# Patient Record
Sex: Female | Born: 1960
Health system: Southern US, Community
[De-identification: ages and names within clinical notes are randomized; demographics above are authoritative.]

## PROBLEM LIST (undated history)

## (undated) DIAGNOSIS — G9332 Myalgic encephalomyelitis/chronic fatigue syndrome: Secondary | ICD-10-CM

## (undated) DIAGNOSIS — M1990 Unspecified osteoarthritis, unspecified site: Secondary | ICD-10-CM

## (undated) DIAGNOSIS — K644 Residual hemorrhoidal skin tags: Secondary | ICD-10-CM

## (undated) DIAGNOSIS — K648 Other hemorrhoids: Secondary | ICD-10-CM

## (undated) DIAGNOSIS — G629 Polyneuropathy, unspecified: Secondary | ICD-10-CM

## (undated) DIAGNOSIS — M4800 Spinal stenosis, site unspecified: Secondary | ICD-10-CM

## (undated) DIAGNOSIS — K219 Gastro-esophageal reflux disease without esophagitis: Secondary | ICD-10-CM

## (undated) DIAGNOSIS — K5790 Diverticulosis of intestine, part unspecified, without perforation or abscess without bleeding: Secondary | ICD-10-CM

## (undated) DIAGNOSIS — F419 Anxiety disorder, unspecified: Secondary | ICD-10-CM

## (undated) DIAGNOSIS — I10 Essential (primary) hypertension: Secondary | ICD-10-CM

## (undated) DIAGNOSIS — F32A Depression, unspecified: Secondary | ICD-10-CM

## (undated) DIAGNOSIS — T7840XA Allergy, unspecified, initial encounter: Secondary | ICD-10-CM

## (undated) DIAGNOSIS — R5382 Chronic fatigue, unspecified: Secondary | ICD-10-CM

## (undated) HISTORY — PX: APPENDECTOMY: SHX54

## (undated) HISTORY — DX: Unspecified osteoarthritis, unspecified site: M19.90

## (undated) HISTORY — DX: Other hemorrhoids: K64.8

## (undated) HISTORY — DX: Residual hemorrhoidal skin tags: K64.4

## (undated) HISTORY — DX: Diverticulosis of intestine, part unspecified, without perforation or abscess without bleeding: K57.90

## (undated) HISTORY — DX: Anxiety disorder, unspecified: F41.9

## (undated) HISTORY — DX: Essential (primary) hypertension: I10

## (undated) HISTORY — DX: Myalgic encephalomyelitis/chronic fatigue syndrome: G93.32

## (undated) HISTORY — DX: Allergy, unspecified, initial encounter: T78.40XA

## (undated) HISTORY — DX: Spinal stenosis, site unspecified: M48.00

## (undated) HISTORY — DX: Gastro-esophageal reflux disease without esophagitis: K21.9

## (undated) HISTORY — DX: Chronic fatigue, unspecified: R53.82

## (undated) HISTORY — DX: Depression, unspecified: F32.A

---
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
## 1999-01-26 ENCOUNTER — Emergency Department (HOSPITAL_COMMUNITY): Admission: EM | Admit: 1999-01-26 | Discharge: 1999-01-26 | Payer: Self-pay | Admitting: Emergency Medicine

## 1999-10-12 ENCOUNTER — Other Ambulatory Visit: Admission: RE | Admit: 1999-10-12 | Discharge: 1999-10-12 | Payer: Self-pay | Admitting: *Deleted

## 2000-11-22 ENCOUNTER — Other Ambulatory Visit: Admission: RE | Admit: 2000-11-22 | Discharge: 2000-11-22 | Payer: Self-pay | Admitting: Obstetrics and Gynecology

## 2001-05-12 ENCOUNTER — Ambulatory Visit (HOSPITAL_COMMUNITY): Admission: RE | Admit: 2001-05-12 | Discharge: 2001-05-12 | Payer: Self-pay | Admitting: Family Medicine

## 2001-12-14 ENCOUNTER — Other Ambulatory Visit: Admission: RE | Admit: 2001-12-14 | Discharge: 2001-12-14 | Payer: Self-pay | Admitting: *Deleted

## 2003-01-24 ENCOUNTER — Other Ambulatory Visit: Admission: RE | Admit: 2003-01-24 | Discharge: 2003-01-24 | Payer: Self-pay | Admitting: Internal Medicine

## 2003-04-12 ENCOUNTER — Encounter: Payer: Self-pay | Admitting: Radiology

## 2003-04-12 ENCOUNTER — Encounter: Payer: Self-pay | Admitting: Orthopedic Surgery

## 2003-04-12 ENCOUNTER — Encounter: Admission: RE | Admit: 2003-04-12 | Discharge: 2003-04-12 | Payer: Self-pay | Admitting: Orthopedic Surgery

## 2003-04-29 ENCOUNTER — Encounter: Payer: Self-pay | Admitting: Orthopedic Surgery

## 2003-04-29 ENCOUNTER — Encounter: Admission: RE | Admit: 2003-04-29 | Discharge: 2003-04-29 | Payer: Self-pay | Admitting: Orthopedic Surgery

## 2004-01-30 ENCOUNTER — Other Ambulatory Visit: Admission: RE | Admit: 2004-01-30 | Discharge: 2004-01-30 | Payer: Self-pay | Admitting: Internal Medicine

## 2005-01-18 ENCOUNTER — Other Ambulatory Visit: Admission: RE | Admit: 2005-01-18 | Discharge: 2005-01-18 | Payer: Self-pay | Admitting: Internal Medicine

## 2008-10-23 ENCOUNTER — Encounter (INDEPENDENT_AMBULATORY_CARE_PROVIDER_SITE_OTHER): Payer: Self-pay | Admitting: *Deleted

## 2008-10-23 ENCOUNTER — Ambulatory Visit: Payer: Self-pay | Admitting: Internal Medicine

## 2008-10-23 DIAGNOSIS — K5900 Constipation, unspecified: Secondary | ICD-10-CM | POA: Insufficient documentation

## 2008-11-15 ENCOUNTER — Emergency Department (HOSPITAL_COMMUNITY): Admission: EM | Admit: 2008-11-15 | Discharge: 2008-11-15 | Payer: Self-pay | Admitting: Emergency Medicine

## 2008-11-18 ENCOUNTER — Encounter: Admission: RE | Admit: 2008-11-18 | Discharge: 2008-11-18 | Payer: Self-pay | Admitting: Family Medicine

## 2008-11-29 ENCOUNTER — Ambulatory Visit (HOSPITAL_COMMUNITY): Admission: RE | Admit: 2008-11-29 | Discharge: 2008-11-29 | Payer: Self-pay | Admitting: Neurology

## 2009-10-17 ENCOUNTER — Encounter: Admission: RE | Admit: 2009-10-17 | Discharge: 2009-10-17 | Payer: Self-pay | Admitting: Family Medicine

## 2009-11-28 ENCOUNTER — Encounter (INDEPENDENT_AMBULATORY_CARE_PROVIDER_SITE_OTHER): Payer: Self-pay | Admitting: *Deleted

## 2009-12-15 ENCOUNTER — Encounter (INDEPENDENT_AMBULATORY_CARE_PROVIDER_SITE_OTHER): Payer: Self-pay

## 2009-12-16 ENCOUNTER — Ambulatory Visit: Payer: Self-pay | Admitting: Internal Medicine

## 2009-12-24 ENCOUNTER — Ambulatory Visit: Payer: Self-pay | Admitting: Internal Medicine

## 2010-10-20 NOTE — Letter (Signed)
Summary: [facility] Instructions  [facility]  [address] [place], [place] [address]   Phone: [phone]  Fax: [phone]       TIAJUANA [name]    1961/01/29    MRN: [id number]        Procedure Day Dorna Bloom:  [facility]  12/24/09     Arrival Time:  10:30AM     Procedure Time:  11:30AM     Location of Procedure:                    _X _  [facility] (4th Floor)                        PREPARATION FOR COLONOSCOPY WITH MOVIPREP   Starting 5 days prior to your procedure 12/19/09 do not eat nuts, seeds, popcorn, corn, beans, peas,  salads, or any raw vegetables.  Do not take any fiber supplements (e.g. Metamucil, Citrucel, and Benefiber).  THE DAY BEFORE YOUR PROCEDURE         DATE: 12/23/09 DAY: TUESDAY  1.  Drink clear liquids the entire day-NO SOLID FOOD  2.  Do not drink anything colored red or purple.  Avoid juices with pulp.  No orange juice.  3.  Drink at least 64 oz. (8 glasses) of fluid/clear liquids during the day to prevent dehydration and help the prep work efficiently.  CLEAR LIQUIDS INCLUDE: Water Jello Ice Popsicles Tea (sugar ok, no milk/cream) Powdered fruit flavored drinks Coffee (sugar ok, no milk/cream) Gatorade Juice: apple, white grape, white cranberry  Lemonade Clear bullion, consomm, broth Carbonated beverages (any kind) Strained chicken noodle soup Hard Candy                             4.  In the morning, mix first dose of MoviPrep solution:    Empty 1 Pouch A and 1 Pouch B into the disposable container    Add lukewarm drinking water to the top line of the container. Mix to dissolve    Refrigerate (mixed solution should be used within 24 hrs)  5.  Begin drinking the prep at 5:00 p.m. The MoviPrep container is divided by 4 marks.   Every 15 minutes drink the solution down to the next mark (approximately 8 oz) until the full liter is complete.   6.  Follow completed prep with 16 oz of clear liquid of your choice (Nothing  red or purple).  Continue to drink clear liquids until bedtime.  7.  Before going to bed, mix second dose of MoviPrep solution:    Empty 1 Pouch A and 1 Pouch B into the disposable container    Add lukewarm drinking water to the top line of the container. Mix to dissolve    Refrigerate  THE DAY OF YOUR PROCEDURE      DATE: 12/24/09 DAY: WEDNESDAY  Beginning at 6:30AM (5 hours before procedure):         1. Every 15 minutes, drink the solution down to the next mark (approx 8 oz) until the full liter is complete.  2. Follow completed prep with 16 oz. of clear liquid of your choice.    3. You may drink clear liquids until 9:30AM (2 HOURS BEFORE PROCEDURE).   MEDICATION INSTRUCTIONS  Unless otherwise instructed, you should take regular prescription medications with a small sip of water   as early as possible the morning of your  procedure.    Additional medication instructions:Do not take diurectic-  HCTZ am of procedure.         OTHER INSTRUCTIONS  You will need a responsible adult at least 52 years of age to accompany you and drive you home.   This person must remain in the waiting room during your procedure.  Wear loose fitting clothing that is easily removed.  Leave jewelry and other valuables at home.  However, you may wish to bring a book to read or  an iPod/MP3 player to listen to music as you wait for your procedure to start.  Remove all body piercing jewelry and leave at home.  Total time from sign-in until discharge is approximately 2-3 hours.  You should go home directly after your procedure and rest.  You can resume normal activities the  day after your procedure.  The day of your procedure you should not:   Drive   Make legal decisions   Operate machinery   Drink alcohol   Return to work  You will receive specific instructions about eating, activities and medications before you leave.    The above instructions have been reviewed and explained to  me by   Ulis Rias RN  December 16, 2009 9:02 AM     I fully understand and can verbalize these instructions _____________________________ Date _________

## 2010-10-20 NOTE — Letter (Signed)
Summary: OSP Based Product Consent Form  [facility]  [address] [place], [place] [address]   Phone: [phone]  Fax: [phone]      October 23, 2008 MRN: [id number] Tammy [name]  [facility]  OSP BASED PRODUCT CONSENT FORM  I understand that in order to prepare myself for an examination of my large bowel during a colonoscpoy I must undergo a bowel cleansing prior to the procedure. This is to ensure that my bowel is as clean as possible so the physician performing the procedure can thoroughly examine the lining of my colon to detect polyps or other abnormalities.  I have beeen informed that the FDA has become aware of reports of acute phosphate nephropathy, a type of acute kidney injury, associated with the use of oral sodium phosphate based products (OPS) for bowel cleansing prior to colonoscopy and other procedures. These rare but serious events have occurred in some patients wothout identifible factors that would put them at risk for developing acute kidney injury. These products include the prescription products, OsmoPrep and Visicol, as well as previously available over-the-counter laxatives (e.g., Fleet Phospho-soda).  Prior caution should be taken in patients with a history of renal insufficiency, advanced age, or those taking diuretics or certain blood pressure medications. The use of OSP is contraindicated in patients with advanced renal disease or a history of heart failure.  Alternative preparations for bowel cleansing recommended by my physician include: MoviPrep, Golytely, HalfLytely, Miralax, or other PEG (polyethylene glycol) based products which, to date, have not been associated with these types od adverse events.  The above risks and alternatives have been fully expained to me and I am aware that by taking OsmoPrep, or like products, an acute kidney injury may occur without warning. Despite this warning, I have chosen to use a phosphate-based  regime for bowel cleansing prior to my colonoscopy and will assume responsibility for any adverse effect that I may experience from this product. I also understand the importance of vigorous hydration before and immediately following my procedure.     Instructed by: Francee Piccolo, CMA     Signed:________________________________________   Date:_______________   Original 11/21/07  Appended Document: OSP Based Product Consent Form Pt decided not to use OsmoPrep for her colon prep.  Consent not signed and original discarded in shredder.

## 2010-10-20 NOTE — Miscellaneous (Signed)
Summary: Lec previsit  Clinical Lists Changes  Medications: Added new medication of MOVIPREP 100 GM  SOLR (PEG-KCL-NACL-NASULF-NA ASC-C) As per prep instructions. - Signed Rx of MOVIPREP 100 GM  SOLR (PEG-KCL-NACL-NASULF-NA ASC-C) As per prep instructions.;  #1 x 0;  Signed;  Entered by: Ulis Rias RN;  Authorized by: Iva Boop MD, Clementeen Graham;  Method used: Electronically to [facility] [address], [address] Shipping/mailing, [place], [place]  [address], Ph: [phone], Fax: [phone] Observations: Added new observation of NKA: T (12/16/2009 8:49)    Prescriptions: MOVIPREP 100 GM  SOLR (PEG-KCL-NACL-NASULF-NA ASC-C) As per prep instructions.  #1 x 0   Entered by:   Ulis Rias RN   Authorized by:   Iva Boop MD, [facility]   Signed by:   Ulis Rias RN on 12/16/2009   Method used:   Electronically to        [facility]* (retail)       [address]       [address] Shipping/mailing       [place], [place]  [address]       Ph: [phone]       Fax: [phone]   RxID:   [id number]

## 2010-10-20 NOTE — Procedures (Signed)
Summary: Colonoscopy  Patient: [name] Note: All result statuses are Final unless otherwise noted.  Tests: (1) Colonoscopy (COL)   COL Colonoscopy           DONE     [facility]     [address]     [place], [place]  [address]           COLONOSCOPY PROCEDURE REPORT           PATIENT:  Tammy [name], Tammy [name]  MR#:  [id number]     BIRTHDATE:  08-07-1961, 48 yrs. old  GENDER:  female     ENDOSCOPIST:  Iva Boop, MD, [facility]     REF. BY:  Herb Grays, M.D.     PROCEDURE DATE:  12/24/2009     PROCEDURE:  Colonoscopy 06[address]     ASA CLASS:  Class I     INDICATIONS:  Routine Risk Screening She did have a diminutive     rectalpolyp removed in 2006, no records - apparently not     precancerous     MEDICATIONS:   Fentanyl 50 mcg IV, Versed 6 mg IV           DESCRIPTION OF PROCEDURE:   After the risks benefits and     alternatives of the procedure were thoroughly explained, informed     consent was obtained.  Digital rectal exam was performed and     revealed no abnormalities.   The LB CF-[id number] endoscope     was introduced through the anus and advanced to the cecum, which     was identified by both the appendix and ileocecal valve, without     limitations.  The quality of the prep was excellent, using     MoviPrep.  The instrument was then slowly withdrawn as the colon     was fully examined.     Insertion: 5:16 minutes Withdrawal: 7:48 minutes     <<PROCEDUREIMAGES>>           FINDINGS:  A normal appearing cecum, ileocecal valve, and     appendiceal orifice were identified. The ascending, hepatic     flexure, transverse, splenic flexure, descending, sigmoid colon,     and rectum appeared unremarkable.   Retroflexed views in the     rectum revealed no abnormalities.    The scope was then withdrawn     from the patient and the procedure completed.           COMPLICATIONS:  None     ENDOSCOPIC IMPRESSION:     1) Normal colonoscopy - excellent prep           REPEAT  EXAM:  In 10 year(s) for routine screening colonocsopy.           Iva Boop, MD, Clementeen Graham           CC:  Herb Grays, MD     The Patient           n.     Rosalie Doctor:   Iva Boop at 12/24/2009 12:11 PM           [place], Camelia Eng, [id number]  Note: An exclamation mark (!) indicates a result that was not dispersed into the flowsheet. Document Creation Date: 12/24/2009 12:12 PM _______________________________________________________________________  (1) Order result status: Final Collection or observation date-time: 12/24/2009 12:00 Requested date-time:  Receipt date-time:  Reported date-time:  Referring Physician:   Ordering Physician: Stan Head [phone]) Specimen Source:  Source: Kem Parkinson  Filler Order Number: [id number] Lab site:   Appended Document: Colonoscopy    Clinical Lists Changes  Observations: Added new observation of COLONNXTDUE: 12/2019 (12/24/2009 14:50)

## 2010-10-20 NOTE — Letter (Signed)
Summary: Previsit letter  [facility]  [address] [place], [place] [address]   Phone: [phone]  Fax: [phone]       11/28/2009 MRN: [id number]  Tammy [name] [address] [place], [place]  [address]  Dear Tammy [name],  Welcome to the Gastroenterology Division at [facility].    You are scheduled to see a nurse for your pre-procedure visit on 12-16-09 at 8:30a.m. on the 3rd floor at [facility], [address]  We ask that you try to arrive at our office 15 minutes prior to your appointment time to allow for check-in.  Your nurse visit will consist of discussing your medical and surgical history, your immediate family medical history, and your medications.    Please bring a complete list of all your medications or, if you prefer, bring the medication bottles and we will list them.  We will need to be aware of both prescribed and over the counter drugs.  We will need to know exact dosage information as well.  If you are on blood thinners (Coumadin, Plavix, Aggrenox, Ticlid, etc.) please call our office today/prior to your appointment, as we need to consult with your physician about holding your medication.   Please be prepared to read and sign documents such as consent forms, a financial agreement, and acknowledgement forms.  If necessary, and with your consent, a friend or relative is welcome to sit-in on the nurse visit with you.  Please bring your insurance card so that we may make a copy of it.  If your insurance requires a referral to see a specialist, please bring your referral form from your primary care physician.  No co-pay is required for this nurse visit.     If you cannot keep your appointment, please call [phone] to cancel or reschedule prior to your appointment date.  This allows [place] the opportunity to schedule an appointment for another patient in need of care.    Thank you for choosing [facility] for your medical  needs.  We appreciate the opportunity to care for you.  Please visit [place] at our website  to learn more about our practice.                     Sincerely.                                                                                                                   The Gastroenterology Division

## 2010-10-20 NOTE — Assessment & Plan Note (Signed)
Summary: CONSTIPATION/WANTS A COL...AS.   History of Present Illness Visit Type: new patient Primary GI MD: Stan Head MD [facility] Primary Provider: Reed Breech, MD Chief Complaint: colon cancer screening History of Present Illness:   50 yo white woman with chronic constipation that probbably worsened a bit when on Aleve recently (after dental surgery). Uses glycerin suppository every other day with some relief. Not really a new problem. HHad a colonoscopy > 5 yrs ago and polyps removed but she indicates not precancerous. All other GI ROS negative.            Prior Medications Reviewed Using: Patient Recall  Updated Prior Medication List: HYDROCHLOROTHIAZIDE 25 MG TABS (HYDROCHLOROTHIAZIDE) once daily AMBIEN 5 MG TABS (ZOLPIDEM TARTRATE) at bedtime  Current Allergies: No known allergies  Past Medical History:    Depression    Hyperlipidemia    Urinary Tract Infection  Past Surgical History:    Tonsillectomy   Family History:    Family History of Colon Polyps:FAther    Family History of Diabetes: Grandmother  Social History:    Occupation: Child psychotherapist, [employer]    Husband recently unemployed and there are financial stresses    1 son 1 daughter     Patient has never smoked.     Alcohol Use - no    Illicit Drug Use - no    Patient gets regular exercise.  Risk Factors:  Tobacco use:  never Drug use:  no Alcohol use:  no Exercise:  yes  Review of Systems       The patient complains of sleeping problems.    Vital Signs:  Patient Profile:   50 Years Old Female Height:     63 inches Weight:      177.50 pounds                  Physical Exam  General:     Well developed, well nourished, no acute distress. Neck:     Supple; no masses or thyromegaly. Lungs:     Clear throughout to auscultation. Heart:     Regular rate and rhythm; no murmurs, rubs,  or bruits. Abdomen:     Soft, nontender and nondistended. No masses, hepatosplenomegaly or hernias  noted. Normal bowel sounds. Rectal:     deferred until time of colonoscopy.   Neurologic:     Alert and  oriented x4;     Impression & Recommendations:  Problem # 1:  SCREENING COLORECTAL-CANCER (ICD-V76.51) Risks, benefits,and indications of endoscopic procedure(s) were reviewed with the patient and all questions answered.  Orders: Colonoscopy (Colon)   Problem # 2:  CONSTIPATION (ICD-564.00) Assessment: Unchanged Chronic, glycerine suppositories work but to try MiraLax   Patient Instructions: 1)  Try MiraLax daily for chronic constipation. 2)  Mix it well, stirrng x 1 minute. 3)  Do not take HCTZ for 5 days prior to colonoscopy if you use OsmoPrep.  Prescriptions: DULCOLAX 5 MG  TBEC (BISACODYL) Day before procedure take 2 at 3pm and 2 at 8pm.  #4 x 0   Entered by:   Francee Piccolo CMA   Authorized by:   Iva Boop MD   Signed by:   Francee Piccolo CMA on 10/23/2008   Method used:   Electronically to        [facility] (retail)       [address]       [place], [place]  [address]  Ph: [phone]       Fax: [phone]   RxID:   [id number] METOCLOPRAMIDE HCL 10 MG  TABS (METOCLOPRAMIDE HCL) As per prep instructions.  #2 x 0   Entered by:   Francee Piccolo CMA   Authorized by:   Iva Boop MD   Signed by:   Francee Piccolo CMA on 10/23/2008   Method used:   Electronically to        [facility] (retail)       [address]       [place], [place]  [address]       Ph: [phone]       Fax: [phone]   RxID:   [id number] MIRALAX   POWD (POLYETHYLENE GLYCOL 3350) As per prep  instructions.  #255gm x 0   Entered by:   Francee Piccolo CMA   Authorized by:   Iva Boop MD   Signed by:   Francee Piccolo CMA on 10/23/2008   Method used:   Electronically to        [facility] ([facility])       [address]       [place], [place]  [address]       Ph: [phone]       Fax: [phone]   RxID:   [id number]

## 2010-12-08 ENCOUNTER — Other Ambulatory Visit: Payer: Self-pay | Admitting: Obstetrics and Gynecology

## 2010-12-08 DIAGNOSIS — Z1231 Encounter for screening mammogram for malignant neoplasm of breast: Secondary | ICD-10-CM

## 2010-12-09 ENCOUNTER — Other Ambulatory Visit (HOSPITAL_COMMUNITY): Payer: Self-pay | Admitting: Neurological Surgery

## 2010-12-09 DIAGNOSIS — R200 Anesthesia of skin: Secondary | ICD-10-CM

## 2010-12-09 DIAGNOSIS — IMO0002 Reserved for concepts with insufficient information to code with codable children: Secondary | ICD-10-CM

## 2010-12-11 ENCOUNTER — Ambulatory Visit: Payer: Self-pay

## 2010-12-16 ENCOUNTER — Other Ambulatory Visit (HOSPITAL_COMMUNITY): Payer: Self-pay

## 2010-12-17 ENCOUNTER — Ambulatory Visit (HOSPITAL_COMMUNITY)
Admission: RE | Admit: 2010-12-17 | Discharge: 2010-12-17 | Disposition: A | Discharge status: Home / Self Care | Payer: 59 | Source: Ambulatory Visit | Attending: Neurological Surgery | Admitting: Neurological Surgery

## 2010-12-17 DIAGNOSIS — M47814 Spondylosis without myelopathy or radiculopathy, thoracic region: Secondary | ICD-10-CM | POA: Insufficient documentation

## 2010-12-17 DIAGNOSIS — M5030 Other cervical disc degeneration, unspecified cervical region: Secondary | ICD-10-CM | POA: Insufficient documentation

## 2010-12-17 DIAGNOSIS — M47812 Spondylosis without myelopathy or radiculopathy, cervical region: Secondary | ICD-10-CM | POA: Insufficient documentation

## 2010-12-17 DIAGNOSIS — R200 Anesthesia of skin: Secondary | ICD-10-CM

## 2010-12-17 DIAGNOSIS — R51 Headache: Secondary | ICD-10-CM | POA: Insufficient documentation

## 2010-12-17 DIAGNOSIS — IMO0002 Reserved for concepts with insufficient information to code with codable children: Secondary | ICD-10-CM | POA: Insufficient documentation

## 2010-12-17 MED ORDER — GADOBENATE DIMEGLUMINE 529 MG/ML IV SOLN
13.0000 mL | Freq: Once | INTRAVENOUS | Status: AC
  Administered 2010-12-17: 13 mL via INTRAVENOUS

## 2010-12-21 ENCOUNTER — Ambulatory Visit: Payer: Self-pay

## 2011-01-05 LAB — CBC
HCT: 39.9 % (ref 36.0–46.0)
Hemoglobin: 13.6 g/dL (ref 12.0–15.0)
MCHC: 34.2 g/dL (ref 30.0–36.0)
MCV: 83 fL (ref 78.0–100.0)
Platelets: 217 10*3/uL (ref 150–400)
RBC: 4.81 MIL/uL (ref 3.87–5.11)
RDW: 13.1 % (ref 11.5–15.5)
WBC: 5.5 10*3/uL (ref 4.0–10.5)

## 2011-01-05 LAB — DIFFERENTIAL
Basophils Absolute: 0 10*3/uL (ref 0.0–0.1)
Basophils Relative: 0 % (ref 0–1)
Eosinophils Absolute: 0.1 10*3/uL (ref 0.0–0.7)
Eosinophils Relative: 1 % (ref 0–5)
Lymphocytes Relative: 17 % (ref 12–46)
Lymphs Abs: 1 10*3/uL (ref 0.7–4.0)
Monocytes Absolute: 0.4 10*3/uL (ref 0.1–1.0)
Monocytes Relative: 8 % (ref 3–12)
Neutro Abs: 4 10*3/uL (ref 1.7–7.7)
Neutrophils Relative %: 74 % (ref 43–77)

## 2011-01-05 LAB — COMPREHENSIVE METABOLIC PANEL
ALT: 18 U/L (ref 0–35)
AST: 20 U/L (ref 0–37)
Albumin: 4.5 g/dL (ref 3.5–5.2)
Alkaline Phosphatase: 39 U/L (ref 39–117)
BUN: 11 mg/dL (ref 6–23)
CO2: 25 mEq/L (ref 19–32)
Calcium: 9.4 mg/dL (ref 8.4–10.5)
Chloride: 100 mEq/L (ref 96–112)
Creatinine, Ser: 0.72 mg/dL (ref 0.4–1.2)
GFR calc Af Amer: >60 mL/min (ref >60)
GFR calc non Af Amer: >60 mL/min (ref >60)
Glucose, Bld: 92 mg/dL (ref 70–99)
Potassium: 3.2 mEq/L — ABNORMAL LOW (ref 3.5–5.1)
Sodium: 136 mEq/L (ref 135–145)
Total Bilirubin: 0.9 mg/dL (ref 0.3–1.2)
Total Protein: 6.4 g/dL (ref 6.0–8.3)

## 2011-01-21 ENCOUNTER — Ambulatory Visit: Payer: Commercial Managed Care - PPO

## 2011-01-26 ENCOUNTER — Ambulatory Visit
Admission: RE | Admit: 2011-01-26 | Discharge: 2011-01-26 | Disposition: A | Discharge status: Home / Self Care | Payer: 59 | Source: Ambulatory Visit | Attending: Obstetrics and Gynecology | Admitting: Obstetrics and Gynecology

## 2011-01-26 DIAGNOSIS — Z1231 Encounter for screening mammogram for malignant neoplasm of breast: Secondary | ICD-10-CM

## 2011-01-27 ENCOUNTER — Other Ambulatory Visit: Payer: Self-pay | Admitting: Obstetrics and Gynecology

## 2011-01-27 DIAGNOSIS — R928 Other abnormal and inconclusive findings on diagnostic imaging of breast: Secondary | ICD-10-CM

## 2011-01-29 ENCOUNTER — Ambulatory Visit
Admission: RE | Admit: 2011-01-29 | Discharge: 2011-01-29 | Disposition: A | Discharge status: Home / Self Care | Payer: 59 | Source: Ambulatory Visit | Attending: Obstetrics and Gynecology | Admitting: Obstetrics and Gynecology

## 2011-01-29 DIAGNOSIS — R928 Other abnormal and inconclusive findings on diagnostic imaging of breast: Secondary | ICD-10-CM

## 2011-11-02 ENCOUNTER — Other Ambulatory Visit (HOSPITAL_COMMUNITY): Payer: Self-pay | Admitting: Neurological Surgery

## 2011-11-02 DIAGNOSIS — M47816 Spondylosis without myelopathy or radiculopathy, lumbar region: Secondary | ICD-10-CM

## 2011-11-02 DIAGNOSIS — M47814 Spondylosis without myelopathy or radiculopathy, thoracic region: Secondary | ICD-10-CM

## 2011-11-05 ENCOUNTER — Ambulatory Visit (HOSPITAL_COMMUNITY): Admission: RE | Admit: 2011-11-05 | Payer: 59 | Source: Ambulatory Visit

## 2011-11-05 ENCOUNTER — Ambulatory Visit (HOSPITAL_COMMUNITY)
Admission: RE | Admit: 2011-11-05 | Discharge: 2011-11-05 | Disposition: A | Discharge status: Home / Self Care | Payer: 59 | Source: Ambulatory Visit | Attending: Neurological Surgery | Admitting: Neurological Surgery

## 2011-11-05 DIAGNOSIS — M25519 Pain in unspecified shoulder: Secondary | ICD-10-CM | POA: Insufficient documentation

## 2011-11-05 DIAGNOSIS — M79609 Pain in unspecified limb: Secondary | ICD-10-CM | POA: Insufficient documentation

## 2011-11-05 DIAGNOSIS — R209 Unspecified disturbances of skin sensation: Secondary | ICD-10-CM | POA: Insufficient documentation

## 2011-11-05 DIAGNOSIS — M4804 Spinal stenosis, thoracic region: Secondary | ICD-10-CM | POA: Insufficient documentation

## 2011-11-05 DIAGNOSIS — M47816 Spondylosis without myelopathy or radiculopathy, lumbar region: Secondary | ICD-10-CM

## 2011-11-05 DIAGNOSIS — M47817 Spondylosis without myelopathy or radiculopathy, lumbosacral region: Secondary | ICD-10-CM | POA: Insufficient documentation

## 2011-11-05 DIAGNOSIS — M47814 Spondylosis without myelopathy or radiculopathy, thoracic region: Secondary | ICD-10-CM

## 2011-11-05 DIAGNOSIS — M519 Unspecified thoracic, thoracolumbar and lumbosacral intervertebral disc disorder: Secondary | ICD-10-CM | POA: Insufficient documentation

## 2012-03-17 ENCOUNTER — Other Ambulatory Visit (HOSPITAL_COMMUNITY)
Admission: RE | Admit: 2012-03-17 | Discharge: 2012-03-17 | Disposition: A | Discharge status: Home / Self Care | Payer: 59 | Source: Ambulatory Visit | Attending: Internal Medicine | Admitting: Internal Medicine

## 2012-03-17 ENCOUNTER — Other Ambulatory Visit: Payer: Self-pay | Admitting: Internal Medicine

## 2012-03-17 DIAGNOSIS — N6311 Unspecified lump in the right breast, upper outer quadrant: Secondary | ICD-10-CM

## 2012-03-17 DIAGNOSIS — Z01419 Encounter for gynecological examination (general) (routine) without abnormal findings: Secondary | ICD-10-CM | POA: Insufficient documentation

## 2012-03-24 ENCOUNTER — Ambulatory Visit
Admission: RE | Admit: 2012-03-24 | Discharge: 2012-03-24 | Disposition: A | Discharge status: Home / Self Care | Payer: 59 | Source: Ambulatory Visit | Attending: Internal Medicine | Admitting: Internal Medicine

## 2012-03-24 DIAGNOSIS — N6311 Unspecified lump in the right breast, upper outer quadrant: Secondary | ICD-10-CM

## 2012-04-04 ENCOUNTER — Other Ambulatory Visit (HOSPITAL_COMMUNITY): Payer: Self-pay | Admitting: Orthopedic Surgery

## 2012-04-04 DIAGNOSIS — M542 Cervicalgia: Secondary | ICD-10-CM

## 2012-04-06 ENCOUNTER — Ambulatory Visit (HOSPITAL_COMMUNITY)
Admission: RE | Admit: 2012-04-06 | Discharge: 2012-04-06 | Disposition: A | Discharge status: Home / Self Care | Payer: 59 | Source: Ambulatory Visit | Attending: Orthopedic Surgery | Admitting: Orthopedic Surgery

## 2012-04-06 DIAGNOSIS — M542 Cervicalgia: Secondary | ICD-10-CM | POA: Insufficient documentation

## 2012-04-06 DIAGNOSIS — M79609 Pain in unspecified limb: Secondary | ICD-10-CM | POA: Insufficient documentation

## 2012-04-21 ENCOUNTER — Ambulatory Visit: Payer: 59 | Attending: Orthopedic Surgery | Admitting: Physical Therapy

## 2012-04-21 DIAGNOSIS — IMO0001 Reserved for inherently not codable concepts without codable children: Secondary | ICD-10-CM | POA: Insufficient documentation

## 2012-04-21 DIAGNOSIS — M6281 Muscle weakness (generalized): Secondary | ICD-10-CM | POA: Insufficient documentation

## 2012-04-21 DIAGNOSIS — M542 Cervicalgia: Secondary | ICD-10-CM | POA: Insufficient documentation

## 2012-04-21 DIAGNOSIS — M25519 Pain in unspecified shoulder: Secondary | ICD-10-CM | POA: Insufficient documentation

## 2012-04-26 ENCOUNTER — Ambulatory Visit: Payer: 59 | Admitting: Physical Therapy

## 2012-05-03 ENCOUNTER — Ambulatory Visit: Payer: 59 | Admitting: Physical Therapy

## 2012-05-05 ENCOUNTER — Ambulatory Visit: Payer: 59 | Admitting: Physical Therapy

## 2012-05-10 ENCOUNTER — Ambulatory Visit: Payer: 59 | Admitting: Physical Therapy

## 2012-05-11 ENCOUNTER — Ambulatory Visit: Payer: 59

## 2012-05-15 ENCOUNTER — Ambulatory Visit: Payer: 59

## 2012-05-17 ENCOUNTER — Ambulatory Visit: Payer: 59 | Admitting: Physical Therapy

## 2012-05-17 ENCOUNTER — Encounter: Payer: 59 | Admitting: Physical Therapy

## 2012-05-25 ENCOUNTER — Ambulatory Visit: Payer: 59 | Attending: Orthopedic Surgery

## 2012-05-25 DIAGNOSIS — M25519 Pain in unspecified shoulder: Secondary | ICD-10-CM | POA: Insufficient documentation

## 2012-05-25 DIAGNOSIS — IMO0001 Reserved for inherently not codable concepts without codable children: Secondary | ICD-10-CM | POA: Insufficient documentation

## 2012-05-25 DIAGNOSIS — M6281 Muscle weakness (generalized): Secondary | ICD-10-CM | POA: Insufficient documentation

## 2012-05-25 DIAGNOSIS — M542 Cervicalgia: Secondary | ICD-10-CM | POA: Insufficient documentation

## 2012-05-29 ENCOUNTER — Ambulatory Visit: Payer: 59

## 2012-05-31 ENCOUNTER — Ambulatory Visit: Payer: 59 | Admitting: Physical Therapy

## 2012-06-05 ENCOUNTER — Ambulatory Visit: Payer: 59

## 2012-06-07 ENCOUNTER — Ambulatory Visit: Payer: 59

## 2012-06-12 ENCOUNTER — Ambulatory Visit: Payer: 59

## 2012-06-14 ENCOUNTER — Ambulatory Visit: Payer: 59 | Admitting: Physical Therapy

## 2012-06-19 ENCOUNTER — Ambulatory Visit: Payer: 59

## 2012-06-21 ENCOUNTER — Ambulatory Visit: Payer: 59 | Attending: Orthopedic Surgery

## 2012-06-21 DIAGNOSIS — IMO0001 Reserved for inherently not codable concepts without codable children: Secondary | ICD-10-CM | POA: Insufficient documentation

## 2012-06-21 DIAGNOSIS — M25519 Pain in unspecified shoulder: Secondary | ICD-10-CM | POA: Insufficient documentation

## 2012-06-21 DIAGNOSIS — M6281 Muscle weakness (generalized): Secondary | ICD-10-CM | POA: Insufficient documentation

## 2012-06-21 DIAGNOSIS — M542 Cervicalgia: Secondary | ICD-10-CM | POA: Insufficient documentation

## 2012-06-28 ENCOUNTER — Ambulatory Visit: Payer: 59

## 2012-07-05 ENCOUNTER — Ambulatory Visit: Payer: 59 | Admitting: Physical Therapy

## 2012-07-12 ENCOUNTER — Ambulatory Visit: Payer: 59 | Admitting: Physical Therapy

## 2012-07-12 ENCOUNTER — Other Ambulatory Visit: Payer: Self-pay | Admitting: Orthopedic Surgery

## 2012-07-12 ENCOUNTER — Ambulatory Visit: Payer: 59

## 2012-07-12 DIAGNOSIS — R531 Weakness: Secondary | ICD-10-CM

## 2012-07-12 DIAGNOSIS — M4800 Spinal stenosis, site unspecified: Secondary | ICD-10-CM

## 2012-07-19 ENCOUNTER — Ambulatory Visit
Admission: RE | Admit: 2012-07-19 | Discharge: 2012-07-19 | Disposition: A | Discharge status: Home / Self Care | Payer: 59 | Source: Ambulatory Visit | Attending: Orthopedic Surgery | Admitting: Orthopedic Surgery

## 2012-07-19 ENCOUNTER — Ambulatory Visit: Payer: 59 | Admitting: Physical Therapy

## 2012-07-19 DIAGNOSIS — M4800 Spinal stenosis, site unspecified: Secondary | ICD-10-CM

## 2012-07-19 DIAGNOSIS — R531 Weakness: Secondary | ICD-10-CM

## 2012-07-19 MED ORDER — IOHEXOL 300 MG/ML  SOLN
1.0000 mL | Freq: Once | INTRAMUSCULAR | Status: AC | PRN
  Administered 2012-07-19: 1 mL via EPIDURAL

## 2012-07-19 MED ORDER — TRIAMCINOLONE ACETONIDE 40 MG/ML IJ SUSP (RADIOLOGY)
60.0000 mg | Freq: Once | INTRAMUSCULAR | Status: AC
  Administered 2012-07-19: 60 mg via EPIDURAL

## 2012-07-24 ENCOUNTER — Other Ambulatory Visit: Payer: Self-pay | Admitting: Orthopedic Surgery

## 2012-07-24 DIAGNOSIS — M549 Dorsalgia, unspecified: Secondary | ICD-10-CM

## 2012-07-26 ENCOUNTER — Ambulatory Visit: Payer: 59

## 2012-08-09 ENCOUNTER — Ambulatory Visit
Admission: RE | Admit: 2012-08-09 | Discharge: 2012-08-09 | Disposition: A | Discharge status: Home / Self Care | Payer: 59 | Source: Ambulatory Visit | Attending: Orthopedic Surgery | Admitting: Orthopedic Surgery

## 2012-08-09 DIAGNOSIS — M549 Dorsalgia, unspecified: Secondary | ICD-10-CM

## 2012-08-09 MED ORDER — METHYLPREDNISOLONE ACETATE 40 MG/ML INJ SUSP (RADIOLOG
120.0000 mg | Freq: Once | INTRAMUSCULAR | Status: AC
  Administered 2012-08-09: 120 mg via EPIDURAL

## 2012-08-09 MED ORDER — IOHEXOL 180 MG/ML  SOLN
1.0000 mL | Freq: Once | INTRAMUSCULAR | Status: AC | PRN
  Administered 2012-08-09: 1 mL via EPIDURAL

## 2012-08-16 ENCOUNTER — Telehealth: Payer: Self-pay | Admitting: Radiology

## 2012-08-16 NOTE — Telephone Encounter (Signed)
Explained twitching in her legs was not a usual side effect of the steroid injections and could sometimes be associated with imbalances of K+, sodium or even calcium. Pt states she was just put on K+ tablets. I asked her to call the ordering physician about her symptoms.   dd

## 2012-09-17 ENCOUNTER — Emergency Department (HOSPITAL_COMMUNITY): Payer: 59

## 2012-09-17 ENCOUNTER — Encounter (HOSPITAL_COMMUNITY): Payer: Self-pay | Admitting: Family Medicine

## 2012-09-17 ENCOUNTER — Observation Stay (HOSPITAL_COMMUNITY)
Admission: EM | Admit: 2012-09-17 | Discharge: 2012-09-19 | Disposition: A | Discharge status: Home / Self Care | Payer: 59 | Attending: Internal Medicine | Admitting: Internal Medicine

## 2012-09-17 DIAGNOSIS — K5900 Constipation, unspecified: Secondary | ICD-10-CM

## 2012-09-17 DIAGNOSIS — E876 Hypokalemia: Secondary | ICD-10-CM | POA: Insufficient documentation

## 2012-09-17 DIAGNOSIS — R0989 Other specified symptoms and signs involving the circulatory and respiratory systems: Secondary | ICD-10-CM | POA: Insufficient documentation

## 2012-09-17 DIAGNOSIS — E785 Hyperlipidemia, unspecified: Secondary | ICD-10-CM | POA: Insufficient documentation

## 2012-09-17 DIAGNOSIS — G589 Mononeuropathy, unspecified: Secondary | ICD-10-CM | POA: Insufficient documentation

## 2012-09-17 DIAGNOSIS — I10 Essential (primary) hypertension: Secondary | ICD-10-CM | POA: Insufficient documentation

## 2012-09-17 DIAGNOSIS — R0609 Other forms of dyspnea: Secondary | ICD-10-CM | POA: Insufficient documentation

## 2012-09-17 DIAGNOSIS — R079 Chest pain, unspecified: Principal | ICD-10-CM | POA: Diagnosis present

## 2012-09-17 DIAGNOSIS — G629 Polyneuropathy, unspecified: Secondary | ICD-10-CM

## 2012-09-17 HISTORY — DX: Polyneuropathy, unspecified: G62.9

## 2012-09-17 LAB — CBC WITH DIFFERENTIAL/PLATELET
Basophils Absolute: 0 10*3/uL (ref 0.0–0.1)
Basophils Relative: 1 % (ref 0–1)
Eosinophils Absolute: 0.1 10*3/uL (ref 0.0–0.7)
Eosinophils Relative: 2 % (ref 0–5)
HCT: 39 % (ref 36.0–46.0)
Hemoglobin: 12.9 g/dL (ref 12.0–15.0)
Lymphocytes Relative: 18 % (ref 12–46)
Lymphs Abs: 0.7 10*3/uL (ref 0.7–4.0)
MCH: 27.7 pg (ref 26.0–34.0)
MCHC: 33.1 g/dL (ref 30.0–36.0)
MCV: 83.9 fL (ref 78.0–100.0)
Monocytes Absolute: 0.2 10*3/uL (ref 0.1–1.0)
Monocytes Relative: 6 % (ref 3–12)
Neutro Abs: 3 10*3/uL (ref 1.7–7.7)
Neutrophils Relative %: 74 % (ref 43–77)
Platelets: 196 10*3/uL (ref 150–400)
RBC: 4.65 MIL/uL (ref 3.87–5.11)
RDW: 13 % (ref 11.5–15.5)
WBC: 4.1 10*3/uL (ref 4.0–10.5)

## 2012-09-17 LAB — CK: Total CK: 39 U/L (ref 7–177)

## 2012-09-17 LAB — URINALYSIS, ROUTINE W REFLEX MICROSCOPIC
Bilirubin Urine: NEGATIVE
Glucose, UA: NEGATIVE mg/dL
Hgb urine dipstick: NEGATIVE
Ketones, ur: NEGATIVE mg/dL
Nitrite: NEGATIVE
Protein, ur: NEGATIVE mg/dL
Specific Gravity, Urine: 1.006 (ref 1.005–1.030)
Urobilinogen, UA: 0.2 mg/dL (ref 0.0–1.0)
pH: 7.5 (ref 5.0–8.0)

## 2012-09-17 LAB — COMPREHENSIVE METABOLIC PANEL
ALT: 16 U/L (ref 0–35)
AST: 20 U/L (ref 0–37)
Albumin: 4.2 g/dL (ref 3.5–5.2)
Alkaline Phosphatase: 45 U/L (ref 39–117)
BUN: 12 mg/dL (ref 6–23)
CO2: 26 mEq/L (ref 19–32)
Calcium: 10 mg/dL (ref 8.4–10.5)
Chloride: 97 mEq/L (ref 96–112)
Creatinine, Ser: 0.67 mg/dL (ref 0.50–1.10)
GFR calc Af Amer: >90 mL/min (ref >90)
GFR calc non Af Amer: >90 mL/min (ref >90)
Glucose, Bld: 142 mg/dL — ABNORMAL HIGH (ref 70–99)
Potassium: 2.8 mEq/L — ABNORMAL LOW (ref 3.5–5.1)
Sodium: 137 mEq/L (ref 135–145)
Total Bilirubin: 0.3 mg/dL (ref 0.3–1.2)
Total Protein: 7 g/dL (ref 6.0–8.3)

## 2012-09-17 LAB — D-DIMER, QUANTITATIVE: D-Dimer, Quant: 0.27 ug/mL-FEU (ref 0.00–0.48)

## 2012-09-17 LAB — POCT I-STAT TROPONIN I: Troponin i, poc: 0 ng/mL (ref 0.00–0.08)

## 2012-09-17 LAB — URINE MICROSCOPIC-ADD ON

## 2012-09-17 MED ORDER — POTASSIUM CHLORIDE CRYS ER 20 MEQ PO TBCR
40.0000 meq | EXTENDED_RELEASE_TABLET | Freq: Once | ORAL | Status: AC
  Administered 2012-09-17: 40 meq via ORAL
  Filled 2012-09-17: qty 2

## 2012-09-17 MED ORDER — SODIUM CHLORIDE 0.9 % IJ SOLN
3.0000 mL | Freq: Two times a day (BID) | INTRAMUSCULAR | Status: DC
  Administered 2012-09-18 (×2): 3 mL via INTRAVENOUS

## 2012-09-17 MED ORDER — SODIUM CHLORIDE 0.9 % IJ SOLN
3.0000 mL | Freq: Two times a day (BID) | INTRAMUSCULAR | Status: DC
  Administered 2012-09-17 – 2012-09-18 (×2): 3 mL via INTRAVENOUS

## 2012-09-17 MED ORDER — SODIUM CHLORIDE 0.9 % IJ SOLN: 3.0000 mL | INTRAMUSCULAR | Status: DC | PRN

## 2012-09-17 MED ORDER — DULOXETINE HCL 60 MG PO CPEP: 60.0000 mg | ORAL_CAPSULE | Freq: Every day | ORAL | Status: DC

## 2012-09-17 MED ORDER — HEPARIN SODIUM (PORCINE) 5000 UNIT/ML IJ SOLN
5000.0000 [IU] | Freq: Three times a day (TID) | INTRAMUSCULAR | Status: DC
  Administered 2012-09-17 – 2012-09-18 (×2): 5000 [IU] via SUBCUTANEOUS
  Filled 2012-09-17 (×5): qty 1

## 2012-09-17 MED ORDER — HYDROCHLOROTHIAZIDE 25 MG PO TABS
25.0000 mg | ORAL_TABLET | Freq: Every day | ORAL | Status: DC
  Filled 2012-09-17: qty 1

## 2012-09-17 MED ORDER — DULOXETINE HCL 60 MG PO CPEP
60.0000 mg | ORAL_CAPSULE | Freq: Every day | ORAL | Status: DC
  Administered 2012-09-17 – 2012-09-18 (×2): 60 mg via ORAL
  Filled 2012-09-17 (×3): qty 1

## 2012-09-17 MED ORDER — ZOLPIDEM TARTRATE 5 MG PO TABS
5.0000 mg | ORAL_TABLET | Freq: Every evening | ORAL | Status: DC | PRN
  Administered 2012-09-18 (×2): 5 mg via ORAL
  Filled 2012-09-17 (×2): qty 1

## 2012-09-17 MED ORDER — GABAPENTIN 300 MG PO CAPS
300.0000 mg | ORAL_CAPSULE | Freq: Three times a day (TID) | ORAL | Status: DC
  Administered 2012-09-17 – 2012-09-18 (×4): 300 mg via ORAL
  Filled 2012-09-17 (×7): qty 1

## 2012-09-17 MED ORDER — TRAMADOL HCL 50 MG PO TABS: 12.5000 mg | ORAL_TABLET | Freq: Four times a day (QID) | ORAL | Status: DC | PRN

## 2012-09-17 MED ORDER — HYDROCHLOROTHIAZIDE 25 MG PO TABS
25.0000 mg | ORAL_TABLET | Freq: Every day | ORAL | Status: DC
  Administered 2012-09-17: 25 mg via ORAL
  Filled 2012-09-17 (×2): qty 1

## 2012-09-17 MED ORDER — SODIUM CHLORIDE 0.9 % IV SOLN: 250.0000 mL | INTRAVENOUS | Status: DC | PRN

## 2012-09-17 NOTE — ED Notes (Addendum)
Per pt sts crushing chest pain and SOB that has been going on intermittently over the past few weeks. sts SOB when walking down the street. sts having tightness in chest now and SOB. Denies cough, fever. sts she has been having fatigue and trouble opening doors. Her doctor took her out of work x 1 month. She is being worked up for different issues.

## 2012-09-17 NOTE — ED Notes (Signed)
C/o intermittent episodes of exertional CP with SOB x 2 weeks. Episodes last approx 2-5 min at a time. No pedal edema noted. Denies fever, cold, cough.

## 2012-09-17 NOTE — ED Provider Notes (Signed)
History     CSN: [id number]  Arrival date & time 09/17/12  1402   First MD Initiated Contact with Patient 09/17/12 1601      Chief Complaint  Patient presents with  . Chest Pain     HPI  The patient presents with concerns of ongoing chest pain, fatigability and dyspnea.  She notes that prior to proximally 3 weeks ago she had an ongoing illness, largely consisting of persistent sharp diffuse pain.  For that illness she was seeing an orthopedist, as well as her primary care physician. 3 weeks the patient had an acute event: 50 minutes of squeezing anterior chest pain, with dyspnea.  Since that time the patient has noticed worsening dyspnea on exertion, intermittent chest pain, lightheadedness.  She denies true syncope.  On exam the patient has no chest pain.  She does note mild dyspnea at rest, states that this is severe with moderate exertion, with accompanying generalized fatigue. She denies new cough, fever, chills, vomiting, diarrhea, anorexia. She is scheduled to see both a cardiologist and a neurologist within the month.  Past Medical History  Diagnosis Date  . Neuropathy     Past Surgical History  Procedure Date  . Tonsillectomy     History reviewed. No pertinent family history.  History  Substance Use Topics  . Smoking status: Never Smoker   . Smokeless tobacco: Not on file  . Alcohol Use: No    OB History    Grav Para Term Preterm Abortions TAB SAB Ect Mult Living                  Review of Systems  Constitutional:       Per HPI, otherwise negative  HENT:       Per HPI, otherwise negative  Eyes: Negative.   Respiratory:       Per HPI, otherwise negative  Cardiovascular:       Per HPI, otherwise negative  Gastrointestinal: Negative for vomiting.  Genitourinary: Negative.   Musculoskeletal:       Per HPI, otherwise negative  Skin: Negative.   Neurological: Negative for syncope.    Allergies  Review of patient's allergies indicates no known  allergies.  Home Medications   Current Outpatient Rx  Name  Route  Sig  Dispense  Refill  . DULOXETINE HCL 60 MG PO CPEP   Oral   Take 60 mg by mouth daily.         [facility] GABAPENTIN 300 MG PO CAPS   Oral   Take 300 mg by mouth 3 (three) times daily.         [facility] HYDROCHLOROTHIAZIDE 25 MG PO TABS   Oral   Take 25 mg by mouth daily.         . TRAMADOL HCL 50 MG PO TABS   Oral   Take 12.5-50 mg by mouth every 6 (six) hours as needed. For pain         . ZOLPIDEM TARTRATE 10 MG PO TABS   Oral   Take 10 mg by mouth at bedtime as needed. For sleep           BP 123/81  Pulse 81  Temp 97.6 F (36.4 C) (Oral)  Resp 22  SpO2 100%  LMP 11/05/2007  Physical Exam  Nursing note and vitals reviewed. Constitutional: She is oriented to person, place, and time. She appears well-developed and well-nourished. No distress.  HENT:  Head: Normocephalic and atraumatic.  Eyes: Conjunctivae normal and  EOM are normal.  Cardiovascular: Normal rate and regular rhythm.   Pulmonary/Chest: Effort normal and breath sounds normal. No stridor. No respiratory distress.  Abdominal: She exhibits no distension.  Musculoskeletal: She exhibits no edema.  Neurological: She is alert and oriented to person, place, and time. No cranial nerve deficit.  Skin: Skin is warm and dry.  Psychiatric: She has a normal mood and affect.    ED Course  Procedures (including critical care time)  Labs Reviewed  COMPREHENSIVE METABOLIC PANEL - Abnormal; Notable for the following:    Potassium 2.8 (*)     Glucose, Bld 142 (*)     All other components within normal limits  CBC WITH DIFFERENTIAL  POCT I-STAT TROPONIN I  CK  URINALYSIS, ROUTINE W REFLEX MICROSCOPIC   Dg Chest 2 View  09/17/2012  *RADIOLOGY REPORT*  Clinical Data: Chest pain with shortness of breath.  CHEST - 2 VIEW  Comparison: 09/08/2012  Findings: The lungs are clear without focal infiltrate, edema, pneumothorax or pleural effusion. The  cardiopericardial silhouette is within normal limits for size.  Imaged bony structures of the thorax are intact.  IMPRESSION: Stable.  No acute findings.   Original Report Authenticated By: Kennith Center, M.D.      No diagnosis found.   Date: 09/17/2012  Rate: 85  Rhythm: normal sinus rhythm  QRS Axis: normal  Intervals: normal  ST/T Wave abnormalities: nonspecific T wave changes  Conduction Disutrbances:none  Narrative Interpretation:   Old EKG Reviewed: none available BORDERLINE  Cardiac: 75sr, normal  O2- 99%ra, normal   With consideration of the patient's dyspnea on exertion prior chest pain, such as pain, d-dimer was performed.  This was negative.  MDM  This patient now presents with concerns of ongoing intermittent chest pain, dyspnea, dyspnea on exertion.  On exam the patient is hemodynamically stable, with unremarkable vital signs.  Given her description of a prior severe pain episode, there was some suspicion of prior MI with subsequent cardiomyopathy versus infectious versus pulmonary etiologies.  There is no gross evidence of acute heart failure.  The patient's initial labs were largely reassuring, but given her symptoms, she was admitted for further evaluation and management.  Gerhard Munch, MD 09/17/12 2031

## 2012-09-17 NOTE — H&P (Signed)
[facility] History and Physical  Tammy [name] ZOX:[id number] DOB: 1961/01/31 DOA: 09/17/2012  Referring physician: ED PCP: Lunette Stands, MD  Specialists: None  Chief Complaint: Chest pain  HPI: Tammy [name] is a 51 y.o. female who presents with c/o chest pain, dyspnea, and fatigability.  These onset approximately 3 weeks ago, with an acute event including 15 mins of crushing L sided chest pain and shortness of breath that resolved after she went to lay down.  Since then she has noticed worsening dyspnea on exertion, even walking a short distance causes dyspnea, intermittent chest pain.  She was scheduled to see both a cardiologist and a neurologist within the month but decided to come in due to worsening symptoms.  In the ED troponins are negative times one, rest of the workup unremarkable, hospitalist has been asked to admit for r/o cardiac.  Review of Systems: Patient is not having chest pain at this time, no recent fever, chills, cough, abd pain, nausea, vomiting, 12 systems reviewed and otherwise negative.  Past Medical History  Diagnosis Date  . Neuropathy    Past Surgical History  Procedure Date  . Tonsillectomy    Social History:  reports that she has never smoked. She does not have any smokeless tobacco history on file. She reports that she does not drink alcohol. Her drug history not on file.   No Known Allergies  Family History  Problem Relation Age of Onset  . Heart attack Maternal Grandmother 50  No early onset CAD in a first degree relative however.  Prior to Admission medications   Medication Sig Start Date End Date Taking? Authorizing Provider  DULoxetine (CYMBALTA) 60 MG capsule Take 60 mg by mouth daily.   Yes Historical Provider, MD  gabapentin (NEURONTIN) 300 MG capsule Take 300 mg by mouth 3 (three) times daily.   Yes Historical Provider, MD  hydrochlorothiazide (HYDRODIURIL) 25 MG tablet Take 25 mg by mouth daily.   Yes Historical Provider, MD    traMADol (ULTRAM) 50 MG tablet Take 12.5-50 mg by mouth every 6 (six) hours as needed. For pain   Yes Historical Provider, MD  zolpidem (AMBIEN) 10 MG tablet Take 10 mg by mouth at bedtime as needed. For sleep   Yes Historical Provider, MD   Physical Exam: Filed Vitals:   09/17/12 1930 09/17/12 2000 09/17/12 2030 09/17/12 2133  BP: 114/81 120/95 107/75 109/72  Pulse: 76 81 76 76  Temp:    99 F (37.2 C)  TempSrc:    Oral  Resp: 19 19 20 18   SpO2: 100% 100% 100% 99%    General:  NAD, resting comfortably in bed Eyes: PEERLA EOMI ENT: mucous membranes moist Neck: supple w/o JVD Cardiovascular: RRR w/o MRG Respiratory: CTA B Abdomen: soft, nt, nd, bs+ Skin: no rash nor lesion Musculoskeletal: MAE, full ROM all 4 extremities Psychiatric: normal tone and affect Neurologic: AAOx3, grossly non-focal  Labs on Admission:  Basic Metabolic Panel:  Lab 09/17/12 8119  NA 137  K 2.8*  CL 97  CO2 26  GLUCOSE 142*  BUN 12  CREATININE 0.67  CALCIUM 10.0  MG --  PHOS --   Liver Function Tests:  Lab 09/17/12 1418  AST 20  ALT 16  ALKPHOS 45  BILITOT 0.3  PROT 7.0  ALBUMIN 4.2   No results found for this basename: LIPASE:5,AMYLASE:5 in the last 168 hours No results found for this basename: AMMONIA:5 in the last 168 hours CBC:  Lab 09/17/12 1418  WBC  4.1  NEUTROABS 3.0  HGB 12.9  HCT 39.0  MCV 83.9  PLT 196   Cardiac Enzymes:  Lab 09/17/12 1650  CKTOTAL 39  CKMB --  CKMBINDEX --  TROPONINI --    BNP (last 3 results) No results found for this basename: PROBNP:3 in the last 8760 hours CBG: No results found for this basename: GLUCAP:5 in the last 168 hours  Radiological Exams on Admission: Dg Chest 2 View  09/17/2012  *RADIOLOGY REPORT*  Clinical Data: Chest pain with shortness of breath.  CHEST - 2 VIEW  Comparison: 09/08/2012  Findings: The lungs are clear without focal infiltrate, edema, pneumothorax or pleural effusion. The cardiopericardial silhouette is  within normal limits for size.  Imaged bony structures of the thorax are intact.  IMPRESSION: Stable.  No acute findings.   Original Report Authenticated By: Kennith Center, M.D.     EKG: Independently reviewed. No evidence for STEMI.  Assessment/Plan Principal Problem:  *Chest pain   1. Chest pain - admitting patient for cardiac rule out, needs cards eval, probably needs stress test vs cath.  2d echo ordered, repeat EKG ordered, trops pending, no chest pain at this time. 2. High Cholesterol - recheck fasting lipids in AM, probably needs to have this treated. 3. ? HTN - continue home meds, on HCTZ at home.  No consults obtained.  Code Status: full code (must indicate code status--if unknown or must be presumed, indicate so) Family Communication: spoke with family member in room, husband is away at this time (indicate person spoken with, if applicable, with phone number if by telephone) Disposition Plan: Admit to obs (indicate anticipated LOS)  Time spent: 70 min  GARDNER, JARED M. [facility] Pager [phone]  If 7PM-7AM, please contact night-coverage [email or web address] Password [facility] 09/17/2012, 10:27 PM

## 2012-09-17 NOTE — ED Notes (Signed)
Admitting MD at bedside.

## 2012-09-18 ENCOUNTER — Encounter (HOSPITAL_COMMUNITY): Payer: Self-pay | Admitting: *Deleted

## 2012-09-18 ENCOUNTER — Encounter (HOSPITAL_COMMUNITY)
Admission: EM | Disposition: A | Discharge status: Home / Self Care | Payer: Self-pay | Source: Home / Self Care | Attending: Emergency Medicine

## 2012-09-18 DIAGNOSIS — E876 Hypokalemia: Secondary | ICD-10-CM

## 2012-09-18 DIAGNOSIS — G629 Polyneuropathy, unspecified: Secondary | ICD-10-CM

## 2012-09-18 DIAGNOSIS — G589 Mononeuropathy, unspecified: Secondary | ICD-10-CM

## 2012-09-18 DIAGNOSIS — R079 Chest pain, unspecified: Secondary | ICD-10-CM

## 2012-09-18 DIAGNOSIS — E785 Hyperlipidemia, unspecified: Secondary | ICD-10-CM

## 2012-09-18 DIAGNOSIS — I200 Unstable angina: Secondary | ICD-10-CM

## 2012-09-18 HISTORY — PX: CARDIAC CATHETERIZATION: SHX172

## 2012-09-18 HISTORY — PX: LEFT HEART CATHETERIZATION WITH CORONARY ANGIOGRAM: SHX5451

## 2012-09-18 LAB — BASIC METABOLIC PANEL
BUN: 10 mg/dL (ref 6–23)
CO2: 27 mEq/L (ref 19–32)
Calcium: 9.9 mg/dL (ref 8.4–10.5)
Chloride: 102 mEq/L (ref 96–112)
Creatinine, Ser: 0.67 mg/dL (ref 0.50–1.10)
GFR calc Af Amer: >90 mL/min (ref >90)
GFR calc non Af Amer: >90 mL/min (ref >90)
Glucose, Bld: 94 mg/dL (ref 70–99)
Potassium: 3.1 mEq/L — ABNORMAL LOW (ref 3.5–5.1)
Sodium: 141 mEq/L (ref 135–145)

## 2012-09-18 LAB — CBC
HCT: 38.4 % (ref 36.0–46.0)
Hemoglobin: 12.6 g/dL (ref 12.0–15.0)
MCH: 27.6 pg (ref 26.0–34.0)
MCHC: 32.8 g/dL (ref 30.0–36.0)
MCV: 84 fL (ref 78.0–100.0)
Platelets: 193 10*3/uL (ref 150–400)
RBC: 4.57 MIL/uL (ref 3.87–5.11)
RDW: 13.1 % (ref 11.5–15.5)
WBC: 3.1 10*3/uL — ABNORMAL LOW (ref 4.0–10.5)

## 2012-09-18 LAB — LIPID PANEL
Cholesterol: 287 mg/dL — ABNORMAL HIGH (ref 0–200)
HDL: 93 mg/dL (ref >39)
LDL Cholesterol: 175 mg/dL — ABNORMAL HIGH (ref 0–99)
Total CHOL/HDL Ratio: 3.1 RATIO
Triglycerides: 95 mg/dL (ref <150)
VLDL: 19 mg/dL (ref 0–40)

## 2012-09-18 LAB — TROPONIN I
Troponin I: <0.3 ng/mL (ref <0.30)
Troponin I: <0.3 ng/mL (ref <0.30)

## 2012-09-18 LAB — MAGNESIUM: Magnesium: 2.1 mg/dL (ref 1.5–2.5)

## 2012-09-18 SURGERY — LEFT HEART CATHETERIZATION WITH CORONARY ANGIOGRAM
Anesthesia: LOCAL

## 2012-09-18 MED ORDER — SODIUM CHLORIDE 0.9 % IV SOLN: 250.0000 mL | INTRAVENOUS | Status: DC | PRN

## 2012-09-18 MED ORDER — DIAZEPAM 5 MG PO TABS
5.0000 mg | ORAL_TABLET | ORAL | Status: AC
  Administered 2012-09-18: 5 mg via ORAL
  Filled 2012-09-18: qty 1

## 2012-09-18 MED ORDER — FENTANYL CITRATE 0.05 MG/ML IJ SOLN
INTRAMUSCULAR | Status: AC
  Filled 2012-09-18: qty 2

## 2012-09-18 MED ORDER — ASPIRIN EC 81 MG PO TBEC
81.0000 mg | DELAYED_RELEASE_TABLET | Freq: Every day | ORAL | Status: DC
  Administered 2012-09-18: 81 mg via ORAL
  Filled 2012-09-18 (×2): qty 1

## 2012-09-18 MED ORDER — SODIUM CHLORIDE 0.9 % IJ SOLN: 3.0000 mL | Freq: Two times a day (BID) | INTRAMUSCULAR | Status: DC

## 2012-09-18 MED ORDER — METOPROLOL TARTRATE 25 MG PO TABS
25.0000 mg | ORAL_TABLET | Freq: Two times a day (BID) | ORAL | Status: DC
  Administered 2012-09-18 (×2): 25 mg via ORAL
  Filled 2012-09-18 (×4): qty 1

## 2012-09-18 MED ORDER — SODIUM CHLORIDE 0.9 % IJ SOLN: 3.0000 mL | INTRAMUSCULAR | Status: DC | PRN

## 2012-09-18 MED ORDER — NITROGLYCERIN 0.2 MG/ML ON CALL CATH LAB
INTRAVENOUS | Status: AC
  Filled 2012-09-18: qty 1

## 2012-09-18 MED ORDER — MIDAZOLAM HCL 2 MG/2ML IJ SOLN
INTRAMUSCULAR | Status: AC
  Filled 2012-09-18: qty 2

## 2012-09-18 MED ORDER — ACETAMINOPHEN 325 MG PO TABS
650.0000 mg | ORAL_TABLET | ORAL | Status: DC | PRN
  Filled 2012-09-18: qty 2

## 2012-09-18 MED ORDER — SODIUM CHLORIDE 0.9 % IV SOLN: 1.0000 mL/kg/h | INTRAVENOUS | Status: DC

## 2012-09-18 MED ORDER — VERAPAMIL HCL 2.5 MG/ML IV SOLN
INTRAVENOUS | Status: AC
  Filled 2012-09-18: qty 2

## 2012-09-18 MED ORDER — POTASSIUM CHLORIDE CRYS ER 20 MEQ PO TBCR
40.0000 meq | EXTENDED_RELEASE_TABLET | Freq: Once | ORAL | Status: AC
  Administered 2012-09-18: 40 meq via ORAL
  Filled 2012-09-18: qty 2

## 2012-09-18 MED ORDER — ATORVASTATIN CALCIUM 40 MG PO TABS
40.0000 mg | ORAL_TABLET | Freq: Every day | ORAL | Status: DC
  Administered 2012-09-18: 40 mg via ORAL
  Filled 2012-09-18 (×2): qty 1

## 2012-09-18 MED ORDER — SODIUM CHLORIDE 0.9 % IV SOLN: INTRAVENOUS | Status: AC

## 2012-09-18 MED ORDER — HEPARIN (PORCINE) IN NACL 2-0.9 UNIT/ML-% IJ SOLN
INTRAMUSCULAR | Status: AC
  Filled 2012-09-18: qty 1000

## 2012-09-18 MED ORDER — ONDANSETRON HCL 4 MG/2ML IJ SOLN: 4.0000 mg | Freq: Four times a day (QID) | INTRAMUSCULAR | Status: DC | PRN

## 2012-09-18 MED ORDER — ATORVASTATIN CALCIUM 20 MG PO TABS
20.0000 mg | ORAL_TABLET | Freq: Every day | ORAL | Status: DC
  Filled 2012-09-18: qty 1

## 2012-09-18 MED ORDER — LIDOCAINE HCL (PF) 1 % IJ SOLN
INTRAMUSCULAR | Status: AC
  Filled 2012-09-18: qty 30

## 2012-09-18 NOTE — Progress Notes (Signed)
Pt's nurse was completing visit when I arrived. Pt was preparing for cath lab. During my visit other staff members from ped's unit arrived. Pt asked for prayer and we all joined in a circle w/pt and prayed for her. Pt was very appreciative of visit and prayer.  Marjory Lies Chaplain

## 2012-09-18 NOTE — Progress Notes (Signed)
[facility]             Progress Note   Subjective: Continues to have some mild CP.  Objective: Vital signs in last 24 hours: Temp:  [97.6 F (36.4 C)-99 F (37.2 C)] 98.7 F (37.1 C) (12/30 0434) Pulse Rate:  [71-84] 84  (12/30 0434) Resp:  [15-25] 19  (12/30 0434) BP: (105-128)/(67-95) 109/67 mmHg (12/30 0434) SpO2:  [99 %-100 %] 99 % (12/30 0434) Weight:  [62.6 kg (138 lb 0.1 oz)] 62.6 kg (138 lb 0.1 oz) (12/30 0434) Weight change:  Last BM Date: 09/17/12  Intake/Output from previous day:       Physical Exam: General: Alert, awake, oriented x3. HEENT: No bruits, no goiter. Heart: Regular rate and rhythm, without murmurs, rubs, gallops. Lungs: Clear to auscultation bilaterally. Abdomen: Soft, nontender, nondistended, positive bowel sounds. Extremities: No clubbing cyanosis or edema with positive pedal pulses. Neuro: Grossly intact, nonfocal.    Lab Results: Basic Metabolic Panel:  Basename 09/18/12 0525 09/17/12 1418  NA 141 137  K 3.1* 2.8*  CL 102 97  CO2 27 26  GLUCOSE 94 142*  BUN 10 12  CREATININE 0.67 0.67  CALCIUM 9.9 10.0  MG -- --  PHOS -- --   Liver Function Tests:  Basename 09/17/12 1418  AST 20  ALT 16  ALKPHOS 45  BILITOT 0.3  PROT 7.0  ALBUMIN 4.2   CBC:  Basename 09/18/12 0525 09/17/12 1418  WBC 3.1* 4.1  NEUTROABS -- 3.0  HGB 12.6 12.9  HCT 38.4 39.0  MCV 84.0 83.9  PLT 193 196   Cardiac Enzymes:  Basename 09/18/12 0525 09/17/12 1650  CKTOTAL -- 39  CKMB -- --  CKMBINDEX -- --  TROPONINI <0.30 --   D-Dimer:  Basename 09/17/12 1740  DDIMER 0.27   Fasting Lipid Panel:  Basename 09/18/12 0525  CHOL 287*  HDL 93  LDLCALC 175*  TRIG 95  CHOLHDL 3.1  LDLDIRECT --   Urinalysis:  Basename 09/17/12 1755  COLORURINE YELLOW  LABSPEC 1.006  PHURINE 7.5  GLUCOSEU NEGATIVE  HGBUR NEGATIVE  BILIRUBINUR NEGATIVE  KETONESUR NEGATIVE  PROTEINUR NEGATIVE  UROBILINOGEN 0.2  NITRITE NEGATIVE    LEUKOCYTESUR SMALL*    Studies/Results: Dg Chest 2 View  09/17/2012  *RADIOLOGY REPORT*  Clinical Data: Chest pain with shortness of breath.  CHEST - 2 VIEW  Comparison: 09/08/2012  Findings: The lungs are clear without focal infiltrate, edema, pneumothorax or pleural effusion. The cardiopericardial silhouette is within normal limits for size.  Imaged bony structures of the thorax are intact.  IMPRESSION: Stable.  No acute findings.   Original Report Authenticated By: Kennith Center, M.D.     Medications: Scheduled Meds:   . DULoxetine  60 mg Oral Daily  . gabapentin  300 mg Oral TID  . heparin  5,000 Units Subcutaneous Q8H  . hydrochlorothiazide  25 mg Oral Daily  . sodium chloride  3 mL Intravenous Q12H  . sodium chloride  3 mL Intravenous Q12H   Continuous Infusions:  PRN Meds:.sodium chloride, sodium chloride, traMADol, zolpidem  Assessment/Plan:  Principal Problem:  *Chest pain Active Problems:  Hyperlipidemia  Hypokalemia    Chest Pain -Sounds typical by history. -EKG is non-acute and so far 2 troponins are negative. -CAD risk factors include family history (half brother with MI on his 30s), HLD and possibly HTN. -I think she will require further cardiac workup (stress vs cath). -I have asked [facility] cards to see her in consultation. -2D ECHO pending.  Hypokalemia -Continue to replete PO. -Check Mg level.  Hyperlipidemia -LDL 175 today. -Will re challenge with  statin (had been stopped due to LE pain that has since been attributed to neuropathy).   Time spent coordinating care: 35 minutes.   LOS: 1 day   [facility] Pager: [phone] 09/18/2012, 8:31 AM

## 2012-09-18 NOTE — Consult Note (Signed)
Cardiology Consult Note   Patient ID: Tammy [name] MRN: [id number], DOB/AGE: 1961/01/24   Admit date: 09/17/2012 Date of Consult: 09/18/2012  Primary Physician: Lunette Stands, MD Primary Cardiologist: new to cardiology   Reason for consult: evaluation/management of chest pain  HPI: Tammy [name] is a 50yo female with no prior cardiac history, PMHx s/f hyperlipidemia and cervical & lumbar neuropathy who was hospitalized yesterday at [facility] for chest pain.   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 She reports intermittent substernal chest tightness radiating to her left neck/face lasting 15-20 minutes at a time, aggravated by exertion, alleviated with rest with associated shortness of breath and fatigue. She is typically quite active- able to power walk for 1 hour. During this time, she has noted increased DOE and fatigue (unable to hang Christmas ornaments or cross a room before becoming short of breath). No aggravation laying flat, position changes, deep inspiration or after meals. No n/v/d, new cough, myalgias or sick contacts. Does note increased stress around the holidays. No EtOH, tobacco or elicit drug use. Maternal grandmother, paternal grandfather with MI, heart disease. Half-brother with significant MI in 30s. Of note, she has been been on a statin in the past, however this was held d/t bilateral LE pain. This has not improved since, was present before statin initiation and attributed to neuropathy. She was scheduled to see a neurologist and cardiologist within the month, however her symptoms progressed. She was singing at church yesterday, became short of breath and she presented to the ED.   There, EKG revealed no ischemic changes. TnI WNL. CXR w/o acute findings. BMET indicated hypokalemia. CBC WNL. D-dimer WNL. VSS. The patient was observed overnight by the medicine service. A subsequent TnI returned WNL. Hypokalemia evident this AM.   Problem List: Past Medical  History  Diagnosis Date  . Neuropathy   . High cholesterol     Past Surgical History  Procedure Date  . Tonsillectomy      Allergies: No Known Allergies  Home Medications: Prior to Admission medications   Medication Sig Start Date End Date Taking? Authorizing Provider  DULoxetine (CYMBALTA) 60 MG capsule Take 60 mg by mouth daily.   Yes Historical Provider, MD  gabapentin (NEURONTIN) 300 MG capsule Take 300 mg by mouth 3 (three) times daily.   Yes Historical Provider, MD  hydrochlorothiazide (HYDRODIURIL) 25 MG tablet Take 25 mg by mouth daily.   Yes Historical Provider, MD  traMADol (ULTRAM) 50 MG tablet Take 12.5-50 mg by mouth every 6 (six) hours as needed. For pain   Yes Historical Provider, MD  zolpidem (AMBIEN) 10 MG tablet Take 10 mg by mouth at bedtime as needed. For sleep   Yes Historical Provider, MD    Inpatient Medications:     . atorvastatin  20 mg Oral q1800  . DULoxetine  60 mg Oral Daily  . gabapentin  300 mg Oral TID  . heparin  5,000 Units Subcutaneous Q8H  . hydrochlorothiazide  25 mg Oral Daily  . potassium chloride  40 mEq Oral Once  . sodium chloride  3 mL Intravenous Q12H  . sodium chloride  3 mL Intravenous Q12H   Prescriptions prior to admission  Medication Sig Dispense Refill  . DULoxetine (CYMBALTA) 60 MG capsule Take 60 mg by mouth daily.      Marland Kitchen gabapentin (NEURONTIN) 300 MG capsule Take 300 mg by mouth 3 (three) times daily.      . hydrochlorothiazide (HYDRODIURIL) 25 MG tablet  Take 25 mg by mouth daily.      . traMADol (ULTRAM) 50 MG tablet Take 12.5-50 mg by mouth every 6 (six) hours as needed. For pain      . zolpidem (AMBIEN) 10 MG tablet Take 10 mg by mouth at bedtime as needed. For sleep        Family History  Problem Relation Age of Onset  . Heart attack Maternal Grandmother 50     History   Social History  . Marital Status: Married    Spouse Name: N/A    Number of Children: N/A  . Years of Education: N/A   Occupational  History  . Not on file.   Social History Main Topics  . Smoking status: Never Smoker   . Smokeless tobacco: Not on file  . Alcohol Use: No  . Drug Use: No  . Sexually Active: Not on file   Other Topics Concern  . Not on file   Social History Narrative  . No narrative on file     Review of Systems: General: positive for fatigue, negative for chills, fever, night sweats or weight changes.  Cardiovascular: positive for chest pain, dyspnea on exertion, SOB, negative edema, orthopnea, palpitations, paroxysmal nocturnal dyspnea Dermatological: negative for rash Respiratory:  negative for cough or wheezing Urologic: negative for hematuria Abdominal: negative for nausea, vomiting, diarrhea, bright red blood per rectum, melena, or hematemesis Neurologic: negative for visual changes, syncope, or dizziness All other systems reviewed and are otherwise negative except as noted above.  Physical Exam: Blood pressure 109/67, pulse 84, temperature 98.7 F (37.1 C), temperature source Oral, resp. rate 19, height 5\' 3"  (1.6 m), weight 62.6 kg (138 lb 0.1 oz), last menstrual period 11/05/2007, SpO2 99.00%.   General: Well developed, well nourished, in no acute distress. Head: Normocephalic, atraumatic, sclera non-icteric, no xanthomas, nares are without discharge.  Neck: Negative for carotid bruits. JVD not elevated. Lungs: Clear bilaterally to auscultation without wheezes, rales, or rhonchi. Breathing is unlabored. Heart: RRR with S1 S2. No murmurs, rubs, or gallops appreciated. Abdomen: Soft, non-tender, non-distended with normoactive bowel sounds. No hepatomegaly. No rebound/guarding. No obvious abdominal masses. Msk:  Strength and tone appears normal for age. Extremities: No clubbing, cyanosis or edema.  Distal pedal pulses are 2+ and equal bilaterally. Neuro: Alert and oriented X 3. Moves all extremities spontaneously. Psych:  Responds to questions appropriately with a normal  affect.  Labs: Recent Labs  Basename 09/18/12 0525 09/17/12 1418   WBC 3.1* 4.1   HGB 12.6 12.9   HCT 38.4 39.0   MCV 84.0 83.9   PLT 193 196    Recent Labs  Basename 09/17/12 1740   DDIMER 0.27    Lab 09/18/12 0525 09/17/12 1418  NA 141 137  K 3.1* 2.8*  CL 102 97  CO2 27 26  BUN 10 12  CREATININE 0.67 0.67  CALCIUM 9.9 10.0  PROT -- 7.0  BILITOT -- 0.3  ALKPHOS -- 45  ALT -- 16  AST -- 20  AMYLASE -- --  LIPASE -- --  GLUCOSE 94 142*   Recent Labs  Basename 09/18/12 0525 09/17/12 1650   CKTOTAL -- 39   CKMB -- --   CKMBINDEX -- --   TROPONINI <0.30 --   Recent Labs  Basename 09/18/12 0525   CHOL 287*   HDL 93   LDLCALC 175*   TRIG 95   CHOLHDL 3.1   LDLDIRECT --   Radiology/Studies: Dg Chest 2 View  09/17/2012  *RADIOLOGY REPORT*  Clinical Data: Chest pain with shortness of breath.  CHEST - 2 VIEW  Comparison: 09/08/2012  Findings: The lungs are clear without focal infiltrate, edema, pneumothorax or pleural effusion. The cardiopericardial silhouette is within normal limits for size.  Imaged bony structures of the thorax are intact.  IMPRESSION: Stable.  No acute findings.   Original Report Authenticated By: Kennith Center, M.D.     EKG: NSR, 85 bpm, no ST/T changes  ASSESSMENT AND PLAN:   1. Unstable angina- patient's HPI is concerning for unstable angina.  She has had a half-brother with a significant MI in his 30s, and two grandparents with CAD. Troponin since admission has been WNL. No ST changes on EKG. Unfortunately, she has eaten breakfast this AM precluding Myoview testing. Will plan for cath this afternoon. NPO starting now. Start low-dose ASA, BB, increase atrovastatin to 40mg  daily.   2. Hyperlipidemia- LDL 175 this admission. Question of statin intolerance in the past due to lower extremity pain, however this has been attributed to neuropathy and has persisted since statin discontinuation. Increase atorvastatin to 40mg  daily.  3. Hypokalemia-  replete.   4. Neuropathy- cervical & lumbar. Currently stable. Has an appointment to follow-up with a [facility] neurologist.     Signed, R. Hurman Horn, PA-C 09/18/2012, 8:41 AM  Attending Note:   The patient was seen and examined.  Agree with assessment and plan as noted above.  The above note was edited as needed.  She had symptoms stating about 3 weeks ago.  These started with an intense episode of severe chest tightness and dyspnea.  Now she has significant dyspnea and weakness with any exertion.  This inability to do any activity started abruptly - not a gradual onset.  Exam is unremarkable. Lungs are clear.  This is a "+ stress test equivilant" and I think she needs a cath.    Will schedule her for a cath this afternoon ( she has eaten breakfast this am).  Change her HCTZ to metoprolol.  Add atorvastatin 40 qd.  Start asa.   Discussed risks, benefits, and options.  She understands and agrees to proceed.    Vesta Mixer, Montez Hageman., MD, [facility] 09/18/2012, 8:57 AM

## 2012-09-18 NOTE — CV Procedure (Signed)
   Cardiac Catheterization Operative Report  Tammy [name] [id number] 12/30/20132:13 PM Lunette Stands, MD  Procedure Performed:  1. Left Heart Catheterization 2. Selective Coronary Angiography 3. Left ventricular angiogram 4. Mynx femoral artery closure device  Operator: Verne Carrow, MD  Indication: 51 yo WF with recent history of exertional chest pressure and SOB. She was seen this am by our consult team and her symptoms were felt to represent unstable angina. Cardiac cath to exclude obstructive CAD.                                     Procedure Details: The risks, benefits, complications, treatment options, and expected outcomes were discussed with the patient. The patient and/or family concurred with the proposed plan, giving informed consent. The patient was brought to the cath lab after IV hydration was begun and oral premedication was given. The patient was further sedated with Versed and Fentanyl. Allens test on left wrist was positive. The left wrist was prepped and draped. 1% lidocaine was used for local anesthesia. I was unable to cannulate the left radial artery. The right groin was prepped and draped in the usual manner. 1% lidocaine was used for local anesthesia. Using the modified Seldinger access technique, a 5 French sheath was placed in the right femoral artery. Standard diagnostic catheters were used to perform selective coronary angiography. A pigtail catheter was used to perform a left ventricular angiogram. A Mynx closure device was deployed in the right femoral artery.   There were no immediate complications. The patient was taken to the recovery area in stable condition.   Hemodynamic Findings: Central aortic pressure: 96/58 Left ventricular pressure: 94/7/11  Angiographic Findings:  Left main: No evidence of disease.   Left Anterior Descending Artery: Large caliber vessel that courses to the apex. There is a moderate caliber diagonal branch. No evidence  of disease.  Intermediate Artery: Small to moderate caliber vessel with no disease noted.   Circumflex Artery: Moderate caliber vessel with termination into a moderate sized obtuse marginal branch. No obstructive disease noted.   Right Coronary Artery: Large, dominant artery with no disease noted.   Left Ventricular Angiogram: LVEF=50-55%.   Impression: 1. No angiographic evidence of CAD 2. Preserved LV systolic function 3. Normal filling pressures.   Recommendations: Continue with plans for echocardiogram today. ? Etiology of her dyspnea and chest pain. This could be related to coronary vasospasm. D-dimer was within normal limits yesterday so probability of pulmonary embolism is very low. Consider trial of Calcium channel blockade or long acting nitroglycerin.        Complications:  None. The patient tolerated the procedure well.

## 2012-09-19 LAB — GLUCOSE, CAPILLARY: Glucose-Capillary: 94 mg/dL (ref 70–99)

## 2012-09-19 MED ORDER — ASPIRIN 81 MG PO TBEC: 81.0000 mg | DELAYED_RELEASE_TABLET | Freq: Every day | ORAL | Status: DC

## 2012-09-19 MED ORDER — NITROGLYCERIN 0.4 MG SL SUBL: 0.4000 mg | SUBLINGUAL_TABLET | SUBLINGUAL | Status: DC | PRN

## 2012-09-19 MED ORDER — ATORVASTATIN CALCIUM 40 MG PO TABS: 40.0000 mg | ORAL_TABLET | Freq: Every day | ORAL | Status: DC

## 2012-09-19 NOTE — Progress Notes (Signed)
Discharged to home with family office visits in place teaching done  

## 2012-09-19 NOTE — Progress Notes (Signed)
  Echocardiogram 2D Echocardiogram has been performed.  [place]ian Co 09/19/2012, 8:36 AM

## 2012-09-19 NOTE — Discharge Summary (Signed)
Physician Discharge Summary  Patient ID: Tammy [name] MRN: [id number] DOB/AGE: 02/08/1961 51 y.o.  Admit date: 09/17/2012 Discharge date: 09/19/2012  Primary Care Physician:  Lunette Stands, MD   Discharge Diagnoses:    Principal Problem:  *Chest pain Active Problems:  Hyperlipidemia  Hypokalemia  Neuropathy      Medication List     As of 09/19/2012 10:05 AM    TAKE these medications         aspirin 81 MG EC tablet   Take 1 tablet (81 mg total) by mouth daily.      atorvastatin 40 MG tablet   Commonly known as: LIPITOR   Take 1 tablet (40 mg total) by mouth daily at 6 PM.      DULoxetine 60 MG capsule   Commonly known as: CYMBALTA   Take 60 mg by mouth daily.      gabapentin 300 MG capsule   Commonly known as: NEURONTIN   Take 300 mg by mouth 3 (three) times daily.      hydrochlorothiazide 25 MG tablet   Commonly known as: HYDRODIURIL   Take 25 mg by mouth daily.      nitroGLYCERIN 0.4 MG SL tablet   Commonly known as: NITROSTAT   Place 1 tablet (0.4 mg total) under the tongue every 5 (five) minutes as needed for chest pain.      traMADol 50 MG tablet   Commonly known as: ULTRAM   Take 12.5-50 mg by mouth every 6 (six) hours as needed. For pain      zolpidem 10 MG tablet   Commonly known as: AMBIEN   Take 10 mg by mouth at bedtime as needed. For sleep          Disposition and Follow-up:  Will be discharged home today in stable and improved condition. Will need to follow up with Dr. Elease Hashimoto in about 2 weeks if symptoms continue.  Consults:  Dr. Elease Hashimoto, cardiology.   Significant Diagnostic Studies:  Dg Chest 2 View  09/17/2012  *RADIOLOGY REPORT*  Clinical Data: Chest pain with shortness of breath.  CHEST - 2 VIEW  Comparison: 09/08/2012  Findings: The lungs are clear without focal infiltrate, edema, pneumothorax or pleural effusion. The cardiopericardial silhouette is within normal limits for size.  Imaged bony structures of the thorax are  intact.  IMPRESSION: Stable.  No acute findings.   Original Report Authenticated By: Kennith Center, M.D.     Brief H and P: For complete details please refer to admission H and P, but in brief patient is a 51 y.o. female who presents with c/o chest pain, dyspnea, and fatigability. These onset approximately 3 weeks ago, with an acute event including 15 mins of crushing L sided chest pain and shortness of breath that resolved after she went to lay down. Since then she has noticed worsening dyspnea on exertion, even walking a short distance causes dyspnea, intermittent chest pain. She was scheduled to see both a cardiologist and a neurologist within the month but decided to come in due to worsening symptoms. In the ED troponins are negative times one, rest of the workup unremarkable. We were asked to admit her for further evaluation and management.     Hospital Course:  Principal Problem:  *Chest pain Active Problems:  Hyperlipidemia  Hypokalemia  Neuropathy   Chest Pain -Initial history was concerning for unstable angina. -Cards was consulted and she was taken for a cardiac cath that showed clean coronaries. -Discussed with Dr. Elease Hashimoto: it  is possible she may have had some coronary vasospasm; however because of her borderline low BPs we have elected not to start her on a CCB and instead will provide with PRN nitroglycerin. If she requires this frequently, then we may need to consider the CCB. -D-Dimer was low, effectively ruling out DVT in this patient with low pre-test probability for PE. CT Angio was not done.  Hyperlipidemia -Has been started on lipitor. -Will need a repeat lipid profile and CMET in 8 weeks to follow LFTs.  Neuropathy -Continue neurontin.   Time spent on Discharge: Greater than 30 minutes.  SignedChaya Jan [facility] Pager: [phone] 09/19/2012, 10:05 AM

## 2012-09-21 ENCOUNTER — Ambulatory Visit (INDEPENDENT_AMBULATORY_CARE_PROVIDER_SITE_OTHER): Payer: 59 | Admitting: *Deleted

## 2012-09-21 ENCOUNTER — Telehealth: Payer: Self-pay | Admitting: Cardiovascular Disease

## 2012-09-21 DIAGNOSIS — R1031 Right lower quadrant pain: Secondary | ICD-10-CM

## 2012-09-21 NOTE — Progress Notes (Signed)
Pt had LHC 09/18/12, see telephone note, right groin was assessed, slight sign of bruising with yellowish skin tone. Site palpated, lump along the right groin inguinal ligament/ L 1cm x w 2 cm x d 0.5 cm estimated. No redness no oozing no warmth. Dr Clifton James reviewed, no further action required at this time and was told to call with any changes, questions or concerns. Pt was told to take motrin for discomfort and to take it easy for a few days more but could drive. Pt taken to scheduling for f/u app with Dr Elease Hashimoto. Pt agreed to plan.

## 2012-09-21 NOTE — Telephone Encounter (Signed)
New problem:   S/p procedure by Dr. Clifton James.  C/O Increase swelling, sore. Lump has form.

## 2012-09-21 NOTE — Telephone Encounter (Signed)
Pt states she had a LHC 09/18/12, she just today noticed a lump in the groin over the access site, she thinks it is 2 cm round, its bigger today, seems to be more sore. Denies sudden onset pain/ or soreness at site. Pt was told to lay flat and feel, she thinks it is larger, told pt to come to office now and not to drive, she agreed to plan. Dr Clifton James updated.

## 2012-09-21 NOTE — Progress Notes (Signed)
The groin sounds stable

## 2012-09-21 NOTE — Telephone Encounter (Signed)
No answer/ msg left i will try to call her back in a few minutes.

## 2012-09-22 ENCOUNTER — Telehealth: Payer: Self-pay | Admitting: Cardiology

## 2012-09-22 NOTE — Telephone Encounter (Signed)
Returned call to Ms. Teehan. She reports have a cardiac cath on Monday through her groin. She noticed increased swelling in her groin site yesterday for which she was evaluated by Dr. Clifton James in the office. She was told that it looked stable and did not require any intervention, but to call if she noticed any redness. Today she thinks it is a little more swollen and red. She has some soreness, but no worsening pain. Denies bleeding, drainage, fever, or chills. I informed her that it would need further evaluation, but that she could wait until tomorrow morning and go to an urgent care facility as long as the redness did not worsen or she didn't experience any of the symptoms above. She stated understanding.  [place], PA-C 09/22/2012 10:15 PM

## 2012-09-25 NOTE — Telephone Encounter (Signed)
Reviewed. cdm 

## 2012-10-04 ENCOUNTER — Ambulatory Visit: Payer: 59 | Attending: Orthopedic Surgery

## 2012-10-04 DIAGNOSIS — M545 Low back pain, unspecified: Secondary | ICD-10-CM | POA: Insufficient documentation

## 2012-10-04 DIAGNOSIS — IMO0001 Reserved for inherently not codable concepts without codable children: Secondary | ICD-10-CM | POA: Insufficient documentation

## 2012-10-04 DIAGNOSIS — M2569 Stiffness of other specified joint, not elsewhere classified: Secondary | ICD-10-CM | POA: Insufficient documentation

## 2012-10-10 ENCOUNTER — Ambulatory Visit: Payer: 59

## 2012-10-11 ENCOUNTER — Ambulatory Visit: Payer: 59 | Admitting: Cardiovascular Disease

## 2012-10-18 DIAGNOSIS — M62838 Other muscle spasm: Secondary | ICD-10-CM | POA: Insufficient documentation

## 2012-10-18 DIAGNOSIS — R209 Unspecified disturbances of skin sensation: Secondary | ICD-10-CM | POA: Insufficient documentation

## 2012-10-18 DIAGNOSIS — R5381 Other malaise: Secondary | ICD-10-CM | POA: Insufficient documentation

## 2012-10-25 ENCOUNTER — Ambulatory Visit (INDEPENDENT_AMBULATORY_CARE_PROVIDER_SITE_OTHER): Payer: 59 | Admitting: Internal Medicine

## 2012-10-25 ENCOUNTER — Encounter: Payer: Self-pay | Admitting: Internal Medicine

## 2012-10-25 VITALS — BP 114/82 | HR 76 | Temp 98.3°F | Ht 64.0 in | Wt 143.0 lb

## 2012-10-25 DIAGNOSIS — M6281 Muscle weakness (generalized): Secondary | ICD-10-CM | POA: Insufficient documentation

## 2012-10-25 DIAGNOSIS — R079 Chest pain, unspecified: Secondary | ICD-10-CM

## 2012-10-25 DIAGNOSIS — R202 Paresthesia of skin: Secondary | ICD-10-CM

## 2012-10-25 DIAGNOSIS — R209 Unspecified disturbances of skin sensation: Secondary | ICD-10-CM

## 2012-10-25 NOTE — Assessment & Plan Note (Signed)
Proceed with NCV/EMG.  I doubt ALS.  If neurologic work up negative, consider viral myopathy.

## 2012-10-25 NOTE — Assessment & Plan Note (Signed)
Non cardiac chest pain.  Cardiac cath was normal 09/18/2012 - Dr. Clifton James.

## 2012-10-25 NOTE — Assessment & Plan Note (Signed)
Patient has diffuse paresthesias in face, neck and extremities.  No evidence of myelopathy on MRI of cervical, thoracic and lumbar spine.  Patient recently seen by neurologist as Tammy [name].  I agree with EMG and serologic work up.  Patient would like to have NCV/EMG performed locally (in network).  Refer to Dr. Joycelyn Schmid, MD for NCV/EMG.

## 2012-10-25 NOTE — Progress Notes (Signed)
Subjective:    Patient ID: Tammy [name], female    DOB: 05/16/1961, 52 y.o.   MRN: [id number]  HPI  52 year old female to establish.  She was previously followed by Dr. Clent [name].  Since 10/2008, patient has experienced numerous unexplained "shock like sensation".  Her symptoms started after dental procedure.  It is unclear whether hyperextension for her neck caused spinal cord injury.  She describes sensation that is similar to electrocution in her face, neck and extremities. She has been followed by Dr. Charlett [name] - orthopedic specialist.  She was also evaluated by neurosurgeon - Dr. Danielle [name] who thought she may have suffered contusion of cervical spine.   Patient had MRI of c spine and MRI of Brain.    MRI of C spine 12/17/2010 showed - multilevel disc degeneration and spondylosis.  Patient noted to have moderate spinal stenosis at C4-5 and C5-6.  MRI of brain was unremarkable.    MRI of thoracic and L spine was completed 11/05/2011 -  It showed mild thoraic spondylosis with multilevel disc bulging.  L spine - L4-5 predominant lumbar spondylosis. Moderate central stenosis and bilateral lateral recess stenosis.  Central annular tear and broad based disc protrusion.    Patient started on gabapentin.   It helps but does not completely alleviate her pain.  Over next 2 years she also developed weakness in her legs and developed difficulty walking for more than 20 minutes.  Her lack of stamina severely limited her work and home activities.  In 2013, she developed further weakness in arms and legs.  She has difficulty opening doors in the hospital.  She tried physical therapy with no improvement.     She received ESI in lumbar and cervical spine.  It also did not improve her symptoms.  In November 2013, she started to develop random fasciculations in her muscles.  Muscle twitching can affect any muscle group and is worsened by exertion.  Her symptoms only apparent when she is at rest.    In December, she  developed significant unexplained chest pain and shortness of breath.  She underwent cardiac cath.  It was unremarkable.  She has normal EF.    She has been deteriorating functionally.  She has difficulty making it through work day.  She is working 2 days per week.  She works as Tax inspector.  She reports severe fatigue and poor stamina.  She was recently evaluated by Neurologist at [facility].  (Dr. Orie [name]).  EMG is planned.  We are awaiting results of B12 level, TSH, RPR, sed rate, copper level, ceruloplasmin - SO, ANA screen, CPK, and lyme disease Abs panel.    Review of Systems  Constitutional: Negative for activity change, appetite change and unexpected weight change.  Eyes: Negative for visual disturbance.  Respiratory: Negative for cough, intermittent chest tightness and shortness of breath.   Cardiovascular: History of chest pain.  Genitourinary: Negative for difficulty urinating.  Neurological: Negative for headaches.  Gastrointestinal: Negative for abdominal pain, abdominal bloating, diarrhea.  She has hx of chronic mild constipation. Negative for nausea or vomiting. 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 Neuropathy   . High cholesterol     History   Social History  . Marital Status: Married    Spouse Name: N/A    Number of Children: N/A  . Years of Education: N/A   Occupational History  .  Not on file.   Social History Main Topics  . Smoking status: Never Smoker   . Smokeless tobacco: Not on file  . Alcohol Use: No  . Drug Use: No  . Sexually Active: Not on file   Other Topics Concern  . Not on file   Social History Narrative  . No narrative on file    Past Surgical History  Procedure Date  . Tonsillectomy 1965  . Cardiac catheterization 09/18/2012    Family History  Problem Relation Age of Onset  . Heart attack Maternal Grandmother 50  . Arthritis  Maternal Grandmother   . Hyperlipidemia Maternal Grandmother   . Heart disease Maternal Grandmother   . Diabetes Maternal Grandmother   . Heart attack Brother     No Known Allergies  Current Outpatient Prescriptions on File Prior to Visit  Medication Sig Dispense Refill  . aspirin EC 81 MG EC tablet Take 1 tablet (81 mg total) by mouth daily.      Marland Kitchen atorvastatin (LIPITOR) 40 MG tablet Take 1 tablet (40 mg total) by mouth daily at 6 PM.  30 tablet  1  . DULoxetine (CYMBALTA) 60 MG capsule Take 60 mg by mouth daily.      Marland Kitchen gabapentin (NEURONTIN) 300 MG capsule Take 300 mg by mouth 3 (three) times daily.      . hydrochlorothiazide (HYDRODIURIL) 25 MG tablet Take 25 mg by mouth daily.      . nitroGLYCERIN (NITROSTAT) 0.4 MG SL tablet Place 1 tablet (0.4 mg total) under the tongue every 5 (five) minutes as needed for chest pain.  45 tablet  0  . traMADol (ULTRAM) 50 MG tablet Take 12.5-50 mg by mouth every 6 (six) hours as needed. For pain      . zolpidem (AMBIEN) 10 MG tablet Take 10 mg by mouth at bedtime as needed. For sleep        BP 114/82  Pulse 76  Temp 98.3 F (36.8 C) (Oral)  Ht 5\' 4"  (1.626 m)  Wt 143 lb (64.864 kg)  BMI 24.55 kg/m2  LMP 11/05/2007     Objective:   Physical Exam  Constitutional: She is oriented to person, place, and time. She appears well-developed and well-nourished.  HENT:  Head: Normocephalic and atraumatic.  Right Ear: External ear normal.  Left Ear: External ear normal.  Mouth/Throat: Oropharynx is clear and moist.  Eyes: EOM are normal. Pupils are equal, round, and reactive to light.  Neck: Normal range of motion. Neck supple.       No carotid bruit  Cardiovascular: Normal rate, regular rhythm, normal heart sounds and intact distal pulses.   No murmur heard. Pulmonary/Chest: Effort normal and breath sounds normal. She has no wheezes.  Abdominal: Soft. Bowel sounds are normal. There is no tenderness.  Musculoskeletal: Normal range of motion.  She exhibits no edema.  Neurological: She is alert and oriented to person, place, and time. No cranial nerve deficit. She exhibits normal muscle tone.       difficulty with performing squats Pattellar and achilles reflex +3 bilaterally Upper ext reflexes +2 bilaterally   Skin: Skin is warm and dry.  Psychiatric: She has a normal mood and affect. Her behavior is normal.          Assessment & Plan:

## 2012-11-04 ENCOUNTER — Other Ambulatory Visit: Payer: Self-pay

## 2012-11-08 ENCOUNTER — Ambulatory Visit: Payer: 59 | Admitting: Cardiovascular Disease

## 2012-11-22 ENCOUNTER — Ambulatory Visit: Payer: 59 | Admitting: Internal Medicine

## 2012-11-22 ENCOUNTER — Other Ambulatory Visit (INDEPENDENT_AMBULATORY_CARE_PROVIDER_SITE_OTHER): Payer: 59

## 2012-11-22 DIAGNOSIS — Z209 Contact with and (suspected) exposure to unspecified communicable disease: Secondary | ICD-10-CM

## 2012-11-29 ENCOUNTER — Ambulatory Visit (INDEPENDENT_AMBULATORY_CARE_PROVIDER_SITE_OTHER): Payer: 59 | Admitting: Internal Medicine

## 2012-11-29 ENCOUNTER — Encounter: Payer: Self-pay | Admitting: Internal Medicine

## 2012-11-29 VITALS — BP 108/80 | HR 82 | Temp 97.9°F | Wt 146.0 lb

## 2012-11-29 DIAGNOSIS — E876 Hypokalemia: Secondary | ICD-10-CM

## 2012-11-29 DIAGNOSIS — R0602 Shortness of breath: Secondary | ICD-10-CM | POA: Insufficient documentation

## 2012-11-29 DIAGNOSIS — M6281 Muscle weakness (generalized): Secondary | ICD-10-CM

## 2012-11-29 MED ORDER — PANTOPRAZOLE SODIUM 40 MG PO TBEC: 40.0000 mg | DELAYED_RELEASE_TABLET | Freq: Every day | ORAL | Status: DC

## 2012-11-29 MED ORDER — ZOLPIDEM TARTRATE 10 MG PO TABS: 10.0000 mg | ORAL_TABLET | Freq: Every evening | ORAL | Status: DC | PRN

## 2012-11-29 MED ORDER — ALBUTEROL SULFATE HFA 108 (90 BASE) MCG/ACT IN AERS: 2.0000 | INHALATION_SPRAY | Freq: Four times a day (QID) | RESPIRATORY_TRACT | Status: DC | PRN

## 2012-11-29 NOTE — Assessment & Plan Note (Signed)
She has not complete EMG testing due to financial reasons.  Awaiting enterovirus and HHV 6 titers.

## 2012-11-29 NOTE — Assessment & Plan Note (Signed)
52 year old has chronic unexplained shortness of breath. Spirometry shows normal lung capacity.  However, she has decreased FEF 25-75% which may suggest small airway disease. Trial of Ventolin inhaler 2 puffs every 6 hours as needed. She also has atypical chest pain. Her previous cardiac workup was negative. We discussed possibility of gastrointestinal etiology. Trial of Protonix 40 mg once daily. Reassess in one month.

## 2012-11-29 NOTE — Progress Notes (Signed)
Subjective:    Patient ID: Tammy [name], female    DOB: 13-Oct-1960, 52 y.o.   MRN: [id number]  HPI  52 year old white female with history of unexplained chest pain and shortness of breath for followup. Since previous visit, patient has had deterioration of her symptoms. She has noticed increased fatigue and shortness of breath with strenuous activity. She currently works as a Tax inspector. She was unable to work Friday due to severe fatigue. Any kind of physical activity seems to make her symptoms worse.  Her current symptoms are different than chest pain she experienced in the past before her cardiac cath. She describes a generalized chest tightness. She feels like she can't take in a deep breath. She denies any wheezing or cough.  Review of Systems Negative for reflux symptoms.  Negative for palpitations.  She has chronic twitching of random muscles.    Past Medical History  Diagnosis Date  . Neuropathy   . High cholesterol     History   Social History  . Marital Status: Married    Spouse Name: N/A    Number of Children: N/A  . Years of Education: N/A   Occupational History  . Not on file.   Social History Main Topics  . Smoking status: Never Smoker   . Smokeless tobacco: Not on file  . Alcohol Use: No  . Drug Use: No  . Sexually Active: Not on file   Other Topics Concern  . Not on file   Social History Narrative  . No narrative on file    Past Surgical History  Procedure Laterality Date  . Tonsillectomy  1965  . Cardiac catheterization  09/18/2012    Family History  Problem Relation Age of Onset  . Heart attack Maternal Grandmother 50  . Arthritis Maternal Grandmother   . Hyperlipidemia Maternal Grandmother   . Heart disease Maternal Grandmother   . Diabetes Maternal Grandmother   . Heart attack Brother     No Known Allergies  Current Outpatient Prescriptions on File Prior to Visit  Medication Sig Dispense Refill  . aspirin EC 81 MG EC  tablet Take 1 tablet (81 mg total) by mouth daily.      . DULoxetine (CYMBALTA) 60 MG capsule Take 60 mg by mouth daily.      Marland Kitchen gabapentin (NEURONTIN) 300 MG capsule Take 300 mg by mouth 3 (three) times daily.      . hydrochlorothiazide (HYDRODIURIL) 25 MG tablet Take 25 mg by mouth daily.      . nitroGLYCERIN (NITROSTAT) 0.4 MG SL tablet Place 1 tablet (0.4 mg total) under the tongue every 5 (five) minutes as needed for chest pain.  45 tablet  0  . traMADol (ULTRAM) 50 MG tablet Take 12.5-50 mg by mouth every 6 (six) hours as needed. For pain       No current facility-administered medications on file prior to visit.    BP 108/80  Pulse 82  Temp(Src) 97.9 F (36.6 C) (Oral)  Wt 146 lb (66.225 kg)  BMI 25.05 kg/m2  SpO2 97%  LMP 11/05/2007    Objective:   Physical Exam  Constitutional: She is oriented to person, place, and time. She appears well-developed and well-nourished.  HENT:  Head: Normocephalic and atraumatic.  Cardiovascular: Normal rate, regular rhythm and normal heart sounds.   Pulmonary/Chest: Effort normal and breath sounds normal. She has no wheezes. She exhibits no tenderness.  Chest expands normally  Abdominal: Soft. Bowel sounds are normal. There is  no tenderness.  Neurological: She is alert and oriented to person, place, and time. No cranial nerve deficit.  Psychiatric: She has a normal mood and affect. Her behavior is normal.          Assessment & Plan:

## 2012-12-02 LAB — HERPES VIRUS 6 AB, IGG: Herpesvirus-6 IgG Ab: 1:320 {titer} — AB

## 2012-12-02 LAB — HERPESVIRUS 6 HUMAN IGM: Herpesvirus-6, Human IgM Antibodies: <1:40 {titer}

## 2012-12-19 ENCOUNTER — Ambulatory Visit: Payer: 59 | Admitting: Cardiovascular Disease

## 2013-01-08 ENCOUNTER — Encounter: Payer: Self-pay | Admitting: Internal Medicine

## 2013-01-16 ENCOUNTER — Encounter: Payer: Self-pay | Admitting: Internal Medicine

## 2013-01-17 ENCOUNTER — Telehealth: Payer: Self-pay | Admitting: Internal Medicine

## 2013-01-17 NOTE — Telephone Encounter (Signed)
Pt returning your call and would like for you to call her back this afternoon.

## 2013-02-22 ENCOUNTER — Encounter: Payer: Self-pay | Admitting: Internal Medicine

## 2013-02-22 ENCOUNTER — Ambulatory Visit (INDEPENDENT_AMBULATORY_CARE_PROVIDER_SITE_OTHER): Payer: 59 | Admitting: Internal Medicine

## 2013-02-22 VITALS — BP 98/74 | HR 64 | Temp 98.1°F | Wt 145.0 lb

## 2013-02-22 DIAGNOSIS — G933 Postviral fatigue syndrome: Secondary | ICD-10-CM

## 2013-02-22 DIAGNOSIS — G894 Chronic pain syndrome: Secondary | ICD-10-CM

## 2013-02-22 DIAGNOSIS — G4700 Insomnia, unspecified: Secondary | ICD-10-CM

## 2013-02-22 DIAGNOSIS — G0490 Encephalitis and encephalomyelitis, unspecified: Secondary | ICD-10-CM

## 2013-02-22 DIAGNOSIS — F5104 Psychophysiologic insomnia: Secondary | ICD-10-CM

## 2013-02-22 DIAGNOSIS — R5382 Chronic fatigue, unspecified: Secondary | ICD-10-CM | POA: Insufficient documentation

## 2013-02-22 MED ORDER — PREGABALIN 100 MG PO CAPS: 100.0000 mg | ORAL_CAPSULE | Freq: Two times a day (BID) | ORAL | Status: DC

## 2013-02-22 MED ORDER — CLONAZEPAM 1 MG PO TABS: 1.0000 mg | ORAL_TABLET | Freq: Every evening | ORAL | Status: DC | PRN

## 2013-02-22 MED ORDER — PREGABALIN 75 MG PO CAPS: 75.0000 mg | ORAL_CAPSULE | Freq: Two times a day (BID) | ORAL | Status: DC

## 2013-02-22 NOTE — Assessment & Plan Note (Addendum)
Patient has chronic musculoskeletal pain.  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 Continue same dose of Cymbalta 60 mg once daily. Discontinue gabapentin. Switche to Lyrica 75 mg twice daily. Continue tramadol for now. Patient to try adding tylenol 325 mg to either one half tab a full tablet tramadol. Reassess in one month. If no improvement in pain symptoms, we discussed trying Nucynta.  Patient concerned that mycotic type pain medications may affect her mental abilities for work.

## 2013-02-22 NOTE — Assessment & Plan Note (Signed)
Patient still experiences severe post exertional fatigue. She also has chronic sleep issues and pain syndrome.  We discussed various theories about etiology of chronic fatigue syndrome / ME.  Dr. Braulio Bosch at [facility] has had some success with Valcyte.  We discussed pros and cons.  Patient is trying chinese herbal product.  I suggest we monitor her CBCD, BMET, and LFTs.

## 2013-02-22 NOTE — Assessment & Plan Note (Signed)
She suffers from chronic insomnia. She has developed tolerance to Ambien and also suspects it may be interfering with short term memory. Switch to clonazepam 1 mg. She will try taking one half tablet at bedtime along with melatonin 1.5 mg over-the-counter. Goal is 6 to 7 hours of quality sleep. Patient understands to titrate to full 1 mg of clonazepam if persistent sleep issues.

## 2013-02-22 NOTE — Progress Notes (Signed)
Subjective:    Patient ID: Tammy [name], female    DOB: 08/15/1961, 52 y.o.   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  Since previous visit patient completed lab testing. 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 Patient's HHV 6 antibodies also elevated.  Patient has been experiencing ongoing musculoskeletal pains. She has history of electric-like sensations that started 4 years ago in her upper thoracic, neck, bilateral shoulder and bilateral lower extremities. She has been working with integrated therapies to avoid using narcotic pain medications. She use tramadol 50 mg 1 to 2 tabs per day.  Patient reports her overall pain has gotten  worse. She also is taking Cymbalta 60 mg once daily and gabapentin 300 mg 2-3 times per day. She has tried to taper gabapentin dose in the past without success.  She also complains of ongoing sleep issues. This is despite taking Ambien 10 mg at bedtime. Her sleep is often disturbed by chronic pain.  Current dose of gabapentin causes mental dullness.  She has seen rheumotologist in the past. Her inflammatory markers have always been completely normal.   Review of Systems Negative for fluid retention. Occasional anxiety symptoms.  Her work schedule with be changing in Aug.  She accepted new position.    Past Medical History  Diagnosis Date  . Neuropathy   . High cholesterol     History   Social History  . Marital Status: Married    Spouse Name: N/A    Number of Children: N/A  . Years of Education: N/A   Occupational History  . Not on file.   Social History Main Topics  . Smoking status: Never Smoker   . Smokeless tobacco: Not on file  . Alcohol Use: No  . Drug Use: No  . Sexually Active: Not on file   Other Topics Concern  . Not on file   Social History Narrative  . No narrative on file     Past Surgical History  Procedure Laterality Date  . Tonsillectomy  1965  . Cardiac catheterization  09/18/2012    Family History  Problem Relation Age of Onset  . Heart attack Maternal Grandmother 50  . Arthritis Maternal Grandmother   . Hyperlipidemia Maternal Grandmother   . Heart disease Maternal Grandmother   . Diabetes Maternal Grandmother   . Heart attack Brother     No Known Allergies  Current Outpatient Prescriptions on File Prior to Visit  Medication Sig Dispense Refill  . albuterol (PROVENTIL HFA;VENTOLIN HFA) 108 (90 BASE) MCG/ACT inhaler Inhale 2 puffs into the lungs every 6 (six) hours as needed for shortness of breath.  1 Inhaler  2  . aspirin EC 81 MG EC tablet Take 1 tablet (81 mg total) by mouth daily.      . DULoxetine (CYMBALTA) 60 MG capsule Take 60 mg by mouth daily.      . hydrochlorothiazide (HYDRODIURIL) 25 MG tablet Take 25 mg by mouth daily.      . nitroGLYCERIN (NITROSTAT) 0.4 MG SL tablet Place 1 tablet (0.4 mg total) under the tongue every 5 (five) minutes as needed for chest pain.  45 tablet  0  . traMADol (ULTRAM) 50 MG tablet Take 12.5-50 mg by mouth every 6 (six) hours as needed. For pain       No current facility-administered medications on file prior to visit.  BP 98/74  Pulse 64  Temp(Src) 98.1 F (36.7 C) (Oral)  Wt 145 lb (65.772 kg)  BMI 24.88 kg/m2  LMP 11/05/2007    Objective:   Physical Exam  Constitutional: She is oriented to person, place, and time. She appears well-developed and well-nourished.  HENT:  Head: Normocephalic and atraumatic.  Eyes: EOM are normal.  Cardiovascular: Normal rate and normal heart sounds.   Pulmonary/Chest: Effort normal and breath sounds normal. She has no wheezes.  Musculoskeletal: She exhibits no edema.  Neurological: She is alert and oriented to person, place, and time. No cranial nerve deficit.  Skin: Skin is warm and dry.  Psychiatric: She has a normal mood and affect. Her behavior  is normal.          Assessment & Plan:

## 2013-02-26 ENCOUNTER — Encounter: Payer: Self-pay | Admitting: *Deleted

## 2013-02-26 ENCOUNTER — Ambulatory Visit (INDEPENDENT_AMBULATORY_CARE_PROVIDER_SITE_OTHER): Payer: 59 | Admitting: Internal Medicine

## 2013-02-26 ENCOUNTER — Encounter: Payer: Self-pay | Admitting: Internal Medicine

## 2013-02-26 VITALS — BP 106/82 | Temp 97.5°F | Wt 145.0 lb

## 2013-02-26 DIAGNOSIS — G4700 Insomnia, unspecified: Secondary | ICD-10-CM

## 2013-02-26 DIAGNOSIS — F5104 Psychophysiologic insomnia: Secondary | ICD-10-CM

## 2013-02-26 DIAGNOSIS — R5382 Chronic fatigue, unspecified: Secondary | ICD-10-CM

## 2013-02-26 MED ORDER — LUBIPROSTONE 8 MCG PO CAPS: 8.0000 ug | ORAL_CAPSULE | Freq: Two times a day (BID) | ORAL | Status: DC

## 2013-02-26 MED ORDER — ONDANSETRON HCL 4 MG PO TABS: 4.0000 mg | ORAL_TABLET | Freq: Two times a day (BID) | ORAL | Status: DC | PRN

## 2013-02-26 NOTE — Assessment & Plan Note (Signed)
Patient was experiencing short-term memory issues which may be attributable to long-term use of Ambien. Patient having some withdrawal symptoms. Taper off Ambien slowly over next 2-4 weeks. Use half Ambien and half clonazepam as directed.

## 2013-02-26 NOTE — Assessment & Plan Note (Signed)
Patient has not noticed any worsening muscle symptoms since switching from gabapentin to Lyrica. Monitor for now. Consider titrating to 100 mg twice daily.

## 2013-02-26 NOTE — Progress Notes (Signed)
  Subjective:    Patient ID: Tammy [name], female    DOB: 01/20/1961, 52 y.o.   MRN: [id number]  HPI  52 year old white female with history of chronic severe fatigue for followup. At previous visit, patient's Ambien was discontinued and she was switched to clonazepam. Patient's tried to taper off Ambien in the past. She experienced withdrawal symptoms including flushing sensation in her chest.  She switched from gabapentin to Lyrica with good response.  Review of Systems Sleep issues causing fatigue and weakness relapse  Past Medical History  Diagnosis Date  . Neuropathy   . High cholesterol     History   Social History  . Marital Status: Married    Spouse Name: N/A    Number of Children: N/A  . Years of Education: N/A   Occupational History  . Not on file.   Social History Main Topics  . Smoking status: Never Smoker   . Smokeless tobacco: Not on file  . Alcohol Use: No  . Drug Use: No  . Sexually Active: Not on file   Other Topics Concern  . Not on file   Social History Narrative  . No narrative on file    Past Surgical History  Procedure Laterality Date  . Tonsillectomy  1965  . Cardiac catheterization  09/18/2012    Family History  Problem Relation Age of Onset  . Heart attack Maternal Grandmother 50  . Arthritis Maternal Grandmother   . Hyperlipidemia Maternal Grandmother   . Heart disease Maternal Grandmother   . Diabetes Maternal Grandmother   . Heart attack Brother     No Known Allergies  Current Outpatient Prescriptions on File Prior to Visit  Medication Sig Dispense Refill  . albuterol (PROVENTIL HFA;VENTOLIN HFA) 108 (90 BASE) MCG/ACT inhaler Inhale 2 puffs into the lungs every 6 (six) hours as needed for shortness of breath.  1 Inhaler  2  . aspirin EC 81 MG EC tablet Take 1 tablet (81 mg total) by mouth daily.      . clonazePAM (KLONOPIN) 1 MG tablet Take 1 tablet (1 mg total) by mouth at bedtime as needed.  90 tablet  1  . DULoxetine  (CYMBALTA) 60 MG capsule Take 60 mg by mouth daily.      . hydrochlorothiazide (HYDRODIURIL) 25 MG tablet Take 25 mg by mouth daily.      . nitroGLYCERIN (NITROSTAT) 0.4 MG SL tablet Place 1 tablet (0.4 mg total) under the tongue every 5 (five) minutes as needed for chest pain.  45 tablet  0  . pregabalin (LYRICA) 75 MG capsule Take 1 capsule (75 mg total) by mouth 2 (two) times daily.  180 capsule  1  . traMADol (ULTRAM) 50 MG tablet Take 12.5-50 mg by mouth every 6 (six) hours as needed. For pain       No current facility-administered medications on file prior to visit.    BP 106/82  Temp(Src) 97.5 F (36.4 C) (Oral)  Wt 145 lb (65.772 kg)  BMI 24.88 kg/m2  LMP 11/05/2007       Objective:   Physical Exam  Constitutional: She is oriented to person, place, and time. She appears well-developed and well-nourished.  Cardiovascular: Normal rate, regular rhythm and normal heart sounds.   Pulmonary/Chest: Effort normal and breath sounds normal. She has no wheezes.  Neurological: She is alert and oriented to person, place, and time. No cranial nerve deficit.          Assessment & Plan:

## 2013-02-26 NOTE — Patient Instructions (Addendum)
Take 1/2 of zolpidem 10 mg and .5 mg of clonazem at bedtime as need for sleep.   Taper off zolpidem over 2-4 weeks.   Please contact our office if your symptoms do not improve or gets worse.

## 2013-03-22 ENCOUNTER — Ambulatory Visit: Payer: 59 | Admitting: Internal Medicine

## 2013-03-26 ENCOUNTER — Encounter: Payer: Self-pay | Admitting: Internal Medicine

## 2013-03-30 ENCOUNTER — Ambulatory Visit: Payer: 59 | Admitting: Internal Medicine

## 2013-04-02 ENCOUNTER — Ambulatory Visit: Payer: 59 | Admitting: Internal Medicine

## 2013-04-05 ENCOUNTER — Ambulatory Visit: Payer: 59 | Admitting: Internal Medicine

## 2013-04-16 ENCOUNTER — Ambulatory Visit (INDEPENDENT_AMBULATORY_CARE_PROVIDER_SITE_OTHER): Payer: 59 | Admitting: Internal Medicine

## 2013-04-16 ENCOUNTER — Other Ambulatory Visit: Payer: Self-pay | Admitting: *Deleted

## 2013-04-16 ENCOUNTER — Encounter: Payer: Self-pay | Admitting: Internal Medicine

## 2013-04-16 VITALS — BP 120/82 | HR 83 | Temp 98.7°F | Resp 20 | Wt 157.0 lb

## 2013-04-16 DIAGNOSIS — R5382 Chronic fatigue, unspecified: Secondary | ICD-10-CM

## 2013-04-16 DIAGNOSIS — F5104 Psychophysiologic insomnia: Secondary | ICD-10-CM

## 2013-04-16 DIAGNOSIS — G4700 Insomnia, unspecified: Secondary | ICD-10-CM

## 2013-04-16 DIAGNOSIS — G894 Chronic pain syndrome: Secondary | ICD-10-CM

## 2013-04-16 MED ORDER — HYDROCHLOROTHIAZIDE 25 MG PO TABS: 25.0000 mg | ORAL_TABLET | Freq: Every day | ORAL | Status: DC

## 2013-04-16 MED ORDER — ZOLPIDEM TARTRATE 10 MG PO TABS: 10.0000 mg | ORAL_TABLET | Freq: Every evening | ORAL | Status: DC | PRN

## 2013-04-16 MED ORDER — GABAPENTIN 100 MG PO CAPS: ORAL_CAPSULE | ORAL | Status: DC

## 2013-04-16 NOTE — Patient Instructions (Addendum)
Return in 2 months for follow-up

## 2013-04-16 NOTE — Progress Notes (Signed)
  Subjective:    Patient ID: Tammy [name], female    DOB: [birth year], 52 y.o.   MRN: [id number]  HPI  52 year old patient who is seen in followup. She has a history chronic pain and chronic fatigue. Insomnia in part related to her pain has also been an issue. She has made attempts at tapering and discontinuation of Ambien without success. At the present time she is on a regimen of Lyrica at bedtime and Neurontin in the morning. She feels this does  The  better job of controlling pain and minimizing side effects.      Review of Systems  Musculoskeletal: Positive for myalgias, back pain and arthralgias.  Psychiatric/Behavioral: Positive for sleep disturbance.       Objective:   Physical Exam  Constitutional: She appears well-developed and well-nourished. No distress.  Psychiatric: She has a normal mood and affect. Her behavior is normal.  Bright affect          Assessment & Plan:   Insomnia Chronic fatigue syndrome Chronic pain syndrome  No change in her regimen. The patient was reminded that there will be variability in her symptoms with her illnesses and to attempt to minimize changes in treatment regimens based on short-term symptoms  Recheck with PCP in 2 months or as needed

## 2013-04-26 ENCOUNTER — Encounter: Payer: Self-pay | Admitting: Internal Medicine

## 2013-04-26 ENCOUNTER — Telehealth: Payer: Self-pay | Admitting: Internal Medicine

## 2013-04-26 ENCOUNTER — Other Ambulatory Visit: Payer: Self-pay | Admitting: Internal Medicine

## 2013-04-26 DIAGNOSIS — Z5181 Encounter for therapeutic drug level monitoring: Secondary | ICD-10-CM

## 2013-04-26 DIAGNOSIS — G894 Chronic pain syndrome: Secondary | ICD-10-CM

## 2013-04-26 DIAGNOSIS — R5382 Chronic fatigue, unspecified: Secondary | ICD-10-CM

## 2013-04-26 NOTE — Telephone Encounter (Signed)
Pt asked if Dr Artist Pais would specifically give pt a call when he gets a chance.

## 2013-04-30 ENCOUNTER — Other Ambulatory Visit: Payer: 59

## 2013-05-01 NOTE — Telephone Encounter (Signed)
Left message for pt to call back  °

## 2013-05-01 NOTE — Telephone Encounter (Signed)
Dr Artist Pais has already talked to pt last week

## 2013-05-07 ENCOUNTER — Telehealth: Payer: Self-pay | Admitting: Internal Medicine

## 2013-05-07 ENCOUNTER — Other Ambulatory Visit: Payer: 59

## 2013-05-07 NOTE — Telephone Encounter (Signed)
BMET will include potassium level

## 2013-05-07 NOTE — Telephone Encounter (Signed)
Orders placed.

## 2013-05-07 NOTE — Telephone Encounter (Signed)
PT states that she is going to [facility] this afternoon for her labs, and she would like an order placed to have her potassium checked. Please assist.

## 2013-05-08 ENCOUNTER — Other Ambulatory Visit (INDEPENDENT_AMBULATORY_CARE_PROVIDER_SITE_OTHER): Payer: 59

## 2013-05-08 DIAGNOSIS — R5382 Chronic fatigue, unspecified: Secondary | ICD-10-CM

## 2013-05-08 DIAGNOSIS — Z5181 Encounter for therapeutic drug level monitoring: Secondary | ICD-10-CM

## 2013-05-08 DIAGNOSIS — G894 Chronic pain syndrome: Secondary | ICD-10-CM

## 2013-05-08 LAB — CBC WITH DIFFERENTIAL/PLATELET
Basophils Absolute: 0 10*3/uL (ref 0.0–0.1)
Basophils Relative: 0.4 % (ref 0.0–3.0)
Eosinophils Absolute: 0.1 10*3/uL (ref 0.0–0.7)
Eosinophils Relative: 3.3 % (ref 0.0–5.0)
HCT: 38.6 % (ref 36.0–46.0)
Hemoglobin: 13.3 g/dL (ref 12.0–15.0)
Lymphocytes Relative: 30.4 % (ref 12.0–46.0)
Lymphs Abs: 1.1 10*3/uL (ref 0.7–4.0)
MCHC: 34.5 g/dL (ref 30.0–36.0)
MCV: 82.7 fl (ref 78.0–100.0)
Monocytes Absolute: 0.3 10*3/uL (ref 0.1–1.0)
Monocytes Relative: 9.4 % (ref 3.0–12.0)
Neutro Abs: 2.1 10*3/uL (ref 1.4–7.7)
Neutrophils Relative %: 56.5 % (ref 43.0–77.0)
Platelets: 181 10*3/uL (ref 150.0–400.0)
RBC: 4.67 Mil/uL (ref 3.87–5.11)
RDW: 12.8 % (ref 11.5–14.6)
WBC: 3.6 10*3/uL — ABNORMAL LOW (ref 4.5–10.5)

## 2013-05-09 LAB — BASIC METABOLIC PANEL
BUN: 11 mg/dL (ref 6–23)
CO2: 31 mEq/L (ref 19–32)
Calcium: 9.4 mg/dL (ref 8.4–10.5)
Chloride: 98 mEq/L (ref 96–112)
Creatinine, Ser: 0.7 mg/dL (ref 0.4–1.2)
GFR: 89.68 mL/min (ref >60.00)
Glucose, Bld: 102 mg/dL — ABNORMAL HIGH (ref 70–99)
Potassium: 3.5 mEq/L (ref 3.5–5.1)
Sodium: 137 mEq/L (ref 135–145)

## 2013-05-09 LAB — HEPATIC FUNCTION PANEL
ALT: 17 U/L (ref 0–35)
AST: 24 U/L (ref 0–37)
Albumin: 4.5 g/dL (ref 3.5–5.2)
Alkaline Phosphatase: 46 U/L (ref 39–117)
Bilirubin, Direct: 0 mg/dL (ref 0.0–0.3)
Total Bilirubin: 0.5 mg/dL (ref 0.3–1.2)
Total Protein: 7.1 g/dL (ref 6.0–8.3)

## 2013-05-11 ENCOUNTER — Ambulatory Visit (INDEPENDENT_AMBULATORY_CARE_PROVIDER_SITE_OTHER): Payer: 59 | Admitting: Internal Medicine

## 2013-05-11 ENCOUNTER — Encounter: Payer: Self-pay | Admitting: Internal Medicine

## 2013-05-11 VITALS — BP 114/78 | HR 80 | Temp 98.2°F | Wt 149.0 lb

## 2013-05-11 DIAGNOSIS — R0600 Dyspnea, unspecified: Secondary | ICD-10-CM

## 2013-05-11 DIAGNOSIS — R0602 Shortness of breath: Secondary | ICD-10-CM

## 2013-05-11 DIAGNOSIS — G9332 Myalgic encephalomyelitis/chronic fatigue syndrome: Secondary | ICD-10-CM

## 2013-05-11 DIAGNOSIS — R0609 Other forms of dyspnea: Secondary | ICD-10-CM

## 2013-05-11 DIAGNOSIS — R5382 Chronic fatigue, unspecified: Secondary | ICD-10-CM

## 2013-05-11 NOTE — Assessment & Plan Note (Signed)
Continue pacing.  No changes in CBCD, LFTs, and BMET while taking chinese herbs.  Continue to monitor labs.

## 2013-05-11 NOTE — Progress Notes (Signed)
Subjective:    Patient ID: Tammy [name], female    DOB: [birth year], 52 y.o.   MRN: [id number]  HPI  52 year old white female with history of possible chronic fatigue syndrome and unexplained neuropathy for followup. Since previous visit patient has had a relapse. It occurred end of July after she overexerted herself performing water aerobics. Patient reports symptoms of shortness of breath, chest heaviness and difficulty taking a deep breath. Her symptoms have gradually improved over last several weeks. Patient was able to walk 2 blocks today without much difficulty.  Her relapse includes generalized fatigue. She has chronic "electrical current sensation" in upper and lower extremities. She's had previous MRI of brain, C-spine, thoracic spine and lumbar spine. Lumbar spine MRI showed spinal stenosis but does not explain her upper extremity and chest symptoms.  She completed cardiac cath in the past which was negative for coronary disease. Office spirometry was negative for obstructive disease.   She is currently taking "Equilibrant" twice daily ( chinese herb ).  No side effects noted.  Her blood work is normal with exception of pre existing mild neutropenia.  Review of Systems Negative for lid lag or ptosis.  She would like to taper neurontin dose.  "electrical current sensation" less severe  Past Medical History  Diagnosis Date  . Neuropathy   . High cholesterol     History   Social History  . Marital Status: Married    Spouse Name: N/A    Number of Children: N/A  . Years of Education: N/A   Occupational History  . Not on file.   Social History Main Topics  . Smoking status: Never Smoker   . Smokeless tobacco: Not on file  . Alcohol Use: No  . Drug Use: No  . Sexual Activity: Not on file   Other Topics Concern  . Not on file   Social History Narrative  . No narrative on file    Past Surgical History  Procedure Laterality Date  . Tonsillectomy  1965  . Cardiac  catheterization  09/18/2012    Family History  Problem Relation Age of Onset  . Heart attack Maternal Grandmother 50  . Arthritis Maternal Grandmother   . Hyperlipidemia Maternal Grandmother   . Heart disease Maternal Grandmother   . Diabetes Maternal Grandmother   . Heart attack Brother     No Known Allergies  Current Outpatient Prescriptions on File Prior to Visit  Medication Sig Dispense Refill  . albuterol (PROVENTIL HFA;VENTOLIN HFA) 108 (90 BASE) MCG/ACT inhaler Inhale 2 puffs into the lungs every 6 (six) hours as needed for shortness of breath.  1 Inhaler  2  . aspirin EC 81 MG EC tablet Take 1 tablet (81 mg total) by mouth daily.      . DULoxetine (CYMBALTA) 60 MG capsule Take 60 mg by mouth daily.      Marland Kitchen gabapentin (NEURONTIN) 100 MG capsule 1 tablet daily in the morning  90 capsule  2  . gabapentin (NEURONTIN) 300 MG capsule Take 300 mg by mouth 3 (three) times daily.      . hydrochlorothiazide (HYDRODIURIL) 25 MG tablet Take 1 tablet (25 mg total) by mouth daily.  90 tablet  1  . nitroGLYCERIN (NITROSTAT) 0.4 MG SL tablet Place 1 tablet (0.4 mg total) under the tongue every 5 (five) minutes as needed for chest pain.  45 tablet  0  . traMADol (ULTRAM) 50 MG tablet Take 12.5-50 mg by mouth every 6 (six) hours as needed. For  pain      . zolpidem (AMBIEN) 10 MG tablet Take 1 tablet (10 mg total) by mouth at bedtime as needed for sleep.  90 tablet  1   No current facility-administered medications on file prior to visit.    BP 114/78  Pulse 80  Temp(Src) 98.2 F (36.8 C) (Oral)  Wt 149 lb (67.586 kg)  BMI 25.56 kg/m2  LMP 11/05/2007       Objective:   Physical Exam  Constitutional: She is oriented to person, place, and time. She appears well-developed and well-nourished.  Cardiovascular: Normal rate, regular rhythm and normal heart sounds.   No murmur heard. Pulmonary/Chest: Effort normal and breath sounds normal. She has no wheezes. She has no rales.  Normal chest  excursion  Neurological: She is alert and oriented to person, place, and time. No cranial nerve deficit.  Psychiatric: She has a normal mood and affect. Her behavior is normal.          Assessment & Plan:

## 2013-05-11 NOTE — Assessment & Plan Note (Signed)
Patient has persistent exertional chest tightness. She describes difficulty taking a deep breath. Obtain full pulmonary function tests including MIP.  Previous chest x-ray December of 2013 was unremarkable. Her previous cardiac testing including cardiac cath was normal.

## 2013-06-06 ENCOUNTER — Encounter (INDEPENDENT_AMBULATORY_CARE_PROVIDER_SITE_OTHER): Payer: 59

## 2013-06-06 DIAGNOSIS — R0609 Other forms of dyspnea: Secondary | ICD-10-CM

## 2013-06-06 DIAGNOSIS — R0600 Dyspnea, unspecified: Secondary | ICD-10-CM

## 2013-06-06 LAB — PULMONARY FUNCTION TEST

## 2013-06-11 ENCOUNTER — Encounter: Payer: Self-pay | Admitting: Internal Medicine

## 2013-06-12 MED ORDER — GABAPENTIN 100 MG PO CAPS: 200.0000 mg | ORAL_CAPSULE | Freq: Three times a day (TID) | ORAL | Status: DC

## 2013-06-18 ENCOUNTER — Ambulatory Visit: Payer: 59 | Admitting: Internal Medicine

## 2013-07-09 ENCOUNTER — Encounter: Payer: Self-pay | Admitting: Internal Medicine

## 2013-07-09 DIAGNOSIS — M25511 Pain in right shoulder: Secondary | ICD-10-CM

## 2013-07-09 DIAGNOSIS — M549 Dorsalgia, unspecified: Secondary | ICD-10-CM

## 2013-07-09 DIAGNOSIS — M542 Cervicalgia: Secondary | ICD-10-CM

## 2013-07-09 NOTE — Telephone Encounter (Signed)
Referral order placed.

## 2013-07-10 ENCOUNTER — Ambulatory Visit: Payer: 59 | Attending: Internal Medicine | Admitting: Physical Therapy

## 2013-07-10 DIAGNOSIS — M25519 Pain in unspecified shoulder: Secondary | ICD-10-CM | POA: Insufficient documentation

## 2013-07-10 DIAGNOSIS — IMO0001 Reserved for inherently not codable concepts without codable children: Secondary | ICD-10-CM | POA: Insufficient documentation

## 2013-07-10 DIAGNOSIS — M545 Low back pain, unspecified: Secondary | ICD-10-CM | POA: Insufficient documentation

## 2013-07-10 DIAGNOSIS — M542 Cervicalgia: Secondary | ICD-10-CM | POA: Insufficient documentation

## 2013-07-10 DIAGNOSIS — M6281 Muscle weakness (generalized): Secondary | ICD-10-CM | POA: Insufficient documentation

## 2013-07-17 ENCOUNTER — Ambulatory Visit: Payer: 59 | Admitting: Physical Therapy

## 2013-07-20 ENCOUNTER — Ambulatory Visit: Payer: 59 | Admitting: Physical Therapy

## 2013-07-23 ENCOUNTER — Encounter: Payer: 59 | Admitting: Physical Therapy

## 2013-07-24 ENCOUNTER — Ambulatory Visit: Payer: 59 | Attending: Internal Medicine | Admitting: Physical Therapy

## 2013-07-24 DIAGNOSIS — M545 Low back pain, unspecified: Secondary | ICD-10-CM | POA: Insufficient documentation

## 2013-07-24 DIAGNOSIS — M6281 Muscle weakness (generalized): Secondary | ICD-10-CM | POA: Insufficient documentation

## 2013-07-24 DIAGNOSIS — M25519 Pain in unspecified shoulder: Secondary | ICD-10-CM | POA: Insufficient documentation

## 2013-07-24 DIAGNOSIS — IMO0001 Reserved for inherently not codable concepts without codable children: Secondary | ICD-10-CM | POA: Insufficient documentation

## 2013-07-24 DIAGNOSIS — M542 Cervicalgia: Secondary | ICD-10-CM | POA: Insufficient documentation

## 2013-07-25 ENCOUNTER — Encounter: Payer: 59 | Admitting: Physical Therapy

## 2013-07-25 ENCOUNTER — Ambulatory Visit (INDEPENDENT_AMBULATORY_CARE_PROVIDER_SITE_OTHER): Payer: 59 | Admitting: Internal Medicine

## 2013-07-25 ENCOUNTER — Encounter: Payer: Self-pay | Admitting: Internal Medicine

## 2013-07-25 VITALS — BP 110/80 | HR 72 | Temp 98.2°F | Resp 16 | Ht 64.0 in | Wt 142.0 lb

## 2013-07-25 DIAGNOSIS — G894 Chronic pain syndrome: Secondary | ICD-10-CM

## 2013-07-25 DIAGNOSIS — R0602 Shortness of breath: Secondary | ICD-10-CM

## 2013-07-25 MED ORDER — TAPENTADOL HCL ER 50 MG PO TB12: 50.0000 mg | ORAL_TABLET | Freq: Two times a day (BID) | ORAL | Status: DC

## 2013-07-25 MED ORDER — TRAMADOL HCL 50 MG PO TABS: 25.0000 mg | ORAL_TABLET | Freq: Two times a day (BID) | ORAL | Status: DC | PRN

## 2013-07-25 NOTE — Assessment & Plan Note (Addendum)
Lyrica did not result in any significant improvement.  Continue same dose of Cymbalta 60 mg.  We discussed possibility of serotonin syndrome with concominant use of Cymbalta and tramadol.  Trial of Nucynta ER 50 mg bid.  Use tramadol 50 mg once daily as needed.  Patient advised to try to use dictation software vs typing her office notes.

## 2013-07-25 NOTE — Assessment & Plan Note (Signed)
PFTs normal.  Shortness of breath improved with changing job function.  She is taking short walks daily w/o difficulty. Continue albuterol as needed.

## 2013-07-25 NOTE — Progress Notes (Signed)
Subjective:    Patient ID: Tammy [name], female    DOB: [birth year], 52 y.o.   MRN: [id number]  Hyperlipidemia    52 y/o white female with hx of CFS, chronic pain syndrome and shortness of breath for follow up.  Patient completed PFTs.  It showed normal lung volumes, normal diffusion and mild airway obstruction.  Patient reports SOB improved since changing jobs.  She is now working as Environmental health practitioner.   It involves less walking but more sitting.  She has experienced exacerbation in her back pain.  She is working with PT / integrative medicine.    She has been using Tramadol 50 mg bid to tid for last 1-2 yrs.  She increased to tid because of increased back pain from prolonged sitting.  She has made work environment adjustments.  Review of Systems No fever or chills, fatigue is stable  Past Medical History  Diagnosis Date  . Neuropathy   . High cholesterol     History   Social History  . Marital Status: Married    Spouse Name: N/A    Number of Children: N/A  . Years of Education: N/A   Occupational History  . Not on file.   Social History Main Topics  . Smoking status: Never Smoker   . Smokeless tobacco: Not on file  . Alcohol Use: No  . Drug Use: No  . Sexual Activity: Not on file   Other Topics Concern  . Not on file   Social History Narrative  . No narrative on file    Past Surgical History  Procedure Laterality Date  . Tonsillectomy  1965  . Cardiac catheterization  09/18/2012    Family History  Problem Relation Age of Onset  . Heart attack Maternal Grandmother 50  . Arthritis Maternal Grandmother   . Hyperlipidemia Maternal Grandmother   . Heart disease Maternal Grandmother   . Diabetes Maternal Grandmother   . Heart attack Brother     No Known Allergies  Current Outpatient Prescriptions on File Prior to Visit  Medication Sig Dispense Refill  . albuterol (PROVENTIL HFA;VENTOLIN HFA) 108 (90 BASE) MCG/ACT inhaler Inhale 2 puffs into the  lungs every 6 (six) hours as needed for shortness of breath.  1 Inhaler  2  . aspirin EC 81 MG EC tablet Take 1 tablet (81 mg total) by mouth daily.      . DULoxetine (CYMBALTA) 60 MG capsule Take 60 mg by mouth daily.      Marland Kitchen gabapentin (NEURONTIN) 100 MG capsule Take 2 capsules (200 mg total) by mouth 3 (three) times daily.  540 capsule  2  . hydrochlorothiazide (HYDRODIURIL) 25 MG tablet Take 1 tablet (25 mg total) by mouth daily.  90 tablet  1  . nitroGLYCERIN (NITROSTAT) 0.4 MG SL tablet Place 1 tablet (0.4 mg total) under the tongue every 5 (five) minutes as needed for chest pain.  45 tablet  0  . zolpidem (AMBIEN) 10 MG tablet Take 1 tablet (10 mg total) by mouth at bedtime as needed for sleep.  90 tablet  1   No current facility-administered medications on file prior to visit.    BP 110/80  Pulse 72  Temp(Src) 98.2 F (36.8 C)  Resp 16  Ht 5\' 4"  (1.626 m)  Wt 142 lb (64.411 kg)  BMI 24.36 kg/m2  LMP 11/05/2007       Objective:   Physical Exam  Constitutional: She is oriented to person, place, and time. She appears well-developed  and well-nourished. No distress.  Cardiovascular: Normal rate and regular rhythm.   No murmur heard. Pulmonary/Chest: Effort normal and breath sounds normal. She has no wheezes.  Musculoskeletal: She exhibits no edema.  Neurological: She is alert and oriented to person, place, and time. No cranial nerve deficit.  Psychiatric: She has a normal mood and affect. Her behavior is normal.          Assessment & Plan:

## 2013-07-26 ENCOUNTER — Encounter: Payer: Self-pay | Admitting: Internal Medicine

## 2013-07-30 ENCOUNTER — Encounter: Payer: Self-pay | Admitting: Internal Medicine

## 2013-07-31 ENCOUNTER — Ambulatory Visit: Payer: 59 | Admitting: Physical Therapy

## 2013-07-31 MED ORDER — CLOBETASOL PROPIONATE 0.05 % EX CREA: 1.0000 "application " | TOPICAL_CREAM | Freq: Two times a day (BID) | CUTANEOUS | Status: AC

## 2013-08-03 ENCOUNTER — Ambulatory Visit: Payer: 59 | Admitting: Physical Therapy

## 2013-08-07 ENCOUNTER — Ambulatory Visit: Payer: 59 | Admitting: Physical Therapy

## 2013-08-07 ENCOUNTER — Telehealth: Payer: Self-pay | Admitting: Internal Medicine

## 2013-08-07 NOTE — Telephone Encounter (Signed)
Pt would not elaborate. Pt is requesting MD to return her call tomorrow

## 2013-08-08 NOTE — Telephone Encounter (Signed)
Called pt - discussed use of nucynta ER.  Addressed her concerns.

## 2013-08-10 ENCOUNTER — Ambulatory Visit: Payer: 59 | Admitting: Physical Therapy

## 2013-08-14 ENCOUNTER — Ambulatory Visit: Payer: 59 | Admitting: Physical Therapy

## 2013-08-21 ENCOUNTER — Ambulatory Visit: Payer: 59 | Attending: Internal Medicine | Admitting: Physical Therapy

## 2013-08-21 DIAGNOSIS — IMO0001 Reserved for inherently not codable concepts without codable children: Secondary | ICD-10-CM | POA: Insufficient documentation

## 2013-08-21 DIAGNOSIS — M542 Cervicalgia: Secondary | ICD-10-CM | POA: Insufficient documentation

## 2013-08-21 DIAGNOSIS — M25519 Pain in unspecified shoulder: Secondary | ICD-10-CM | POA: Insufficient documentation

## 2013-08-21 DIAGNOSIS — M6281 Muscle weakness (generalized): Secondary | ICD-10-CM | POA: Insufficient documentation

## 2013-08-21 DIAGNOSIS — M545 Low back pain, unspecified: Secondary | ICD-10-CM | POA: Insufficient documentation

## 2013-08-24 ENCOUNTER — Encounter: Payer: Self-pay | Admitting: *Deleted

## 2013-08-27 ENCOUNTER — Encounter: Payer: Self-pay | Admitting: Internal Medicine

## 2013-08-27 ENCOUNTER — Ambulatory Visit (INDEPENDENT_AMBULATORY_CARE_PROVIDER_SITE_OTHER): Payer: 59 | Admitting: Internal Medicine

## 2013-08-27 VITALS — BP 112/74 | HR 64 | Temp 97.4°F | Ht 64.0 in | Wt 141.0 lb

## 2013-08-27 DIAGNOSIS — G894 Chronic pain syndrome: Secondary | ICD-10-CM

## 2013-08-27 MED ORDER — TRAMADOL HCL 50 MG PO TABS: 25.0000 mg | ORAL_TABLET | Freq: Two times a day (BID) | ORAL | Status: DC | PRN

## 2013-08-27 MED ORDER — TAPENTADOL HCL ER 50 MG PO TB12: 50.0000 mg | ORAL_TABLET | Freq: Two times a day (BID) | ORAL | Status: DC

## 2013-08-27 MED ORDER — DULOXETINE HCL 60 MG PO CPEP: 60.0000 mg | ORAL_CAPSULE | Freq: Every day | ORAL | Status: DC

## 2013-08-27 NOTE — Progress Notes (Signed)
Subjective:    Patient ID: Tammy [name], female    DOB: 1961/05/20, 52 y.o.   MRN: [id number]  HPI  52 year old white female with history of chronic fatigue syndrome and fibromyalgia for follow up.  Patient never started the Nucynta ER due to concerns of side effects. She is under the impression that Nucynta ER is stronger narcotic compared to tramadol. She's been taking tramadol as needed for the last 2-3 years.   Review of Systems No depressive symptoms    Past Medical History  Diagnosis Date  . Neuropathy   . High cholesterol     History   Social History  . Marital Status: Married    Spouse Name: N/A    Number of Children: N/A  . Years of Education: N/A   Occupational History  . Not on file.   Social History Main Topics  . Smoking status: Never Smoker   . Smokeless tobacco: Not on file  . Alcohol Use: No  . Drug Use: No  . Sexual Activity: Not on file   Other Topics Concern  . Not on file   Social History Narrative  . No narrative on file    Past Surgical History  Procedure Laterality Date  . Tonsillectomy  1965  . Cardiac catheterization  09/18/2012    Family History  Problem Relation Age of Onset  . Heart attack Maternal Grandmother 50  . Arthritis Maternal Grandmother   . Hyperlipidemia Maternal Grandmother   . Heart disease Maternal Grandmother   . Diabetes Maternal Grandmother   . Heart attack Brother     No Known Allergies  Current Outpatient Prescriptions on File Prior to Visit  Medication Sig Dispense Refill  . albuterol (PROVENTIL HFA;VENTOLIN HFA) 108 (90 BASE) MCG/ACT inhaler Inhale 2 puffs into the lungs every 6 (six) hours as needed for shortness of breath.  1 Inhaler  2  . aspirin EC 81 MG EC tablet Take 1 tablet (81 mg total) by mouth daily.      . DULoxetine (CYMBALTA) 60 MG capsule Take 60 mg by mouth daily.      Marland Kitchen gabapentin (NEURONTIN) 100 MG capsule Take 2 capsules (200 mg total) by mouth 3 (three) times daily.  540 capsule   2  . hydrochlorothiazide (HYDRODIURIL) 25 MG tablet Take 1 tablet (25 mg total) by mouth daily.  90 tablet  1  . nitroGLYCERIN (NITROSTAT) 0.4 MG SL tablet Place 1 tablet (0.4 mg total) under the tongue every 5 (five) minutes as needed for chest pain.  45 tablet  0  . Tapentadol HCl 50 MG TB12 Take 50 mg by mouth 2 (two) times daily.  60 tablet  0  . traMADol (ULTRAM) 50 MG tablet Take 0.5-1 tablets (25-50 mg total) by mouth every 12 (twelve) hours as needed. For pain  60 tablet  0  . zolpidem (AMBIEN) 10 MG tablet Take 1 tablet (10 mg total) by mouth at bedtime as needed for sleep.  90 tablet  1   No current facility-administered medications on file prior to visit.    BP 112/74  Pulse 64  Temp(Src) 97.4 F (36.3 C) (Oral)  Ht 5\' 4"  (1.626 m)  Wt 141 lb (63.957 kg)  BMI 24.19 kg/m2  LMP 11/05/2007    Objective:   Physical Exam  Constitutional: She is oriented to person, place, and time. She appears well-developed and well-nourished.  Neurological: She is alert and oriented to person, place, and time. No cranial nerve deficit.  Psychiatric:  She has a normal mood and affect. Her behavior is normal.          Assessment & Plan:

## 2013-08-27 NOTE — Assessment & Plan Note (Addendum)
Patient concerned Nucynta ER is stronger narcotic versus tramadol. We discussed mechanism of action is very similar to tramadol. I would like to avoid chronic short-term use of tramadol.  Use low dose tramadol for breakthrough pain.  Maintain same dose of cymbalta.

## 2013-08-27 NOTE — Progress Notes (Signed)
Pre visit review using our clinic review tool, if applicable. No additional management support is needed unless otherwise documented below in the visit note. 

## 2013-08-28 ENCOUNTER — Ambulatory Visit: Payer: 59 | Admitting: Physical Therapy

## 2013-09-04 ENCOUNTER — Ambulatory Visit: Payer: 59 | Admitting: Physical Therapy

## 2013-09-18 ENCOUNTER — Ambulatory Visit: Payer: 59 | Admitting: Physical Therapy

## 2013-09-25 ENCOUNTER — Encounter: Payer: 59 | Admitting: Physical Therapy

## 2013-10-22 ENCOUNTER — Encounter: Payer: Self-pay | Admitting: Internal Medicine

## 2013-10-22 ENCOUNTER — Other Ambulatory Visit: Payer: Self-pay | Admitting: Internal Medicine

## 2013-10-23 MED ORDER — TRAMADOL HCL 50 MG PO TABS: 25.0000 mg | ORAL_TABLET | Freq: Two times a day (BID) | ORAL | Status: DC | PRN

## 2013-10-23 NOTE — Telephone Encounter (Signed)
Ok per Dr Shawna Orleans for a 6 month supply, rx called in to pharmacy

## 2013-11-02 ENCOUNTER — Ambulatory Visit: Payer: 59 | Admitting: Internal Medicine

## 2013-11-24 ENCOUNTER — Encounter: Payer: Self-pay | Admitting: *Deleted

## 2014-01-29 ENCOUNTER — Encounter: Payer: Self-pay | Admitting: Internal Medicine

## 2014-01-30 MED ORDER — DULOXETINE HCL 30 MG PO CPEP
30.0000 mg | ORAL_CAPSULE | Freq: Every day | ORAL | Status: DC
Start: 2014-01-30 — End: 2014-08-22

## 2014-02-08 ENCOUNTER — Ambulatory Visit: Payer: 59 | Admitting: Internal Medicine

## 2014-02-18 ENCOUNTER — Ambulatory Visit (INDEPENDENT_AMBULATORY_CARE_PROVIDER_SITE_OTHER): Payer: 59 | Admitting: Internal Medicine

## 2014-02-18 ENCOUNTER — Encounter: Payer: Self-pay | Admitting: Internal Medicine

## 2014-02-18 VITALS — BP 120/70 | HR 88 | Temp 98.7°F | Ht 64.0 in | Wt 147.0 lb

## 2014-02-18 DIAGNOSIS — R5382 Chronic fatigue, unspecified: Secondary | ICD-10-CM

## 2014-02-18 DIAGNOSIS — G9332 Myalgic encephalomyelitis/chronic fatigue syndrome: Secondary | ICD-10-CM

## 2014-02-18 DIAGNOSIS — F5104 Psychophysiologic insomnia: Secondary | ICD-10-CM

## 2014-02-18 DIAGNOSIS — G894 Chronic pain syndrome: Secondary | ICD-10-CM

## 2014-02-18 DIAGNOSIS — I10 Essential (primary) hypertension: Secondary | ICD-10-CM

## 2014-02-18 DIAGNOSIS — G4700 Insomnia, unspecified: Secondary | ICD-10-CM

## 2014-02-18 MED ORDER — ZOLPIDEM TARTRATE 10 MG PO TABS
10.0000 mg | ORAL_TABLET | Freq: Every evening | ORAL | Status: DC | PRN
Start: 2014-02-18 — End: 2014-08-22

## 2014-02-18 MED ORDER — DULOXETINE HCL 60 MG PO CPEP: 60.0000 mg | ORAL_CAPSULE | Freq: Every day | ORAL | Status: DC

## 2014-02-18 MED ORDER — GABAPENTIN 100 MG PO CAPS: 200.0000 mg | ORAL_CAPSULE | Freq: Three times a day (TID) | ORAL | Status: DC

## 2014-02-18 MED ORDER — HYDROCHLOROTHIAZIDE 25 MG PO TABS: 25.0000 mg | ORAL_TABLET | Freq: Every day | ORAL | Status: DC

## 2014-02-18 MED ORDER — TRAMADOL HCL 50 MG PO TABS: 25.0000 mg | ORAL_TABLET | Freq: Two times a day (BID) | ORAL | Status: DC | PRN

## 2014-02-18 NOTE — Assessment & Plan Note (Signed)
Stable. Continue same dose of Hydrochlorothiazide. Monitor electrolytes and kidney function. BP: 120/70 mmHg

## 2014-02-18 NOTE — Assessment & Plan Note (Signed)
Clonazepam was too sedation.  Resume zolpidem 10 mg as needed.

## 2014-02-18 NOTE — Progress Notes (Signed)
Subjective:    Patient ID: Tammy [name], female    DOB: 07/30/1961, 53 y.o.   MRN: [id number]  HPI  53 year old white female with history of chronic fatigue syndrome and fibromyalgia for followup. Overall patient has been doing better with her fatigue. This is mainly attributable to changing jobs that involves more sitting and less walking.  She does have issues with chronic back pain and neck pain. Her symptoms worsened by prolonged sitting.  She still gets intermittent shock like sensations in her back.  She has been using tramadol 50 mg twice a day as needed as well as over-the-counter analgesics. She has been using intermittent ibuprofen. Ibuprofen causing GI upset.  She walks several times per week.  She is able to walk up to 30 minutes at a time.  She complains of intermittent right knee pain and right foot discomfort.  No redness or swelling.  Review of Systems No change in sleep pattern, no significant weight change    Past Medical History  Diagnosis Date  . Neuropathy   . High cholesterol     History   Social History  . Marital Status: Married    Spouse Name: N/A    Number of Children: N/A  . Years of Education: N/A   Occupational History  . Not on file.   Social History Main Topics  . Smoking status: Never Smoker   . Smokeless tobacco: Not on file  . Alcohol Use: No  . Drug Use: No  . Sexual Activity: Not on file   Other Topics Concern  . Not on file   Social History Narrative  . No narrative on file    Past Surgical History  Procedure Laterality Date  . Tonsillectomy  1965  . Cardiac catheterization  09/18/2012    Family History  Problem Relation Age of Onset  . Heart attack Maternal Grandmother 50  . Arthritis Maternal Grandmother   . Hyperlipidemia Maternal Grandmother   . Heart disease Maternal Grandmother   . Diabetes Maternal Grandmother   . Heart attack Brother     No Known Allergies  Current Outpatient Prescriptions on File Prior  to Visit  Medication Sig Dispense Refill  . aspirin EC 81 MG EC tablet Take 1 tablet (81 mg total) by mouth daily.      . DULoxetine (CYMBALTA) 30 MG capsule Take 1 capsule (30 mg total) by mouth daily.  90 capsule  1  . albuterol (PROVENTIL HFA;VENTOLIN HFA) 108 (90 BASE) MCG/ACT inhaler Inhale 2 puffs into the lungs every 6 (six) hours as needed for shortness of breath.  1 Inhaler  2  . nitroGLYCERIN (NITROSTAT) 0.4 MG SL tablet Place 1 tablet (0.4 mg total) under the tongue every 5 (five) minutes as needed for chest pain.  45 tablet  0   No current facility-administered medications on file prior to visit.    BP 120/70  Pulse 88  Temp(Src) 98.7 F (37.1 C) (Oral)  Ht 5\' 4"  (1.626 m)  Wt 147 lb (66.679 kg)  BMI 25.22 kg/m2  LMP 11/05/2007      Objective:   Physical Exam  Constitutional: She appears well-developed and well-nourished.  Cardiovascular: Normal rate, regular rhythm and normal heart sounds.   Pulmonary/Chest: Effort normal and breath sounds normal. She has no wheezes.  Musculoskeletal:  Right knee is stable.  No joint effusion or redness.  Negative Apley's compression test  Left on left sacral torsion, L5 rotated right Somatic dysfunction of thoracic spine between T7-T9  rotated left  Skin: Skin is warm and dry.  Psychiatric: She has a normal mood and affect. Her behavior is normal.          Assessment & Plan:

## 2014-02-18 NOTE — Assessment & Plan Note (Addendum)
She predominantly has back pain including her cervical spine.  Utilized myofascial release techniques to lumbar,thoracic, and cervical spine.  Muscle energy technique for lumbosacral dysfunction.    Patient advised to discontinue over-the-counter NSAIDs as it is likely causing NSAID gastritis. Patient to try using over-the-counter omeprazole 20 mg once a day for 4 weeks then transition to ranitidine 150 mg once to twice a day as needed.  Continue higher dose of cymbalta. Use tramadol as needed.

## 2014-02-18 NOTE — Assessment & Plan Note (Signed)
Slightly improved with changing jobs.  Patient understands to avoid over exertion.

## 2014-02-18 NOTE — Progress Notes (Signed)
Pre visit review using our clinic review tool, if applicable. No additional management support is needed unless otherwise documented below in the visit note. 

## 2014-03-25 ENCOUNTER — Telehealth: Payer: Self-pay | Admitting: Internal Medicine

## 2014-03-25 ENCOUNTER — Encounter: Payer: Self-pay | Admitting: Internal Medicine

## 2014-03-25 ENCOUNTER — Ambulatory Visit (INDEPENDENT_AMBULATORY_CARE_PROVIDER_SITE_OTHER): Payer: 59 | Admitting: Internal Medicine

## 2014-03-25 VITALS — BP 122/72 | Temp 97.9°F | Wt 147.0 lb

## 2014-03-25 DIAGNOSIS — R519 Headache, unspecified: Secondary | ICD-10-CM | POA: Insufficient documentation

## 2014-03-25 DIAGNOSIS — R141 Gas pain: Secondary | ICD-10-CM

## 2014-03-25 DIAGNOSIS — R112 Nausea with vomiting, unspecified: Secondary | ICD-10-CM

## 2014-03-25 DIAGNOSIS — R143 Flatulence: Secondary | ICD-10-CM

## 2014-03-25 DIAGNOSIS — R140 Abdominal distension (gaseous): Secondary | ICD-10-CM

## 2014-03-25 DIAGNOSIS — R51 Headache: Secondary | ICD-10-CM

## 2014-03-25 DIAGNOSIS — R142 Eructation: Secondary | ICD-10-CM

## 2014-03-25 MED ORDER — PROMETHAZINE HCL 12.5 MG RE SUPP: 12.5000 mg | Freq: Four times a day (QID) | RECTAL | Status: DC | PRN

## 2014-03-25 NOTE — Progress Notes (Signed)
Pre visit review using our clinic review tool, if applicable. No additional management support is needed unless otherwise documented below in the visit note. 

## 2014-03-25 NOTE — Assessment & Plan Note (Addendum)
Patient woke up with severe headache and nausea.  She has history of migraines.  Her headache has improved.  Her neurologic exam is non focal.  Use Phenergen supp 12.5 mg q 6 hrs as needed. Patient advised to call office if symptoms persist or worsen.

## 2014-03-25 NOTE — Telephone Encounter (Signed)
appt scheduled at 4:15 today

## 2014-03-25 NOTE — Assessment & Plan Note (Signed)
Nausea and vomiting of unclear etiology.  She describes abdominal bloating.  Her symptoms worse fatty foods.  Evaluate for gallbladder disease.  Check LFTs, CBCD, BMET and serum lipase.  She describes abnormal BM.  ??passing tapeworm.  Refer to GI for further evaluation.

## 2014-03-25 NOTE — Telephone Encounter (Signed)
Pt decline to see another provider. Pt is vomiting and having abd pain for a few weeks. Please advise. Pt decline to talk CAN. Pt would like to be work in today

## 2014-03-25 NOTE — Progress Notes (Signed)
Subjective:    Patient ID: Tammy [name], female    DOB: 1961/09/13, 53 y.o.   MRN: [id number]  HPI  53 year old white female with history of chronic pain syndrome, hypertension and possible chronic fatigue syndrome complains of nausea and vomiting. Patient previously seen for possible NSAID-induced gastritis. She has been using omeprazole 20 mg once daily. She reports her stomach is feeling better overall but she has unexplained abdominal bloating and intermittent nausea.  Her symptoms worse with intake of fatty foods.  She woke up this morning with acute symptoms and vomited twice. Patient also complains of severe headache. She has previous history of migraines 5 or 6 years ago where she experienced frequent migraines associated with nausea and vomiting. She reports her headache is improved but she has not been able to eat very much all day. Patient able to keep liquids down.  Headache is bilateral. Her symptoms worse with laying down. She denies any vertigo. She denies any visual changes.   Review of Systems Negative for fever chills, negative for preceding illness.  She reports issues with constipation. She took over-the-counter Dulcolax and then experience loose stools. Patient worried that she may have passe tapeworm.    Past Medical History  Diagnosis Date  . Neuropathy   . Hypertension   . Chronic fatigue syndrome     History   Social History  . Marital Status: Married    Spouse Name: N/A    Number of Children: N/A  . Years of Education: N/A   Occupational History  . Not on file.   Social History Main Topics  . Smoking status: Never Smoker   . Smokeless tobacco: Not on file  . Alcohol Use: No  . Drug Use: No  . Sexual Activity: Not on file   Other Topics Concern  . Not on file   Social History Narrative  . No narrative on file    Past Surgical History  Procedure Laterality Date  . Tonsillectomy  1965  . Cardiac catheterization  09/18/2012    Family  History  Problem Relation Age of Onset  . Heart attack Maternal Grandmother 50  . Arthritis Maternal Grandmother   . Hyperlipidemia Maternal Grandmother   . Heart disease Maternal Grandmother   . Diabetes Maternal Grandmother   . Heart attack Brother     No Known Allergies  Current Outpatient Prescriptions on File Prior to Visit  Medication Sig Dispense Refill  . albuterol (PROVENTIL HFA;VENTOLIN HFA) 108 (90 BASE) MCG/ACT inhaler Inhale 2 puffs into the lungs every 6 (six) hours as needed for shortness of breath.  1 Inhaler  2  . aspirin EC 81 MG EC tablet Take 1 tablet (81 mg total) by mouth daily.      . DULoxetine (CYMBALTA) 30 MG capsule Take 1 capsule (30 mg total) by mouth daily.  90 capsule  1  . DULoxetine (CYMBALTA) 60 MG capsule Take 1 capsule (60 mg total) by mouth daily.  90 capsule  1  . gabapentin (NEURONTIN) 100 MG capsule Take 2 capsules (200 mg total) by mouth 3 (three) times daily.  540 capsule  2  . hydrochlorothiazide (HYDRODIURIL) 25 MG tablet Take 1 tablet (25 mg total) by mouth daily.  90 tablet  1  . nitroGLYCERIN (NITROSTAT) 0.4 MG SL tablet Place 1 tablet (0.4 mg total) under the tongue every 5 (five) minutes as needed for chest pain.  45 tablet  0  . traMADol (ULTRAM) 50 MG tablet Take 0.5-1 tablets (25-50  mg total) by mouth every 12 (twelve) hours as needed (take for break through pain). For pain  60 tablet  5  . zolpidem (AMBIEN) 10 MG tablet Take 1 tablet (10 mg total) by mouth at bedtime as needed for sleep.  90 tablet  1   No current facility-administered medications on file prior to visit.    BP 122/72  Temp(Src) 97.9 F (36.6 C) (Oral)  Wt 147 lb (66.679 kg)  LMP 11/05/2007    Objective:   Physical Exam  Constitutional: She is oriented to person, place, and time. She appears well-developed and well-nourished.  Slightly ill appearing  HENT:  Head: Normocephalic and atraumatic.  Mouth/Throat: Oropharynx is clear and moist.  No nystagmus    Eyes: Conjunctivae and EOM are normal. Pupils are equal, round, and reactive to light.  Neck: Neck supple.  No nuchal rigidity  Cardiovascular: Normal rate, regular rhythm and normal heart sounds.   Pulmonary/Chest: Effort normal and breath sounds normal. She has no wheezes.  Abdominal: Soft. She exhibits no mass. There is no rebound and no guarding.  Mild epigastric and lower abdominal tenderness No right upper quadrant tenderness  Musculoskeletal: She exhibits no edema.  Lymphadenopathy:    She has no cervical adenopathy.  Neurological: She is alert and oriented to person, place, and time. No cranial nerve deficit.  Skin: Skin is warm and dry.       Assessment & Plan:

## 2014-03-26 LAB — CBC WITH DIFFERENTIAL/PLATELET
Basophils Absolute: 0 10*3/uL (ref 0.0–0.1)
Basophils Relative: 0.4 % (ref 0.0–3.0)
Eosinophils Absolute: 0 10*3/uL (ref 0.0–0.7)
Eosinophils Relative: 1.1 % (ref 0.0–5.0)
HCT: 38.9 % (ref 36.0–46.0)
Hemoglobin: 13.3 g/dL (ref 12.0–15.0)
Lymphocytes Relative: 22.8 % (ref 12.0–46.0)
Lymphs Abs: 0.9 10*3/uL (ref 0.7–4.0)
MCHC: 34.1 g/dL (ref 30.0–36.0)
MCV: 84 fl (ref 78.0–100.0)
Monocytes Absolute: 0.2 10*3/uL (ref 0.1–1.0)
Monocytes Relative: 6.3 % (ref 3.0–12.0)
Neutro Abs: 2.7 10*3/uL (ref 1.4–7.7)
Neutrophils Relative %: 69.4 % (ref 43.0–77.0)
Platelets: 208 10*3/uL (ref 150.0–400.0)
RBC: 4.63 Mil/uL (ref 3.87–5.11)
RDW: 12.9 % (ref 11.5–15.5)
WBC: 3.9 10*3/uL — ABNORMAL LOW (ref 4.0–10.5)

## 2014-03-26 LAB — BASIC METABOLIC PANEL
BUN: 10 mg/dL (ref 6–23)
CO2: 33 mEq/L — ABNORMAL HIGH (ref 19–32)
Calcium: 9.9 mg/dL (ref 8.4–10.5)
Chloride: 103 mEq/L (ref 96–112)
Creatinine, Ser: 0.7 mg/dL (ref 0.4–1.2)
GFR: 92.35 mL/min (ref >60.00)
Glucose, Bld: 96 mg/dL (ref 70–99)
Potassium: 4.1 mEq/L (ref 3.5–5.1)
Sodium: 141 mEq/L (ref 135–145)

## 2014-03-26 LAB — HEPATIC FUNCTION PANEL
ALT: 25 U/L (ref 0–35)
AST: 27 U/L (ref 0–37)
Albumin: 4.7 g/dL (ref 3.5–5.2)
Alkaline Phosphatase: 47 U/L (ref 39–117)
Bilirubin, Direct: 0 mg/dL (ref 0.0–0.3)
Total Bilirubin: 0.4 mg/dL (ref 0.2–1.2)
Total Protein: 7.2 g/dL (ref 6.0–8.3)

## 2014-03-26 LAB — LIPASE: Lipase: 25 U/L (ref 11.0–59.0)

## 2014-03-28 ENCOUNTER — Encounter: Payer: Self-pay | Admitting: Internal Medicine

## 2014-04-10 ENCOUNTER — Ambulatory Visit
Admission: RE | Admit: 2014-04-10 | Discharge: 2014-04-10 | Disposition: A | Discharge status: Home / Self Care | Payer: 59 | Source: Ambulatory Visit | Attending: Internal Medicine | Admitting: Internal Medicine

## 2014-04-10 ENCOUNTER — Telehealth: Payer: Self-pay | Admitting: Internal Medicine

## 2014-04-10 DIAGNOSIS — R112 Nausea with vomiting, unspecified: Secondary | ICD-10-CM

## 2014-04-10 DIAGNOSIS — R140 Abdominal distension (gaseous): Secondary | ICD-10-CM

## 2014-04-10 NOTE — Telephone Encounter (Signed)
Dr. Pyrtle will you accept 

## 2014-04-10 NOTE — Telephone Encounter (Signed)
Dr. Carlean Purl do you approve of change to Dr. Hilarie Fredrickson?

## 2014-04-10 NOTE — Telephone Encounter (Signed)
OK 

## 2014-04-10 NOTE — Telephone Encounter (Signed)
Okay with me 

## 2014-04-10 NOTE — Telephone Encounter (Signed)
Patient is requesting to switch care from Dr. Carlean Purl to Dr. Hilarie Fredrickson. She states its nothing against Dr. Carlean Purl she just hasn't seen him in a while, and Dr. Hilarie Fredrickson was recommended to her. Patient needs to be seen for abdominal pain. Had an US done today.

## 2014-04-12 NOTE — Telephone Encounter (Signed)
L/m for the patient informing her of the change. Also let her know I would call back once the October schedule comes up.

## 2014-04-18 ENCOUNTER — Encounter: Payer: Self-pay | Admitting: Internal Medicine

## 2014-06-13 ENCOUNTER — Other Ambulatory Visit: Payer: Self-pay

## 2014-06-13 ENCOUNTER — Telehealth: Payer: Self-pay | Admitting: Internal Medicine

## 2014-06-13 DIAGNOSIS — Z1231 Encounter for screening mammogram for malignant neoplasm of breast: Secondary | ICD-10-CM

## 2014-06-13 NOTE — Telephone Encounter (Signed)
Please advise 

## 2014-06-13 NOTE — Telephone Encounter (Signed)
Pt would like to have dr Maudie Mercury do her cpx due to dr Shawna Orleans availability. Pt does not want to switch PCP. Can I sch?

## 2014-06-13 NOTE — Telephone Encounter (Signed)
Sure. Thanks 

## 2014-06-19 ENCOUNTER — Ambulatory Visit: Payer: 59

## 2014-06-19 ENCOUNTER — Ambulatory Visit: Payer: 59 | Admitting: Gastroenterology

## 2014-06-20 NOTE — Telephone Encounter (Signed)
lmom for pt to cb

## 2014-06-21 ENCOUNTER — Ambulatory Visit: Payer: 59 | Admitting: Internal Medicine

## 2014-06-24 NOTE — Telephone Encounter (Signed)
lmom for pt to cb

## 2014-06-25 ENCOUNTER — Ambulatory Visit
Admission: RE | Admit: 2014-06-25 | Discharge: 2014-06-25 | Disposition: A | Discharge status: Home / Self Care | Payer: 59 | Source: Ambulatory Visit

## 2014-06-25 DIAGNOSIS — Z1231 Encounter for screening mammogram for malignant neoplasm of breast: Secondary | ICD-10-CM

## 2014-06-25 NOTE — Telephone Encounter (Signed)
Pt has been sch

## 2014-07-02 ENCOUNTER — Ambulatory Visit (INDEPENDENT_AMBULATORY_CARE_PROVIDER_SITE_OTHER): Payer: 59 | Admitting: Internal Medicine

## 2014-07-02 ENCOUNTER — Encounter: Payer: Self-pay | Admitting: Internal Medicine

## 2014-07-02 ENCOUNTER — Other Ambulatory Visit (INDEPENDENT_AMBULATORY_CARE_PROVIDER_SITE_OTHER): Payer: 59

## 2014-07-02 VITALS — BP 122/82 | HR 88 | Ht 62.25 in | Wt 150.0 lb

## 2014-07-02 DIAGNOSIS — K5900 Constipation, unspecified: Secondary | ICD-10-CM

## 2014-07-02 DIAGNOSIS — R198 Other specified symptoms and signs involving the digestive system and abdomen: Secondary | ICD-10-CM

## 2014-07-02 DIAGNOSIS — R1912 Hyperactive bowel sounds: Secondary | ICD-10-CM

## 2014-07-02 DIAGNOSIS — R140 Abdominal distension (gaseous): Secondary | ICD-10-CM

## 2014-07-02 DIAGNOSIS — K589 Irritable bowel syndrome without diarrhea: Secondary | ICD-10-CM

## 2014-07-02 DIAGNOSIS — R110 Nausea: Secondary | ICD-10-CM

## 2014-07-02 LAB — COMPREHENSIVE METABOLIC PANEL
ALT: 25 U/L (ref 0–35)
AST: 28 U/L (ref 0–37)
Albumin: 4.3 g/dL (ref 3.5–5.2)
Alkaline Phosphatase: 59 U/L (ref 39–117)
BUN: 12 mg/dL (ref 6–23)
CO2: 32 mEq/L (ref 19–32)
Calcium: 9.9 mg/dL (ref 8.4–10.5)
Chloride: 98 mEq/L (ref 96–112)
Creatinine, Ser: 0.7 mg/dL (ref 0.4–1.2)
GFR: 95.39 mL/min (ref >60.00)
Glucose, Bld: 97 mg/dL (ref 70–99)
Potassium: 3.3 mEq/L — ABNORMAL LOW (ref 3.5–5.1)
Sodium: 138 mEq/L (ref 135–145)
Total Bilirubin: 0.5 mg/dL (ref 0.2–1.2)
Total Protein: 8 g/dL (ref 6.0–8.3)

## 2014-07-02 LAB — TSH: TSH: 1.52 u[IU]/mL (ref 0.35–4.50)

## 2014-07-02 LAB — IGA: IgA: 100 mg/dL (ref 68–378)

## 2014-07-02 MED ORDER — LINACLOTIDE 145 MCG PO CAPS: 145.0000 ug | ORAL_CAPSULE | Freq: Every day | ORAL | Status: DC
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
## 2014-07-02 NOTE — Patient Instructions (Addendum)
Go to the basement for labs today Stop using OTC laxitives  Stop Magnesium  Follow up in 8 weeks  ( 09/03/2014 at 2:45pm)

## 2014-07-04 LAB — TISSUE TRANSGLUTAMINASE, IGA: Tissue Transglutaminase Ab, IgA: 5 U/mL (ref <20)

## 2014-07-05 ENCOUNTER — Telehealth: Payer: Self-pay | Admitting: Internal Medicine

## 2014-07-05 NOTE — Telephone Encounter (Signed)
Patient notified of results.

## 2014-08-21 ENCOUNTER — Encounter: Payer: Self-pay | Admitting: Internal Medicine

## 2014-08-21 ENCOUNTER — Other Ambulatory Visit: Payer: Self-pay | Admitting: Internal Medicine

## 2014-08-22 ENCOUNTER — Encounter: Payer: Self-pay | Admitting: Family Medicine

## 2014-08-22 ENCOUNTER — Ambulatory Visit (INDEPENDENT_AMBULATORY_CARE_PROVIDER_SITE_OTHER): Payer: 59 | Admitting: Family Medicine

## 2014-08-22 VITALS — BP 100/64 | HR 75 | Temp 97.4°F | Ht 62.0 in | Wt 156.1 lb

## 2014-08-22 DIAGNOSIS — E785 Hyperlipidemia, unspecified: Secondary | ICD-10-CM

## 2014-08-22 DIAGNOSIS — I10 Essential (primary) hypertension: Secondary | ICD-10-CM

## 2014-08-22 DIAGNOSIS — G8929 Other chronic pain: Secondary | ICD-10-CM

## 2014-08-22 DIAGNOSIS — Z23 Encounter for immunization: Secondary | ICD-10-CM

## 2014-08-22 DIAGNOSIS — R140 Abdominal distension (gaseous): Secondary | ICD-10-CM

## 2014-08-22 DIAGNOSIS — G4700 Insomnia, unspecified: Secondary | ICD-10-CM

## 2014-08-22 DIAGNOSIS — K5900 Constipation, unspecified: Secondary | ICD-10-CM

## 2014-08-22 DIAGNOSIS — Z0000 Encounter for general adult medical examination without abnormal findings: Secondary | ICD-10-CM

## 2014-08-22 LAB — LIPID PANEL
Cholesterol: 232 mg/dL — ABNORMAL HIGH (ref 0–200)
HDL: 62.1 mg/dL (ref >39.00)
LDL Cholesterol: 146 mg/dL — ABNORMAL HIGH (ref 0–99)
NonHDL: 169.9
Total CHOL/HDL Ratio: 4
Triglycerides: 118 mg/dL (ref 0.0–149.0)
VLDL: 23.6 mg/dL (ref 0.0–40.0)

## 2014-08-22 LAB — HEMOGLOBIN A1C: Hgb A1c MFr Bld: 5.8 % (ref 4.6–6.5)

## 2014-08-22 MED ORDER — ZOLPIDEM TARTRATE 10 MG PO TABS: 10.0000 mg | ORAL_TABLET | Freq: Every evening | ORAL | Status: DC | PRN

## 2014-08-22 MED ORDER — NITROGLYCERIN 0.4 MG SL SUBL: 0.4000 mg | SUBLINGUAL_TABLET | SUBLINGUAL | Status: DC | PRN

## 2014-08-22 MED ORDER — HYDROCHLOROTHIAZIDE 25 MG PO TABS: 25.0000 mg | ORAL_TABLET | Freq: Every day | ORAL | Status: DC

## 2014-08-22 MED ORDER — DULOXETINE HCL 30 MG PO CPEP: 30.0000 mg | ORAL_CAPSULE | Freq: Every day | ORAL | Status: DC

## 2014-08-22 MED ORDER — DULOXETINE HCL 60 MG PO CPEP: 60.0000 mg | ORAL_CAPSULE | Freq: Every day | ORAL | Status: DC

## 2014-08-22 MED ORDER — ALBUTEROL SULFATE HFA 108 (90 BASE) MCG/ACT IN AERS: 2.0000 | INHALATION_SPRAY | Freq: Four times a day (QID) | RESPIRATORY_TRACT | Status: DC | PRN

## 2014-08-22 MED ORDER — GABAPENTIN 100 MG PO CAPS: 200.0000 mg | ORAL_CAPSULE | Freq: Three times a day (TID) | ORAL | Status: DC

## 2014-08-22 MED ORDER — TRAMADOL HCL 50 MG PO TABS: 25.0000 mg | ORAL_TABLET | Freq: Two times a day (BID) | ORAL | Status: DC | PRN

## 2014-08-22 MED ORDER — LINACLOTIDE 145 MCG PO CAPS: 145.0000 ug | ORAL_CAPSULE | Freq: Every day | ORAL | Status: DC

## 2014-08-22 NOTE — Progress Notes (Signed)
HPI:  Here for CPE:  Tammy [name] is a pt of Dr. Shawna Orleans, whom I have never seen before. She wanted to do just her physical with me due to availability, but wants to continue her care for everything else with Dr. Shawna Orleans. She wants refills on all of her medications for 3 months and will follow up with her doctor for further refills.  -Concerns and/or follow up today: none  -Diet: variety of foods, balance and well rounded  -Exercise: no regular exercise   -Taking folic acid, vitamin D or calcium: no  -Diabetes and Dyslipidemia Screening: lipid panel and hemoglobinA1c - FASTING today  -Hx of HTN: no  -Vaccines: UTD  -pap history: 02/2012 reports was normal  -FDLMP: N/A  -sexual activity: yes, female partner, no new partners  -wants STI testing: no  -FH breast, colon or ovarian ca: see FH Last mammogram: 06/2014 Last colon cancer screening: 2011 and normal  Breast Ca Risk Assessment: -no personal or breast or ovarian ca in first degree relative  -Alcohol, Tobacco, drug use: see social history  Review of Systems - no fevers, unintentional weight loss, vision loss, hearing loss, chest pain, sob, hemoptysis, melena, hematochezia, hematuria, genital discharge, changing or concerning skin lesions, bleeding, bruising, loc, thoughts of self harm or SI  Past Medical History  Diagnosis Date  . Neuropathy   . Hypertension   . Chronic fatigue syndrome   . Arthritis     Past Surgical History  Procedure Laterality Date  . Tonsillectomy  1965  . Cardiac catheterization  09/18/2012    Family History  Problem Relation Age of Onset  . Heart attack Maternal Grandmother 50  . Arthritis Maternal Grandmother   . Hyperlipidemia Maternal Grandmother   . Heart disease Maternal Grandmother   . Diabetes Maternal Grandmother   . Heart attack Brother   . Colon cancer Neg Hx   . Colon polyps Father   . Esophageal cancer Neg Hx   . Kidney disease Neg Hx     History   Social History  .  Marital Status: Married    Spouse Name: N/A    Number of Children: 2  . 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 Smoking status: Never Smoker   . Smokeless tobacco: None  . Alcohol Use: No  . Drug Use: No  . Sexual Activity: None   Other Topics Concern  . None   Social History Narrative    Current outpatient prescriptions: albuterol (PROVENTIL HFA;VENTOLIN HFA) 108 (90 BASE) MCG/ACT inhaler, Inhale 2 puffs into the lungs every 6 (six) hours as needed for shortness of breath., Disp: 1 Inhaler, Rfl: 2;  DULoxetine (CYMBALTA) 30 MG capsule, Take 1 capsule (30 mg total) by mouth daily., Disp: 90 capsule, Rfl: 0;  DULoxetine (CYMBALTA) 60 MG capsule, Take 1 capsule (60 mg total) by mouth daily., Disp: 90 capsule, Rfl: 0 gabapentin (NEURONTIN) 100 MG capsule, Take 2 capsules (200 mg total) by mouth 3 (three) times daily., Disp: 180 capsule, Rfl: 0;  hydrochlorothiazide (HYDRODIURIL) 25 MG tablet, Take 1 tablet (25 mg total) by mouth daily., Disp: 90 tablet, Rfl: 0;  Linaclotide (LINZESS) 145 MCG CAPS capsule, Take 1 capsule (145 mcg total) by mouth daily., Disp: 90 capsule, Rfl: 0 nitroGLYCERIN (NITROSTAT) 0.4 MG SL tablet, Place 1 tablet (0.4 mg total) under the tongue every 5 (five) minutes as needed for chest pain., Disp: 45 tablet, Rfl: 0;  traMADol (ULTRAM) 50 MG tablet, Take 0.5-1 tablets (25-50 mg total) by mouth every 12 (twelve) hours as needed (take for break through pain). For pain, Disp: 180 tablet, Rfl: 0 zolpidem (AMBIEN) 10 MG tablet, Take 1 tablet (10 mg total) by mouth at bedtime as needed for sleep., Disp: 90 tablet, Rfl: 0  EXAM:  Filed Vitals:   08/22/14 0935  BP: 100/64  Pulse: 75  Temp: 97.4 F (36.3 C)    GENERAL: vitals reviewed and listed below, alert, oriented, appears well hydrated and in no acute distress  HEENT: head atraumatic, PERRLA, normal appearance of eyes, ears, nose and mouth. moist  mucus membranes.  NECK: supple, no masses or lymphadenopathy  LUNGS: clear to auscultation bilaterally, no rales, rhonchi or wheeze  CV: HRRR, no peripheral edema or cyanosis, normal pedal pulses  BREAST: normal appearance - no lesions or discharge, on palpation normal breast tissue without any suspicious masses  ABDOMEN: bowel sounds normal, soft, non tender to palpation, no masses, no rebound or guarding  GU: declined  SKIN: no rash or abnormal lesions  MS: normal gait, moves all extremities normally  NEURO: CN II-XII grossly intact, normal muscle strength and sensation to light touch on extremities  PSYCH: normal affect, pleasant and cooperative  ASSESSMENT AND PLAN:  Discussed the following assessment and plan:  There are no diagnoses linked to this encounter.  -Discussed and advised all [place] preventive services health task force level A and B recommendations for age, sex and risks.  -Advised at least 150 minutes of exercise per week and a healthy diet low in saturated fats and sweets and consisting of fresh fruits and vegetables, lean meats such as fish and white chicken and whole grains.  -FASTING labs, studies and vaccines per orders this encounter  Orders Placed This Encounter  Procedures  . Tdap vaccine greater than or equal to 7yo IM  . Lipid Panel  . Hemoglobin A1c    Patient advised to return to clinic immediately if symptoms worsen or persist or new concerns.  Patient Instructions  BEFORE YOU LEAVE: -labs -Tdap  -Vit D3 800-1000IU daily and 1200mg  calcium daily from diet or supplement if inadequate amount in diet  -pap is due next year  -We have ordered labs or studies at this visit. It can take up to 1-2 weeks for results and processing. We will contact you with instructions IF your results are abnormal. Normal results will be released to your [facility]. If you have not heard from [place] or can not find your results in [facility] in 2 weeks please contact our  office.  -PLEASE SIGN UP FOR MYCHART TODAY   We recommend the following healthy lifestyle measures: - eat a healthy diet consisting of lots of vegetables, fruits, beans, nuts, seeds, healthy meats such as white chicken and fish and whole grains.  - avoid fried foods, fast food, processed foods, sodas, red meet and other fattening foods.  - get a least 150 minutes of aerobic exercise per week.       Return in about 3 months (around 11/21/2014) for follow up with Dr. Augustina Mood, [place]

## 2014-08-22 NOTE — Progress Notes (Signed)
Pre visit review using our clinic review tool, if applicable. No additional management support is needed unless otherwise documented below in the visit note. 

## 2014-08-22 NOTE — Patient Instructions (Signed)
BEFORE YOU LEAVE: -labs -Tdap  -Vit D3 800-1000IU daily and 1200mg  calcium daily from diet or supplement if inadequate amount in diet  -pap is due next year  -We have ordered labs or studies at this visit. It can take up to 1-2 weeks for results and processing. We will contact you with instructions IF your results are abnormal. Normal results will be released to your [facility]. If you have not heard from [place] or can not find your results in [facility] in 2 weeks please contact our office.  -PLEASE SIGN UP FOR MYCHART TODAY   We recommend the following healthy lifestyle measures: - eat a healthy diet consisting of lots of vegetables, fruits, beans, nuts, seeds, healthy meats such as white chicken and fish and whole grains.  - avoid fried foods, fast food, processed foods, sodas, red meet and other fattening foods.  - get a least 150 minutes of aerobic exercise per week.

## 2014-08-23 ENCOUNTER — Telehealth: Payer: Self-pay | Admitting: Internal Medicine

## 2014-08-23 ENCOUNTER — Other Ambulatory Visit: Payer: Self-pay | Admitting: *Deleted

## 2014-08-23 MED ORDER — ZOLPIDEM TARTRATE 10 MG PO TABS: 10.0000 mg | ORAL_TABLET | Freq: Every evening | ORAL | Status: DC | PRN

## 2014-08-23 MED ORDER — TRAMADOL HCL 50 MG PO TABS: 25.0000 mg | ORAL_TABLET | Freq: Two times a day (BID) | ORAL | Status: DC | PRN

## 2014-08-23 NOTE — Telephone Encounter (Signed)
emmi emailed °

## 2014-08-29 ENCOUNTER — Encounter (HOSPITAL_COMMUNITY): Payer: Self-pay | Admitting: Cardiovascular Disease

## 2014-09-03 ENCOUNTER — Telehealth: Payer: Self-pay | Admitting: Internal Medicine

## 2014-09-03 ENCOUNTER — Ambulatory Visit: Payer: 59 | Admitting: Internal Medicine

## 2014-09-03 NOTE — Telephone Encounter (Signed)
Do not charge  

## 2014-10-28 ENCOUNTER — Encounter: Payer: Self-pay | Admitting: Internal Medicine

## 2014-10-28 ENCOUNTER — Ambulatory Visit (INDEPENDENT_AMBULATORY_CARE_PROVIDER_SITE_OTHER): Payer: 59 | Admitting: Internal Medicine

## 2014-10-28 VITALS — BP 132/70 | HR 101 | Ht 63.0 in | Wt 156.0 lb

## 2014-10-28 DIAGNOSIS — K5900 Constipation, unspecified: Secondary | ICD-10-CM

## 2014-10-28 DIAGNOSIS — R140 Abdominal distension (gaseous): Secondary | ICD-10-CM

## 2014-10-28 DIAGNOSIS — R110 Nausea: Secondary | ICD-10-CM

## 2014-10-28 DIAGNOSIS — K589 Irritable bowel syndrome without diarrhea: Secondary | ICD-10-CM

## 2014-10-28 MED ORDER — LINACLOTIDE 145 MCG PO CAPS: 145.0000 ug | ORAL_CAPSULE | Freq: Every day | ORAL | Status: DC

## 2014-10-28 NOTE — Progress Notes (Signed)
Subjective:    Patient ID: Tammy [name], female    DOB: 25-May-1961, 54 y.o.   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 She was initially seen in October 2015 at which time she was started on Linzess 145 g daily. She reports this significant early helped her chronic constipation along with abdominal bloating. She has continued 145 g but is using this in the evening. When taking it in the morning she noted increased borborygmi which was bothersome to her. On occasion she feels like the medication can lose efficacy and therefore she occasionally skips a day. She tried taking 2 tablets one day and had diarrhea and abdominal discomfort. Otherwise she is very pleased with the result of this medication. No blood in her stool or melena. She occasionally notes "unsettled stomach" along with nausea. She attributes this to running out of probiotic. She is using gabapentin in the evening, Cymbalta once daily, tramadol in the evening for joint pains and Ambien for sleep. She would like to try to decrease her overall medications if possible  Review of Systems  As per history of present illness, otherwise negative  Current Medications, Allergies, Past Medical History, Past Surgical History, Family History and Social History were reviewed in [facility] record.     Objective:   Physical Exam BP 132/70 mmHg  Pulse 101  Ht 5\' 3"  (1.6 m)  Wt 156 lb (70.761 kg)  BMI 27.64 kg/m2  SpO2 98%  LMP 11/05/2007 Constitutional: Well-developed and well-nourished. No distress. HEENT: Normocephalic and atraumatic.Marland Kitchen  No scleral icterus. Neck: Neck supple. Trachea midline. Neurological: Alert and oriented to person place and time. Psychiatric: Normal mood and affect. Behavior is normal.  CBC    Component Value Date/Time   WBC 3.9* 03/25/2014 1725   RBC 4.63 03/25/2014 1725   HGB 13.3 03/25/2014 1725   HCT 38.9 03/25/2014 1725   PLT 208.0 03/25/2014 1725   MCV 84.0 03/25/2014 1725   MCH 27.6 09/18/2012 0525   MCHC 34.1 03/25/2014 1725   RDW 12.9 03/25/2014 1725   LYMPHSABS 0.9 03/25/2014 1725   MONOABS 0.2 03/25/2014 1725   EOSABS 0.0 03/25/2014 1725   BASOSABS 0.0 03/25/2014 1725    Lab Results  Component Value Date   TSH 1.52 07/02/2014   Celiac panel negative  CMP     Component Value Date/Time   NA 138 07/02/2014 1646   K 3.3* 07/02/2014 1646   CL 98 07/02/2014 1646   CO2 32 07/02/2014 1646   GLUCOSE 97 07/02/2014 1646   BUN 12 07/02/2014 1646   CREATININE 0.7 07/02/2014 1646   CALCIUM 9.9 07/02/2014 1646   PROT 8.0 07/02/2014 1646   ALBUMIN 4.3 07/02/2014 1646   AST 28 07/02/2014 1646   ALT 25 07/02/2014 1646   ALKPHOS 59 07/02/2014 1646   BILITOT 0.5 07/02/2014 1646   GFRNONAA >90 09/18/2012 0525   GFRAA >90 09/18/2012 0525       Assessment & Plan:  54 yo female with PMH of chronic constipation, hypertension, 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  1. Chronic constipation/IBS -- improvement with Linzess 145 g daily. We'll continue this medication chronically. I have told her should the drug lose efficacy we could consider changing to another medication such as lubiprostone. She is asked to let us know. Some of her symptoms such as nausea and borborygmi may  or may not relate to IBS. I encouraged her to resume probiotic and she will try align once daily. If nausea becomes more of a daily issue I asked that she let me know. If this is the case would consider upper endoscopy. She is happy with this plan  2. CRC screening -- normal colon in 2011, repeat recommended 2021  9 month followup, sooner PRN

## 2014-10-28 NOTE — Patient Instructions (Addendum)
We have sent the following medications to your pharmacy for you to pick up at your convenience: Linzess 145 mcg daily  Please purchase the following medications over the counter and take as directed: Align once daily  Please follow up with Dr Hilarie Fredrickson in 9 months.  If your nausea, bloating and IBS symptoms do not improve, please contact us at [phone].  CC:Dr Shawna Orleans

## 2014-11-13 ENCOUNTER — Encounter: Payer: Self-pay | Admitting: Internal Medicine

## 2014-11-13 MED ORDER — ZOLPIDEM TARTRATE 10 MG PO TABS: 10.0000 mg | ORAL_TABLET | Freq: Every evening | ORAL | Status: DC | PRN

## 2014-11-13 MED ORDER — DULOXETINE HCL 30 MG PO CPEP: 30.0000 mg | ORAL_CAPSULE | Freq: Every day | ORAL | Status: DC

## 2014-11-13 MED ORDER — HYDROCHLOROTHIAZIDE 25 MG PO TABS: 25.0000 mg | ORAL_TABLET | Freq: Every day | ORAL | Status: DC

## 2014-11-13 MED ORDER — TRAMADOL HCL 50 MG PO TABS: 25.0000 mg | ORAL_TABLET | Freq: Two times a day (BID) | ORAL | Status: DC | PRN

## 2014-11-13 MED ORDER — DULOXETINE HCL 60 MG PO CPEP: 60.0000 mg | ORAL_CAPSULE | Freq: Every day | ORAL | Status: DC

## 2014-11-13 MED ORDER — GABAPENTIN 100 MG PO CAPS: 200.0000 mg | ORAL_CAPSULE | Freq: Three times a day (TID) | ORAL | Status: DC

## 2014-11-25 ENCOUNTER — Ambulatory Visit: Payer: 59 | Admitting: Internal Medicine

## 2014-12-10 ENCOUNTER — Other Ambulatory Visit: Payer: Self-pay | Admitting: Obstetrics and Gynecology

## 2014-12-10 LAB — HM DEXA SCAN

## 2014-12-12 LAB — CYTOLOGY - PAP

## 2015-03-31 ENCOUNTER — Encounter: Payer: Self-pay | Admitting: Internal Medicine

## 2015-04-08 ENCOUNTER — Telehealth: Payer: Self-pay | Admitting: Internal Medicine

## 2015-04-08 MED ORDER — ZOLPIDEM TARTRATE 10 MG PO TABS: 10.0000 mg | ORAL_TABLET | Freq: Every evening | ORAL | Status: DC | PRN

## 2015-04-08 NOTE — Telephone Encounter (Signed)
Okay per Dr Shawna Orleans and Rx called in.

## 2015-04-08 NOTE — Telephone Encounter (Signed)
Patient states she is leaving town and would like to have her zolpidem (AMBIEN) 10 MG tablet refilled 5 days early.  Can you please call Tammy [name] with Dr. Lora Havens response?

## 2015-04-08 NOTE — Telephone Encounter (Signed)
Waiting on Dr Lora Havens response.

## 2015-05-16 ENCOUNTER — Telehealth: Payer: Self-pay | Admitting: Internal Medicine

## 2015-05-16 NOTE — Telephone Encounter (Signed)
Error/njr °

## 2015-06-17 ENCOUNTER — Other Ambulatory Visit: Payer: Self-pay | Admitting: Internal Medicine

## 2015-06-23 ENCOUNTER — Encounter: Payer: Self-pay | Admitting: Internal Medicine

## 2015-06-23 ENCOUNTER — Other Ambulatory Visit: Payer: Self-pay | Admitting: Internal Medicine

## 2015-06-23 DIAGNOSIS — K5900 Constipation, unspecified: Secondary | ICD-10-CM

## 2015-06-23 DIAGNOSIS — R140 Abdominal distension (gaseous): Secondary | ICD-10-CM

## 2015-06-24 MED ORDER — ZOLPIDEM TARTRATE 10 MG PO TABS: 10.0000 mg | ORAL_TABLET | Freq: Every evening | ORAL | Status: DC | PRN

## 2015-06-24 MED ORDER — HYDROCHLOROTHIAZIDE 25 MG PO TABS: 25.0000 mg | ORAL_TABLET | Freq: Every day | ORAL | Status: DC

## 2015-06-24 MED ORDER — TRAMADOL HCL 50 MG PO TABS: 25.0000 mg | ORAL_TABLET | Freq: Two times a day (BID) | ORAL | Status: DC | PRN

## 2015-07-29 ENCOUNTER — Telehealth: Payer: Self-pay | Admitting: Internal Medicine

## 2015-07-29 NOTE — Telephone Encounter (Signed)
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 Pt would like to know if you could work her in on a tues, nov 29 or Dec 5? Pt  Is off on tuesdays.  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

## 2015-07-29 NOTE — Telephone Encounter (Signed)
That is fine 

## 2015-07-30 NOTE — Telephone Encounter (Signed)
Left message to call back and schedule appt

## 2015-07-31 NOTE — Telephone Encounter (Signed)
Pt scheduled  

## 2015-08-10 ENCOUNTER — Encounter: Payer: Self-pay | Admitting: Adult Health

## 2015-08-11 ENCOUNTER — Other Ambulatory Visit: Payer: Self-pay | Admitting: Adult Health

## 2015-08-11 DIAGNOSIS — E559 Vitamin D deficiency, unspecified: Secondary | ICD-10-CM

## 2015-08-12 ENCOUNTER — Other Ambulatory Visit (INDEPENDENT_AMBULATORY_CARE_PROVIDER_SITE_OTHER): Payer: 59

## 2015-08-12 DIAGNOSIS — E559 Vitamin D deficiency, unspecified: Secondary | ICD-10-CM

## 2015-08-12 DIAGNOSIS — Z0000 Encounter for general adult medical examination without abnormal findings: Secondary | ICD-10-CM | POA: Diagnosis not present

## 2015-08-12 LAB — CBC WITH DIFFERENTIAL/PLATELET
Basophils Absolute: 0 10*3/uL (ref 0.0–0.1)
Basophils Relative: 0.5 % (ref 0.0–3.0)
Eosinophils Absolute: 0.1 10*3/uL (ref 0.0–0.7)
Eosinophils Relative: 3.8 % (ref 0.0–5.0)
HCT: 40.7 % (ref 36.0–46.0)
Hemoglobin: 13.4 g/dL (ref 12.0–15.0)
Lymphocytes Relative: 30.6 % (ref 12.0–46.0)
Lymphs Abs: 1 10*3/uL (ref 0.7–4.0)
MCHC: 32.9 g/dL (ref 30.0–36.0)
MCV: 85.2 fl (ref 78.0–100.0)
Monocytes Absolute: 0.3 10*3/uL (ref 0.1–1.0)
Monocytes Relative: 9 % (ref 3.0–12.0)
Neutro Abs: 1.8 10*3/uL (ref 1.4–7.7)
Neutrophils Relative %: 56.1 % (ref 43.0–77.0)
Platelets: 181 10*3/uL (ref 150.0–400.0)
RBC: 4.78 Mil/uL (ref 3.87–5.11)
RDW: 13.4 % (ref 11.5–15.5)
WBC: 3.1 10*3/uL — ABNORMAL LOW (ref 4.0–10.5)

## 2015-08-12 LAB — BASIC METABOLIC PANEL
BUN: 17 mg/dL (ref 6–23)
CO2: 31 mEq/L (ref 19–32)
Calcium: 9.6 mg/dL (ref 8.4–10.5)
Chloride: 101 mEq/L (ref 96–112)
Creatinine, Ser: 0.74 mg/dL (ref 0.40–1.20)
GFR: 86.18 mL/min (ref >60.00)
Glucose, Bld: 80 mg/dL (ref 70–99)
Potassium: 3.9 mEq/L (ref 3.5–5.1)
Sodium: 140 mEq/L (ref 135–145)

## 2015-08-12 LAB — POCT URINALYSIS DIPSTICK
Bilirubin, UA: NEGATIVE
Blood, UA: NEGATIVE
Glucose, UA: NEGATIVE
Ketones, UA: NEGATIVE
Leukocytes, UA: NEGATIVE
Nitrite, UA: NEGATIVE
Spec Grav, UA: 1.015
Urobilinogen, UA: 0.2
pH, UA: 7.5

## 2015-08-12 LAB — HEPATIC FUNCTION PANEL
ALT: 35 U/L (ref 0–35)
AST: 26 U/L (ref 0–37)
Albumin: 4.4 g/dL (ref 3.5–5.2)
Alkaline Phosphatase: 42 U/L (ref 39–117)
Bilirubin, Direct: 0.1 mg/dL (ref 0.0–0.3)
Total Bilirubin: 0.5 mg/dL (ref 0.2–1.2)
Total Protein: 7 g/dL (ref 6.0–8.3)

## 2015-08-12 LAB — LIPID PANEL
Cholesterol: 213 mg/dL — ABNORMAL HIGH (ref 0–200)
HDL: 74.1 mg/dL (ref >39.00)
LDL Cholesterol: 124 mg/dL — ABNORMAL HIGH (ref 0–99)
NonHDL: 138.88
Total CHOL/HDL Ratio: 3
Triglycerides: 76 mg/dL (ref 0.0–149.0)
VLDL: 15.2 mg/dL (ref 0.0–40.0)

## 2015-08-12 LAB — VITAMIN D 25 HYDROXY (VIT D DEFICIENCY, FRACTURES): VITD: 34.03 ng/mL (ref 30.00–100.00)

## 2015-08-12 LAB — TSH: TSH: 1.74 u[IU]/mL (ref 0.35–4.50)

## 2015-08-19 ENCOUNTER — Ambulatory Visit (INDEPENDENT_AMBULATORY_CARE_PROVIDER_SITE_OTHER): Payer: 59 | Admitting: Adult Health

## 2015-08-19 ENCOUNTER — Encounter: Payer: Self-pay | Admitting: Adult Health

## 2015-08-19 VITALS — BP 100/72 | Temp 98.3°F | Ht 62.5 in | Wt 136.7 lb

## 2015-08-19 DIAGNOSIS — R809 Proteinuria, unspecified: Secondary | ICD-10-CM | POA: Diagnosis not present

## 2015-08-19 DIAGNOSIS — Z760 Encounter for issue of repeat prescription: Secondary | ICD-10-CM | POA: Diagnosis not present

## 2015-08-19 DIAGNOSIS — Z0000 Encounter for general adult medical examination without abnormal findings: Secondary | ICD-10-CM | POA: Diagnosis not present

## 2015-08-19 DIAGNOSIS — Z1239 Encounter for other screening for malignant neoplasm of breast: Secondary | ICD-10-CM | POA: Diagnosis not present

## 2015-08-19 LAB — POCT URINALYSIS DIPSTICK
Bilirubin, UA: NEGATIVE
Blood, UA: NEGATIVE
Glucose, UA: NEGATIVE
Ketones, UA: NEGATIVE
Leukocytes, UA: NEGATIVE
Nitrite, UA: NEGATIVE
Protein, UA: NEGATIVE
Spec Grav, UA: 1.015
Urobilinogen, UA: 0.2
pH, UA: 8

## 2015-08-19 MED ORDER — GABAPENTIN 100 MG PO CAPS: 200.0000 mg | ORAL_CAPSULE | Freq: Three times a day (TID) | ORAL | Status: DC

## 2015-08-19 MED ORDER — DULOXETINE HCL 30 MG PO CPEP: 30.0000 mg | ORAL_CAPSULE | Freq: Two times a day (BID) | ORAL | Status: DC

## 2015-08-19 MED ORDER — ALBUTEROL SULFATE HFA 108 (90 BASE) MCG/ACT IN AERS: 2.0000 | INHALATION_SPRAY | Freq: Four times a day (QID) | RESPIRATORY_TRACT | Status: DC | PRN

## 2015-08-19 MED ORDER — LINACLOTIDE 145 MCG PO CAPS: 145.0000 ug | ORAL_CAPSULE | Freq: Every day | ORAL | Status: DC

## 2015-08-19 MED ORDER — HYDROCHLOROTHIAZIDE 25 MG PO TABS: 25.0000 mg | ORAL_TABLET | Freq: Every day | ORAL | Status: DC

## 2015-08-19 NOTE — Patient Instructions (Addendum)
It was great meeting you today!  Continue to do what you are doing!   Diet and exercise to lower the cholesterol.   Follow up in one year or sooner if needed.    Health Maintenance, Female Adopting a healthy lifestyle and getting preventive care can go a long way to promote health and wellness. Talk with your health care provider about what schedule of regular examinations is right for you. This is a good chance for you to check in with your provider about disease prevention and staying healthy. In between checkups, there are plenty of things you can do on your own. Experts have done a lot of research about which lifestyle changes and preventive measures are most likely to keep you healthy. Ask your health care provider for more information. WEIGHT AND DIET  Eat a healthy diet  Be sure to include plenty of vegetables, fruits, low-fat dairy products, and lean protein.  Do not eat a lot of foods high in solid fats, added sugars, or salt.  Get regular exercise. This is one of the most important things you can do for your health.  Most adults should exercise for at least 150 minutes each week. The exercise should increase your heart rate and make you sweat (moderate-intensity exercise).  Most adults should also do strengthening exercises at least twice a week. This is in addition to the moderate-intensity exercise.  Maintain a healthy weight  Body mass index (BMI) is a measurement that can be used to identify possible weight problems. It estimates body fat based on height and weight. Your health care provider can help determine your BMI and help you achieve or maintain a healthy weight.  For females 73 years of age and older:   A BMI below 18.5 is considered underweight.  A BMI of 18.5 to 24.9 is normal.  A BMI of 25 to 29.9 is considered overweight.  A BMI of 30 and above is considered obese.  Watch levels of cholesterol and blood lipids  You should start having your blood  tested for lipids and cholesterol at 56 years of age, then have this test every 5 years.  You may need to have your cholesterol levels checked more often if:  Your lipid or cholesterol levels are high.  You are older than 56 years of age.  You are at high risk for heart disease.  CANCER SCREENING   Lung Cancer  Lung cancer screening is recommended for adults 66-33 years old who are at high risk for lung cancer because of a history of smoking.  A yearly low-dose CT scan of the lungs is recommended for people who:  Currently smoke.  Have quit within the past 15 years.  Have at least a 30-pack-year history of smoking. A pack year is smoking an average of one pack of cigarettes a day for 1 year.  Yearly screening should continue until it has been 15 years since you quit.  Yearly screening should stop if you develop a health problem that would prevent you from having lung cancer treatment.  Breast Cancer  Practice breast self-awareness. This means understanding how your breasts normally appear and feel.  It also means doing regular breast self-exams. Let your health care provider know about any changes, no matter how small.  If you are in your 20s or 30s, you should have a clinical breast exam (CBE) by a health care provider every 1-3 years as part of a regular health exam.  If you are 40 or  older, have a CBE every year. Also consider having a breast X-ray (mammogram) every year.  If you have a family history of breast cancer, talk to your health care provider about genetic screening.  If you are at high risk for breast cancer, talk to your health care provider about having an MRI and a mammogram every year.  Breast cancer gene (BRCA) assessment is recommended for women who have family members with BRCA-related cancers. BRCA-related cancers include:  Breast.  Ovarian.  Tubal.  Peritoneal cancers.  Results of the assessment will determine the need for genetic counseling  and BRCA1 and BRCA2 testing. Cervical Cancer Your health care provider may recommend that you be screened regularly for cancer of the pelvic organs (ovaries, uterus, and vagina). This screening involves a pelvic examination, including checking for microscopic changes to the surface of your cervix (Pap test). You may be encouraged to have this screening done every 3 years, beginning at age 8.  For women ages 38-65, health care providers may recommend pelvic exams and Pap testing every 3 years, or they may recommend the Pap and pelvic exam, combined with testing for human papilloma virus (HPV), every 5 years. Some types of HPV increase your risk of cervical cancer. Testing for HPV may also be done on women of any age with unclear Pap test results.  Other health care providers may not recommend any screening for nonpregnant women who are considered low risk for pelvic cancer and who do not have symptoms. Ask your health care provider if a screening pelvic exam is right for you.  If you have had past treatment for cervical cancer or a condition that could lead to cancer, you need Pap tests and screening for cancer for at least 20 years after your treatment. If Pap tests have been discontinued, your risk factors (such as having a new sexual partner) need to be reassessed to determine if screening should resume. Some women have medical problems that increase the chance of getting cervical cancer. In these cases, your health care provider may recommend more frequent screening and Pap tests. Colorectal Cancer  This type of cancer can be detected and often prevented.  Routine colorectal cancer screening usually begins at 56 years of age and continues through 55 years of age.  Your health care provider may recommend screening at an earlier age if you have risk factors for colon cancer.  Your health care provider may also recommend using home test kits to check for hidden blood in the stool.  A small camera  at the end of a tube can be used to examine your colon directly (sigmoidoscopy or colonoscopy). This is done to check for the earliest forms of colorectal cancer.  Routine screening usually begins at age 24.  Direct examination of the colon should be repeated every 5-10 years through 56 years of age. However, you may need to be screened more often if early forms of precancerous polyps or small growths are found. Skin Cancer  Check your skin from head to toe regularly.  Tell your health care provider about any new moles or changes in moles, especially if there is a change in a mole's shape or color.  Also tell your health care provider if you have a mole that is larger than the size of a pencil eraser.  Always use sunscreen. Apply sunscreen liberally and repeatedly throughout the day.  Protect yourself by wearing long sleeves, pants, a wide-brimmed hat, and sunglasses whenever you are outside. HEART DISEASE, DIABETES,  AND HIGH BLOOD PRESSURE   High blood pressure causes heart disease and increases the risk of stroke. High blood pressure is more likely to develop in:  People who have blood pressure in the high end of the normal range (130-139/85-89 mm Hg).  People who are overweight or obese.  People who are African American.  If you are 13-46 years of age, have your blood pressure checked every 3-5 years. If you are 61 years of age or older, have your blood pressure checked every year. You should have your blood pressure measured twice--once when you are at a hospital or clinic, and once when you are not at a hospital or clinic. Record the average of the two measurements. To check your blood pressure when you are not at a hospital or clinic, you can use:  An automated blood pressure machine at a pharmacy.  A home blood pressure monitor.  If you are between 79 years and 66 years old, ask your health care provider if you should take aspirin to prevent strokes.  Have regular diabetes  screenings. This involves taking a blood sample to check your fasting blood sugar level.  If you are at a normal weight and have a low risk for diabetes, have this test once every three years after 56 years of age.  If you are overweight and have a high risk for diabetes, consider being tested at a younger age or more often. PREVENTING INFECTION  Hepatitis B  If you have a higher risk for hepatitis B, you should be screened for this virus. You are considered at high risk for hepatitis B if:  You were born in a country where hepatitis B is common. Ask your health care provider which countries are considered high risk.  Your parents were born in a high-risk country, and you have not been immunized against hepatitis B (hepatitis B vaccine).  You have HIV or AIDS.  You use needles to inject street drugs.  You live with someone who has hepatitis B.  You have had sex with someone who has hepatitis B.  You get hemodialysis treatment.  You take certain medicines for conditions, including cancer, organ transplantation, and autoimmune conditions. Hepatitis C  Blood testing is recommended for:  Everyone born from [birth year] through 1967.  Anyone with known risk factors for hepatitis C. Sexually transmitted infections (STIs)  You should be screened for sexually transmitted infections (STIs) including gonorrhea and chlamydia if:  You are sexually active and are younger than 56 years of age.  You are older than 56 years of age and your health care provider tells you that you are at risk for this type of infection.  Your sexual activity has changed since you were last screened and you are at an increased risk for chlamydia or gonorrhea. Ask your health care provider if you are at risk.  If you do not have HIV, but are at risk, it may be recommended that you take a prescription medicine daily to prevent HIV infection. This is called pre-exposure prophylaxis (PrEP). You are considered at risk  if:  You are sexually active and do not regularly use condoms or know the HIV status of your partner(s).  You take drugs by injection.  You are sexually active with a partner who has HIV. Talk with your health care provider about whether you are at high risk of being infected with HIV. If you choose to begin PrEP, you should first be tested for HIV. You should then be  tested every 3 months for as long as you are taking PrEP.  PREGNANCY   If you are premenopausal and you may become pregnant, ask your health care provider about preconception counseling.  If you may become pregnant, take 400 to 800 micrograms (mcg) of folic acid every day.  If you want to prevent pregnancy, talk to your health care provider about birth control (contraception). OSTEOPOROSIS AND MENOPAUSE   Osteoporosis is a disease in which the bones lose minerals and strength with aging. This can result in serious bone fractures. Your risk for osteoporosis can be identified using a bone density scan.  If you are 48 years of age or older, or if you are at risk for osteoporosis and fractures, ask your health care provider if you should be screened.  Ask your health care provider whether you should take a calcium or vitamin D supplement to lower your risk for osteoporosis.  Menopause may have certain physical symptoms and risks.  Hormone replacement therapy may reduce some of these symptoms and risks. Talk to your health care provider about whether hormone replacement therapy is right for you.  HOME CARE INSTRUCTIONS   Schedule regular health, dental, and eye exams.  Stay current with your immunizations.   Do not use any tobacco products including cigarettes, chewing tobacco, or electronic cigarettes.  If you are pregnant, do not drink alcohol.  If you are breastfeeding, limit how much and how often you drink alcohol.  Limit alcohol intake to no more than 1 drink per day for nonpregnant women. One drink equals 12  ounces of beer, 5 ounces of wine, or 1 ounces of hard liquor.  Do not use street drugs.  Do not share needles.  Ask your health care provider for help if you need support or information about quitting drugs.  Tell your health care provider if you often feel depressed.  Tell your health care provider if you have ever been abused or do not feel safe at home.   This information is not intended to replace advice given to you by your health care provider. Make sure you discuss any questions you have with your health care provider.   Document Released: 03/22/2011 Document Revised: 09/27/2014 Document Reviewed: 08/08/2013 Elsevier Interactive Patient Education [employer].

## 2015-08-19 NOTE — Progress Notes (Signed)
Subjective:    Patient ID: Tammy [name], female    DOB: 12/19/1960, 54 y.o.   MRN: [id number]  HPI  Patient presents for yearly preventative medicine examination. She is a pleasant caucasian female who  has a past medical history of Neuropathy ([place]); Hypertension; Chronic fatigue syndrome; and Arthritis.  All immunizations and health maintenance protocols were reviewed with the patient and needed orders were placed.  Appropriate screening laboratory values were reviewed with patient including screening of hyperlipidemia, renal function and hepatic function.  Medication reconciliation,  past medical history, social history, problem list and allergies were reviewed in detail with the patient  Goals were established with regard to weight loss, exercise, and  diet in compliance with medications  She has recently had her GYN exam done, she does monthly breast exams and has yearly mammograms.   She has no acute issues she would like discussed at this time.   Review of Systems  Constitutional: Negative.   HENT: Negative.   Eyes: Negative.   Respiratory: Negative.   Cardiovascular: Negative.   Gastrointestinal: Negative.   Endocrine: Negative.   Genitourinary: Negative.   Musculoskeletal: Negative.   Skin: Negative.   Allergic/Immunologic: Negative.   Neurological: Negative.   Hematological: Negative.   Psychiatric/Behavioral: Negative.   All other systems reviewed and are negative.      Objective:   Physical Exam  Constitutional: She is oriented to person, place, and time. She appears well-developed and well-nourished. No distress.  HENT:  Head: Normocephalic and atraumatic.  Right Ear: External ear normal.  Left Ear: External ear normal.  Nose: Nose normal.  Mouth/Throat: Oropharynx is clear and moist. No oropharyngeal exudate.  Eyes: Conjunctivae and EOM are normal. Pupils are equal, round, and reactive to light. Right eye exhibits no discharge. Left eye exhibits no  discharge.  Neck: Normal range of motion. Neck supple. No tracheal deviation present. No thyromegaly present.  Cardiovascular: Normal rate, regular rhythm, normal heart sounds and intact distal pulses.  Exam reveals no gallop and no friction rub.   No murmur heard. Pulmonary/Chest: Effort normal and breath sounds normal. No respiratory distress. She has no wheezes. She has no rales. She exhibits no tenderness.  Abdominal: Soft. Bowel sounds are normal. She exhibits no distension and no mass. There is no tenderness. There is no rebound and no guarding.  Musculoskeletal: Normal range of motion.  Lymphadenopathy:    She has no cervical adenopathy.  Neurological: She is alert and oriented to person, place, and time. No cranial nerve deficit. Coordination normal.  Skin: Skin is warm and dry. No rash noted. No erythema. No pallor.  Psychiatric: She has a normal mood and affect. Her behavior is normal. Judgment and thought content normal.  Nursing note and vitals reviewed.     Assessment & Plan:  1. Routine general medical examination at a health care facility - Reviewed labs with patient.  - She has an understanding that she needs to work on diet and exercise due to her slightly elevated cholesterol.  - POC Urinalysis Dipstick - Follow up in one year for CPE - Follow up sooner if needed  2. Breast cancer screening  - MM SCREENING BREAST TOMO BILATERAL; Future  3. Medication refill - gabapentin (NEURONTIN) 100 MG capsule; Take 2 capsules (200 mg total) by mouth 3 (three) times daily.  Dispense: 540 capsule; Refill: 1 - Linaclotide (LINZESS) 145 MCG CAPS capsule; Take 1 capsule (145 mcg total) by mouth daily.  Dispense: 90 capsule; Refill: 1 -  DULoxetine (CYMBALTA) 30 MG capsule; Take 1 capsule (30 mg total) by mouth 2 (two) times daily.  Dispense: 180 capsule; Refill: 1 - albuterol (PROVENTIL HFA;VENTOLIN HFA) 108 (90 BASE) MCG/ACT inhaler; Inhale 2 puffs into the lungs every 6 (six) hours as  needed for shortness of breath.  Dispense: 18 g; Refill: 3 - hydrochlorothiazide (HYDRODIURIL) 25 MG tablet; Take 1 tablet (25 mg total) by mouth daily.  Dispense: 90 tablet; Refill: 1  4. Proteinuria - POC Urinalysis Dipstick - resolved

## 2015-08-19 NOTE — Progress Notes (Signed)
Pre visit review using our clinic review tool, if applicable. No additional management support is needed unless otherwise documented below in the visit note. 

## 2015-08-21 ENCOUNTER — Telehealth: Payer: Self-pay | Admitting: *Deleted

## 2015-08-21 NOTE — Telephone Encounter (Signed)
Ok to RF x 2 

## 2015-08-21 NOTE — Telephone Encounter (Signed)
Patient is requesting a refill of tramadol and Ambien. Okay to fill?

## 2015-08-22 ENCOUNTER — Other Ambulatory Visit: Payer: 59

## 2015-08-22 MED ORDER — TRAMADOL HCL 50 MG PO TABS: 25.0000 mg | ORAL_TABLET | Freq: Two times a day (BID) | ORAL | Status: DC | PRN

## 2015-08-22 MED ORDER — ZOLPIDEM TARTRATE 10 MG PO TABS: 10.0000 mg | ORAL_TABLET | Freq: Every evening | ORAL | Status: DC | PRN

## 2015-08-25 ENCOUNTER — Ambulatory Visit (HOSPITAL_BASED_OUTPATIENT_CLINIC_OR_DEPARTMENT_OTHER)
Admission: RE | Admit: 2015-08-25 | Discharge: 2015-08-25 | Disposition: A | Discharge status: Home / Self Care | Payer: 59 | Source: Ambulatory Visit | Attending: Adult Health | Admitting: Adult Health

## 2015-08-25 DIAGNOSIS — Z1239 Encounter for other screening for malignant neoplasm of breast: Secondary | ICD-10-CM | POA: Diagnosis not present

## 2015-08-25 DIAGNOSIS — Z1231 Encounter for screening mammogram for malignant neoplasm of breast: Secondary | ICD-10-CM | POA: Diagnosis present

## 2015-08-28 ENCOUNTER — Encounter: Payer: 59 | Admitting: Family Medicine

## 2015-08-29 ENCOUNTER — Encounter: Payer: 59 | Admitting: Internal Medicine

## 2015-10-01 DIAGNOSIS — M47812 Spondylosis without myelopathy or radiculopathy, cervical region: Secondary | ICD-10-CM | POA: Diagnosis not present

## 2015-10-01 DIAGNOSIS — M47816 Spondylosis without myelopathy or radiculopathy, lumbar region: Secondary | ICD-10-CM | POA: Diagnosis not present

## 2015-10-01 MED FILL — traMADol HCL 50 MG TABS: 50 | 90 days supply | Qty: 180 | Fill #0

## 2015-10-01 MED FILL — GABAPENTIN 100 MG CAPSULE: 100 | 90 days supply | Qty: 540 | Fill #0

## 2015-10-01 MED FILL — HYDROCHLOROTHIAZIDE 25 MG T: 25 | 90 days supply | Qty: 90 | Fill #0

## 2015-10-01 MED FILL — DULoxetine HCL 30 MG CPEP: 30 | 90 days supply | Qty: 180 | Fill #0

## 2015-10-03 MED FILL — ZOLPIDEM TARTRATE 10 MG TAB: 10 | 90 days supply | Qty: 90 | Fill #0

## 2015-10-13 MED FILL — VYVANSE 20 MG CAPSULE: 20 | 30 days supply | Qty: 30 | Fill #0

## 2015-11-06 ENCOUNTER — Encounter: Payer: Self-pay | Admitting: Internal Medicine

## 2015-11-25 DIAGNOSIS — M4806 Spinal stenosis, lumbar region: Secondary | ICD-10-CM | POA: Diagnosis not present

## 2015-11-25 DIAGNOSIS — G894 Chronic pain syndrome: Secondary | ICD-10-CM | POA: Diagnosis not present

## 2015-11-25 DIAGNOSIS — H524 Presbyopia: Secondary | ICD-10-CM | POA: Diagnosis not present

## 2015-11-25 DIAGNOSIS — I10 Essential (primary) hypertension: Secondary | ICD-10-CM | POA: Diagnosis not present

## 2015-11-25 DIAGNOSIS — M797 Fibromyalgia: Secondary | ICD-10-CM | POA: Diagnosis not present

## 2015-11-25 DIAGNOSIS — E7800 Pure hypercholesterolemia, unspecified: Secondary | ICD-10-CM | POA: Diagnosis not present

## 2015-11-25 DIAGNOSIS — M4802 Spinal stenosis, cervical region: Secondary | ICD-10-CM | POA: Diagnosis not present

## 2015-12-09 MED FILL — VYVANSE 20 MG CAPSULE: 20 | 30 days supply | Qty: 30 | Fill #0

## 2015-12-16 DIAGNOSIS — R41840 Attention and concentration deficit: Secondary | ICD-10-CM | POA: Diagnosis not present

## 2015-12-16 DIAGNOSIS — F5104 Psychophysiologic insomnia: Secondary | ICD-10-CM | POA: Diagnosis not present

## 2015-12-16 DIAGNOSIS — G894 Chronic pain syndrome: Secondary | ICD-10-CM | POA: Diagnosis not present

## 2015-12-16 DIAGNOSIS — D72819 Decreased white blood cell count, unspecified: Secondary | ICD-10-CM | POA: Diagnosis not present

## 2015-12-29 MED FILL — HYDROCHLOROTHIAZIDE 25 MG T: 25 | 90 days supply | Qty: 90 | Fill #1

## 2015-12-29 MED FILL — DULoxetine HCL 30 MG CPEP: 30 | 90 days supply | Qty: 180 | Fill #1

## 2015-12-30 MED FILL — ZOLPIDEM TARTRATE 10 MG TAB: 10 | 90 days supply | Qty: 90 | Fill #1

## 2016-01-05 MED FILL — traMADol HCL 50 MG TABS: 50 | 90 days supply | Qty: 180 | Fill #1

## 2016-01-15 MED FILL — VYVANSE 20 MG CAPSULE: 20 | 30 days supply | Qty: 30 | Fill #0

## 2016-02-23 MED FILL — VYVANSE 20 MG CAPSULE: 20 | 30 days supply | Qty: 30 | Fill #0

## 2016-02-24 DIAGNOSIS — H10013 Acute follicular conjunctivitis, bilateral: Secondary | ICD-10-CM | POA: Diagnosis not present

## 2016-02-24 MED FILL — LOTEMAX 0.5% GEL: 0.5 | 25 days supply | Qty: 5 | Fill #0

## 2016-03-09 ENCOUNTER — Ambulatory Visit (INDEPENDENT_AMBULATORY_CARE_PROVIDER_SITE_OTHER): Payer: 59 | Admitting: Family Medicine

## 2016-03-09 ENCOUNTER — Encounter: Payer: Self-pay | Admitting: Family Medicine

## 2016-03-09 VITALS — BP 106/60 | HR 95 | Temp 98.4°F | Resp 12 | Ht 62.5 in | Wt 140.0 lb

## 2016-03-09 DIAGNOSIS — I10 Essential (primary) hypertension: Secondary | ICD-10-CM | POA: Diagnosis not present

## 2016-03-09 DIAGNOSIS — Z760 Encounter for issue of repeat prescription: Secondary | ICD-10-CM

## 2016-03-09 DIAGNOSIS — G4700 Insomnia, unspecified: Secondary | ICD-10-CM | POA: Diagnosis not present

## 2016-03-09 DIAGNOSIS — F5104 Psychophysiologic insomnia: Secondary | ICD-10-CM

## 2016-03-09 DIAGNOSIS — F909 Attention-deficit hyperactivity disorder, unspecified type: Secondary | ICD-10-CM

## 2016-03-09 DIAGNOSIS — E785 Hyperlipidemia, unspecified: Secondary | ICD-10-CM | POA: Diagnosis not present

## 2016-03-09 DIAGNOSIS — J309 Allergic rhinitis, unspecified: Secondary | ICD-10-CM | POA: Insufficient documentation

## 2016-03-09 DIAGNOSIS — G894 Chronic pain syndrome: Secondary | ICD-10-CM | POA: Diagnosis not present

## 2016-03-09 LAB — BASIC METABOLIC PANEL
BUN: 16 mg/dL (ref 6–23)
CO2: 33 mEq/L — ABNORMAL HIGH (ref 19–32)
Calcium: 9.7 mg/dL (ref 8.4–10.5)
Chloride: 99 mEq/L (ref 96–112)
Creatinine, Ser: 0.69 mg/dL (ref 0.40–1.20)
GFR: 93.23 mL/min (ref >60.00)
Glucose, Bld: 85 mg/dL (ref 70–99)
Potassium: 3.4 mEq/L — ABNORMAL LOW (ref 3.5–5.1)
Sodium: 138 mEq/L (ref 135–145)

## 2016-03-09 MED ORDER — LISDEXAMFETAMINE DIMESYLATE 20 MG PO CAPS: 20.0000 mg | ORAL_CAPSULE | Freq: Every day | ORAL | Status: DC

## 2016-03-09 MED ORDER — FLUTICASONE PROPIONATE 50 MCG/ACT NA SUSP: 1.0000 | Freq: Two times a day (BID) | NASAL | Status: DC

## 2016-03-09 MED ORDER — DULOXETINE HCL 30 MG PO CPEP: 30.0000 mg | ORAL_CAPSULE | Freq: Two times a day (BID) | ORAL | Status: DC

## 2016-03-09 MED ORDER — HYDROCHLOROTHIAZIDE 25 MG PO TABS: 12.5000 mg | ORAL_TABLET | Freq: Every day | ORAL | Status: DC

## 2016-03-09 NOTE — Progress Notes (Signed)
HPI:   [name] is a 55 y.o. female, who is here today to establish care with me.  Former PCP: Dr Tammy [name]. Last preventive routine visit: 07/2015. She follows annually with gyn periodically.   Concerns today: medications refills.  Hx of depression and "nerve pain", insomnia, HLD,and HTN among some.  Insomnia:  Since 2010 she has been on Ambien 10 mg nightly, she sleeps about 6 hours, she still wakes up a few times , attributed to pain. She states that she cannot sleep unless she takes Am,bien. She tried Trazodone in the past and next day she felt "foggy". Amitriptyline was not well tolerated.  ADD:  According to pt, it was Dx by her gynecologists. She has been on Vyvanse  20 mg, which helps with concentration and focusing. She takes medication daily.  She follows once per year with her gyn.  She does not feel like Vyvanse aggravates depression , denies any side effect and she even feels like it even helps with managing her pain. She would like for me to continue managing this problem.   Chronic pain: Hx of cervical, thoracic, and lumbar pain+fibromyalgia.She takes OTC Tylenol and does stretching exercises, also takes Cymbalta 30 mg bid.  She has had epidural cervical injection. Pain feels like electricity and can be on cervical and upper thoracic, occipital and occasionally mandibular, bilateral. Intermittent and transient numbness sensation on different area of her back and LE. Exacerbated by prolonged rest in same position and relived with movement. No focal weakness. Pain is constant, bearable most of the time. No limitation on her daily activities.  11/2010 cervical [id number] disc degeneration and spondylosis as above.  This causes mild spinal stenosis at C3-4 and mild to moderate spinal stenosis at C4-5 and C5-6.  There is mild spinal stenosis at C6-7.  10/2011: thoracic and lumbar MRI: Mild thoracic spondylosis with multilevel disc bulging.  C6-C7  spondylosis with mild central stenosis and bilateral foraminal stenosis. L4-L5 predominant lumbar spondylosis. Moderate central stenosis with bilateral lateral recess stenosis.  The central annular tear and broad-based disc protrusion.  Facet hypertrophy and ligamentum flavum redundancy with right facet arthritis and small facet Effusion. L3-L4 and L2-L3 disc desiccation with shallow disc bulging. L5-S1 mild to moderate bilateral facet arthrosis.  No stenosis.  Hypertension: Currently she is on HCTZ 25 mg daily. She is taking medications as instructed, no side effects reported.  She has not noted unusual headache, visual changes, exertional chest pain, dyspnea,  focal weakness, or edema.  Lab Results  Component Value Date   CREATININE 0.74 08/12/2015   BUN 17 08/12/2015   NA 140 08/12/2015   K 3.9 08/12/2015   CL 101 08/12/2015   CO2 31 08/12/2015    Allergies: Loratadine 10 mg daily. Still having itchy eyes. + Nasal congestion and rhinorrhea. No fever, chills, or sick contact.   Hyperlipidemia:  She is on non pharmacologic treatment. Following a low fat diet.    Lab Results  Component Value Date   CHOL 213* 08/12/2015   HDL 74.10 08/12/2015   LDLCALC 124* 08/12/2015   TRIG 76.0 08/12/2015   CHOLHDL 3 08/12/2015     Review of Systems  Constitutional: Negative for activity change, appetite change, fatigue and unexpected weight change.  HENT: Positive for congestion, rhinorrhea and sneezing. Negative for facial swelling, mouth sores, sore throat, trouble swallowing and voice change.   Eyes: Positive for itching. Negative for photophobia, discharge, redness and visual disturbance.  Respiratory: Negative for  cough, shortness of breath and wheezing.   Cardiovascular: Negative for chest pain, palpitations and leg swelling.  Gastrointestinal: Negative for nausea, vomiting and abdominal pain.  Genitourinary: Negative for dysuria, hematuria and decreased urine volume.    Musculoskeletal: Positive for myalgias, back pain, neck pain and neck stiffness. Negative for gait problem.  Skin: Negative for color change and rash.  Neurological: Positive for numbness. Negative for dizziness, tremors, syncope, weakness and headaches.  Hematological: Negative for adenopathy.  Psychiatric/Behavioral: Positive for sleep disturbance and decreased concentration (stable with medication). Negative for suicidal ideas, hallucinations, behavioral problems and confusion. The patient is nervous/anxious.       Current Outpatient Prescriptions on File Prior to Visit  Medication Sig Dispense Refill  . zolpidem (AMBIEN) 10 MG tablet Take 1 tablet (10 mg total) by mouth at bedtime as needed for sleep. 90 tablet 1   No current facility-administered medications on file prior to visit.     Past Medical History  Diagnosis Date  . Neuropathy ([place])   . Hypertension   . Chronic fatigue syndrome   . Arthritis    Allergies  Allergen Reactions  . Eggs Or Egg-Derived Products Other (See Comments)    Makes pt feel like burping up rotten eggs    Family History  Problem Relation Age of Onset  . Heart attack Maternal Grandmother 50  . Arthritis Maternal Grandmother   . Hyperlipidemia Maternal Grandmother   . Heart disease Maternal Grandmother   . Diabetes Maternal Grandmother   . Heart attack Brother   . Colon cancer Neg Hx   . Colon polyps Father   . Esophageal cancer Neg Hx   . Kidney disease Neg Hx     Social History   Social History  . Marital Status: Married    Spouse Name: N/A  . Number of Children: 2  . 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 Smoking status: Never Smoker   . Smokeless tobacco: None  . Alcohol Use: No  . Drug Use: No  . Sexual Activity: Not Asked   Other Topics Concern  . None   Social History Narrative    Filed Vitals:   03/09/16 1254  BP: 106/60  Pulse: 95  Temp:  98.4 F (36.9 C)  Resp: 12    Body mass index is 25.18 kg/(m^2).  SpO2 Readings from Last 3 Encounters:  03/09/16 98%  10/28/14 98%  04/16/13 99%     Physical Exam  Constitutional: She is oriented to person, place, and time. She appears well-developed and well-nourished. No distress.  HENT:  Head: Atraumatic.  Nose: No sinus tenderness. Right sinus exhibits no maxillary sinus tenderness and no frontal sinus tenderness. Left sinus exhibits no maxillary sinus tenderness and no frontal sinus tenderness.  Mouth/Throat: Uvula is midline, oropharynx is clear and moist and mucous membranes are normal.  Eyes: Conjunctivae and EOM are normal. Pupils are equal, round, and reactive to light.  Cardiovascular: Normal rate and regular rhythm.   No murmur heard. Pulses:      Dorsalis pedis pulses are 2+ on the right side, and 2+ on the left side.  Respiratory: Effort normal and breath sounds normal. No respiratory distress.  GI: Soft. She exhibits no mass. There is no tenderness.  Musculoskeletal: She exhibits no edema.  No significant deformity appreciated. No tenderness upon palpation of paraspinal muscles. Pain is not elicited with movement on exam table during examination. No  trigger points.     Lymphadenopathy:    She has no cervical adenopathy.  Neurological: She is alert and oriented to person, place, and time. She has normal strength. Coordination and gait normal.  Reflex Scores:      Patellar reflexes are 2+ on the right side and 2+ on the left side. SLR negative bilateral.  Skin: Skin is warm. No erythema.  Psychiatric: She has a normal mood and affect.  Well groomed, good eye contact.      ASSESSMENT AND PLAN:     Tammy [name] was seen today for transfer from [facility].  Diagnoses and all orders for this visit:  Essential hypertension  Her pressure today is on the lower normal range, recommended decreasing hydrochlorothiazide from 25 mg to 12.5 mg daily. Continue low-salt  diet, periodic eye exam. Instructed to monitor blood pressure at home. She also has history of hypokalemia, today BMP done, further recommendations would be given accordingly. Follow-up in 4 months, before if needed.  -     Basic metabolic panel -     hydrochlorothiazide (HYDRODIURIL) 25 MG tablet; Take 0.5 tablets (12.5 mg total) by mouth daily.  Hyperlipidemia  She is not interested in startin medication. Continue low-fat diet, she is not fasting today so planning on repeating lipid panel next office visit.  Chronic insomnia  It does not seem to be well controlled on Ambien 10 mg,she states that her main problem was falling asleep and this is much better.She can tolerate waking up a few times per night and prefers no changes in management. I recommend trying to decrease dose to 5 mg a few times during the month. Some side effects were discussed. Good sleep hygiene. Follow-up in 4 months.  Chronic pain syndrome  He seems adequately controlled, no changes in current management. She prefers to continue Cymbalta 30 mg twice daily rather than 60 mg cap daily Stretching and low impact exercises. Continue Tylenol over-the-counter, max dose 3000 mg daily. Follow-up in 4 months.  -     DULoxetine (CYMBALTA) 30 MG capsule; Take 1 capsule (30 mg total) by mouth 2 (two) times daily.   Attention deficit hyperactivity disorder (ADHD), unspecified ADHD type  I agree with continuing managing ADHD since he seems to be well controlled on current management. Some side effects discussed. Today she received 3 prescriptions, medication contract to be signed next office visit.    -     lisdexamfetamine (VYVANSE) 20 MG capsule; Take 1 capsule (20 mg total) by mouth daily.  Allergic rhinitis, unspecified allergic rhinitis type  She is still symptomatic, she can try over-the-counter Zyrtec 10 mg he states loratadine. Flonase nasal spray twice daily as needed. Follow-up as needed.  -      fluticasone (FLONASE) 50 MCG/ACT nasal spray; Place 1 spray into both nostrils 2 (two) times daily.          Tammy Gritz G. [place], MD  [facility]. [place] office.

## 2016-03-09 NOTE — Progress Notes (Signed)
Pre visit review using our clinic review tool, if applicable. No additional management support is needed unless otherwise documented below in the visit note. 

## 2016-03-09 NOTE — Patient Instructions (Signed)
A few things to remember from today's visit:   1. Essential hypertension  - Basic metabolic panel - hydrochlorothiazide (HYDRODIURIL) 25 MG tablet; Take 0.5 tablets (12.5 mg total) by mouth daily.  Dispense: 90 tablet; Refill: 0  2. Hyperlipidemia   3. Chronic insomnia   4. Chronic pain syndrome  - DULoxetine (CYMBALTA) 30 MG capsule; Take 1 capsule (30 mg total) by mouth 2 (two) times daily.  Dispense: 180 capsule; Refill: 1  5. Medication refill  - hydrochlorothiazide (HYDRODIURIL) 25 MG tablet; Take 0.5 tablets (12.5 mg total) by mouth daily.  Dispense: 90 tablet; Refill: 0 - DULoxetine (CYMBALTA) 30 MG capsule; Take 1 capsule (30 mg total) by mouth 2 (two) times daily.  Dispense: 180 capsule; Refill: 1  6. Attention deficit hyperactivity disorder (ADHD), unspecified ADHD type  - lisdexamfetamine (VYVANSE) 20 MG capsule; Take 1 capsule (20 mg total) by mouth daily.  Dispense: 30 capsule; Refill: 0  Today HCTZ decreased. Blood pressure goal for most people is less than 140/90.today slightly low.   Elevated blood pressure increases the risk of strokes, heart and kidney disease, and eye problems. Regular physical activity and a healthy diet (DASH diet) usually help. Low salt diet. Take medications as instructed. Caution with some over the counter medications as cold medications, dietary products (for weight loss), and Ibuprofen or Aleve (frequent use);all these medications could cause elevation of blood pressure.      If you sign-up for My chart, you can communicate easier with [place] in case you have any question or concern.

## 2016-03-11 ENCOUNTER — Encounter: Payer: Self-pay | Admitting: Family Medicine

## 2016-03-26 ENCOUNTER — Telehealth: Payer: Self-pay | Admitting: Family Medicine

## 2016-03-26 MED ORDER — ZOLPIDEM TARTRATE 10 MG PO TABS: ORAL_TABLET | ORAL | Status: DC

## 2016-03-26 MED FILL — HYDROCHLOROTHIAZIDE 25 MG T: 25 | 90 days supply | Qty: 45 | Fill #0

## 2016-03-26 MED FILL — FLUTICASONE PROP 50 MCG SPR: 50 | 30 days supply | Qty: 16 | Fill #0

## 2016-03-26 MED FILL — DULoxetine HCL 30 MG CPEP: 30 | 90 days supply | Qty: 180 | Fill #0

## 2016-03-26 NOTE — Telephone Encounter (Signed)
She had Rx refilled 12/30/15 # 90, if she did get 90 tabs she is supposed to have 2-3 days left.   Fill date: 03/29/16: Ambien 10 mg can be called in to continue 1/2-1 tab at bedtime as needed # 30/3. Thanks, BJ

## 2016-03-26 NOTE — Telephone Encounter (Signed)
Pt request refill zolpidem (AMBIEN) 10 MG tablet  [facility] out pt pharm

## 2016-03-26 NOTE — Telephone Encounter (Signed)
Rx called in to pharmacy. 

## 2016-03-29 MED FILL — ZOLPIDEM TARTRATE 10 MG TAB: 10 | 30 days supply | Qty: 30 | Fill #0

## 2016-03-29 MED FILL — VYVANSE 20 MG CAPSULE: 20 | 30 days supply | Qty: 30 | Fill #0

## 2016-05-02 MED FILL — ZOLPIDEM TARTRATE 10 MG TAB: 10 | 30 days supply | Qty: 30 | Fill #1

## 2016-05-03 MED FILL — VYVANSE 20 MG CAPSULE: 20 | 30 days supply | Qty: 30 | Fill #0

## 2016-05-18 ENCOUNTER — Telehealth: Payer: Self-pay

## 2016-05-18 ENCOUNTER — Encounter: Payer: Self-pay | Admitting: Family Medicine

## 2016-05-18 ENCOUNTER — Other Ambulatory Visit: Payer: Self-pay | Admitting: Family Medicine

## 2016-05-18 DIAGNOSIS — I10 Essential (primary) hypertension: Secondary | ICD-10-CM

## 2016-05-18 MED ORDER — HYDROCHLOROTHIAZIDE 25 MG PO TABS: 25.0000 mg | ORAL_TABLET | Freq: Every day | ORAL | 0 refills | Status: DC

## 2016-05-18 NOTE — Telephone Encounter (Signed)
Because BP was on lower range I recommended decreasing dose of HCTZ. If she wants to continue one tab daily please ask her to monitor BP at home. Thanks, BJ

## 2016-05-18 NOTE — Telephone Encounter (Signed)
Rx sent in & pt aware to monitor bp.

## 2016-05-18 NOTE — Telephone Encounter (Signed)
Received a mychart message about patient's HCTZ. It was decreased to 1/2 a tablet during her office visit because her blood pressure was on the lower side of the normal range. She said in her message that she has been taking a full tablet for years.

## 2016-05-19 ENCOUNTER — Other Ambulatory Visit: Payer: Self-pay

## 2016-05-19 DIAGNOSIS — I10 Essential (primary) hypertension: Secondary | ICD-10-CM

## 2016-05-19 MED ORDER — HYDROCHLOROTHIAZIDE 25 MG PO TABS: 25.0000 mg | ORAL_TABLET | Freq: Every day | ORAL | 0 refills | Status: DC

## 2016-05-19 MED FILL — HYDROCHLOROTHIAZIDE 25 MG T: 25 | 90 days supply | Qty: 90 | Fill #0

## 2016-05-30 MED FILL — ZOLPIDEM TARTRATE 10 MG TAB: 10 | 30 days supply | Qty: 30 | Fill #2

## 2016-06-08 MED FILL — VYVANSE 20 MG CAPSULE: 20 | 30 days supply | Qty: 30 | Fill #0

## 2016-07-01 MED FILL — ZOLPIDEM TARTRATE 10 MG TAB: 10 | 30 days supply | Qty: 30 | Fill #3

## 2016-07-13 ENCOUNTER — Encounter: Payer: Self-pay | Admitting: Family Medicine

## 2016-07-13 ENCOUNTER — Ambulatory Visit (INDEPENDENT_AMBULATORY_CARE_PROVIDER_SITE_OTHER): Payer: 59 | Admitting: Family Medicine

## 2016-07-13 VITALS — BP 110/70 | HR 105 | Resp 12 | Ht 62.5 in | Wt 144.0 lb

## 2016-07-13 DIAGNOSIS — R202 Paresthesia of skin: Secondary | ICD-10-CM

## 2016-07-13 DIAGNOSIS — G8929 Other chronic pain: Secondary | ICD-10-CM

## 2016-07-13 DIAGNOSIS — I10 Essential (primary) hypertension: Secondary | ICD-10-CM

## 2016-07-13 DIAGNOSIS — E876 Hypokalemia: Secondary | ICD-10-CM | POA: Diagnosis not present

## 2016-07-13 DIAGNOSIS — F5104 Psychophysiologic insomnia: Secondary | ICD-10-CM

## 2016-07-13 DIAGNOSIS — M549 Dorsalgia, unspecified: Secondary | ICD-10-CM

## 2016-07-13 DIAGNOSIS — R5383 Other fatigue: Secondary | ICD-10-CM

## 2016-07-13 DIAGNOSIS — F909 Attention-deficit hyperactivity disorder, unspecified type: Secondary | ICD-10-CM

## 2016-07-13 DIAGNOSIS — R5381 Other malaise: Secondary | ICD-10-CM

## 2016-07-13 MED ORDER — LISDEXAMFETAMINE DIMESYLATE 20 MG PO CAPS: 20.0000 mg | ORAL_CAPSULE | Freq: Every day | ORAL | 0 refills | Status: DC

## 2016-07-13 MED ORDER — ZOLPIDEM TARTRATE 10 MG PO TABS: ORAL_TABLET | ORAL | 1 refills | Status: DC

## 2016-07-13 MED ORDER — HYDROCHLOROTHIAZIDE 25 MG PO TABS: 25.0000 mg | ORAL_TABLET | Freq: Every day | ORAL | 1 refills | Status: DC

## 2016-07-13 MED ORDER — GABAPENTIN 100 MG PO CAPS: 300.0000 mg | ORAL_CAPSULE | Freq: Every day | ORAL | 1 refills | Status: DC

## 2016-07-13 MED FILL — GABAPENTIN 100 MG CAPSULE: 100 | 30 days supply | Qty: 90 | Fill #0

## 2016-07-13 NOTE — Progress Notes (Signed)
HPI:   [name] is a 55 y.o. female, who is here today to follow on some of her chronic medical problems.  Hypertension: Currently she is on HCTZ 25 mg, last OV she was recommended decreasing dose to 12.5 mg daily but she said taking 25 mg. Hx of hypoK+, she is not on KCL supplementation.  Reporting no side effects from medication. She  denies any frequent/severe headache, visual changes, chest pain, dyspnea, palpitation, abdominal pain, nausea, vomiting, or edema.  Lab Results  Component Value Date   CREATININE 0.69 03/09/2016   BUN 16 03/09/2016   NA 138 03/09/2016   K 3.4 (L) 03/09/2016   CL 99 03/09/2016   CO2 33 (H) 03/09/2016    Chronic pain: History of chronic cervical,thoracic,and lower back pain and fibromyalgia. Electricity like shooting pain intermittently, mainly left UE and LLE but has had them on other areas, no rash or focal weakness. She is on Cymbalta 30 mg, she was supposed to be on 60 mg daily, she denies side effects. Pain is "bad", exacerbated by prolonged sitting, standing, or walking. Alleviated with change of position and stretching exercises.  Cervical/ thoracic pain and "shooting/electricity" like pain has been worse in the past few days, attributed to weather changes. No new associated symptoms. She denies having anesthesia or urine/bowel incontinence.  She would like to try PT.  11/2010 cervical MRI: Multilevel disc degeneration and spondylosis, mild spinal stenosis at C3-4 and mild to moderate spinal stenosis at C4-5 and C5-6.  There is mild spinal stenosis at C6-7.   10/2011: thoracic and lumbar MRI: Mild thoracic spondylosis with multilevel disc bulging.  C6-C7 spondylosis with mild central stenosis and bilateral foraminal stenosis. L4-L5 predominant lumbar spondylosis. Moderate central stenosis with bilateral lateral recess stenosis.  The central annular tear and broad-based disc protrusion.  Facet hypertrophy and ligamentum  flavum redundancy with right facet arthritis and small facet Effusion. L3-L4 and L2-L3 disc desiccation with shallow disc bulging. L5-S1 mild to moderate bilateral facet arthrosis.  No stenosis.   She states that in the past she was on pain medications but she weaned herself off.   Insomnia: Taking Ambien every day, 5 mg, sleeping about 6 hours. She cannot sleep at all without Ambien. She is concerned about taking this medication for the time she has done so, wonders if a special "compound" can be order for slow weaning off without withdrawal symptoms. She states that she tried to stop it a few years ago and couldn't sleep at all + episodes of "startling." She has tried trazodone and amitriptyline in the past but they were not well tolerated.  Anxiety and depression, she doesn't feel like symptoms are getting worse, exacerbated by chronic pain. She denies suicidal thoughts.  Pain exacerbates insomnia.     Review of Systems  Constitutional: Positive for fatigue. Negative for activity change, appetite change, fever and unexpected weight change.  HENT: Negative for mouth sores, nosebleeds and trouble swallowing.   Eyes: Negative for redness and visual disturbance.  Respiratory: Negative for cough, shortness of breath and wheezing.   Cardiovascular: Negative for chest pain, palpitations and leg swelling.  Gastrointestinal: Negative for abdominal pain, nausea and vomiting.       Negative for changes in bowel habits.  Genitourinary: Negative for decreased urine volume, difficulty urinating and hematuria.  Musculoskeletal: Positive for arthralgias, back pain, myalgias and neck pain. Negative for gait problem.  Skin: Negative for color change and rash.  Neurological: Negative for  seizures, syncope, weakness and headaches.  Hematological: Negative for adenopathy. Does not bruise/bleed easily.  Psychiatric/Behavioral: Positive for sleep disturbance. Negative for confusion and suicidal ideas.  The patient is nervous/anxious.       Current Outpatient Prescriptions on File Prior to Visit  Medication Sig Dispense Refill  . DULoxetine (CYMBALTA) 30 MG capsule Take 1 capsule (30 mg total) by mouth 2 (two) times daily. 180 capsule 1  . fluticasone (FLONASE) 50 MCG/ACT nasal spray Place 1 spray into both nostrils 2 (two) times daily. 16 g 6  . hydrochlorothiazide (HYDRODIURIL) 25 MG tablet Take 1 tablet (25 mg total) by mouth daily. 90 tablet 0   No current facility-administered medications on file prior to visit.      Past Medical History:  Diagnosis Date  . Arthritis   . Chronic fatigue syndrome   . Hypertension   . Neuropathy ([facility])    Allergies  Allergen Reactions  . Eggs Or Egg-Derived Products Other (See Comments)    Makes pt feel like burping up rotten eggs    Social History   Social History  . Marital status: Married    Spouse name: N/A  . Number of children: 2  . 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 Smoking status: Never Smoker  . Smokeless tobacco: None  . Alcohol use No  . Drug use: No  . Sexual activity: Not Asked   Other Topics Concern  . None   Social History Narrative  . None    Vitals:   07/13/16 1055  BP: 110/70  Pulse: (!) 105  Resp: 12    O2 sat at RA 99%   Body mass index is 25.92 kg/m.     Physical Exam  Nursing note and vitals reviewed. Constitutional: She is oriented to person, place, and time. She appears well-developed and well-nourished. No distress.  HENT:  Head: Atraumatic.  Mouth/Throat: Oropharynx is clear and moist and mucous membranes are normal.  Eyes: Conjunctivae and EOM are normal.  Neck: Muscular tenderness present. Decreased range of motion (Mild, mainly flexion) present. No tracheal deviation, no edema and no erythema present. No thyroid mass and no thyromegaly present.  Cardiovascular: Regular rhythm.   No murmur heard. Pulses:       Dorsalis pedis pulses are 2+ on the right side, and 2+ on the left side.  Mild tachycardia  Respiratory: Effort normal and breath sounds normal. No respiratory distress.  GI: Soft. She exhibits no mass. There is no tenderness.  Musculoskeletal: She exhibits no edema.  Shoulder ROM normal, no pain elicited bilateral. + Mild tenderness upon palpation of paraspinal muscles (cervical,throracic,and lumbar) bilateral. Pain is not elicited with movement on exam table during examination. + trigger points on upper and lower back, upper and lower extremities as well as chest wall bilateral.  Lymphadenopathy:    She has no cervical adenopathy.  Neurological: She is alert and oriented to person, place, and time. She has normal strength. Coordination and gait normal.  SLR negative bilateral.  Skin: Skin is warm. No erythema.  Psychiatric: Her speech is normal. Her mood appears anxious. Her affect is labile. Cognition and memory are normal. She expresses no suicidal ideation.  Well groomed, good eye contact.      ASSESSMENT AND PLAN:     Tammy [name] was seen today for follow-up.  Diagnoses and all orders for this visit:    Chronic insomnia  Adequately controlled. We  dicussed some side effects of Ambien and how we can wean off medication, she decided finally not to try weaning medication now; so continue 5 mg at night. Gabapentin may also help. Good sleep hygiene. F/U in 3-4 months.  -     gabapentin (NEURONTIN) 100 MG capsule; Take 3 capsules (300 mg total) by mouth at bedtime. -     zolpidem (AMBIEN) 10 MG tablet; Take 1/2 tablet by mouth at bedtime as needed.  Malaise and fatigue  Fibromyalgia. Gabapentin side effects discussed, she agrees with trying. Instructed starting 100 mg at bedtime and titrate as tolerated to 300 mg at night. PT will be arranged. Baclofen or Zanaflex could also be considered.   -     Ambulatory referral to Physical Therapy -     gabapentin (NEURONTIN) 100  MG capsule; Take 3 capsules (300 mg total) by mouth at bedtime.  Paresthesias  This seems to be chronic and unchanged, could be related to fibromyalgia. Neurologic examination normal otherwise. Because similar symptoms she had brain MRI 11/2010:No significant abnormalities detected.  Negative for cerebral ischemic change or demyelinating disease.  Gabapentin may also help.   -     gabapentin (NEURONTIN) 100 MG capsule; Take 3 capsules (300 mg total) by mouth at bedtime.  Essential hypertension   Adequately controlled. No changes in current management.HCTZ side effects disscused. Low diet and periodic eye exam. F/U in 6 months, before if needed.  -     Basic metabolic panel; Future  Hypokalemia  Further recommendations will be given according to lab results. She will have lab done at [facility], where she works, has no time today.  -     Basic metabolic panel; Future  Chronic bilateral back pain, unspecified back location  Cymbalta increased from 30 mg to 60 mg daily, if she tolerates well cap strength can be changed from 30-60 Mg. PT to be arranged. We discuss options for pain management, not interested for now.  F/U in 3-4 months.  -     Ambulatory referral to Physical Therapy  Attention deficit hyperactivity disorder (ADHD), unspecified ADHD type  Stable. No changes in current management, aware of side effects. F/U in 3-4 months.   -     Discontinue: lisdexamfetamine (VYVANSE) 20 MG capsule; Take 1 capsule (20 mg total) by mouth daily. -     Discontinue: lisdexamfetamine (VYVANSE) 20 MG capsule; Take 1 capsule (20 mg total) by mouth daily. -     lisdexamfetamine (VYVANSE) 20 MG capsule; Take 1 capsule (20 mg total) by mouth daily.          -Ms. Tammy [name] was advised to return sooner than planned today if new concerns arise.       Rockney Grenz G. [place], MD  [facility]. [place] office.

## 2016-07-13 NOTE — Patient Instructions (Signed)
A few things to remember from today's visit:   Essential hypertension - Plan: Basic metabolic panel  Chronic insomnia - Plan: zolpidem (AMBIEN) 10 MG tablet  Malaise and fatigue - Plan: Ambulatory referral to Physical Therapy  Hypokalemia - Plan: Basic metabolic panel  Chronic bilateral back pain, unspecified back location - Plan: Ambulatory referral to Physical Therapy  Paresthesias  Attention deficit hyperactivity disorder (ADHD), unspecified ADHD type - Plan: lisdexamfetamine (VYVANSE) 20 MG capsule, DISCONTINUED: lisdexamfetamine (VYVANSE) 20 MG capsule, DISCONTINUED: lisdexamfetamine (VYVANSE) 20 MG capsule  Increase Cymbalta to 60 mg am , start Gabapentin and titrate to 300 mg at bedtime. No changes in Ambien.   Please be sure medication list is accurate. If a new problem present, please set up appointment sooner than planned today.

## 2016-07-13 NOTE — Progress Notes (Signed)
Pre visit review using our clinic review tool, if applicable. No additional management support is needed unless otherwise documented below in the visit note. 

## 2016-07-15 ENCOUNTER — Ambulatory Visit: Payer: 59 | Attending: Family Medicine | Admitting: Physical Therapy

## 2016-07-15 DIAGNOSIS — M542 Cervicalgia: Secondary | ICD-10-CM | POA: Insufficient documentation

## 2016-07-15 DIAGNOSIS — M545 Low back pain: Secondary | ICD-10-CM | POA: Insufficient documentation

## 2016-07-15 DIAGNOSIS — R252 Cramp and spasm: Secondary | ICD-10-CM | POA: Diagnosis not present

## 2016-07-15 DIAGNOSIS — M6281 Muscle weakness (generalized): Secondary | ICD-10-CM | POA: Diagnosis not present

## 2016-07-15 DIAGNOSIS — G8929 Other chronic pain: Secondary | ICD-10-CM | POA: Diagnosis not present

## 2016-07-15 NOTE — Therapy (Signed)
[facility] [address] [place], [place], [address] Phone: [phone]   Fax:  [phone]  Physical Therapy Evaluation  Patient Details  Name: Tammy [name] MRN: [id number] Date of Birth: 10/21/1960 Referring Provider: Dr. Martinique  Encounter Date: 07/15/2016      PT End of Session - 07/15/16 1009    Visit Number 1   Date for PT Re-Evaluation 09/09/16   Authorization Type UMR   PT Start Time 0803   PT Stop Time 0850   PT Time Calculation (min) 47 min   Activity Tolerance Patient tolerated treatment well      Past Medical History:  Diagnosis Date  . Arthritis   . Chronic fatigue syndrome   . Hypertension   . Neuropathy [facility])     Past Surgical History:  Procedure Laterality Date  . CARDIAC CATHETERIZATION  09/18/2012  . LEFT HEART CATHETERIZATION WITH CORONARY ANGIOGRAM N/A 09/18/2012   Procedure: LEFT HEART CATHETERIZATION WITH CORONARY ANGIOGRAM;  Surgeon: Sherren Mocha, MD;  Location: [facility] CATH LAB;  Service: Cardiovascular;  Laterality: N/A;  . TONSILLECTOMY  1965    There were no vitals filed for this visit.       Subjective Assessment - 07/15/16 0811    Subjective Spine issues since 2010 from neck trauma during dental extracted;  paresthesia neck and head;   chronic issues recently exacerbated for no apparent reason;   cervical spondylosis and lumbar stenosis;  1 week ago had pain flare up neck, shoulders and headache;  had deep tissue massage helped for 24 hours.   Pain locks up and limits ROM.  Has recently increased work hours (therapist/counseling);     Pertinent History fibromyalgia;  goes by "[name]"   Limitations Sitting;Reading   How long can you sit comfortably? can't sit through a movie;  15 min   Diagnostic tests MRI years ago   Patient Stated Goals treat this flare up; nervous that something else is happening; spine health;  What pain to work through and what not to; fearful    Currently in Pain? Yes   Pain Score 7    Pain Location Neck   Pain Orientation Right;Left   Pain Type Chronic pain   Pain Radiating Towards left jaw, head and lower back   Pain Onset 1 to 4 weeks ago  recent flare up   Pain Frequency Constant   Aggravating Factors  sitting; as the day goes on   Pain Relieving Factors deep tissue massage helps short term, self massage, has home TENS unit;  shift around            [facility] PT Assessment - 07/15/16 0001      Assessment   Medical Diagnosis chronic bilateral back pain, unspecific location   Referring Provider Dr. Martinique   Onset Date/Surgical Date --  1 week of exacerbation   Hand Dominance Left   Next MD Visit as needed   Prior Therapy [facility] 2-3 years ago U/S     Precautions   Precautions None   Precaution Comments fibromyalgia; self limits from lifting, vacumning     Restrictions   Weight Bearing Restrictions No     Balance Screen   Has the patient fallen in the past 6 months No   Has the patient had a decrease in activity level because of a fear of falling?  No   Is the patient reluctant to leave their home because of a fear of falling?  No  Home Environment   Living Environment Private residence   Living Arrangements Spouse/significant other;Children     Prior Function   Level of Independence Independent with basic ADLs   Vocation Full time employment   Vocation Requirements therapist (lot of sitting)     Observation/Other Assessments   Focus on Therapeutic Outcomes (FOTO)  41% limitation      Posture/Postural Control   Postural Limitations Rounded Shoulders;Forward head     ROM / Strength   AROM / PROM / Strength AROM;Strength     AROM   AROM Assessment Site Cervical;Lumbar   Cervical Flexion 30   Cervical Extension 40   Cervical - Right Side Bend 28   Cervical - Left Side Bend 15   Cervical - Right Rotation 25   Cervical - Left Rotation 23   Lumbar Flexion 70   Lumbar Extension 10   Lumbar -  Right Side Bend 30   Lumbar - Left Side Bend 40     Strength   Overall Strength Comments UE strength grossly 4/5   Strength Assessment Site Shoulder;Cervical   Right/Left Shoulder Right;Left   Cervical Flexion 4-/5   Cervical Extension 4-/5     Palpation   Palpation comment Tender cervical paraspinals, upper traps, levator, rhomboids, suboccipitals     Special Tests   Cervical Tests Dictraction     Distraction Test   Findngs Positive                           PT Education - 07/15/16 1008    Education provided Yes   Education Details postural education sitting use of lumbar roll; supine cervical retractions   Person(s) Educated Patient   Methods Explanation;Handout;Demonstration   Comprehension Verbalized understanding          PT Short Term Goals - 07/15/16 2221      PT SHORT TERM GOAL #1   Title The patient will demonstrate understanding of postural awareness and basic self care strategies and exercise  08/12/16   Time 4   Period Weeks   Status New     PT SHORT TERM GOAL #2   Title The patient will report a 30% reduction in spinal pain (neck and/or back)   Time 4   Period Weeks   Status New     PT SHORT TERM GOAL #3   Title Cervical rotation improved to 30 degrees needed for driving   Time 4   Period Weeks   Status New           PT Long Term Goals - 07/15/16 2224      PT LONG TERM GOAL #1   Title The patient will be independent in safe self progression of HEP and self care strategies including work place ergonomics  09/09/16   Time 8   Period Weeks   Status New     PT LONG TERM GOAL #2   Title The patient will have improved cervical flexion to 35 degrees, extension to 45 degrees and left sidebending to 25 degrees needed for ADLs including driving   Time 8   Period Weeks   Status New     PT LONG TERM GOAL #3   Title The patient will have deep cervical muscle strength and periscapular strength to at least 4/5 needed for  endurance for sitting at work longer periods of time   Time 8   Period Weeks   Status New     PT  LONG TERM GOAL #4   Title Overall pain reduction to 60% with home and work ADLs   Time 8   Period Weeks   Status New     PT LONG TERM GOAL #5   Title FOTO functional outcome score improved from 41% limitation to 34% indicating improved function with less pain   Time 8   Period Weeks   Status New               Plan - 07/15/16 1823    Clinical Impression Statement The patient is referred for "chronic bilateral back pain, unspecified back location" and "malaise and fatigue" but her primary complaint is neck and headache pain.  She has a long history of neck pain from trauma during dental procedure 7 years ago but with a recent exacerbation 1 week ago.  She feels unable to manage her symptoms with her home TENS unit as previous.  Denies recent injury but states she has increased her work load recently which may be a factor.  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  She states she sits in a "waiting room style chair".  Sitting does seem to aggravate her symptoms.  Mild head foward, rounded shoulders, increased thoracic kyphosis.  Tender points noted in suboccipitals which most likely contribute to her headaches.  Tender points in upper traps, levators, rhomboids, SCM as well.  Decreased cervical AROM in all planes.  Lumbar AROM WFLs and painless today.  Decreased deep cervical flexor and periscapular strength grossly 4-/5.  Decreased pain with cervical distraction.  The patient expresses being fearful of movement although she previously did deep water ex 2x/week  and walked 30-60 minutes.  She would like to know what her limitations are and a basic HEP for self management.  She would also benefit from ergonomic changes to her work set up including ability to stand for documentation  and a supportive chair.  She is of low complexity evaluation due to evolving status of condition and fibromylagia but good supportive home environment.   Rehab Potential Good   Clinical Impairments Affecting Rehab Potential fibromyalgia (low reps/low intensity);  has home TENS   PT Frequency 2x / week   PT Duration 8 weeks   PT Treatment/Interventions ADLs/Self Care Home Management;Cryotherapy;Electrical Stimulation;Traction;Moist Heat;Ultrasound;Patient/family education;Therapeutic exercise;Manual techniques;Taping;Dry needling   PT Next Visit Plan write something for work purposes with recommendations for a work chair and stand up desk;  see if questions regarding pain neuroscience videos that were emailed;  review supine cervical retractions and add seated cervical retractions and scapular retractions;  add supine decompression ex's;  manual therapy;  dry needling upper traps, levator, rhomboids and suboccipitals   Recommended Other Services patient would benefit from a good work chair with spinal support and an adjustable desk that could elevate to work in a standing position      Patient will benefit from skilled therapeutic intervention in order to improve the following deficits and impairments:  Pain, Decreased strength, Increased muscle spasms, Decreased range of motion, Increased fascial restricitons, Postural dysfunction  Visit Diagnosis: Cervicalgia  Chronic bilateral low back pain without sciatica  Muscle weakness (generalized)  Cramp and spasm     Problem List Patient Active Problem List   Diagnosis Date Noted  . Chronic back pain 07/13/2016  . Allergic rhinitis 03/09/2016  . Headache(784.0) 03/25/2014  . Nausea and vomiting  03/25/2014  . Essential hypertension 02/18/2014  . Encounter for therapeutic drug monitoring 04/26/2013  . Chronic pain syndrome 02/22/2013  . Chronic insomnia 02/22/2013  . Chronic fatigue syndrome 02/22/2013  . Shortness of breath 11/29/2012   . Paresthesias 10/25/2012  . Muscle weakness 10/25/2012  . Malaise and fatigue 10/18/2012  . Spasm of muscle 10/18/2012  . Disturbance of skin sensation 10/18/2012  . Hyperlipidemia 09/18/2012  . Hypokalemia 09/18/2012  . Neuropathy ([place]) 09/18/2012  . Chest pain 09/17/2012  . CONSTIPATION 10/23/2008   Ruben Im, PT 07/15/16 10:32 PM Phone: [phone] Fax: [phone]  Alvera Singh 07/15/2016, 10:31 PM  [facility] [address], [place], [place], [address] Phone: [phone]   Fax:  [phone]  Name: Tammy [name] MRN: [id number] Date of Birth: 1961/02/10

## 2016-07-15 NOTE — Patient Instructions (Addendum)
Posture Tips DO: - stand tall and erect - keep chin tucked in - keep head and shoulders in alignment - check posture regularly in mirror or large window - pull head back against headrest in car seat;  Change your position often.  Sit with lumbar support. DON'T: - slouch or slump while watching TV or reading - sit, stand or lie in one position  for too long;  Sitting is especially hard on the spine so if you sit at a desk/use the computer, then stand up often!   Copyright  VHI. All rights reserved.  Posture - Standing   Good posture is important. Avoid slouching and forward head thrust. Maintain curve in low back and align ears over shoul- ders, hips over ankles.  Pull your belly button in toward your back bone.   Copyright  VHI. All rights reserved.  Posture - Sitting   Sit upright, head facing forward. Try using a roll to support lower back. Keep shoulders relaxed, and avoid rounded back. Keep hips level with knees. Avoid crossing legs for long periods.    Extensors, Supine   Lie supine, head on small, rolled towel. Gently tuck chin and bring toward chest. Hold _3__ seconds. Repeat __8_ times per session. Do __2_ sessions per day. [facility] [address] [place], [place] [address] Phone # [phone] Fax [phone]

## 2016-07-19 MED FILL — VYVANSE 20 MG CAPSULE: 20 | 30 days supply | Qty: 30 | Fill #0

## 2016-07-20 ENCOUNTER — Ambulatory Visit: Payer: 59 | Admitting: Physical Therapy

## 2016-07-20 DIAGNOSIS — M6281 Muscle weakness (generalized): Secondary | ICD-10-CM

## 2016-07-20 DIAGNOSIS — R252 Cramp and spasm: Secondary | ICD-10-CM | POA: Diagnosis not present

## 2016-07-20 DIAGNOSIS — M542 Cervicalgia: Secondary | ICD-10-CM | POA: Diagnosis not present

## 2016-07-20 DIAGNOSIS — M545 Low back pain, unspecified: Secondary | ICD-10-CM

## 2016-07-20 DIAGNOSIS — G8929 Other chronic pain: Secondary | ICD-10-CM | POA: Diagnosis not present

## 2016-07-20 NOTE — Therapy (Signed)
[facility] [address], [place], [place], [address] Phone: [phone]   Fax:  [phone]  Physical Therapy Treatment  Patient Details  Name: Tammy [name] MRN: [id number] Date of Birth: [birth year] Referring Provider: Dr. Martinique  Encounter Date: 07/20/2016      PT End of Session - 07/20/16 1117    Visit Number 2   Date for PT Re-Evaluation 09/09/16   Authorization Type UMR   PT Start Time 1021   PT Stop Time 1115   PT Time Calculation (min) 54 min   Activity Tolerance Patient tolerated treatment well      Past Medical History:  Diagnosis Date  . Arthritis   . Chronic fatigue syndrome   . Hypertension   . Neuropathy [facility])     Past Surgical History:  Procedure Laterality Date  . CARDIAC CATHETERIZATION  09/18/2012  . LEFT HEART CATHETERIZATION WITH CORONARY ANGIOGRAM N/A 09/18/2012   Procedure: LEFT HEART CATHETERIZATION WITH CORONARY ANGIOGRAM;  Surgeon: Sherren Mocha, MD;  Location: [facility] CATH LAB;  Service: Cardiovascular;  Laterality: N/A;  . TONSILLECTOMY  1965    There were no vitals filed for this visit.      Subjective Assessment - 07/20/16 1022    Subjective Received pain neuroscience videos and they were helpful.  Patient brought MRI from 2012   Currently in Pain? Yes   Pain Score 5    Pain Location Neck   Pain Orientation Right;Left   Multiple Pain Sites Yes   Pain Score 5   Pain Location Back   Pain Frequency Constant                         [facility] Adult PT Treatment/Exercise - 07/20/16 0001      Self-Care   Self-Care Other Self-Care Comments  review of neuroscience of pain;  discussion of MRI findings    Other Self-Care Comments  edu on standing desks, posture with work related tasks     Exercises   Exercises Neck  see education for exercises in handout     Neck Exercises: Supine   Other Supine Exercise decompression series per home ex handout     Manual  Therapy   Manual Therapy Soft tissue mobilization;Myofascial release   Soft tissue mobilization uppper traps bilaterally, cervical paraspinals   Myofascial Release suboccipital release 3 min   Manual Traction cervical traction                 PT Education - 07/20/16 1055    Education provided Yes   Education Details decompression series ;  extensive discussion on posture and workplace ergonomics   Person(s) Educated Patient   Methods Explanation;Demonstration;Handout   Comprehension Verbalized understanding;Returned demonstration          PT Short Term Goals - 07/20/16 1124      PT SHORT TERM GOAL #1   Title The patient will demonstrate understanding of postural awareness and basic self care strategies and exercise  08/12/16   Time 4   Period Weeks   Status On-going     PT SHORT TERM GOAL #2   Title The patient will report a 30% reduction in spinal pain (neck and/or back)   Time 4   Period Weeks   Status On-going     PT SHORT TERM GOAL #3   Title Cervical rotation improved to 30 degrees needed for driving   Time 4   Status On-going  PT Long Term Goals - 07/20/16 1126      PT LONG TERM GOAL #1   Title The patient will be independent in safe self progression of HEP and self care strategies including work place ergonomics  09/09/16   Time 8   Period Weeks   Status On-going     PT LONG TERM GOAL #2   Title The patient will have improved cervical flexion to 35 degrees, extension to 45 degrees and left sidebending to 25 degrees needed for ADLs including driving   Time 8   Period Weeks   Status On-going     PT LONG TERM GOAL #3   Title The patient will have deep cervical muscle strength and periscapular strength to at least 4/5 needed for endurance for sitting at work longer periods of time   Time 8   Period Weeks   Status On-going     PT LONG TERM GOAL #4   Title Overall pain reduction to 60% with home and work ADLs   Time 8   Period Weeks    Status On-going     PT LONG TERM GOAL #5   Title FOTO functional outcome score improved from 41% limitation to 34% indicating improved function with less pain   Time 8   Period Weeks   Status On-going               Plan - 07/20/16 1117    Clinical Impression Statement Extensive discussion on the neuroscience of pain.  Reviewed MRI reports from 2012 and discussed findings related to pain.  The patient is receptive to education.  Emailed recommendation for work site recommendations per patient request.  Good relief with manual therapy.  May be a candidate for dry needling in the future although patient is fearful at this point.  Therapist closely monitoring response with all interventions.     PT Next Visit Plan supine scapular series with band;  cervical isometrics and initiate abdominal brace;  manual therapy;  possible dry needling upper traps, rhomboids and upper traps.      Patient will benefit from skilled therapeutic intervention in order to improve the following deficits and impairments:     Visit Diagnosis: Cervicalgia  Chronic bilateral low back pain without sciatica  Cramp and spasm  Muscle weakness (generalized)     Problem List Patient Active Problem List   Diagnosis Date Noted  . Chronic back pain 07/13/2016  . Allergic rhinitis 03/09/2016  . Headache(784.0) 03/25/2014  . Nausea and vomiting 03/25/2014  . Essential hypertension 02/18/2014  . Encounter for therapeutic drug monitoring 04/26/2013  . Chronic pain syndrome 02/22/2013  . Chronic insomnia 02/22/2013  . Chronic fatigue syndrome 02/22/2013  . Shortness of breath 11/29/2012  . Paresthesias 10/25/2012  . Muscle weakness 10/25/2012  . Malaise and fatigue 10/18/2012  . Spasm of muscle 10/18/2012  . Disturbance of skin sensation 10/18/2012  . Hyperlipidemia 09/18/2012  . Hypokalemia 09/18/2012  . Neuropathy ([place]) 09/18/2012  . Chest pain 09/17/2012  . CONSTIPATION 10/23/2008   Ruben Im, PT 07/20/16 8:35 PM Phone: [phone] Fax: [phone] Alvera Singh 07/20/2016, 8:35 PM  [facility] [address], [place], [place], [address] Phone: [phone]   Fax:  [phone]  Name: Tammy [name] MRN: [id number] Date of Birth: [birth year]

## 2016-07-20 NOTE — Patient Instructions (Addendum)
RE-ALIGNMENT ROUTINE EXERCISES BASIC FOR POSTURAL CORRECTION   RE-ALIGNMENT Tips BENEFITS: 1.It helps to re-align the curves of the back and improve standing posture. 2.It allows the back muscles to rest and strengthen in preparation for more activity. FREQUENCY: Daily, even after weeks, months and years of more advanced exercises. START: 1.All exercises start in the same position: lying on the back, arms resting on the supporting surface, palms up and slightly away from the body, backs of hands down, knees bent, feet flat. 2.The head, neck, arms, and legs are supported according to specific instructions of your therapist. Copyright  VHI. All rights reserved.    1. Decompression Exercise: Basic.   Takes compression off the vertebral bodies; increases tolerance for lying on the back; helps relieve back pain   Lie on back on firm surface, knees bent, feet flat, arms turned up, out to sides (~35 degrees). Head neck and arms supported as necessary. Time _5-15__ minutes. Surface: floor     2. Shoulder Press  Strengthens upper back extensors and scapular retractors.   Press both shoulders down. Hold _2-3__ seconds. Repeat _3-5__ times. Surface: floor        3. Head Press With [place]  Strengthens neck extensors   Tuck chin SLIGHTLY toward chest, keep mouth closed. Feel weight on back of head. Increase weight by pressing head down. Hold _2-3__ seconds. Relax. Repeat 3-5___ times. Surface: floor   4. Leg Lengthener: stretches quadratus lumborum and hip flexors.  Strengthens quads and ankle dorsiflexors.      5.  Press whole leg downward "into the sand"  5x     Ruben Im PT [facility] [address] [place], [place] [address] Phone # [phone] Fax [phone]      Posture Tips DO: - stand tall and erect - keep chin tucked in - keep head and shoulders in alignment - check posture regularly in mirror or large window - pull head back  against headrest in car seat;  Change your position often.  Sit with lumbar support. DON'T: - slouch or slump while watching TV or reading - sit, stand or lie in one position  for too long;  Sitting is especially hard on the spine so if you sit at a desk/use the computer, then stand up often!   Copyright  VHI. All rights reserved.  Posture - Standing   Good posture is important. Avoid slouching and forward head thrust. Maintain curve in low back and align ears over shoul- ders, hips over ankles.  Pull your belly button in toward your back bone.   Copyright  VHI. All rights reserved.  Posture - Sitting   Sit upright, head facing forward. Try using a roll to support lower back. Keep shoulders relaxed, and avoid rounded back. Keep hips level with knees. Avoid crossing legs for long periods.   Copyright  VHI. All rights reserved.

## 2016-07-29 ENCOUNTER — Ambulatory Visit: Payer: 59 | Attending: Family Medicine | Admitting: Physical Therapy

## 2016-07-29 DIAGNOSIS — R252 Cramp and spasm: Secondary | ICD-10-CM | POA: Diagnosis not present

## 2016-07-29 DIAGNOSIS — M542 Cervicalgia: Secondary | ICD-10-CM | POA: Diagnosis not present

## 2016-07-29 DIAGNOSIS — G8929 Other chronic pain: Secondary | ICD-10-CM | POA: Diagnosis not present

## 2016-07-29 DIAGNOSIS — M6281 Muscle weakness (generalized): Secondary | ICD-10-CM | POA: Diagnosis not present

## 2016-07-29 DIAGNOSIS — M545 Low back pain: Secondary | ICD-10-CM | POA: Insufficient documentation

## 2016-07-29 NOTE — Patient Instructions (Signed)
   Trigger Point Dry Needling  . What is Trigger Point Dry Needling (DN)? o DN is a physical therapy technique used to treat muscle pain and dysfunction. Specifically, DN helps deactivate muscle trigger points (muscle knots).  o A thin filiform needle is used to penetrate the skin and stimulate the underlying trigger point. The goal is for a local twitch response (LTR) to occur and for the trigger point to relax. No medication of any kind is injected during the procedure.   . What Does Trigger Point Dry Needling Feel Like?  o The procedure feels different for each individual patient. Some patients report that they do not actually feel the needle enter the skin and overall the process is not painful. Very mild bleeding may occur. However, many patients feel a deep cramping in the muscle in which the needle was inserted. This is the local twitch response.   Marland Kitchen How Will I feel after the treatment? o Soreness is normal, and the onset of soreness may not occur for a few hours. Typically this soreness does not last longer than two days.  o Bruising is uncommon, however; ice can be used to decrease any possible bruising.  o In rare cases feeling tired or nauseous after the treatment is normal. In addition, your symptoms may get worse before they get better, this period will typically not last longer than 24 hours.   . What Can I do After My Treatment? o Increase your hydration by drinking more water for the next 24 hours. o You may place ice or heat on the areas treated that have become sore, however, do not use heat on inflamed or bruised areas. Heat often brings more relief post needling. o You can continue your regular activities, but vigorous activity is not recommended initially after the treatment for 24 hours. o DN is best combined with other physical therapy such as strengthening, stretching, and other therapies.    Ruben Im PT [facility] [address], [place], [place] [address] Phone # [phone] Fax [phone]

## 2016-07-29 NOTE — Therapy (Signed)
[facility] [address] [place], [place], [address] Phone: [phone]   Fax:  [phone]  Physical Therapy Treatment  Patient Details  Name: Tammy [name] MRN: [id number] Date of Birth: [birth year] Referring Provider: Dr. Martinique  Encounter Date: 07/29/2016      PT End of Session - 07/29/16 0807    Visit Number 3   Date for PT Re-Evaluation 09/09/16   Authorization Type UMR   PT Start Time 0727   PT Stop Time 0812   PT Time Calculation (min) 45 min   Activity Tolerance Patient tolerated treatment well      Past Medical History:  Diagnosis Date  . Arthritis   . Chronic fatigue syndrome   . Hypertension   . Neuropathy [facility])     Past Surgical History:  Procedure Laterality Date  . CARDIAC CATHETERIZATION  09/18/2012  . LEFT HEART CATHETERIZATION WITH CORONARY ANGIOGRAM N/A 09/18/2012   Procedure: LEFT HEART CATHETERIZATION WITH CORONARY ANGIOGRAM;  Surgeon: Sherren Mocha, MD;  Location: [facility] CATH LAB;  Service: Cardiovascular;  Laterality: N/A;  . TONSILLECTOMY  1965    There were no vitals filed for this visit.      Subjective Assessment - 07/29/16 0727    Subjective Going pretty well but a lot of tightness in bilateral upper trap regions.  I'm ready to accept the dry needling.  I've even had pain referred into my head and  left jaw.     Currently in Pain? Yes   Pain Score 6    Pain Location Neck   Pain Orientation Right;Left   Pain Type Chronic pain                         [facility] Adult PT Treatment/Exercise - 07/29/16 0001      Self-Care   Other Self-Care Comments  edu on standing desks, posture with work related tasks;  Self care following dry needling including use of moist heat, stretching, change of position and her home TENS     Moist Heat Therapy   Number Minutes Moist Heat 7 Minutes   Moist Heat Location Cervical     Manual Therapy   Soft tissue mobilization uppper  traps bilaterally, cervical paraspinals   Myofascial Release suboccipital release 3 min   Manual Traction cervical traction           Trigger Point Dry Needling - 07/29/16 0806    Consent Given? Yes   Education Handout Provided Yes   Muscles Treated Upper Body Upper trapezius;Suboccipitals muscle group  bilateral cervical multifidi   Upper Trapezius Response Twitch reponse elicited;Palpable increased muscle length   SubOccipitals Response Twitch response elicited;Palpable increased muscle length              PT Education - 07/29/16 0803    Education provided Yes   Education Details posture education and HEP review;  dry needling after care   Person(s) Educated Patient   Methods Explanation;Demonstration;Handout   Comprehension Verbalized understanding;Returned demonstration          PT Short Term Goals - 07/29/16 1342      PT SHORT TERM GOAL #1   Title The patient will demonstrate understanding of postural awareness and basic self care strategies and exercise  08/12/16   Time 4   Period Weeks   Status On-going     PT SHORT TERM GOAL #2   Title The patient will report a 30% reduction in spinal  pain (neck and/or back)   Time 4   Period Weeks   Status On-going     PT SHORT TERM GOAL #3   Title Cervical rotation improved to 30 degrees needed for driving   Time 4   Period Weeks   Status On-going           PT Long Term Goals - 07/29/16 1342      PT LONG TERM GOAL #1   Title The patient will be independent in safe self progression of HEP and self care strategies including work place ergonomics  09/09/16   Time 8   Period Weeks   Status On-going     PT LONG TERM GOAL #2   Title The patient will have improved cervical flexion to 35 degrees, extension to 45 degrees and left sidebending to 25 degrees needed for ADLs including driving   Time 8   Period Weeks   Status On-going     PT LONG TERM GOAL #3   Title The patient will have deep cervical muscle  strength and periscapular strength to at least 4/5 needed for endurance for sitting at work longer periods of time   Time 8   Period Weeks   Status On-going     PT LONG TERM GOAL #4   Title Overall pain reduction to 60% with home and work ADLs   Time 8   Period Weeks   Status On-going     PT LONG TERM GOAL #5   Title FOTO functional outcome score improved from 41% limitation to 34% indicating improved function with less pain   Time 8   Period Weeks   Status On-going               Plan - 07/29/16 1337    Clinical Impression Statement The patient has multiple tender points and she is receptive to dry needling secondary to minimal long term relief with other interventions.  Multiple twitch responses produced which is a good prognostic indicator.  Improved muscle length following.  Patient continues to be receptive to making ergonomic changes to her work set up.  She should meet all STGs in next 2 weeks.  Therapist closely monitoring response with all.     PT Next Visit Plan assess response to DN #1;  supine scapular series with band;  cervical isometrics and initiate abdominal brace;  manual therapy; recheck cervical AROM      Patient will benefit from skilled therapeutic intervention in order to improve the following deficits and impairments:     Visit Diagnosis: Cervicalgia  Chronic bilateral low back pain without sciatica  Cramp and spasm  Muscle weakness (generalized)     Problem List Patient Active Problem List   Diagnosis Date Noted  . Chronic back pain 07/13/2016  . Allergic rhinitis 03/09/2016  . Headache(784.0) 03/25/2014  . Nausea and vomiting 03/25/2014  . Essential hypertension 02/18/2014  . Encounter for therapeutic drug monitoring 04/26/2013  . Chronic pain syndrome 02/22/2013  . Chronic insomnia 02/22/2013  . Chronic fatigue syndrome 02/22/2013  . Shortness of breath 11/29/2012  . Paresthesias 10/25/2012  . Muscle weakness 10/25/2012  . Malaise  and fatigue 10/18/2012  . Spasm of muscle 10/18/2012  . Disturbance of skin sensation 10/18/2012  . Hyperlipidemia 09/18/2012  . Hypokalemia 09/18/2012  . Neuropathy ([place]) 09/18/2012  . Chest pain 09/17/2012  . CONSTIPATION 10/23/2008   Ruben Im, PT 07/29/16 1:46 PM Phone: [phone] Fax: [phone] Alvera Singh 07/29/2016, 1:45 PM  [facility] [address] [place], [place], [address] Phone: [phone]   Fax:  [phone]  Name: ATLEAN LAUERSDORF MRN: [id number] Date of Birth: December 31, 1960

## 2016-08-02 MED FILL — DULoxetine HCL 30 MG CPEP: 30 | 90 days supply | Qty: 180 | Fill #1

## 2016-08-02 MED FILL — ZOLPIDEM TARTRATE 10 MG TAB: 10 | 60 days supply | Qty: 30 | Fill #0

## 2016-08-03 ENCOUNTER — Ambulatory Visit: Payer: 59 | Admitting: Physical Therapy

## 2016-08-03 DIAGNOSIS — M545 Low back pain: Secondary | ICD-10-CM

## 2016-08-03 DIAGNOSIS — M6281 Muscle weakness (generalized): Secondary | ICD-10-CM | POA: Diagnosis not present

## 2016-08-03 DIAGNOSIS — G8929 Other chronic pain: Secondary | ICD-10-CM

## 2016-08-03 DIAGNOSIS — M542 Cervicalgia: Secondary | ICD-10-CM

## 2016-08-03 DIAGNOSIS — R252 Cramp and spasm: Secondary | ICD-10-CM

## 2016-08-03 NOTE — Patient Instructions (Signed)
Over Head Pull: Narrow Grip       On back, knees bent, feet flat, band across thighs, elbows straight but relaxed. Pull hands apart (start). Keeping elbows straight, bring arms up and over head, hands toward floor. Keep pull steady on band. Hold momentarily. Return slowly, keeping pull steady, back to start. Repeat __8_ times. Band color ____red__   Side Pull: Double Arm   On back, knees bent, feet flat. Arms perpendicular to body, shoulder level, elbows straight but relaxed. Pull arms out to sides, elbows straight. Resistance band comes across collarbones, hands toward floor. Hold momentarily. Slowly return to starting position. Repeat _8__ times. Band color _red____   Elmer Picker   On back, knees bent, feet flat, left hand on left hip, right hand above left. Pull right arm DIAGONALLY (hip to shoulder) across chest. Bring right arm along head toward floor. Hold momentarily. Slowly return to starting position. Repeat __8_ times. Do with left arm. Band color __red____   Shoulder Rotation: Double Arm   On back, knees bent, feet flat, elbows tucked at sides, bent 90, hands palms up. Pull hands apart and down toward floor, keeping elbows near sides. Hold momentarily. Slowly return to starting position. Repeat _8__ times. Band color red____    [facility] [address] [place], [place] [address] Phone # [phone] Fax [phone]

## 2016-08-03 NOTE — Therapy (Signed)
[facility] [address] [place], [place], [address] Phone: [phone]   Fax:  [phone]  Physical Therapy Treatment  Patient Details  Name: Tammy [name] MRN: [id number] Date of Birth: 1960-10-14 Referring Provider: Dr. Martinique  Encounter Date: 08/03/2016      PT End of Session - 08/03/16 0744    Visit Number 4   Date for PT Re-Evaluation 09/09/16   Authorization Type UMR   PT Start Time 0730   PT Stop Time 0813   PT Time Calculation (min) 43 min   Activity Tolerance Patient tolerated treatment well      Past Medical History:  Diagnosis Date  . Arthritis   . Chronic fatigue syndrome   . Hypertension   . Neuropathy [name])     Past Surgical History:  Procedure Laterality Date  . CARDIAC CATHETERIZATION  09/18/2012  . LEFT HEART CATHETERIZATION WITH CORONARY ANGIOGRAM N/A 09/18/2012   Procedure: LEFT HEART CATHETERIZATION WITH CORONARY ANGIOGRAM;  Surgeon: Sherren Mocha, MD;  Location: [facility] CATH LAB;  Service: Cardiovascular;  Laterality: N/A;  . TONSILLECTOMY  1965    There were no vitals filed for this visit.      Subjective Assessment - 08/03/16 0732    Subjective I'm a dry needling believer!  I have better ROM now.  Minor soreness but not too bad.  Upper neck discomfort.     Currently in Pain? Yes   Pain Score 4    Pain Location Neck   Pain Orientation Right;Left   Pain Type Chronic pain                         [facility] Adult PT Treatment/Exercise - 08/03/16 0001      Self-Care   Other Self-Care Comments  education on cervical roll use for sleeping     Neck Exercises: Supine   Other Supine Exercise red band scapular stabilization 5-8x each per HEP     Moist Heat Therapy   Number Minutes Moist Heat 5 Minutes   Moist Heat Location Cervical     Manual Therapy   Soft tissue mobilization uppper traps bilaterally, cervical paraspinals; suboccipitals           Trigger  Point Dry Needling - 08/03/16 0745    Consent Given? Yes   Muscles Treated Upper Body Upper trapezius;Suboccipitals muscle group  bilateral cervical multifidi      Performed bilaterally.        PT Education - 08/03/16 0744    Education provided Yes   Education Details supine scapular stabilization red band   Person(s) Educated Patient   Methods Explanation;Demonstration;Handout   Comprehension Verbalized understanding;Returned demonstration          PT Short Term Goals - 08/03/16 1321      PT SHORT TERM GOAL #1   Title The patient will demonstrate understanding of postural awareness and basic self care strategies and exercise  08/12/16   Status Achieved     PT SHORT TERM GOAL #2   Title The patient will report a 30% reduction in spinal pain (neck and/or back)   Time 4   Period Weeks   Status On-going     PT SHORT TERM GOAL #3   Title Cervical rotation improved to 30 degrees needed for driving   Time 4   Period Weeks   Status On-going           PT Long Term Goals - 08/03/16  [place] #1   Title The patient will be independent in safe self progression of HEP and self care strategies including work place ergonomics  09/09/16   Time 8   Period Weeks   Status On-going     PT LONG TERM GOAL #2   Title The patient will have improved cervical flexion to 35 degrees, extension to 45 degrees and left sidebending to 25 degrees needed for ADLs including driving   Time 8   Period Weeks   Status On-going     PT LONG TERM GOAL #3   Title The patient will have deep cervical muscle strength and periscapular strength to at least 4/5 needed for endurance for sitting at work longer periods of time   Time 8   Period Weeks   Status On-going     PT LONG TERM GOAL #4   Title Overall pain reduction to 60% with home and work ADLs   Time 8   Period Weeks   Status On-going     PT LONG TERM GOAL #5   Title FOTO functional outcome score improved from 41%  limitation to 34% indicating improved function with less pain   Time 8   Period Weeks   Status On-going               Plan - 08/03/16 1316    Clinical Impression Statement The patient reports good relief from initiation of dry needling last visit with improved soft tissue noted.  She still has multiple tender points in upper traps from overuse and reaction to stress.  She is able to perform supine scapular band series without pain exacerbation but she needs verbal cues to avoid upper trap compensation.  Low repetitions secondary to fibromyalgia.  Patient receptive to education on use of cervical roll for sleeping.  Therapist closely monitoring response with all interventions.     PT Next Visit Plan assess response to DN#1 and use of cervical roll for sleeping;  add abdominal brace;  add prone scapular strengthening;  recheck cervical AROM and % improvement for STG      Patient will benefit from skilled therapeutic intervention in order to improve the following deficits and impairments:     Visit Diagnosis: Cervicalgia  Chronic bilateral low back pain without sciatica  Cramp and spasm  Muscle weakness (generalized)     Problem List Patient Active Problem List   Diagnosis Date Noted  . Chronic back pain 07/13/2016  . Allergic rhinitis 03/09/2016  . Headache(784.0) 03/25/2014  . Nausea and vomiting 03/25/2014  . Essential hypertension 02/18/2014  . Encounter for therapeutic drug monitoring 04/26/2013  . Chronic pain syndrome 02/22/2013  . Chronic insomnia 02/22/2013  . Chronic fatigue syndrome 02/22/2013  . Shortness of breath 11/29/2012  . Paresthesias 10/25/2012  . Muscle weakness 10/25/2012  . Malaise and fatigue 10/18/2012  . Spasm of muscle 10/18/2012  . Disturbance of skin sensation 10/18/2012  . Hyperlipidemia 09/18/2012  . Hypokalemia 09/18/2012  . Neuropathy ([place]) 09/18/2012  . Chest pain 09/17/2012  . CONSTIPATION 10/23/2008   Ruben Im,  PT 08/03/16 1:27 PM Phone: [phone] Fax: [phone]  Alvera Singh 08/03/2016, Abelina Bachelor PM  [facility] [address], [place], [place], [address] Phone: [phone]   Fax:  [phone]  Name: Tammy [name] MRN: [id number] Date of Birth: 1961/01/14

## 2016-08-10 ENCOUNTER — Ambulatory Visit: Payer: 59 | Admitting: Physical Therapy

## 2016-08-10 DIAGNOSIS — G8929 Other chronic pain: Secondary | ICD-10-CM

## 2016-08-10 DIAGNOSIS — M545 Low back pain: Secondary | ICD-10-CM

## 2016-08-10 DIAGNOSIS — M542 Cervicalgia: Secondary | ICD-10-CM

## 2016-08-10 DIAGNOSIS — R252 Cramp and spasm: Secondary | ICD-10-CM

## 2016-08-10 DIAGNOSIS — M6281 Muscle weakness (generalized): Secondary | ICD-10-CM | POA: Diagnosis not present

## 2016-08-10 NOTE — Patient Instructions (Signed)
[facility] [address], [address] , [place] [address] Phone # [phone] Fax [phone]    

## 2016-08-10 NOTE — Therapy (Signed)
[facility] [address] [place], [place], [address] Phone: [phone]   Fax:  [phone]  Physical Therapy Treatment  Patient Details  Name: Tammy [name] MRN: [id number] Date of Birth: 1961/06/03 Referring Provider: Dr. Martinique  Encounter Date: 08/10/2016      PT End of Session - 08/10/16 0825    Visit Number 5   Date for PT Re-Evaluation 09/09/16   Authorization Type UMR   PT Start Time 0800   PT Stop Time 0850   PT Time Calculation (min) 50 min   Activity Tolerance Patient tolerated treatment well      Past Medical History:  Diagnosis Date  . Arthritis   . Chronic fatigue syndrome   . Hypertension   . Neuropathy [facility])     Past Surgical History:  Procedure Laterality Date  . CARDIAC CATHETERIZATION  09/18/2012  . LEFT HEART CATHETERIZATION WITH CORONARY ANGIOGRAM N/A 09/18/2012   Procedure: LEFT HEART CATHETERIZATION WITH CORONARY ANGIOGRAM;  Surgeon: Sherren Mocha, MD;  Location: [facility] CATH LAB;  Service: Cardiovascular;  Laterality: N/A;  . TONSILLECTOMY  1965    There were no vitals filed for this visit.      Subjective Assessment - 08/10/16 0758    Subjective I'm OK.  I do think the dry needling is helping.  It helps with ROM.  I don't get as tight as the day goes on.  I can tell I need more of it though.  I don't have improved ergonomics.  I am using a laptop instead of an ipad.  No excessive soreness.  I have not been good about doing the exercises.  I'm sleeping better using the towel roll.  Not waking up with headaches.     Currently in Pain? Yes   Pain Score 5    Pain Location Neck   Pain Orientation Right   Pain Type Chronic pain            [facility] PT Assessment - 08/10/16 0001      AROM   Cervical Flexion 30   Cervical Extension 55   Cervical - Right Side Bend 33   Cervical - Left Side Bend 30   Cervical - Right Rotation 40   Cervical - Left Rotation 38   Lumbar Extension 15    Lumbar - Right Side Bend 35   Lumbar - Left Side Bend 35                     [facility] Adult PT Treatment/Exercise - 08/10/16 0001      Self-Care   Other Self-Care Comments  extensive discussion on chronic pain and return to function;  patient has goal to return to vacumning     Neck Exercises: Supine   Other Supine Exercise red band scapular stabilization 5-8x each per HEP     Lumbar Exercises: Supine   Ab Set 10 reps   Bent Knee Raise --   Isometric Hip Flexion --     Moist Heat Therapy   Number Minutes Moist Heat 5 Minutes   Moist Heat Location Cervical     Manual Therapy   Soft tissue mobilization uppper traps bilaterally, cervical paraspinals; suboccipitals           Trigger Point Dry Needling - 08/10/16 1239    Consent Given? Yes   Muscles Treated Upper Body --  bilateral cervical multifid   Upper Trapezius Response Twitch reponse elicited;Palpable increased muscle length   SubOccipitals  Response Twitch response elicited;Palpable increased muscle length              PT Education - 08/10/16 0824    Education provided Yes   Education Details supine abdominal brace series   Person(s) Educated Patient   Methods Explanation;Demonstration;Handout   Comprehension Verbalized understanding;Returned demonstration          PT Short Term Goals - 08/10/16 1256      PT SHORT TERM GOAL #1   Title The patient will demonstrate understanding of postural awareness and basic self care strategies and exercise  08/12/16   Status Achieved     PT SHORT TERM GOAL #2   Title The patient will report a 30% reduction in spinal pain (neck and/or back)   Status Achieved     PT SHORT TERM GOAL #3   Title Cervical rotation improved to 30 degrees needed for driving   Status Achieved           PT Long Term Goals - 08/10/16 1257      PT LONG TERM GOAL #1   Title The patient will be independent in safe self progression of HEP and self care strategies including  work place ergonomics  09/09/16   Time 8   Period Weeks   Status On-going     PT LONG TERM GOAL #2   Title The patient will have improved cervical flexion to 35 degrees, extension to 45 degrees and left sidebending to 25 degrees needed for ADLs including driving   Time 8   Period Weeks   Status On-going     PT LONG TERM GOAL #3   Title The patient will have deep cervical muscle strength and periscapular strength to at least 4/5 needed for endurance for sitting at work longer periods of time   Time 8   Period Weeks   Status On-going     PT LONG TERM GOAL #4   Title Overall pain reduction to 60% with home and work ADLs   Time 8   Period Weeks   Status On-going     PT LONG TERM GOAL #5   Title FOTO functional outcome score improved from 41% limitation to 34% indicating improved function with less pain   Time 8   Period Weeks   Status On-going               Plan - 08/10/16 0825    Clinical Impression Statement Overall 30% better per patient report.  Significant improvement in cervical ROM in all planes.  All STGs met.  She reports she would like to be able to vacumn her house but she is fearful.  Continued neuroscience of pain education using techniques to promote calming of brain pain sensitization.  Discussed task modification of vacumning including 1 room only and proper body mechanics.   Therapist closely monitoring response with all treatment interventions.     PT Next Visit Plan assess response to DN#3;  review abdominal brace;  add prone scapular strengthening ;  low level standing core with theraband      Patient will benefit from skilled therapeutic intervention in order to improve the following deficits and impairments:     Visit Diagnosis: Cervicalgia  Chronic bilateral low back pain without sciatica  Cramp and spasm  Muscle weakness (generalized)     Problem List Patient Active Problem List   Diagnosis Date Noted  . Chronic back pain 07/13/2016  .  Allergic rhinitis 03/09/2016  . Headache(784.0) 03/25/2014  .  Nausea and vomiting 03/25/2014  . Essential hypertension 02/18/2014  . Encounter for therapeutic drug monitoring 04/26/2013  . Chronic pain syndrome 02/22/2013  . Chronic insomnia 02/22/2013  . Chronic fatigue syndrome 02/22/2013  . Shortness of breath 11/29/2012  . Paresthesias 10/25/2012  . Muscle weakness 10/25/2012  . Malaise and fatigue 10/18/2012  . Spasm of muscle 10/18/2012  . Disturbance of skin sensation 10/18/2012  . Hyperlipidemia 09/18/2012  . Hypokalemia 09/18/2012  . Neuropathy ([place]) 09/18/2012  . Chest pain 09/17/2012  . CONSTIPATION 10/23/2008   Ruben Im, PT 08/10/16 12:59 PM Phone: [phone] Fax: [phone]  Alvera Singh 08/10/2016, 12:59 PM  [facility] [address] [place], [place], [address] Phone: [phone]   Fax:  [phone]  Name: Tammy [name] MRN: [id number] Date of Birth: 1961-09-20

## 2016-08-17 ENCOUNTER — Ambulatory Visit: Payer: 59 | Admitting: Physical Therapy

## 2016-08-17 DIAGNOSIS — G8929 Other chronic pain: Secondary | ICD-10-CM

## 2016-08-17 DIAGNOSIS — M542 Cervicalgia: Secondary | ICD-10-CM

## 2016-08-17 DIAGNOSIS — R252 Cramp and spasm: Secondary | ICD-10-CM

## 2016-08-17 DIAGNOSIS — M545 Low back pain, unspecified: Secondary | ICD-10-CM

## 2016-08-17 DIAGNOSIS — M6281 Muscle weakness (generalized): Secondary | ICD-10-CM

## 2016-08-17 NOTE — Therapy (Signed)
[facility] [address] [place], [place], [address] Phone: [phone]   Fax:  [phone]  Physical Therapy Treatment  Patient Details  Name: Tammy [name] MRN: [id number] Date of Birth: Jan 16, 1961 Referring Provider: Dr. Martinique  Encounter Date: 08/17/2016      PT End of Session - 08/17/16 0858    Visit Number 6   Date for PT Re-Evaluation 09/09/16   Authorization Type UMR   PT Start Time 0800   PT Stop Time 0855   PT Time Calculation (min) 55 min   Activity Tolerance Patient tolerated treatment well      Past Medical History:  Diagnosis Date  . Arthritis   . Chronic fatigue syndrome   . Hypertension   . Neuropathy [facility])     Past Surgical History:  Procedure Laterality Date  . CARDIAC CATHETERIZATION  09/18/2012  . LEFT HEART CATHETERIZATION WITH CORONARY ANGIOGRAM N/A 09/18/2012   Procedure: LEFT HEART CATHETERIZATION WITH CORONARY ANGIOGRAM;  Surgeon: Sherren Mocha, MD;  Location: [facility] CATH LAB;  Service: Cardiovascular;  Laterality: N/A;  . TONSILLECTOMY  1965    There were no vitals filed for this visit.      Subjective Assessment - 08/17/16 0759    Subjective I'm feeling pretty rough.  7/10.    I don't know what I did last night, maybe slept wrong.  The cat was on my head.  Over the holiday I did several long walks and an exercise video but the last 2 nights were bad with pins/needles in legs and arms.  I did some stretching and wonders what may have exacerbated her symptoms.  Today headache, neck, lumbar spine.     Pertinent History fibromyalgia;  goes by "[name]"   Currently in Pain? Yes   Pain Score 7    Pain Location Neck   Pain Type Chronic pain   Multiple Pain Sites Yes   Pain Score 7   Pain Location Back   Pain Orientation Lower   Pain Type Chronic pain                         [facility] Adult PT Treatment/Exercise - 08/17/16 0001      Lumbar Exercises: Standing   Other Standing Lumbar Exercises red band rows 10x   Other Standing Lumbar Exercises red band pull downs 10x     Lumbar Exercises: Supine   Ab Set 10 reps   Bent Knee Raise 5 reps   Isometric Hip Flexion 10 reps     Moist Heat Therapy   Number Minutes Moist Heat 5 Minutes   Moist Heat Location Cervical;Lumbar Spine     Manual Therapy   Soft tissue mobilization uppper traps bilaterally, cervical paraspinals; suboccipitals    Myofascial Release bilateral lumbar paraspinals          Trigger Point Dry Needling - 08/17/16 0856    Consent Given? Yes   Muscles Treated Lower Body --  bilateral lumbar multifidi   Upper Trapezius Response Twitch reponse elicited;Palpable increased muscle length   SubOccipitals Response Twitch response elicited;Palpable increased muscle length              PT Education - 08/17/16 0857    Education provided Yes   Education Details red band rows, pulldowns;    patient took pics on her phone   Person(s) Educated Patient   Methods Explanation;Demonstration;Other (comment)   Comprehension Verbalized understanding  PT Short Term Goals - 08/17/16 1713      PT SHORT TERM GOAL #1   Title The patient will demonstrate understanding of postural awareness and basic self care strategies and exercise  08/12/16   Status Achieved     PT SHORT TERM GOAL #2   Title The patient will report a 30% reduction in spinal pain (neck and/or back)   Status Achieved     PT SHORT TERM GOAL #3   Title Cervical rotation improved to 30 degrees needed for driving   Status Achieved           PT Long Term Goals - 08/17/16 1713      PT LONG TERM GOAL #1   Title The patient will be independent in safe self progression of HEP and self care strategies including work place ergonomics  09/09/16   Time 8   Period Weeks   Status On-going     PT LONG TERM GOAL #2   Title The patient will have improved cervical flexion to 35 degrees, extension to 45 degrees  and left sidebending to 25 degrees needed for ADLs including driving   Time 8   Period Weeks   Status On-going     PT LONG TERM GOAL #3   Title The patient will have deep cervical muscle strength and periscapular strength to at least 4/5 needed for endurance for sitting at work longer periods of time   Time 8   Period Weeks   Status On-going     PT LONG TERM GOAL #4   Title Overall pain reduction to 60% with home and work ADLs   Time 8   Period Weeks   Status On-going     PT LONG TERM GOAL #5   Title FOTO functional outcome score improved from 41% limitation to 34% indicating improved function with less pain   Time 8   Period Weeks   Status On-going               Plan - 08/17/16 1707    Clinical Impression Statement The patient continues to be fearful of physical activity.  She had a recent setback after doing an exercise video at home which combined with 2 long walks, exacerbated her pain.  She reports frustration that her supervisor was not supportive in making ergonomic changes and she has been stressed about that and is tearful today.  Fewer tender points noted in cervical musculature.  Improved soft tissue length following dry needling and manual therapy.  Progress will be slower overall secondary to fearfulness and chronicity of pain.     PT Next Visit Plan assess response to DN#4 and added lumbar multifidi;  possible DN to gluteals and piriformis;   add prone scapular strengthening ;  review low level standing core with theraband      Patient will benefit from skilled therapeutic intervention in order to improve the following deficits and impairments:     Visit Diagnosis: Cervicalgia  Chronic bilateral low back pain without sciatica  Cramp and spasm  Muscle weakness (generalized)     Problem List Patient Active Problem List   Diagnosis Date Noted  . Chronic back pain 07/13/2016  . Allergic rhinitis 03/09/2016  . Headache(784.0) 03/25/2014  . Nausea and  vomiting 03/25/2014  . Essential hypertension 02/18/2014  . Encounter for therapeutic drug monitoring 04/26/2013  . Chronic pain syndrome 02/22/2013  . Chronic insomnia 02/22/2013  . Chronic fatigue syndrome 02/22/2013  . Shortness of breath 11/29/2012  .  Paresthesias 10/25/2012  . Muscle weakness 10/25/2012  . Malaise and fatigue 10/18/2012  . Spasm of muscle 10/18/2012  . Disturbance of skin sensation 10/18/2012  . Hyperlipidemia 09/18/2012  . Hypokalemia 09/18/2012  . Neuropathy ([place]) 09/18/2012  . Chest pain 09/17/2012  . CONSTIPATION 10/23/2008    Ruben Im, PT 08/17/16 5:16 PM Phone: [phone] Fax: [phone]  Alvera Singh 08/17/2016, 5:15 PM   [facility] [address], [place], [place], [address] Phone: [phone]   Fax:  [phone]  Name: Tammy [name] MRN: [id number] Date of Birth: August 06, 1961

## 2016-08-18 ENCOUNTER — Other Ambulatory Visit: Payer: Self-pay | Admitting: Family Medicine

## 2016-08-18 DIAGNOSIS — Z1231 Encounter for screening mammogram for malignant neoplasm of breast: Secondary | ICD-10-CM

## 2016-08-19 ENCOUNTER — Encounter: Payer: 59 | Admitting: Physical Therapy

## 2016-08-20 MED FILL — HYDROCHLOROTHIAZIDE 25 MG T: 25 | 90 days supply | Qty: 90 | Fill #0

## 2016-08-23 MED FILL — VYVANSE 20 MG CAPSULE: 20 | 30 days supply | Qty: 30 | Fill #0

## 2016-08-24 ENCOUNTER — Ambulatory Visit: Payer: 59 | Attending: Family Medicine | Admitting: Physical Therapy

## 2016-08-24 DIAGNOSIS — M6281 Muscle weakness (generalized): Secondary | ICD-10-CM | POA: Diagnosis not present

## 2016-08-24 DIAGNOSIS — M545 Low back pain: Secondary | ICD-10-CM | POA: Diagnosis not present

## 2016-08-24 DIAGNOSIS — M542 Cervicalgia: Secondary | ICD-10-CM | POA: Insufficient documentation

## 2016-08-24 DIAGNOSIS — R252 Cramp and spasm: Secondary | ICD-10-CM | POA: Diagnosis not present

## 2016-08-24 DIAGNOSIS — G8929 Other chronic pain: Secondary | ICD-10-CM | POA: Diagnosis not present
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
## 2016-08-24 NOTE — Patient Instructions (Signed)
          Also the bug exercise (opposite hand and leg movements)     Ruben Im PT [facility] [address] [place], [place] [address] Phone # [phone] Fax [phone]

## 2016-08-26 ENCOUNTER — Encounter: Payer: 59 | Admitting: Physical Therapy

## 2016-08-31 ENCOUNTER — Ambulatory Visit (HOSPITAL_BASED_OUTPATIENT_CLINIC_OR_DEPARTMENT_OTHER)
Admission: RE | Admit: 2016-08-31 | Discharge: 2016-08-31 | Disposition: A | Discharge status: Home / Self Care | Payer: 59 | Source: Ambulatory Visit | Attending: Family Medicine | Admitting: Family Medicine

## 2016-08-31 ENCOUNTER — Ambulatory Visit: Payer: 59 | Admitting: Physical Therapy

## 2016-08-31 DIAGNOSIS — M545 Low back pain, unspecified: Secondary | ICD-10-CM

## 2016-08-31 DIAGNOSIS — M6281 Muscle weakness (generalized): Secondary | ICD-10-CM | POA: Diagnosis not present

## 2016-08-31 DIAGNOSIS — M542 Cervicalgia: Secondary | ICD-10-CM

## 2016-08-31 DIAGNOSIS — Z1231 Encounter for screening mammogram for malignant neoplasm of breast: Secondary | ICD-10-CM | POA: Diagnosis not present

## 2016-08-31 DIAGNOSIS — G8929 Other chronic pain: Secondary | ICD-10-CM | POA: Diagnosis not present

## 2016-08-31 DIAGNOSIS — R252 Cramp and spasm: Secondary | ICD-10-CM

## 2016-08-31 NOTE — Therapy (Signed)
[facility] [address] [place], [place], [address] Phone: [phone]   Fax:  [phone]  Physical Therapy Treatment  Patient Details  Name: Tammy [name] MRN: [id number] Date of Birth: 25-Aug-1961 Referring Provider: Dr. Martinique  Encounter Date: 08/31/2016      PT End of Session - 08/31/16 1225    Visit Number 8   Date for PT Re-Evaluation 09/09/16   Authorization Type UMR   PT Start Time 0800   PT Stop Time 0840   PT Time Calculation (min) 40 min   Activity Tolerance Patient tolerated treatment well      Past Medical History:  Diagnosis Date  . Arthritis   . Chronic fatigue syndrome   . Hypertension   . Neuropathy [facility])     Past Surgical History:  Procedure Laterality Date  . CARDIAC CATHETERIZATION  09/18/2012  . LEFT HEART CATHETERIZATION WITH CORONARY ANGIOGRAM N/A 09/18/2012   Procedure: LEFT HEART CATHETERIZATION WITH CORONARY ANGIOGRAM;  Surgeon: Sherren Mocha, MD;  Location: [facility] CATH LAB;  Service: Cardiovascular;  Laterality: N/A;  . TONSILLECTOMY  1965    There were no vitals filed for this visit.      Subjective Assessment - 08/31/16 0810    Subjective Neck and shoulder discomfort but a little in the back.  The dry needling helps but as the week goes on it returns.     Currently in Pain? Yes   Pain Score 4    Pain Location Neck   Pain Type Chronic pain                         [facility] Adult PT Treatment/Exercise - 08/31/16 0001      Self-Care   Other Self-Care Comments  see patient instructions     Neuro Re-ed    Neuro Re-ed Details  guided systematic relaxation with muscle release during workday     Neck Exercises: Prone   Other Prone Exercise UE lift extension 5x   Other Prone Exercise head lift with UE extension 5x     Lumbar Exercises: Prone   Other Prone Lumbar Exercises lumbar multifidi press 10x   Other Prone Lumbar Exercises multifidi press with HS  curls 5x right and left     Moist Heat Therapy   Number Minutes Moist Heat --  declines has another appt today     Manual Therapy   Soft tissue mobilization uppper traps bilaterally, cervical paraspinals; suboccipitals           Trigger Point Dry Needling - 08/31/16 1241    Consent Given? Yes   Muscles Treated Upper Body --  bilateral cervical multidi   Upper Trapezius Response Twitch reponse elicited;Palpable increased muscle length   SubOccipitals Response Twitch response elicited;Palpable increased muscle length      Performed bilaterally.        PT Education - 08/31/16 1224    Education provided Yes   Education Details prone multifidi press and scapular strengthening in prone;  info on pain neuroscience education book/workbook and pain psychologist    Person(s) Educated Patient   Methods Explanation;Demonstration;Handout   Comprehension Verbalized understanding;Returned demonstration          PT Short Term Goals - 08/31/16 1245      PT SHORT TERM GOAL #1   Title The patient will demonstrate understanding of postural awareness and basic self care strategies and exercise  08/12/16   Status Achieved  PT SHORT TERM GOAL #2   Title The patient will report a 30% reduction in spinal pain (neck and/or back)   Status Achieved     PT SHORT TERM GOAL #3   Title Cervical rotation improved to 30 degrees needed for driving   Status Achieved           PT Long Term Goals - 08/31/16 1246      PT LONG TERM GOAL #1   Title The patient will be independent in safe self progression of HEP and self care strategies including work place ergonomics  09/09/16   Time 8   Period Weeks   Status On-going     PT LONG TERM GOAL #2   Title The patient will have improved cervical flexion to 35 degrees, extension to 45 degrees and left sidebending to 25 degrees needed for ADLs including driving   Time 8   Period Weeks   Status On-going     PT LONG TERM GOAL #3   Title The  patient will have deep cervical muscle strength and periscapular strength to at least 4/5 needed for endurance for sitting at work longer periods of time   Time 8   Period Weeks   Status On-going     PT LONG TERM GOAL #4   Title Overall pain reduction to 60% with home and work ADLs   Time 8   Period Weeks   Status On-going     PT LONG TERM GOAL #5   Title FOTO functional outcome score improved from 41% limitation to 34% indicating improved function with less pain   Time 8   Period Weeks   Status On-going               Plan - 08/31/16 1226    Clinical Impression Statement Extensive discussion on relaxation strategies as patient reports she finds herself tensed up and pressing her muscles into the chair while working.  Also discussed therapeutic neuroscience education resources for self guidance  and activity/exercise progression.       PT Next Visit Plan pain education;  continue with low level exercise and promote independence;  decrease dry needling and manual therapy      Patient will benefit from skilled therapeutic intervention in order to improve the following deficits and impairments:     Visit Diagnosis: Cervicalgia  Chronic bilateral low back pain without sciatica  Cramp and spasm  Muscle weakness (generalized)     Problem List Patient Active Problem List   Diagnosis Date Noted  . Chronic back pain 07/13/2016  . Allergic rhinitis 03/09/2016  . Headache(784.0) 03/25/2014  . Nausea and vomiting 03/25/2014  . Essential hypertension 02/18/2014  . Encounter for therapeutic drug monitoring 04/26/2013  . Chronic pain syndrome 02/22/2013  . Chronic insomnia 02/22/2013  . Chronic fatigue syndrome 02/22/2013  . Shortness of breath 11/29/2012  . Paresthesias 10/25/2012  . Muscle weakness 10/25/2012  . Malaise and fatigue 10/18/2012  . Spasm of muscle 10/18/2012  . Disturbance of skin sensation 10/18/2012  . Hyperlipidemia 09/18/2012  . Hypokalemia  09/18/2012  . Neuropathy ([place]) 09/18/2012  . Chest pain 09/17/2012  . CONSTIPATION 10/23/2008    Ruben Im, PT 08/31/16 12:53 PM Phone: [phone] Fax: [phone]  Alvera Singh 08/31/2016, 12:52 PM  [place] [facility] [address] [place], [place], [address] Phone: [phone]   Fax:  [phone]  Name: SINIAH DAMICO MRN: [id number] Date of Birth: [birth year]

## 2016-08-31 NOTE — Patient Instructions (Addendum)
        Psychologist in [employer]:  Norval Gable   [phone]   ([employer])   [place].     [facility]  Publications look for "Why do I hurt? "  Link through [employer]   $ 5, $18     [facility] [address], [place], [place] [address] Phone # [phone] Fax [phone]

## 2016-09-07 ENCOUNTER — Ambulatory Visit: Payer: 59 | Admitting: Physical Therapy

## 2016-09-07 DIAGNOSIS — G8929 Other chronic pain: Secondary | ICD-10-CM

## 2016-09-07 DIAGNOSIS — G4700 Insomnia, unspecified: Secondary | ICD-10-CM | POA: Diagnosis not present

## 2016-09-07 DIAGNOSIS — I10 Essential (primary) hypertension: Secondary | ICD-10-CM | POA: Diagnosis not present

## 2016-09-07 DIAGNOSIS — M6281 Muscle weakness (generalized): Secondary | ICD-10-CM | POA: Diagnosis not present

## 2016-09-07 DIAGNOSIS — R252 Cramp and spasm: Secondary | ICD-10-CM

## 2016-09-07 DIAGNOSIS — M542 Cervicalgia: Secondary | ICD-10-CM

## 2016-09-07 DIAGNOSIS — M545 Low back pain: Secondary | ICD-10-CM | POA: Diagnosis not present

## 2016-09-07 DIAGNOSIS — M797 Fibromyalgia: Secondary | ICD-10-CM | POA: Diagnosis not present

## 2016-09-07 DIAGNOSIS — R41840 Attention and concentration deficit: Secondary | ICD-10-CM | POA: Diagnosis not present

## 2016-09-07 DIAGNOSIS — G894 Chronic pain syndrome: Secondary | ICD-10-CM | POA: Diagnosis not present

## 2016-09-07 NOTE — Therapy (Signed)
[facility] [address], [place], [place], [address] Phone: [phone]   Fax:  [phone]  Physical Therapy Treatment/Discharge Summary  Patient Details  Name: Tammy [name] MRN: [id number] Date of Birth: 17-Feb-1961 Referring Provider: Dr. Martinique  Encounter Date: 09/07/2016      PT End of Session - 09/07/16 0841    Visit Number 9   Date for PT Re-Evaluation 09/09/16   Authorization Type UMR   PT Start Time 0806   PT Stop Time 0844   PT Time Calculation (min) 38 min   Activity Tolerance Patient tolerated treatment well      Past Medical History:  Diagnosis Date  . Arthritis   . Chronic fatigue syndrome   . Hypertension   . Neuropathy [facility])     Past Surgical History:  Procedure Laterality Date  . CARDIAC CATHETERIZATION  09/18/2012  . LEFT HEART CATHETERIZATION WITH CORONARY ANGIOGRAM N/A 09/18/2012   Procedure: LEFT HEART CATHETERIZATION WITH CORONARY ANGIOGRAM;  Surgeon: Sherren Mocha, MD;  Location: [facility] CATH LAB;  Service: Cardiovascular;  Laterality: N/A;  . TONSILLECTOMY  1965    There were no vitals filed for this visit.      Subjective Assessment - 09/07/16 0808    Subjective I'm OK.   I realized I have not consistently done the exercises and I have gone awry.  Neck and shoulders 4/10 today.  Some electrical pain this morning.  Patient has been rereading a chronic pain book    Currently in Pain? Yes   Pain Score 4    Pain Location Neck   Pain Orientation Right;Left            [facility] PT Assessment - 09/07/16 0001      Observation/Other Assessments   Focus on Therapeutic Outcomes (FOTO)  38% limitation     AROM   Cervical Flexion 40   Cervical Extension 45   Cervical - Right Side Bend 30   Cervical - Left Side Bend 25   Cervical - Right Rotation 30   Cervical - Left Rotation 38   Lumbar Flexion 70   Lumbar Extension 15   Lumbar - Right Side Bend 25   Lumbar - Left Side Bend 25      Strength   Overall Strength Comments abdominal and trunk extensor strength 4/5   Cervical Flexion 4/5   Cervical Extension 4/5                     [facility] Adult PT Treatment/Exercise - 09/07/16 0001      Self-Care   Other Self-Care Comments  neuroscience of pain education and brain desensitization strategies     Lumbar Exercises: Supine   Other Supine Lumbar Exercises comprehensive review of safe, self progression of HEP                  PT Short Term Goals - 09/07/16 0840      PT SHORT TERM GOAL #1   Title The patient will demonstrate understanding of postural awareness and basic self care strategies and exercise  08/12/16   Status Achieved     PT SHORT TERM GOAL #2   Title The patient will report a 30% reduction in spinal pain (neck and/or back)   Status Achieved     PT SHORT TERM GOAL #3   Title Cervical rotation improved to 30 degrees needed for driving   Status Achieved  PT Long Term Goals - 09/07/16 3810      PT LONG TERM GOAL #1   Title The patient will be independent in safe self progression of HEP and self care strategies including work place ergonomics  09/09/16   Status Achieved     PT LONG TERM GOAL #2   Title The patient will have improved cervical flexion to 35 degrees, extension to 45 degrees and left sidebending to 25 degrees needed for ADLs including driving   Status Partially Met     PT LONG TERM GOAL #3   Title The patient will have deep cervical muscle strength and periscapular strength to at least 4/5 needed for endurance for sitting at work longer periods of time   Status Achieved     PT LONG TERM GOAL #4   Title Overall pain reduction to 60% with home and work ADLs   Baseline 40% improved   Status Partially Met     PT LONG TERM GOAL #5   Title FOTO functional outcome score improved from 41% limitation to 34% indicating improved function with less pain   Status Partially Met               Plan  - 09/07/16 2013    Clinical Impression Statement The patient reports an overall improvement of "at least 40%" in neck pain and a significant improvement in headache reduction.  She reports that dry needling was effective in the short term.  We discussed extensively the neuroscience of pain physiology and strategies in brain desensitization and pain modulation.  Her cervical ROM improved since initial evaluation and her lumbar ROM continues to be WFLs.  Improved in deep cervical and abdominal muscle activation/core strength.  Her FOTO score improved somewhat from 41% limitation to 38%.  Progress has been slower secondary to the chronicity of her problem but she should make further gains as she continues with her HEP.  Recommend discharge from PT at this time with goals partially met.        Patient will benefit from skilled therapeutic intervention in order to improve the following deficits and impairments:     Visit Diagnosis: Cervicalgia  Chronic bilateral low back pain without sciatica  Cramp and spasm  Muscle weakness (generalized)  PHYSICAL THERAPY DISCHARGE SUMMARY  Visits from Start of Care: 9  Current functional level related to goals / functional outcomes: See clinical impressions above   Remaining deficits: As above   Education / Equipment: HEP  Plan: Patient agrees to discharge.  Patient goals were partially met. Patient is being discharged due to being pleased with the current functional level.  ?????       Problem List Patient Active Problem List   Diagnosis Date Noted  . Chronic back pain 07/13/2016  . Allergic rhinitis 03/09/2016  . Headache(784.0) 03/25/2014  . Nausea and vomiting 03/25/2014  . Essential hypertension 02/18/2014  . Encounter for therapeutic drug monitoring 04/26/2013  . Chronic pain syndrome 02/22/2013  . Chronic insomnia 02/22/2013  . Chronic fatigue syndrome 02/22/2013  . Shortness of breath 11/29/2012  . Paresthesias 10/25/2012  .  Muscle weakness 10/25/2012  . Malaise and fatigue 10/18/2012  . Spasm of muscle 10/18/2012  . Disturbance of skin sensation 10/18/2012  . Hyperlipidemia 09/18/2012  . Hypokalemia 09/18/2012  . Neuropathy ([place]) 09/18/2012  . Chest pain 09/17/2012  . CONSTIPATION 10/23/2008   Ruben Im, PT 09/07/16 8:21 PM Phone: [phone] Fax: [phone] Alvera Singh 09/07/2016, 8:19 PM  [facility] [address], [place], [place], [address] Phone: [phone]   Fax:  [phone]  Name: Tammy [name] MRN: [id number] Date of Birth: 12/25/1960

## 2016-09-28 MED FILL — VYVANSE 20 MG CAPSULE: 20 | 30 days supply | Qty: 30 | Fill #0

## 2016-10-22 MED FILL — ADDERALL XR 5 MG CAPSULE SA: 5 | 60 days supply | Qty: 60 | Fill #0

## 2016-10-28 MED FILL — ZOLPIDEM TARTRATE 10 MG TAB: 10 | 90 days supply | Qty: 90 | Fill #0

## 2016-11-12 MED FILL — HYDROCHLOROTHIAZIDE 25 MG T: 25 | 90 days supply | Qty: 90 | Fill #0

## 2017-01-17 ENCOUNTER — Ambulatory Visit (HOSPITAL_BASED_OUTPATIENT_CLINIC_OR_DEPARTMENT_OTHER)
Admission: RE | Admit: 2017-01-17 | Discharge: 2017-01-17 | Disposition: A | Discharge status: Home / Self Care | Payer: 59 | Source: Ambulatory Visit | Attending: Orthopaedic Surgery | Admitting: Orthopaedic Surgery

## 2017-01-17 ENCOUNTER — Other Ambulatory Visit (INDEPENDENT_AMBULATORY_CARE_PROVIDER_SITE_OTHER): Payer: Self-pay

## 2017-01-17 ENCOUNTER — Ambulatory Visit (INDEPENDENT_AMBULATORY_CARE_PROVIDER_SITE_OTHER): Payer: 59 | Admitting: Orthopaedic Surgery

## 2017-01-17 DIAGNOSIS — M25561 Pain in right knee: Secondary | ICD-10-CM | POA: Insufficient documentation

## 2017-01-17 DIAGNOSIS — M25461 Effusion, right knee: Secondary | ICD-10-CM | POA: Diagnosis not present

## 2017-01-17 MED ORDER — METHYLPREDNISOLONE ACETATE 40 MG/ML IJ SUSP
40.0000 mg | INTRAMUSCULAR | Status: AC | PRN
  Administered 2017-01-17: 40 mg via INTRA_ARTICULAR

## 2017-01-17 NOTE — Progress Notes (Signed)
Office Visit Note   Patient: Tammy [name]           Date of Birth: [birth year]           MRN: [id number] Visit Date: 01/17/2017              Requested by: Betty G Martinique, MD [address] [place], [place] [address] PCP: Betty Martinique, MD   Assessment & Plan: Visit Diagnoses: No diagnosis found.  Plan: I spoke with her about trying a steroid injection in her knee. I explained the risk and benefits of this. I will have her work on quad strengthening exercises well. All questions were encouraged and answered. She tolerated the steroid injection well. I'll reevaluate her in 2 weeks. If she still having a mild effusion in the same symptoms will likely obtain an MRI of her knee to rule out a meniscal tear.  Follow-Up Instructions: Return in about 2 weeks (around 01/31/2017).   Orders:  No orders of the defined types were placed in this encounter.  No orders of the defined types were placed in this encounter.     Procedures: Large Joint Inj Date/Time: 01/17/2017 9:33 AM Performed by: Mcarthur Rossetti Authorized by: Mcarthur Rossetti   Location:  Knee Site:  R knee Ultrasound Guidance: No   Fluoroscopic Guidance: No   Arthrogram: No   Medications:  40 mg methylPREDNISolone acetate 40 MG/ML     Clinical Data: No additional findings.   Subjective: No chief complaint on file. The patient comes in with chief complaint of right knee pain for the past 2-3 weeks with no known injury. She was doing some yard work is been slowly getting worse in terms of a sharp pain on the medial aspect of her knee. 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 The knee is starting to bother her quite a bit. She is someone who does have high arches and she's had problems with a rolling her ankle and foot as well. The pain is irritating type of pain. She's been having a little bit of swelling in that right knee. Anti-inflammatories she can take but  do bother her stomach if she is been on for a while  HPI  Review of Systems She denies any headache, chest pain, shortness of breath, fever, chills, nausea, vomiting.  Objective: Vital Signs: LMP 01/26/2011   Physical Exam She is alert and oriented 3 and does walk with a slight limp. Ortho Exam Examination of her right foot does show high arches on both sides. She has a tight heel cord as well and she'll likely pronates her foot quite a bit when she walks. Examination of her right knee she has medial joint line tenderness and there is a mild effusion. Her McMurray's exam causes some pain but no locking. Her Lockman's exam is negative. There is no instability with varus valgus stressing. Specialty Comments:  No specialty comments available.  Imaging: Dg Knee 1-2 Views Right  Result Date: 01/17/2017 CLINICAL DATA:  Pain after gardening. Pain is along the medial portion of the right knee . EXAM: RIGHT KNEE - 1-2 VIEW COMPARISON:  No recent prior. FINDINGS: Tiny knee joint effusion. Mild medial and patellofemoral compartment degenerative change. No acute bony abnormality identified. Tiny corticated bony density noted adjacent to the fibular head. This may represent site of prior injury. IMPRESSION: Mild medial and patellofemoral compartment degenerative change with small knee joint effusion. No acute bony abnormalities identified.  Electronically Signed   By: Marcello Moores  Register   On: 01/17/2017 09:21   X-rays of her right knee and tingly reviewed by me show some just mild arthritic changes but no acute findings.  PMFS History: Patient Active Problem List   Diagnosis Date Noted  . Chronic back pain 07/13/2016  . Allergic rhinitis 03/09/2016  . Headache(784.0) 03/25/2014  . Nausea and vomiting 03/25/2014  . Essential hypertension 02/18/2014  . Encounter for therapeutic drug monitoring 04/26/2013  . Chronic pain syndrome 02/22/2013  . Chronic insomnia 02/22/2013  . Chronic fatigue syndrome  02/22/2013  . Shortness of breath 11/29/2012  . Paresthesias 10/25/2012  . Muscle weakness 10/25/2012  . Malaise and fatigue 10/18/2012  . Spasm of muscle 10/18/2012  . Disturbance of skin sensation 10/18/2012  . Hyperlipidemia 09/18/2012  . Hypokalemia 09/18/2012  . Neuropathy 09/18/2012  . Chest pain 09/17/2012  . CONSTIPATION 10/23/2008   Past Medical History:  Diagnosis Date  . Arthritis   . Chronic fatigue syndrome   . Hypertension   . Neuropathy ([place])     Family History  Problem Relation Age of Onset  . Heart attack Maternal Grandmother 50  . Arthritis Maternal Grandmother   . Hyperlipidemia Maternal Grandmother   . Heart disease Maternal Grandmother   . Diabetes Maternal Grandmother   . Heart attack Brother   . Colon cancer Neg Hx   . Colon polyps Father   . Esophageal cancer Neg Hx   . Kidney disease Neg Hx     Past Surgical History:  Procedure Laterality Date  . CARDIAC CATHETERIZATION  09/18/2012  . LEFT HEART CATHETERIZATION WITH CORONARY ANGIOGRAM N/A 09/18/2012   Procedure: LEFT HEART CATHETERIZATION WITH CORONARY ANGIOGRAM;  Surgeon: Sherren Mocha, MD;  Location: [facility] CATH LAB;  Service: Cardiovascular;  Laterality: N/A;  . TONSILLECTOMY  1965   Social History   Occupational History  . Therapist [employer]   Social History Main Topics  . Smoking status: Never Smoker  . Smokeless tobacco: Not on file  . Alcohol use No  . Drug use: No  . Sexual activity: Not on file

## 2017-01-25 MED FILL — ZOLPIDEM TARTRATE 10 MG TAB: 10 | 90 days supply | Qty: 90 | Fill #1

## 2017-01-26 MED FILL — VYVANSE 20 MG CAPSULE: 20 | 30 days supply | Qty: 30 | Fill #0

## 2017-01-31 ENCOUNTER — Ambulatory Visit (INDEPENDENT_AMBULATORY_CARE_PROVIDER_SITE_OTHER): Payer: Self-pay | Admitting: Orthopaedic Surgery

## 2017-02-09 MED FILL — HYDROCHLOROTHIAZIDE 25 MG T: 25 | 90 days supply | Qty: 90 | Fill #1

## 2017-03-04 DIAGNOSIS — E782 Mixed hyperlipidemia: Secondary | ICD-10-CM | POA: Diagnosis not present

## 2017-03-04 DIAGNOSIS — Z0000 Encounter for general adult medical examination without abnormal findings: Secondary | ICD-10-CM | POA: Diagnosis not present

## 2017-03-04 DIAGNOSIS — R635 Abnormal weight gain: Secondary | ICD-10-CM | POA: Diagnosis not present

## 2017-03-04 DIAGNOSIS — Z131 Encounter for screening for diabetes mellitus: Secondary | ICD-10-CM | POA: Diagnosis not present

## 2017-03-04 DIAGNOSIS — Z130 Encounter for screening for diseases of the blood and blood-forming organs and certain disorders involving the immune mechanism: Secondary | ICD-10-CM | POA: Diagnosis not present

## 2017-03-04 DIAGNOSIS — Z1211 Encounter for screening for malignant neoplasm of colon: Secondary | ICD-10-CM | POA: Diagnosis not present

## 2017-03-04 DIAGNOSIS — I10 Essential (primary) hypertension: Secondary | ICD-10-CM | POA: Diagnosis not present

## 2017-03-04 DIAGNOSIS — R41840 Attention and concentration deficit: Secondary | ICD-10-CM | POA: Diagnosis not present

## 2017-03-04 DIAGNOSIS — G4709 Other insomnia: Secondary | ICD-10-CM | POA: Diagnosis not present

## 2017-03-04 DIAGNOSIS — Z1231 Encounter for screening mammogram for malignant neoplasm of breast: Secondary | ICD-10-CM | POA: Diagnosis not present

## 2017-04-01 MED FILL — DOXYCYCLINE HYCLATE 100 MG: 100 | 1 days supply | Qty: 2 | Fill #0

## 2017-04-25 MED FILL — ZOLPIDEM TARTRATE 10 MG TAB: 10 | 90 days supply | Qty: 90 | Fill #0

## 2017-04-25 MED FILL — HYDROCHLOROTHIAZIDE 25 MG T: 25 | 90 days supply | Qty: 90 | Fill #0

## 2017-04-27 MED FILL — VYVANSE 20 MG CAPSULE: 20 | 30 days supply | Qty: 30 | Fill #0

## 2017-05-30 MED FILL — VYVANSE 20 MG CAPSULE: 20 | 30 days supply | Qty: 30 | Fill #0

## 2017-06-09 ENCOUNTER — Encounter: Payer: Self-pay | Admitting: Family Medicine

## 2017-06-27 ENCOUNTER — Ambulatory Visit: Payer: 59 | Admitting: Family Medicine

## 2017-06-29 MED FILL — VYVANSE 20 MG CAPSULE: 20 | 30 days supply | Qty: 30 | Fill #0

## 2017-07-07 DIAGNOSIS — G894 Chronic pain syndrome: Secondary | ICD-10-CM | POA: Diagnosis not present

## 2017-07-07 DIAGNOSIS — E782 Mixed hyperlipidemia: Secondary | ICD-10-CM | POA: Diagnosis not present

## 2017-07-07 DIAGNOSIS — M79641 Pain in right hand: Secondary | ICD-10-CM | POA: Diagnosis not present

## 2017-07-07 DIAGNOSIS — I10 Essential (primary) hypertension: Secondary | ICD-10-CM | POA: Diagnosis not present

## 2017-07-07 DIAGNOSIS — M2550 Pain in unspecified joint: Secondary | ICD-10-CM | POA: Diagnosis not present

## 2017-07-22 MED FILL — HYDROCHLOROTHIAZIDE 25 MG T: 25 | 90 days supply | Qty: 90 | Fill #1

## 2017-07-22 MED FILL — ZOLPIDEM TARTRATE 10 MG TAB: 10 | 90 days supply | Qty: 90 | Fill #1

## 2017-07-29 ENCOUNTER — Encounter (INDEPENDENT_AMBULATORY_CARE_PROVIDER_SITE_OTHER): Payer: Self-pay | Admitting: Orthopaedic Surgery

## 2017-07-29 ENCOUNTER — Ambulatory Visit (INDEPENDENT_AMBULATORY_CARE_PROVIDER_SITE_OTHER): Payer: 59 | Admitting: Orthopaedic Surgery

## 2017-07-29 DIAGNOSIS — M79642 Pain in left hand: Secondary | ICD-10-CM

## 2017-07-29 DIAGNOSIS — M79641 Pain in right hand: Secondary | ICD-10-CM | POA: Diagnosis not present

## 2017-07-29 DIAGNOSIS — M542 Cervicalgia: Secondary | ICD-10-CM | POA: Diagnosis not present

## 2017-07-29 MED FILL — VYVANSE 20 MG CAPSULE: 20 | 30 days supply | Qty: 30 | Fill #0

## 2017-07-29 NOTE — Progress Notes (Signed)
Office Visit Note   Patient: Tammy [name]           Date of Birth: 15-Jan-1961           MRN: [id number] Visit Date: 07/29/2017              Requested by: Martinique, Betty G, MD [address] [place], [place] [address] PCP: Glenford Bayley, DO   Assessment & Plan: Visit Diagnoses:  1. Pain in both hands   2. Cervicalgia     Plan: Impression is bilateral hand pain and numbness possible carpal tunnel syndrome.  Carpal tunnel splints were provided today to wear at nighttime.  Recommend watching the cyst for now as  is difficult to discern whether this is a cyst or not.  She also complained of neck discomfort and stiffness which we made a referral to physical therapy for.  Questions encouraged and answered.  Follow-up as needed.  Follow-Up Instructions: Return if symptoms worsen or fail to improve.   Orders:  No orders of the defined types were placed in this encounter.  No orders of the defined types were placed in this encounter.     Procedures: No procedures performed   Clinical Data: No additional findings.   Subjective: Chief Complaint  Patient presents with  . Right Wrist - Pain    We will patient is a 56 year old female who comes in with bilateral wrist and hand pain.  She is also complaining of a small cyst in the right middle finger at the flexion palmar crease.  Denies any injuries.  She also complains of some pain in her palm that radiates up the forearm.  She endorses night pain.    Review of Systems  Constitutional: Negative.   HENT: Negative.   Eyes: Negative.   Respiratory: Negative.   Cardiovascular: Negative.   Endocrine: Negative.   Musculoskeletal: Negative.   Neurological: Negative.   Hematological: Negative.   Psychiatric/Behavioral: Negative.   All other systems reviewed and are negative.    Objective: Vital Signs: LMP 01/26/2011   Physical Exam  Constitutional: She is oriented to person, place, and time. She appears well-developed  and well-nourished.  HENT:  Head: Normocephalic and atraumatic.  Eyes: EOM are normal.  Neck: Neck supple.  Pulmonary/Chest: Effort normal.  Abdominal: Soft.  Neurological: She is alert and oriented to person, place, and time.  Skin: Skin is warm. Capillary refill takes less than 2 seconds.  Psychiatric: She has a normal mood and affect. Her behavior is normal. Judgment and thought content normal.  Nursing note and vitals reviewed.   Ortho Exam Bilateral hand exam shows no muscular atrophy.  Negative carpal tunnel compression signs.  Equivocal palpable cyst in the right middle finger.  No triggering of the finger. Specialty Comments:  No specialty comments available.  Imaging: No results found.   PMFS History: Patient Active Problem List   Diagnosis Date Noted  . Acute pain of right knee 01/17/2017  . Chronic back pain 07/13/2016  . Allergic rhinitis 03/09/2016  . Headache(784.0) 03/25/2014  . Nausea and vomiting 03/25/2014  . Essential hypertension 02/18/2014  . Encounter for therapeutic drug monitoring 04/26/2013  . Chronic pain syndrome 02/22/2013  . Chronic insomnia 02/22/2013  . Chronic fatigue syndrome 02/22/2013  . Shortness of breath 11/29/2012  . Paresthesias 10/25/2012  . Muscle weakness 10/25/2012  . Malaise and fatigue 10/18/2012  . Spasm of muscle 10/18/2012  . Disturbance of skin sensation 10/18/2012  . Hyperlipidemia 09/18/2012  .  Hypokalemia 09/18/2012  . Neuropathy 09/18/2012  . Chest pain 09/17/2012  . CONSTIPATION 10/23/2008   Past Medical History:  Diagnosis Date  . Arthritis   . Chronic fatigue syndrome   . Hypertension   . Neuropathy     Family History  Problem Relation Age of Onset  . Heart attack Maternal Grandmother 50  . Arthritis Maternal Grandmother   . Hyperlipidemia Maternal Grandmother   . Heart disease Maternal Grandmother   . Diabetes Maternal Grandmother   . Heart attack Brother   . Colon cancer Neg Hx   . Colon polyps  Father   . Esophageal cancer Neg Hx   . Kidney disease Neg Hx     Past Surgical History:  Procedure Laterality Date  . CARDIAC CATHETERIZATION  09/18/2012  . TONSILLECTOMY  1965   Social History   Occupational History  . Occupation: Diplomatic Services operational officer: [employer]  Tobacco Use  . Smoking status: Never Smoker  Substance and Sexual Activity  . Alcohol use: No  . Drug use: No  . Sexual activity: Not on file

## 2017-08-04 MED FILL — DULoxetine HCL 30 MG CPEP: 30 | 90 days supply | Qty: 90 | Fill #0

## 2017-08-25 DIAGNOSIS — G4700 Insomnia, unspecified: Secondary | ICD-10-CM | POA: Diagnosis not present

## 2017-08-25 DIAGNOSIS — I10 Essential (primary) hypertension: Secondary | ICD-10-CM | POA: Diagnosis not present

## 2017-08-25 DIAGNOSIS — R41840 Attention and concentration deficit: Secondary | ICD-10-CM | POA: Diagnosis not present

## 2017-08-31 ENCOUNTER — Other Ambulatory Visit: Payer: Self-pay | Admitting: Family Medicine

## 2017-08-31 ENCOUNTER — Encounter (HOSPITAL_BASED_OUTPATIENT_CLINIC_OR_DEPARTMENT_OTHER): Payer: Self-pay

## 2017-08-31 ENCOUNTER — Ambulatory Visit (HOSPITAL_BASED_OUTPATIENT_CLINIC_OR_DEPARTMENT_OTHER)
Admission: RE | Admit: 2017-08-31 | Discharge: 2017-08-31 | Disposition: A | Discharge status: Home / Self Care | Payer: 59 | Source: Ambulatory Visit | Attending: Family Medicine | Admitting: Family Medicine

## 2017-08-31 DIAGNOSIS — Z1231 Encounter for screening mammogram for malignant neoplasm of breast: Secondary | ICD-10-CM | POA: Diagnosis present

## 2017-09-12 MED FILL — VYVANSE 20 MG CAPSULE: 20 | 30 days supply | Qty: 30 | Fill #0

## 2017-10-12 MED FILL — VYVANSE 20 MG CAPSULE: 20 | 30 days supply | Qty: 30 | Fill #0

## 2017-10-21 MED FILL — HYDROCHLOROTHIAZIDE 25 MG T: 25 | 90 days supply | Qty: 90 | Fill #2

## 2017-10-21 MED FILL — ZOLPIDEM TARTRATE 10 MG TAB: 10 | 90 days supply | Qty: 90 | Fill #0

## 2017-11-01 MED FILL — DULoxetine HCL 30 MG CPEP: 30 | 90 days supply | Qty: 90 | Fill #1

## 2017-11-14 MED FILL — VYVANSE 20 MG CAPSULE: 20 | 30 days supply | Qty: 30 | Fill #0

## 2017-12-05 DIAGNOSIS — L98491 Non-pressure chronic ulcer of skin of other sites limited to breakdown of skin: Secondary | ICD-10-CM | POA: Diagnosis not present

## 2017-12-05 DIAGNOSIS — R41840 Attention and concentration deficit: Secondary | ICD-10-CM | POA: Diagnosis not present

## 2017-12-05 DIAGNOSIS — L989 Disorder of the skin and subcutaneous tissue, unspecified: Secondary | ICD-10-CM | POA: Diagnosis not present

## 2017-12-15 MED FILL — VYVANSE 20 MG CAPSULE: 20 | 30 days supply | Qty: 30 | Fill #0

## 2017-12-19 ENCOUNTER — Other Ambulatory Visit: Payer: Self-pay

## 2017-12-19 DIAGNOSIS — D485 Neoplasm of uncertain behavior of skin: Secondary | ICD-10-CM | POA: Diagnosis not present

## 2018-01-16 MED FILL — VYVANSE 20 MG CAPSULE: 20 | 30 days supply | Qty: 30 | Fill #0

## 2018-01-24 MED FILL — HYDROCHLOROTHIAZIDE 25 MG T: 25 | 90 days supply | Qty: 90 | Fill #3

## 2018-01-24 MED FILL — DULoxetine HCL 30 MG CPEP: 30 | 90 days supply | Qty: 90 | Fill #2

## 2018-01-24 MED FILL — ZOLPIDEM TARTRATE 10 MG TAB: 10 | 90 days supply | Qty: 90 | Fill #1

## 2018-02-15 MED FILL — VYVANSE 20 MG CAPSULE: 20 | 30 days supply | Qty: 30 | Fill #0

## 2018-03-08 ENCOUNTER — Ambulatory Visit: Payer: 59 | Attending: Family Medicine | Admitting: Physical Therapy

## 2018-03-08 ENCOUNTER — Other Ambulatory Visit: Payer: Self-pay

## 2018-03-08 ENCOUNTER — Encounter: Payer: Self-pay | Admitting: Physical Therapy

## 2018-03-08 DIAGNOSIS — G8929 Other chronic pain: Secondary | ICD-10-CM | POA: Insufficient documentation

## 2018-03-08 DIAGNOSIS — M25552 Pain in left hip: Secondary | ICD-10-CM | POA: Diagnosis present

## 2018-03-08 DIAGNOSIS — R29898 Other symptoms and signs involving the musculoskeletal system: Secondary | ICD-10-CM | POA: Insufficient documentation

## 2018-03-08 DIAGNOSIS — M5442 Lumbago with sciatica, left side: Secondary | ICD-10-CM | POA: Insufficient documentation

## 2018-03-08 DIAGNOSIS — M6281 Muscle weakness (generalized): Secondary | ICD-10-CM | POA: Diagnosis present

## 2018-03-08 DIAGNOSIS — R262 Difficulty in walking, not elsewhere classified: Secondary | ICD-10-CM | POA: Diagnosis present

## 2018-03-08 NOTE — Therapy (Signed)
[facility] [address]  [place], [place], [address] Phone: [phone]   Fax:  [phone]  Physical Therapy Evaluation  Patient Details  Name: Tammy [name] MRN: [id number] Date of Birth: Nov 09, 1960 Referring Provider: Rikki Spearing, DO   Encounter Date: 03/08/2018  PT End of Session - 03/08/18 1328    Visit Number  1    Number of Visits  17    Date for PT Re-Evaluation  05/03/18    Authorization Type  [facility]    PT Start Time  0801    PT Stop Time  0849    PT Time Calculation (min)  48 min    Activity Tolerance  Patient tolerated treatment well    Behavior During Therapy  [facility] for tasks assessed/performed       Past Medical History:  Diagnosis Date  . Arthritis   . Chronic fatigue syndrome   . Hypertension   . Neuropathy     Past Surgical History:  Procedure Laterality Date  . CARDIAC CATHETERIZATION  09/18/2012  . LEFT HEART CATHETERIZATION WITH CORONARY ANGIOGRAM N/A 09/18/2012   Procedure: LEFT HEART CATHETERIZATION WITH CORONARY ANGIOGRAM;  Surgeon: Sherren Mocha, MD;  Location: [facility] CATH LAB;  Service: Cardiovascular;  Laterality: N/A;  . TONSILLECTOMY  1965    There were no vitals filed for this visit.   Subjective Assessment - 03/08/18 0805    Subjective  Patient reports L hip pain started about 3 months ago. Had been doing 21 day fix exercise program which was helping her strengthen core and move better, then started consistently walking about 3 months ago. Symptoms: initially pain waking her up at night, L sided back pain and tenderness in L buttock which radiates into groin and down HS; also reports that the L hip locks up and affects her gait. Intermittent N/T down L LE at end of day. Pain at best: 0/10, at worst: 8/10. Patient has history of LBP with fluctuations in severely of several years duration.     Pertinent History  R knee pain, chronic back pain, HTN, chronic pain syndrome, chronic  fatigue syndrome, paresthesias, HLD, hypokalemia    Limitations  Sitting;Lifting;Walking;House hold activities;Standing    How long can you sit comfortably?  15 min    How long can you stand comfortably?  10 min    How long can you walk comfortably?  5 min    Diagnostic tests  none recent    Patient Stated Goals  walk for an hour, exercise walk, water aerobics    Currently in Pain?  Yes    Pain Score  3     Pain Location  Buttocks    Pain Orientation  Left    Pain Descriptors / Indicators  Tightness    Pain Type  Chronic pain    Aggravating Factors   walking, abducting and extending L hip    Pain Relieving Factors  KTOS stretch         [facility] PT Assessment - 03/08/18 0825      Assessment   Medical Diagnosis  Pain in unspecified hip (L)    Referring Provider  Rikki Spearing, DO    Onset Date/Surgical Date  12/06/17    Next MD Visit  03/09/18    Prior Therapy  Yes- for neck and back      Precautions   Precautions  None      Restrictions   Weight Bearing Restrictions  No  Balance Screen   Has the patient fallen in the past 6 months  No    Has the patient had a decrease in activity level because of a fear of falling?   No    Is the patient reluctant to leave their home because of a fear of falling?   No      Home Film/video editor residence    Living Arrangements  Spouse/significant other    Available Help at Discharge  Family    Type of [place] to enter    Entrance Stairs-Number of Steps  3    Entrance Stairs-Rails  [place]  One level    [place]  None      Prior Function   Level of Independence  Independent    Vocation  Full time employment    Vocation Requirements  therapist    Leisure  sitting, has a standing desk      Cognition   Overall Cognitive Status  Within Functional Limits for tasks assessed      Observation/Other Assessments   Focus on Therapeutic Outcomes (FOTO)   Hip: 55 (45%  limited, 34% predicted)      Sensation   Light Touch  Appears Intact      Coordination   Gross Motor Movements are Fluid and Coordinated  No      Posture/Postural Control   Posture/Postural Control  Postural limitations    Postural Limitations  Rounded Shoulders;Forward head      ROM / Strength   AROM / PROM / Strength  AROM;PROM;Strength      AROM   AROM Assessment Site  Lumbar;Hip    Right/Left Hip  Left    Left Hip External Rotation   40    Left Hip Internal Rotation   33 pain    Lumbar Flexion  WFL mild c/o pain in L LB and buttock    Lumbar Extension  WFL    Lumbar - Right Side Bend  distal thigh L LB pain    Lumbar - Left Side Bend  distal thigh L LB pain    Lumbar - Right Rotation  WFL    Lumbar - Left Rotation  [facility]      Strength   Strength Assessment Site  Hip;Knee;Ankle    Right/Left Hip  Right;Left    Right Hip Flexion  4-/5    Right Hip ABduction  4+/5    Right Hip ADduction  4+/5    Left Hip Flexion  4+/5    Left Hip ABduction  4/5    Left Hip ADduction  4/5    Right/Left Knee  Right;Left    Right Knee Flexion  4+/5    Right Knee Extension  4+/5    Left Knee Flexion  4+/5    Left Knee Extension  4+/5    Right/Left Ankle  Right;Left    Right Ankle Dorsiflexion  4+/5    Right Ankle Plantar Flexion  4+/5    Left Ankle Dorsiflexion  4+/5    Left Ankle Plantar Flexion  4+/5      Palpation   Palpation comment  L lateral glute, piriformis, lateral SI TTP and with soft tissue restriction      Ambulation/Gait   Assistive device  None    Gait Pattern  Step-through pattern;Decreased weight shift to left  Objective measurements completed on examination: See above findings.              PT Education - 03/08/18 1328    Education Details  prognosis, POC, HEP    Person(s) Educated  Patient    Methods  Explanation;Demonstration;Tactile cues;Verbal cues;Handout    Comprehension  Verbalized understanding       PT Short Term  Goals - 03/08/18 1334      PT SHORT TERM GOAL #1   Title  Patient to be independent with initial HEP.    Time  4    Period  Weeks    Status  New    Target Date  04/05/18        PT Long Term Goals - 03/08/18 1335      PT LONG TERM GOAL #1   Title  Patient to be independent with advanced HEP.    Time  8    Period  Weeks    Status  New    Target Date  05/03/18      PT LONG TERM GOAL #2   Title  Patient to demonstrate pain free lumbar AROM.    Time  8    Period  Weeks    Status  New    Target Date  05/03/18      PT LONG TERM GOAL #3   Title  Patient to demonstrate pain free L hip AROM.    Time  8    Period  Weeks    Status  New    Target Date  05/03/18      PT LONG TERM GOAL #4   Title  Patient to demonstrate B LE strength >=4+/5.     Time  8    Period  Weeks    Status  New    Target Date  05/03/18      PT LONG TERM GOAL #5   Title  Patient to report tolerance of 1 hour of walking without pain limiting.     Time  8    Period  Weeks    Status  New    Target Date  05/03/18             Plan - 03/08/18 1329    Clinical Impression Statement  Patient is a 56y/o F presenting to OPPT with c/o L hip pain of 3 months duration. Reports 3 months ago she started walking consistently for exercise and has found that pain comes on after walking about 5 minutes now. Patient has history of LBP and current symptoms include: L sided back pain and tenderness in L buttock which radiates into groin and down HS; also reports that the L hip locks up and affects her gait. Patient today with mild hip weakness, pain with lumbar ROM and hip IR. Also with soft tissue restriction and tenderness in L glute, piriformis, and just lateral to L SI border. Patient educated on gentle stretching and strengthening; reported understanding. Will benefit from skulled PT services to address impairments and return to active lifestyle.     Clinical Presentation  Stable    Clinical Decision Making  Low     Rehab Potential  Good    PT Frequency  2x / week    PT Duration  8 weeks    PT Treatment/Interventions  ADLs/Self Care Home Management;Cryotherapy;Electrical Stimulation;Iontophoresis 4mg /ml Dexamethasone;Moist Heat;Traction;Ultrasound;Gait training;Stair training;Functional mobility training;Therapeutic activities;Therapeutic exercise;Manual techniques;Patient/family education;Balance training;Neuromuscular re-education;Passive range of motion;Dry needling;Energy conservation;Splinting;Taping;Vasopneumatic Device    PT Next Visit  Plan  STM to LB and buttock    Consulted and Agree with Plan of Care  Patient       Patient will benefit from skilled therapeutic intervention in order to improve the following deficits and impairments:  Hypomobility, Decreased activity tolerance, Decreased strength, Pain, Difficulty walking, Decreased mobility, Decreased balance, Decreased range of motion, Improper body mechanics, Postural dysfunction, Impaired flexibility  Visit Diagnosis: Pain in left hip  Chronic left-sided low back pain with left-sided sciatica  Muscle weakness (generalized)  Difficulty in walking, not elsewhere classified  Other symptoms and signs involving the musculoskeletal system     Problem List Patient Active Problem List   Diagnosis Date Noted  . Acute pain of right knee 01/17/2017  . Chronic back pain 07/13/2016  . Allergic rhinitis 03/09/2016  . Headache(784.0) 03/25/2014  . Nausea and vomiting 03/25/2014  . Essential hypertension 02/18/2014  . Encounter for therapeutic drug monitoring 04/26/2013  . Chronic pain syndrome 02/22/2013  . Chronic insomnia 02/22/2013  . Chronic fatigue syndrome 02/22/2013  . Shortness of breath 11/29/2012  . Paresthesias 10/25/2012  . Muscle weakness 10/25/2012  . Malaise and fatigue 10/18/2012  . Spasm of muscle 10/18/2012  . Disturbance of skin sensation 10/18/2012  . Hyperlipidemia 09/18/2012  . Hypokalemia 09/18/2012  . Neuropathy  09/18/2012  . Chest pain 09/17/2012  . CONSTIPATION 10/23/2008    Tammy [name], PT, DPT 03/08/18 1:38 PM    [facility] [address]  [place], [place], [address] Phone: [phone]   Fax:  [phone]  Name: Tammy [name] MRN: [id number] Date of Birth: 30-Aug-1961

## 2018-03-09 DIAGNOSIS — M797 Fibromyalgia: Secondary | ICD-10-CM | POA: Diagnosis not present

## 2018-03-09 DIAGNOSIS — Z1211 Encounter for screening for malignant neoplasm of colon: Secondary | ICD-10-CM | POA: Diagnosis not present

## 2018-03-09 DIAGNOSIS — G4700 Insomnia, unspecified: Secondary | ICD-10-CM | POA: Diagnosis not present

## 2018-03-09 DIAGNOSIS — Z800 Family history of malignant neoplasm of digestive organs: Secondary | ICD-10-CM | POA: Diagnosis not present

## 2018-03-09 DIAGNOSIS — E782 Mixed hyperlipidemia: Secondary | ICD-10-CM | POA: Diagnosis not present

## 2018-03-09 DIAGNOSIS — I10 Essential (primary) hypertension: Secondary | ICD-10-CM | POA: Diagnosis not present

## 2018-03-09 DIAGNOSIS — M25552 Pain in left hip: Secondary | ICD-10-CM | POA: Diagnosis not present

## 2018-03-09 DIAGNOSIS — R41840 Attention and concentration deficit: Secondary | ICD-10-CM | POA: Diagnosis not present

## 2018-03-09 DIAGNOSIS — Z0000 Encounter for general adult medical examination without abnormal findings: Secondary | ICD-10-CM | POA: Diagnosis not present

## 2018-03-10 DIAGNOSIS — Z0000 Encounter for general adult medical examination without abnormal findings: Secondary | ICD-10-CM | POA: Diagnosis not present

## 2018-03-10 DIAGNOSIS — Z1211 Encounter for screening for malignant neoplasm of colon: Secondary | ICD-10-CM | POA: Diagnosis not present

## 2018-03-14 ENCOUNTER — Ambulatory Visit: Payer: 59 | Admitting: Physical Therapy

## 2018-03-14 ENCOUNTER — Encounter: Payer: Self-pay | Admitting: Physical Therapy

## 2018-03-14 DIAGNOSIS — M25552 Pain in left hip: Secondary | ICD-10-CM | POA: Diagnosis not present

## 2018-03-14 DIAGNOSIS — M6281 Muscle weakness (generalized): Secondary | ICD-10-CM | POA: Diagnosis not present

## 2018-03-14 DIAGNOSIS — R29898 Other symptoms and signs involving the musculoskeletal system: Secondary | ICD-10-CM | POA: Diagnosis not present

## 2018-03-14 DIAGNOSIS — M5442 Lumbago with sciatica, left side: Secondary | ICD-10-CM | POA: Diagnosis not present

## 2018-03-14 DIAGNOSIS — G8929 Other chronic pain: Secondary | ICD-10-CM | POA: Diagnosis not present

## 2018-03-14 DIAGNOSIS — R262 Difficulty in walking, not elsewhere classified: Secondary | ICD-10-CM | POA: Diagnosis not present

## 2018-03-14 NOTE — Therapy (Signed)
[facility] [address]  [place], [place], [address] Phone: [phone]   Fax:  [phone]  Physical Therapy Treatment  Patient Details  Name: Tammy [name] MRN: [id number] Date of Birth: 14-May-1961 Referring Provider: Rikki Spearing, DO   Encounter Date: 03/14/2018  PT End of Session - 03/14/18 1137    Visit Number  2    Number of Visits  17    Date for PT Re-Evaluation  05/03/18    Authorization Type  [facility]    PT Start Time  0803    PT Stop Time  0854 moist heat    PT Time Calculation (min)  51 min    Activity Tolerance  Patient tolerated treatment well    Behavior During Therapy  [facility] for tasks assessed/performed       Past Medical History:  Diagnosis Date  . Arthritis   . Chronic fatigue syndrome   . Hypertension   . Neuropathy     Past Surgical History:  Procedure Laterality Date  . CARDIAC CATHETERIZATION  09/18/2012  . LEFT HEART CATHETERIZATION WITH CORONARY ANGIOGRAM N/A 09/18/2012   Procedure: LEFT HEART CATHETERIZATION WITH CORONARY ANGIOGRAM;  Surgeon: Sherren Mocha, MD;  Location: [facility] CATH LAB;  Service: Cardiovascular;  Laterality: N/A;  . TONSILLECTOMY  1965    There were no vitals filed for this visit.  Subjective Assessment - 03/14/18 0805    Subjective  Patient reports she did an errand at [employer] she was limping by the end of it. Did swimming last week and sat in hot tub which helped. Thursday saw MD and they did lumbar xray.     Diagnostic tests  none recent    Patient Stated Goals  walk for an hour, exercise walk, water aerobics    Currently in Pain?  Yes    Pain Score  4     Pain Location  Back    Pain Orientation  Lower;Left    Pain Descriptors / Indicators  Aching                       [facility] Adult PT Treatment/Exercise - 03/14/18 0001      Exercises   Exercises  Knee/Hip;Lumbar      Knee/Hip Exercises: Stretches   ITB Stretch  Left;2 reps;20  seconds;Limitations    ITB Stretch Limitations  L LE standing against wall      Knee/Hip Exercises: Standing   Other Standing Knee Exercises  SKTC L LE 2x20 sec      Knee/Hip Exercises: Supine   Bridges with Clamshell  Strengthening;Both;1 set;10 reps;Limitations red TB around knees      Knee/Hip Exercises: Prone   Other Prone Exercises  donkey kicks 10x B LE      Modalities   Modalities  Moist Heat      Moist Heat Therapy   Number Minutes Moist Heat  10 Minutes    Moist Heat Location  Lumbar Spine      Manual Therapy   Manual Therapy  Soft tissue mobilization;Passive ROM    Soft tissue mobilization  L superior and lateral glute, piriformis, TFL, ITB insertion TTP and soft tissue restriction; self STM to L glute x3 min    Passive ROM  L hip flexor stretch passive; 2x20 sec             PT Education - 03/14/18 1136    Education Details  ergonomic sitting, use of lumbar  roll, use of modalities for pain management     Person(s) Educated  Patient    Methods  Demonstration;Explanation    Comprehension  Verbalized understanding       PT Short Term Goals - 03/08/18 1334      PT SHORT TERM GOAL #1   Title  Patient to be independent with initial HEP.    Time  4    Period  Weeks    Status  New    Target Date  04/05/18        PT Long Term Goals - 03/08/18 1335      PT LONG TERM GOAL #1   Title  Patient to be independent with advanced HEP.    Time  8    Period  Weeks    Status  New    Target Date  05/03/18      PT LONG TERM GOAL #2   Title  Patient to demonstrate pain free lumbar AROM.    Time  8    Period  Weeks    Status  New    Target Date  05/03/18      PT LONG TERM GOAL #3   Title  Patient to demonstrate pain free L hip AROM.    Time  8    Period  Weeks    Status  New    Target Date  05/03/18      PT LONG TERM GOAL #4   Title  Patient to demonstrate B LE strength >=4+/5.     Time  8    Period  Weeks    Status  New    Target Date  05/03/18       PT LONG TERM GOAL #5   Title  Patient to report tolerance of 1 hour of walking without pain limiting.     Time  8    Period  Weeks    Status  New    Target Date  05/03/18            Plan - 03/14/18 1137    Clinical Impression Statement  Patient arrived to session with report of continued increased pain and limping after prolonged walking. Reports compliance with HEP- noting good tolerance of these exercises. Reports that she performed stretching with a theraband that she had already had at home- advised patient not to perform stretching with elastic band but use a sturdy item such as belt, towel, or sheet. Patient requesting standing stretches to perform throughout the day- instructed patient on standing SKTC and TFL stretch against the wall. Also instructed patient on using ball on wall for self-STM to painful areas. Patient tolerated STM to L superior and lateral glute, piriformis, and entre length of TFL down to ITB; patient with soft tissue restriction and marked tenderness to these areas but tolerable. Patient with several questions about ergonomic sitting posture at work- educated patient on using a lumbar roll to improve pelvic positioning throughout the day and to keep 90-90 angle at hips/knees. Patient also with questions about modalities- educated patient on possible use of e-stim, ultrasound, DN in future visits for pain management. Patient agreeable. Patient tolerated hip strengthening exercises this date without c/o pain. Received moist heat to B hips/LB at end of session as patient reports relief with heat. Report of decreased symptoms at end of session.     PT Treatment/Interventions  ADLs/Self Care Home Management;Cryotherapy;Electrical Stimulation;Iontophoresis 4mg /ml Dexamethasone;Moist Heat;Traction;Ultrasound;Gait training;Stair training;Functional mobility training;Therapeutic activities;Therapeutic exercise;Manual techniques;Patient/family education;Balance  training;Neuromuscular re-education;Passive  range of motion;Dry needling;Energy conservation;Splinting;Taping;Vasopneumatic Device    PT Next Visit Plan  STM to LB and buttock, e-stim     Consulted and Agree with Plan of Care  Patient       Patient will benefit from skilled therapeutic intervention in order to improve the following deficits and impairments:  Hypomobility, Decreased activity tolerance, Decreased strength, Pain, Difficulty walking, Decreased mobility, Decreased balance, Decreased range of motion, Improper body mechanics, Postural dysfunction, Impaired flexibility  Visit Diagnosis: Pain in left hip  Chronic left-sided low back pain with left-sided sciatica  Muscle weakness (generalized)  Difficulty in walking, not elsewhere classified  Other symptoms and signs involving the musculoskeletal system     Problem List Patient Active Problem List   Diagnosis Date Noted  . Acute pain of right knee 01/17/2017  . Chronic back pain 07/13/2016  . Allergic rhinitis 03/09/2016  . Headache(784.0) 03/25/2014  . Nausea and vomiting 03/25/2014  . Essential hypertension 02/18/2014  . Encounter for therapeutic drug monitoring 04/26/2013  . Chronic pain syndrome 02/22/2013  . Chronic insomnia 02/22/2013  . Chronic fatigue syndrome 02/22/2013  . Shortness of breath 11/29/2012  . Paresthesias 10/25/2012  . Muscle weakness 10/25/2012  . Malaise and fatigue 10/18/2012  . Spasm of muscle 10/18/2012  . Disturbance of skin sensation 10/18/2012  . Hyperlipidemia 09/18/2012  . Hypokalemia 09/18/2012  . Neuropathy 09/18/2012  . Chest pain 09/17/2012  . CONSTIPATION 10/23/2008     Tammy [name], PT, DPT 03/14/18 11:42 AM    [facility] [address]  [place], [place], [address] Phone: [phone]   Fax:  [phone]  Name: Tammy [name] MRN: [id number] Date of Birth: 1961-08-25

## 2018-03-15 MED FILL — VYVANSE 20 MG CAPSULE: 20 | 30 days supply | Qty: 30 | Fill #0

## 2018-03-16 ENCOUNTER — Encounter: Payer: Self-pay | Admitting: Physical Therapy

## 2018-03-16 ENCOUNTER — Ambulatory Visit: Payer: 59 | Admitting: Physical Therapy

## 2018-03-16 DIAGNOSIS — G8929 Other chronic pain: Secondary | ICD-10-CM

## 2018-03-16 DIAGNOSIS — M6281 Muscle weakness (generalized): Secondary | ICD-10-CM | POA: Diagnosis not present

## 2018-03-16 DIAGNOSIS — R262 Difficulty in walking, not elsewhere classified: Secondary | ICD-10-CM

## 2018-03-16 DIAGNOSIS — M25552 Pain in left hip: Secondary | ICD-10-CM

## 2018-03-16 DIAGNOSIS — R29898 Other symptoms and signs involving the musculoskeletal system: Secondary | ICD-10-CM

## 2018-03-16 DIAGNOSIS — M5442 Lumbago with sciatica, left side: Secondary | ICD-10-CM

## 2018-03-16 NOTE — Therapy (Signed)
[facility] [address]  [place], [place], [address] Phone: [phone]   Fax:  [phone]  Physical Therapy Treatment  Patient Details  Name: Tammy [name] MRN: [id number] Date of Birth: Oct 23, 1960 Referring Provider: Rikki Spearing, DO   Encounter Date: 03/16/2018  PT End of Session - 03/16/18 0937    Visit Number  3    Number of Visits  17    Date for PT Re-Evaluation  05/03/18    Authorization Type  [facility]    PT Start Time  0849    PT Stop Time  0931    PT Time Calculation (min)  42 min    Activity Tolerance  Patient tolerated treatment well    Behavior During Therapy  [facility] for tasks assessed/performed       Past Medical History:  Diagnosis Date  . Arthritis   . Chronic fatigue syndrome   . Hypertension   . Neuropathy     Past Surgical History:  Procedure Laterality Date  . CARDIAC CATHETERIZATION  09/18/2012  . LEFT HEART CATHETERIZATION WITH CORONARY ANGIOGRAM N/A 09/18/2012   Procedure: LEFT HEART CATHETERIZATION WITH CORONARY ANGIOGRAM;  Surgeon: Sherren Mocha, MD;  Location: [facility] CATH LAB;  Service: Cardiovascular;  Laterality: N/A;  . TONSILLECTOMY  1965    There were no vitals filed for this visit.  Subjective Assessment - 03/16/18 0854    Subjective  Patient reports she would like to try DN. Patient did a lot of sitting on Wednesday so had some soreness after that. In more pain today in L hip. Reports sometimes pain will localize to LB.    Pertinent History  R knee pain, chronic back pain, HTN, chronic pain syndrome, chronic fatigue syndrome, paresthesias, HLD, hypokalemia    Diagnostic tests  none recent    Patient Stated Goals  walk for an hour, exercise walk, water aerobics    Currently in Pain?  Yes    Pain Score  6     Pain Location  Buttocks    Pain Orientation  Left;Lower    Pain Radiating Towards  radiating to L hamstring                       [facility] Adult PT  Treatment/Exercise - 03/16/18 0001      Lumbar Exercises: Stretches   Other Lumbar Stretch Exercise  KTOS L LE 2x20"      Lumbar Exercises: Supine   Bridge with clamshell  15 reps;Limitations    Bridge with Ball Squeeze Limitations  red TB around knees      Lumbar Exercises: Prone   Other Prone Lumbar Exercises  prone on elbows; 10x      Knee/Hip Exercises: Stretches   Active Hamstring Stretch  Left;2 reps;20 seconds;Limitations    Active Hamstring Stretch Limitations  propped on stool with hip flexion and DF      Knee/Hip Exercises: Aerobic   Nustep  L4x6 min      Knee/Hip Exercises: Sidelying   Clams  15x each; red TB      Manual Therapy   Manual Therapy  Soft tissue mobilization;Passive ROM    Manual therapy comments  prone/supine    Soft tissue mobilization  L superior and lateral glute, piriformis, TFL, hamstring, hip flexor- TTP and soft tissue restriction    Passive ROM  L hip flexor stretch passive; 2x30 sec  PT Education - 03/16/18 0936    Education Details  Update to HEP, edu on dry needling, sitting ergonomics, hip osteoarthritis symptoms, physical fitness    Person(s) Educated  Patient    Methods  Explanation;Demonstration;Tactile cues;Verbal cues;Handout    Comprehension  Verbalized understanding;Returned demonstration       PT Short Term Goals - 03/16/18 0949      PT SHORT TERM GOAL #1   Title  Patient to be independent with initial HEP.    Time  4    Period  Weeks    Status  Achieved        PT Long Term Goals - 03/08/18 1335      PT LONG TERM GOAL #1   Title  Patient to be independent with advanced HEP.    Time  8    Period  Weeks    Status  New    Target Date  05/03/18      PT LONG TERM GOAL #2   Title  Patient to demonstrate pain free lumbar AROM.    Time  8    Period  Weeks    Status  New    Target Date  05/03/18      PT LONG TERM GOAL #3   Title  Patient to demonstrate pain free L hip AROM.    Time  8    Period   Weeks    Status  New    Target Date  05/03/18      PT LONG TERM GOAL #4   Title  Patient to demonstrate B LE strength >=4+/5.     Time  8    Period  Weeks    Status  New    Target Date  05/03/18      PT LONG TERM GOAL #5   Title  Patient to report tolerance of 1 hour of walking without pain limiting.     Time  8    Period  Weeks    Status  New    Target Date  05/03/18            Plan - 03/16/18 5732    Clinical Impression Statement  Patient arrived to session with report that she did a lot of sitting Wednesday and has a lot of soreness in L buttock and down to hamstring today. Patient with questions about DN- patient educated on benefits of DN. Patient also given handout for ergonomic sitting posture d/t patient's questions last session. Patient with report that she got ahold of her xrays- which shower lumbar herniated discs and hip OA. Educated on symptoms, triggers, and referral patterns of hip OA. Tolerated STM to L superior and lateral glute, piriformis, TFL, hamstring- TTP and considerable soft tissue restriction in these areas. Patient tolerated L passive hip flexor stretch with STM to hip flexor- TTP and restriction as well. Patient tolerated banded resistance hip strengthening this date; given red TB to progress home exercises. Updated HEP for lumbar extension and hamstring stretch to perform at work- reported understanding.     PT Treatment/Interventions  ADLs/Self Care Home Management;Cryotherapy;Electrical Stimulation;Iontophoresis 4mg /ml Dexamethasone;Moist Heat;Traction;Ultrasound;Gait training;Stair training;Functional mobility training;Therapeutic activities;Therapeutic exercise;Manual techniques;Patient/family education;Balance training;Neuromuscular re-education;Passive range of motion;Dry needling;Energy conservation;Splinting;Taping;Vasopneumatic Device    PT Next Visit Plan  continue STM and lumbar extension     Consulted and Agree with Plan of Care  Patient        Patient will benefit from skilled therapeutic intervention in order to improve the following deficits and  impairments:  Hypomobility, Decreased activity tolerance, Decreased strength, Pain, Difficulty walking, Decreased mobility, Decreased balance, Decreased range of motion, Improper body mechanics, Postural dysfunction, Impaired flexibility  Visit Diagnosis: Pain in left hip  Chronic left-sided low back pain with left-sided sciatica  Muscle weakness (generalized)  Difficulty in walking, not elsewhere classified  Other symptoms and signs involving the musculoskeletal system     Problem List Patient Active Problem List   Diagnosis Date Noted  . Acute pain of right knee 01/17/2017  . Chronic back pain 07/13/2016  . Allergic rhinitis 03/09/2016  . Headache(784.0) 03/25/2014  . Nausea and vomiting 03/25/2014  . Essential hypertension 02/18/2014  . Encounter for therapeutic drug monitoring 04/26/2013  . Chronic pain syndrome 02/22/2013  . Chronic insomnia 02/22/2013  . Chronic fatigue syndrome 02/22/2013  . Shortness of breath 11/29/2012  . Paresthesias 10/25/2012  . Muscle weakness 10/25/2012  . Malaise and fatigue 10/18/2012  . Spasm of muscle 10/18/2012  . Disturbance of skin sensation 10/18/2012  . Hyperlipidemia 09/18/2012  . Hypokalemia 09/18/2012  . Neuropathy 09/18/2012  . Chest pain 09/17/2012  . CONSTIPATION 10/23/2008    Janene Harvey, PT, DPT 03/16/18 9:51 AM   [facility] [address]  [place], [place], [address] Phone: [phone]   Fax:  [phone]  Name: Tammy [name] MRN: [id number] Date of Birth: [birth year]

## 2018-03-28 ENCOUNTER — Encounter: Payer: Self-pay | Admitting: Physical Therapy

## 2018-03-28 ENCOUNTER — Ambulatory Visit: Payer: 59 | Attending: Family Medicine | Admitting: Physical Therapy

## 2018-03-28 DIAGNOSIS — R29898 Other symptoms and signs involving the musculoskeletal system: Secondary | ICD-10-CM | POA: Insufficient documentation

## 2018-03-28 DIAGNOSIS — M6281 Muscle weakness (generalized): Secondary | ICD-10-CM | POA: Insufficient documentation

## 2018-03-28 DIAGNOSIS — G8929 Other chronic pain: Secondary | ICD-10-CM | POA: Insufficient documentation

## 2018-03-28 DIAGNOSIS — R262 Difficulty in walking, not elsewhere classified: Secondary | ICD-10-CM | POA: Diagnosis present

## 2018-03-28 DIAGNOSIS — M5442 Lumbago with sciatica, left side: Secondary | ICD-10-CM | POA: Insufficient documentation

## 2018-03-28 DIAGNOSIS — M25552 Pain in left hip: Secondary | ICD-10-CM | POA: Insufficient documentation

## 2018-03-28 NOTE — Therapy (Signed)
[facility] [address]  [place], [place], [address] Phone: [phone]   Fax:  [phone]  Physical Therapy Treatment  Patient Details  Name: Tammy [name] MRN: [id number] Date of Birth: March 02, 1961 Referring Provider: Rikki Spearing, DO   Encounter Date: 03/28/2018  PT End of Session - 03/28/18 0939    Visit Number  4    Number of Visits  17    Date for PT Re-Evaluation  05/03/18    Authorization Type  [facility]    PT Start Time  0803    PT Stop Time  0845    PT Time Calculation (min)  42 min    Activity Tolerance  Patient tolerated treatment well    Behavior During Therapy  [facility] for tasks assessed/performed       Past Medical History:  Diagnosis Date  . Arthritis   . Chronic fatigue syndrome   . Hypertension   . Neuropathy     Past Surgical History:  Procedure Laterality Date  . CARDIAC CATHETERIZATION  09/18/2012  . LEFT HEART CATHETERIZATION WITH CORONARY ANGIOGRAM N/A 09/18/2012   Procedure: LEFT HEART CATHETERIZATION WITH CORONARY ANGIOGRAM;  Surgeon: Sherren Mocha, MD;  Location: [facility] CATH LAB;  Service: Cardiovascular;  Laterality: N/A;  . TONSILLECTOMY  1965    There were no vitals filed for this visit.  Subjective Assessment - 03/28/18 0805    Subjective  Patient reports she was on a trip to visit her mom- travelling in the car and plane and did a lot of walking around the hospital.     Pertinent History  R knee pain, chronic back pain, HTN, chronic pain syndrome, chronic fatigue syndrome, paresthesias, HLD, hypokalemia    Diagnostic tests  none recent    Patient Stated Goals  walk for an hour, exercise walk, water aerobics    Currently in Pain?  Yes    Pain Score  5     Pain Location  Buttocks    Pain Orientation  Left    Pain Descriptors / Indicators  Aching    Pain Type  Chronic pain    Pain Radiating Towards  radiating into hamstring                       [facility] Adult PT  Treatment/Exercise - 03/28/18 0001      Lumbar Exercises: Aerobic   Recumbent Bike  L2x6 min      Lumbar Exercises: Supine   Bridge with clamshell  15 reps;Limitations red TB around knees    Other Supine Lumbar Exercises  B resisted hip flexion w/ red TB; 20x      Knee/Hip Exercises: Stretches   Passive Hamstring Stretch  Both;1 rep;30 seconds;Limitations    Passive Hamstring Stretch Limitations  supine strap    ITB Stretch  Both;1 rep;30 seconds;Limitations    ITB Stretch Limitations  supine strap      Knee/Hip Exercises: Sidelying   Hip ABduction  Strengthening;Both;1 set;10 reps;Limitations    Hip ABduction Limitations  glute med bias; TCs for form    Hip ADduction  Strengthening;Both;1 set;10 reps;Limitations    Hip ADduction Limitations  TCs for form      Knee/Hip Exercises: Prone   Other Prone Exercises  L hip IR/ER 10x; with red TB 10x each LE    Other Prone Exercises  Prone donkey kicks 10x each LE      Manual Therapy   Manual Therapy  Soft tissue mobilization;Passive ROM    Manual therapy comments  prone    Soft tissue mobilization  L superior and lateral glute, piriformis, B TFL, B hamstring TTP and soft tissue restriction    Passive ROM  --               PT Short Term Goals - 03/16/18 0949      PT SHORT TERM GOAL #1   Title  Patient to be independent with initial HEP.    Time  4    Period  Weeks    Status  Achieved        PT Long Term Goals - 03/08/18 1335      PT LONG TERM GOAL #1   Title  Patient to be independent with advanced HEP.    Time  8    Period  Weeks    Status  New    Target Date  05/03/18      PT LONG TERM GOAL #2   Title  Patient to demonstrate pain free lumbar AROM.    Time  8    Period  Weeks    Status  New    Target Date  05/03/18      PT LONG TERM GOAL #3   Title  Patient to demonstrate pain free L hip AROM.    Time  8    Period  Weeks    Status  New    Target Date  05/03/18      PT LONG TERM GOAL #4   Title   Patient to demonstrate B LE strength >=4+/5.     Time  8    Period  Weeks    Status  New    Target Date  05/03/18      PT LONG TERM GOAL #5   Title  Patient to report tolerance of 1 hour of walking without pain limiting.     Time  8    Period  Weeks    Status  New    Target Date  05/03/18            Plan - 03/28/18 3300    Clinical Impression Statement  Patient arrived to session with report that she was on a trip where she did a lot of traveling and walking. Reports she is "back to square one" and was not able to complete HEP during this trip d/t family obligations. Tolerated STM to L glute and piriformis, B TFL, B HS- TTP and soft tissue restriction in these areas with intermittent muscle spasms noted in B distal HS. Patient tolerated progressive hip strengthening ther-ex without c/o hip pain. Required intermittent VC/TCs to correct technique. Offered patient e-stim at end of session for pain relief- patient declined. Reported she has a TENS unit that she can use at work. Educated patient on electrode placement for pain relief. Patient willing to try DN in future sessions. Advised patient to get back into her HEP routine and monitor LB/L hip symptoms.     PT Treatment/Interventions  ADLs/Self Care Home Management;Cryotherapy;Electrical Stimulation;Iontophoresis 4mg /ml Dexamethasone;Moist Heat;Traction;Ultrasound;Gait training;Stair training;Functional mobility training;Therapeutic activities;Therapeutic exercise;Manual techniques;Patient/family education;Balance training;Neuromuscular re-education;Passive range of motion;Dry needling;Energy conservation;Splinting;Taping;Vasopneumatic Device    PT Next Visit Plan  possible DN next session    Consulted and Agree with Plan of Care  Patient       Patient will benefit from skilled therapeutic intervention in order to improve the following deficits and impairments:  Hypomobility, Decreased activity tolerance, Decreased strength, Pain,  Difficulty walking, Decreased mobility, Decreased balance, Decreased range of motion, Improper body mechanics, Postural dysfunction, Impaired flexibility  Visit Diagnosis: Pain in left hip  Chronic left-sided low back pain with left-sided sciatica  Muscle weakness (generalized)  Difficulty in walking, not elsewhere classified  Other symptoms and signs involving the musculoskeletal system     Problem List Patient Active Problem List   Diagnosis Date Noted  . Acute pain of right knee 01/17/2017  . Chronic back pain 07/13/2016  . Allergic rhinitis 03/09/2016  . Headache(784.0) 03/25/2014  . Nausea and vomiting 03/25/2014  . Essential hypertension 02/18/2014  . Encounter for therapeutic drug monitoring 04/26/2013  . Chronic pain syndrome 02/22/2013  . Chronic insomnia 02/22/2013  . Chronic fatigue syndrome 02/22/2013  . Shortness of breath 11/29/2012  . Paresthesias 10/25/2012  . Muscle weakness 10/25/2012  . Malaise and fatigue 10/18/2012  . Spasm of muscle 10/18/2012  . Disturbance of skin sensation 10/18/2012  . Hyperlipidemia 09/18/2012  . Hypokalemia 09/18/2012  . Neuropathy 09/18/2012  . Chest pain 09/17/2012  . CONSTIPATION 10/23/2008    Janene Harvey, PT, DPT 03/28/18 9:45 AM   [facility] [address]  [place], [place], [address] Phone: [phone]   Fax:  [phone]  Name: NAYARA TAPLIN MRN: [id number] Date of Birth: July 26, 1961

## 2018-03-31 ENCOUNTER — Ambulatory Visit: Payer: 59 | Admitting: Physical Therapy

## 2018-04-04 ENCOUNTER — Ambulatory Visit: Payer: 59

## 2018-04-04 DIAGNOSIS — M5442 Lumbago with sciatica, left side: Secondary | ICD-10-CM

## 2018-04-04 DIAGNOSIS — M6281 Muscle weakness (generalized): Secondary | ICD-10-CM

## 2018-04-04 DIAGNOSIS — R262 Difficulty in walking, not elsewhere classified: Secondary | ICD-10-CM

## 2018-04-04 DIAGNOSIS — M25552 Pain in left hip: Secondary | ICD-10-CM

## 2018-04-04 DIAGNOSIS — G8929 Other chronic pain: Secondary | ICD-10-CM

## 2018-04-04 DIAGNOSIS — R29898 Other symptoms and signs involving the musculoskeletal system: Secondary | ICD-10-CM | POA: Diagnosis not present

## 2018-04-04 NOTE — Patient Instructions (Addendum)

## 2018-04-04 NOTE — Therapy (Signed)
[facility] [address]  [place], [place], [address] Phone: [phone]   Fax:  [phone]  Physical Therapy Treatment  Patient Details  Name: Tammy [name] MRN: [id number] Date of Birth: 1961-03-13 Referring Provider: Rikki Spearing, DO   Encounter Date: 04/04/2018  PT End of Session - 04/04/18 0809    Visit Number  5    Number of Visits  17    Date for PT Re-Evaluation  05/03/18    Authorization Type  [facility]    PT Start Time  0805    PT Stop Time  0855 Ended session with 5 min moist heat    PT Time Calculation (min)  50 min    Activity Tolerance  Patient tolerated treatment well    Behavior During Therapy  [facility] for tasks assessed/performed       Past Medical History:  Diagnosis Date  . Arthritis   . Chronic fatigue syndrome   . Hypertension   . Neuropathy     Past Surgical History:  Procedure Laterality Date  . CARDIAC CATHETERIZATION  09/18/2012  . LEFT HEART CATHETERIZATION WITH CORONARY ANGIOGRAM N/A 09/18/2012   Procedure: LEFT HEART CATHETERIZATION WITH CORONARY ANGIOGRAM;  Surgeon: Sherren Mocha, MD;  Location: [facility] CATH LAB;  Service: Cardiovascular;  Laterality: N/A;  . TONSILLECTOMY  1965    There were no vitals filed for this visit.  Subjective Assessment - 04/04/18 0811    Subjective  Pt. reporting HEP going well however has not attempted ball release to buttocks on wall yet.      Pertinent History  R knee pain, chronic back pain, HTN, chronic pain syndrome, chronic fatigue syndrome, paresthesias, HLD, hypokalemia    Patient Stated Goals  walk for an hour, exercise walk, water aerobics    Currently in Pain?  Yes    Pain Score  4     Pain Location  Buttocks    Pain Orientation  Left    Pain Descriptors / Indicators  Aching    Pain Type  Chronic pain    Pain Radiating Towards  radiating into hamstring and lower back     Pain Onset  More than a month ago    Pain Relieving Factors  KTOS                         [facility] Adult PT Treatment/Exercise - 04/04/18 0838      Self-Care   Self-Care  Other Self-Care Comments    Other Self-Care Comments   glute self-release with tennis ball on wall for home performance       Lumbar Exercises: Aerobic   Nustep  Lvl 6, 7 min       Lumbar Exercises: Supine   Bridge with clamshell  15 reps;Limitations with red TB isometric abd/ER at knees       Knee/Hip Exercises: Stretches   Passive Hamstring Stretch  Both;1 rep;30 seconds;Limitations    Passive Hamstring Stretch Limitations  supine strap     Piriformis Stretch  Left;3 reps;30 seconds    Piriformis Stretch Limitations  Seated and supine KTOS      Knee/Hip Exercises: Standing   Hip Abduction  Right;Left;Stengthening;10 reps;Knee straight    Abduction Limitations  with yellow TB at ankles     Hip Extension  Right;Left;10 reps;Knee straight;Stengthening    Extension Limitations  yellow TB at ankles 45 dg kickback     Functional Squat  15  reps;3 seconds Cues provided to encourage "glute squeeze" prior to "up"    Functional Squat Limitations  with yellow looped TB at knees; cues required for sustained hip abd/ER into looped band      Knee/Hip Exercises: Sidelying   Clams  15x L; yellow TB at knees      Manual Therapy   Manual Therapy  Soft tissue mobilization;Passive ROM;Myofascial release    Manual therapy comments  R sidelying with L LE elevated on bolster     Soft tissue mobilization  L glute/piriformis, L lateral mid HS, L lateral mid quad (in area of point tenderness)    Myofascial Release  TPR to L mid lateral quad; very ttp; TPR to L piriformis (medial); pt. noting relief following this              PT Education - 04/04/18 0905    Education Details  DN eductional handout;  HEP update with yellow looped TB issued to pt.     Person(s) Educated  Patient    Methods  Explanation;Demonstration;Verbal cues;Handout    Comprehension  Verbalized  understanding;Returned demonstration;Verbal cues required;Need further instruction       PT Short Term Goals - 03/16/18 0949      PT SHORT TERM GOAL #1   Title  Patient to be independent with initial HEP.    Time  4    Period  Weeks    Status  Achieved        PT Long Term Goals - 04/04/18 0809      PT LONG TERM GOAL #1   Title  Patient to be independent with advanced HEP.    Time  8    Period  Weeks    Status  On-going      PT LONG TERM GOAL #2   Title  Patient to demonstrate pain free lumbar AROM.    Time  8    Period  Weeks    Status  On-going      PT LONG TERM GOAL #3   Title  Patient to demonstrate pain free L hip AROM.    Time  8    Period  Weeks    Status  On-going      PT LONG TERM GOAL #4   Title  Patient to demonstrate B LE strength >=4+/5.     Time  8    Period  Weeks    Status  On-going      PT LONG TERM GOAL #5   Title  Patient to report tolerance of 1 hour of walking without pain limiting.     Time  8    Period  Weeks    Status  On-going            Plan - 04/04/18 0810    Clinical Impression Statement  [place] seen to start session noting slight decrease in subjective pain reports with pain primarily in L buttocks and radiating down L HS area today.  Pt. with tenderness in L lateral LE and glute musculature, which was addressed with manual therapy and proximal hip strengthening therex.  Pt. noting some relief following this and ended session with moist heat to L buttocks x 5 min per pt. request as she needed to leave early as not to be late for work shift.  Did discuss possibility of DN today along with DN handout issued to pt.   Pt. noting she would rather consider DN for future visits and may consider trial of  K-taping to L proximal hip musculature pending response at upcoming visit.      PT Treatment/Interventions  ADLs/Self Care Home Management;Cryotherapy;Electrical Stimulation;Iontophoresis 4mg /ml Dexamethasone;Moist  Heat;Traction;Ultrasound;Gait training;Stair training;Functional mobility training;Therapeutic activities;Therapeutic exercise;Manual techniques;Patient/family education;Balance training;Neuromuscular re-education;Passive range of motion;Dry needling;Energy conservation;Splinting;Taping;Vasopneumatic Device    Consulted and Agree with Plan of Care  Patient       Patient will benefit from skilled therapeutic intervention in order to improve the following deficits and impairments:  Hypomobility, Decreased activity tolerance, Decreased strength, Pain, Difficulty walking, Decreased mobility, Decreased balance, Decreased range of motion, Improper body mechanics, Postural dysfunction, Impaired flexibility  Visit Diagnosis: Pain in left hip  Chronic left-sided low back pain with left-sided sciatica  Muscle weakness (generalized)  Difficulty in walking, not elsewhere classified  Other symptoms and signs involving the musculoskeletal system     Problem List Patient Active Problem List   Diagnosis Date Noted  . Acute pain of right knee 01/17/2017  . Chronic back pain 07/13/2016  . Allergic rhinitis 03/09/2016  . Headache(784.0) 03/25/2014  . Nausea and vomiting 03/25/2014  . Essential hypertension 02/18/2014  . Encounter for therapeutic drug monitoring 04/26/2013  . Chronic pain syndrome 02/22/2013  . Chronic insomnia 02/22/2013  . Chronic fatigue syndrome 02/22/2013  . Shortness of breath 11/29/2012  . Paresthesias 10/25/2012  . Muscle weakness 10/25/2012  . Malaise and fatigue 10/18/2012  . Spasm of muscle 10/18/2012  . Disturbance of skin sensation 10/18/2012  . Hyperlipidemia 09/18/2012  . Hypokalemia 09/18/2012  . Neuropathy 09/18/2012  . Chest pain 09/17/2012  . CONSTIPATION 10/23/2008    Bess Harvest, PTA 04/04/18 9:25 AM  [place] [place] [address]  [place], [place], [address] Phone: [phone]   Fax:   [phone]  Name: Tammy [name] MRN: [id number] Date of Birth: 1961/04/16

## 2018-04-07 ENCOUNTER — Ambulatory Visit: Payer: 59

## 2018-04-11 ENCOUNTER — Ambulatory Visit: Payer: 59

## 2018-04-11 DIAGNOSIS — G8929 Other chronic pain: Secondary | ICD-10-CM

## 2018-04-11 DIAGNOSIS — M6281 Muscle weakness (generalized): Secondary | ICD-10-CM | POA: Diagnosis not present

## 2018-04-11 DIAGNOSIS — M5442 Lumbago with sciatica, left side: Secondary | ICD-10-CM | POA: Diagnosis not present

## 2018-04-11 DIAGNOSIS — R262 Difficulty in walking, not elsewhere classified: Secondary | ICD-10-CM

## 2018-04-11 DIAGNOSIS — M25552 Pain in left hip: Secondary | ICD-10-CM | POA: Diagnosis not present

## 2018-04-11 DIAGNOSIS — R29898 Other symptoms and signs involving the musculoskeletal system: Secondary | ICD-10-CM | POA: Diagnosis not present

## 2018-04-11 NOTE — Therapy (Signed)
[facility] [address]  [place], [place], [address] Phone: [phone]   Fax:  [phone]  Physical Therapy Treatment  Patient Details  Name: Tammy [name] MRN: [id number] Date of Birth: 10-Jul-1961 Referring Provider: Rikki Spearing, DO   Encounter Date: 04/11/2018  PT End of Session - 04/11/18 0812    Visit Number  6    Number of Visits  17    Date for PT Re-Evaluation  05/03/18    Authorization Type  [facility]    PT Start Time  0804    PT Stop Time  0854    PT Time Calculation (min)  50 min    Activity Tolerance  Patient tolerated treatment well    Behavior During Therapy  [facility] for tasks assessed/performed       Past Medical History:  Diagnosis Date  . Arthritis   . Chronic fatigue syndrome   . Hypertension   . Neuropathy     Past Surgical History:  Procedure Laterality Date  . CARDIAC CATHETERIZATION  09/18/2012  . LEFT HEART CATHETERIZATION WITH CORONARY ANGIOGRAM N/A 09/18/2012   Procedure: LEFT HEART CATHETERIZATION WITH CORONARY ANGIOGRAM;  Surgeon: Sherren Mocha, MD;  Location: [facility] CATH LAB;  Service: Cardiovascular;  Laterality: N/A;  . TONSILLECTOMY  1965    There were no vitals filed for this visit.  Subjective Assessment - 04/11/18 0808    Subjective  Pt. noting some increased buttocks pain after yardwork.      Pertinent History  R knee pain, chronic back pain, HTN, chronic pain syndrome, chronic fatigue syndrome, paresthesias, HLD, hypokalemia    Diagnostic tests  none recent    Patient Stated Goals  walk for an hour, exercise walk, water aerobics    Currently in Pain?  Yes    Pain Score  3  up to 5/10     Pain Location  Buttocks    Pain Orientation  Left    Pain Descriptors / Indicators  Aching    Pain Type  Chronic pain    Pain Radiating Towards  radiating into hamstring     Pain Onset  More than a month ago    Pain Frequency  Constant    Pain Relieving Factors  supine, KTOS     Multiple Pain Sites  No                       [facility] Adult PT Treatment/Exercise - 04/11/18 0823      Self-Care   Self-Care  Other Self-Care Comments    Other Self-Care Comments   review of glute self-release with ball on wall as pt. unsure of proper positioning and pressure       Lumbar Exercises: Stretches   Other Lumbar Stretch Exercise  L sciatic nerve glide x 20 reps       Lumbar Exercises: Aerobic   Recumbent Bike  L2x6 min      Lumbar Exercises: Supine   Other Supine Lumbar Exercises  Hooklying bridge with alternating hip abd/ER x 2 at top of motion x 15 reps      Knee/Hip Exercises: Stretches   Piriformis Stretch  Left;3 reps;30 seconds    Piriformis Stretch Limitations  KTOS      Knee/Hip Exercises: Standing   Functional Squat  15 reps;5 seconds    Functional Squat Limitations  red TB at knees with cueing to maintain hip ER    Other Standing Knee Exercises  Side stepping with red TB at forefoot 2 x 25 ft       Knee/Hip Exercises: Sidelying   Clams  20x L; yellow TB at knees      Manual Therapy   Manual Therapy  Soft tissue mobilization;Passive ROM;Myofascial release;Taping    Manual therapy comments  R sidelying with L LE elevated on bolster     Soft tissue mobilization  L glute/piriformis; ttp with increased tension     Myofascial Release  TPR to L piriformis/glute; point tenderness     Kinesiotex  Create Space      Kinesiotix   Create Space  L piriformis taping pattern (30% stretch on star pattern over point of most tenderness)               PT Short Term Goals - 03/16/18 0949      PT SHORT TERM GOAL #1   Title  Patient to be independent with initial HEP.    Time  4    Period  Weeks    Status  Achieved        PT Long Term Goals - 04/04/18 0809      PT LONG TERM GOAL #1   Title  Patient to be independent with advanced HEP.    Time  8    Period  Weeks    Status  On-going      PT LONG TERM GOAL #2   Title  Patient to  demonstrate pain free lumbar AROM.    Time  8    Period  Weeks    Status  On-going      PT LONG TERM GOAL #3   Title  Patient to demonstrate pain free L hip AROM.    Time  8    Period  Weeks    Status  On-going      PT LONG TERM GOAL #4   Title  Patient to demonstrate B LE strength >=4+/5.     Time  8    Period  Weeks    Status  On-going      PT LONG TERM GOAL #5   Title  Patient to report tolerance of 1 hour of walking without pain limiting.     Time  8    Period  Weeks    Status  On-going            Plan - 04/11/18 0813    Clinical Impression Statement  Tammy [name] reporting she felt good relief from last visit until, "Overdoing it with yardwork on Sunday".  Did discuss using stool and forward support with UE with prolonged weeding to reduce hip and lumbar strain.  Tammy [name] noting increased R buttocks pain which radiates down L LE since Sunday, which was addressed with manual therapy today with good relief.  Duration of session focused on proximal hip strengthening as to further relieve muscular tension in this area.  HEP updated.  Ended session with trial of piriformis taping pattern for hopeful improvement in comfort and reduction in muscular tone.  Will monitor response in coming visits.      PT Treatment/Interventions  ADLs/Self Care Home Management;Cryotherapy;Electrical Stimulation;Iontophoresis 4mg /ml Dexamethasone;Moist Heat;Traction;Ultrasound;Gait training;Stair training;Functional mobility training;Therapeutic activities;Therapeutic exercise;Manual techniques;Patient/family education;Balance training;Neuromuscular re-education;Passive range of motion;Dry needling;Energy conservation;Splinting;Taping;Vasopneumatic Device    Consulted and Agree with Plan of Care  Patient       Patient will benefit from skilled therapeutic intervention in order to improve the following deficits and impairments:  Hypomobility, Decreased activity tolerance, Decreased  strength, Pain, Difficulty  walking, Decreased mobility, Decreased balance, Decreased range of motion, Improper body mechanics, Postural dysfunction, Impaired flexibility  Visit Diagnosis: Pain in left hip  Chronic left-sided low back pain with left-sided sciatica  Muscle weakness (generalized)  Difficulty in walking, not elsewhere classified  Other symptoms and signs involving the musculoskeletal system     Problem List Patient Active Problem List   Diagnosis Date Noted  . Acute pain of right knee 01/17/2017  . Chronic back pain 07/13/2016  . Allergic rhinitis 03/09/2016  . Headache(784.0) 03/25/2014  . Nausea and vomiting 03/25/2014  . Essential hypertension 02/18/2014  . Encounter for therapeutic drug monitoring 04/26/2013  . Chronic pain syndrome 02/22/2013  . Chronic insomnia 02/22/2013  . Chronic fatigue syndrome 02/22/2013  . Shortness of breath 11/29/2012  . Paresthesias 10/25/2012  . Muscle weakness 10/25/2012  . Malaise and fatigue 10/18/2012  . Spasm of muscle 10/18/2012  . Disturbance of skin sensation 10/18/2012  . Hyperlipidemia 09/18/2012  . Hypokalemia 09/18/2012  . Neuropathy 09/18/2012  . Chest pain 09/17/2012  . CONSTIPATION 10/23/2008    Bess Harvest, PTA 04/11/18 10:59 AM   [facility] [address]  [place], [place], [address] Phone: [phone]   Fax:  [phone]  Name: Tammy [name] MRN: [id number] Date of Birth: 1961/08/28

## 2018-04-14 ENCOUNTER — Ambulatory Visit: Payer: 59 | Admitting: Physical Therapy

## 2018-04-14 ENCOUNTER — Encounter: Payer: Self-pay | Admitting: Physical Therapy

## 2018-04-14 DIAGNOSIS — M6281 Muscle weakness (generalized): Secondary | ICD-10-CM | POA: Diagnosis not present

## 2018-04-14 DIAGNOSIS — R262 Difficulty in walking, not elsewhere classified: Secondary | ICD-10-CM | POA: Diagnosis not present

## 2018-04-14 DIAGNOSIS — M5442 Lumbago with sciatica, left side: Secondary | ICD-10-CM | POA: Diagnosis not present

## 2018-04-14 DIAGNOSIS — R29898 Other symptoms and signs involving the musculoskeletal system: Secondary | ICD-10-CM | POA: Diagnosis not present

## 2018-04-14 DIAGNOSIS — G8929 Other chronic pain: Secondary | ICD-10-CM | POA: Diagnosis not present

## 2018-04-14 DIAGNOSIS — M25552 Pain in left hip: Secondary | ICD-10-CM

## 2018-04-14 NOTE — Therapy (Signed)
[facility] [address]  [place], [place], [address] Phone: [phone]   Fax:  [phone]  Physical Therapy Treatment  Patient Details  Name: Tammy [name] MRN: [id number] Date of Birth: 1961-01-01 Referring Provider: Rikki Spearing, DO   Encounter Date: 04/14/2018  PT End of Session - 04/14/18 1129    Visit Number  7    Number of Visits  17    Date for PT Re-Evaluation  05/03/18    Authorization Type  [facility]    PT Start Time  0806    PT Stop Time  0848    PT Time Calculation (min)  42 min    Activity Tolerance  Patient tolerated treatment well    Behavior During Therapy  [facility] for tasks assessed/performed       Past Medical History:  Diagnosis Date  . Arthritis   . Chronic fatigue syndrome   . Hypertension   . Neuropathy     Past Surgical History:  Procedure Laterality Date  . CARDIAC CATHETERIZATION  09/18/2012  . LEFT HEART CATHETERIZATION WITH CORONARY ANGIOGRAM N/A 09/18/2012   Procedure: LEFT HEART CATHETERIZATION WITH CORONARY ANGIOGRAM;  Surgeon: Sherren Mocha, MD;  Location: [facility] CATH LAB;  Service: Cardiovascular;  Laterality: N/A;  . TONSILLECTOMY  1965    There were no vitals filed for this visit.  Subjective Assessment - 04/14/18 0806    Subjective  Reports L hip pain has been up and down. Today in more pain after going to the pool yesterday. Had burning pain in L hip/buttocks.    Pertinent History  R knee pain, chronic back pain, HTN, chronic pain syndrome, chronic fatigue syndrome, paresthesias, HLD, hypokalemia    Patient Stated Goals  walk for an hour, exercise walk, water aerobics    Currently in Pain?  Yes    Pain Score  5     Pain Location  Buttocks    Pain Orientation  Left    Pain Descriptors / Indicators  Aching    Pain Type  Chronic pain                       [facility] Adult PT Treatment/Exercise - 04/14/18 0001      Lumbar Exercises: Stretches   Passive  Hamstring Stretch  Left;1 rep;30 seconds;Limitations    Passive Hamstring Stretch Limitations  supine strap    ITB Stretch  Left;1 rep;30 seconds;Limitations    ITB Stretch Limitations  supine strap      Lumbar Exercises: Aerobic   Nustep  L4x6 min      Knee/Hip Exercises: Standing   Hip Flexion  Stengthening;Both;1 set;15 reps;Knee bent;Limitations    Hip Flexion Limitations  red TB around toes; B UE counter support; VCs to avoid hip drop    Other Standing Knee Exercises  Side stepping with red TB at forefoot 2 x 25 ft  VCs to avoid lateral trunk lean      Knee/Hip Exercises: Sidelying   Hip ABduction  Strengthening;1 set;Limitations;15 reps;Left      Knee/Hip Exercises: Prone   Other Prone Exercises  L hip IR/ER 10x; with yellow TB 10x each LE    Other Prone Exercises  Prone donkey kicks + ABD 10x each LE      Manual Therapy   Manual Therapy  Soft tissue mobilization;Passive ROM;Myofascial release;Taping    Manual therapy comments  R sidelying with L LE elevated on bolster  Soft tissue mobilization  L piriformis and proximal HS; ttp with increased tension     Myofascial Release  TPR to L piriformis/glute; point tenderness     Kinesiotex  Create Space      Kinesiotix   Create Space  L piriformis taping pattern (30% stretch for X pattern over piriformis)             PT Education - 04/14/18 1127    Education Details  Reminded on kinesiotape precautions, removal, wear time; given info on purchasing tape for home use    Person(s) Educated  Patient    Methods  Explanation;Demonstration    Comprehension  Verbalized understanding       PT Short Term Goals - 03/16/18 0949      PT SHORT TERM GOAL #1   Title  Patient to be independent with initial HEP.    Time  4    Period  Weeks    Status  Achieved        PT Long Term Goals - 04/04/18 0809      PT LONG TERM GOAL #1   Title  Patient to be independent with advanced HEP.    Time  8    Period  Weeks    Status   On-going      PT LONG TERM GOAL #2   Title  Patient to demonstrate pain free lumbar AROM.    Time  8    Period  Weeks    Status  On-going      PT LONG TERM GOAL #3   Title  Patient to demonstrate pain free L hip AROM.    Time  8    Period  Weeks    Status  On-going      PT LONG TERM GOAL #4   Title  Patient to demonstrate B LE strength >=4+/5.     Time  8    Period  Weeks    Status  On-going      PT LONG TERM GOAL #5   Title  Patient to report tolerance of 1 hour of walking without pain limiting.     Time  8    Period  Weeks    Status  On-going            Plan - 04/14/18 1129    Clinical Impression Statement  Patient arrived to session with report of fluctuating L hip symptoms. Reports uncertainty of activities that will/will not flare up L LE. Suggested patient keep a journal of activities performed and subsequent pain response. Advised not to push through painful activities to avoid flare up. Patient reported understanding. Patient reporting benefit from taping and STM. Significant soft tissue restriction and tenderness to L piriformis and proximal HS; report of symptom improvement. No c/o pain with proximal hip strengthening activities. Intermittently given cues for form. Received kinesiotape to L piriformis for continued pain relief. Reviewed precautions and removal/wear time with patient. Patient reported understanding.     PT Treatment/Interventions  ADLs/Self Care Home Management;Cryotherapy;Electrical Stimulation;Iontophoresis 4mg /ml Dexamethasone;Moist Heat;Traction;Ultrasound;Gait training;Stair training;Functional mobility training;Therapeutic activities;Therapeutic exercise;Manual techniques;Patient/family education;Balance training;Neuromuscular re-education;Passive range of motion;Dry needling;Energy conservation;Splinting;Taping;Vasopneumatic Device    Consulted and Agree with Plan of Care  Patient       Patient will benefit from skilled therapeutic intervention  in order to improve the following deficits and impairments:  Hypomobility, Decreased activity tolerance, Decreased strength, Pain, Difficulty walking, Decreased mobility, Decreased balance, Decreased range of motion, Improper body mechanics, Postural dysfunction, Impaired flexibility  Visit Diagnosis: Pain in left hip  Chronic left-sided low back pain with left-sided sciatica  Muscle weakness (generalized)  Difficulty in walking, not elsewhere classified  Other symptoms and signs involving the musculoskeletal system     Problem List Patient Active Problem List   Diagnosis Date Noted  . Acute pain of right knee 01/17/2017  . Chronic back pain 07/13/2016  . Allergic rhinitis 03/09/2016  . Headache(784.0) 03/25/2014  . Nausea and vomiting 03/25/2014  . Essential hypertension 02/18/2014  . Encounter for therapeutic drug monitoring 04/26/2013  . Chronic pain syndrome 02/22/2013  . Chronic insomnia 02/22/2013  . Chronic fatigue syndrome 02/22/2013  . Shortness of breath 11/29/2012  . Paresthesias 10/25/2012  . Muscle weakness 10/25/2012  . Malaise and fatigue 10/18/2012  . Spasm of muscle 10/18/2012  . Disturbance of skin sensation 10/18/2012  . Hyperlipidemia 09/18/2012  . Hypokalemia 09/18/2012  . Neuropathy 09/18/2012  . Chest pain 09/17/2012  . CONSTIPATION 10/23/2008     Janene Harvey, PT, DPT 04/14/18 11:37 AM   [facility] [address]  [place], [place], [address] Phone: [phone]   Fax:  [phone]  Name: VEONA BITTMAN MRN: [id number] Date of Birth: 1960/12/07

## 2018-04-18 ENCOUNTER — Ambulatory Visit: Payer: 59

## 2018-04-18 DIAGNOSIS — R29898 Other symptoms and signs involving the musculoskeletal system: Secondary | ICD-10-CM | POA: Diagnosis not present

## 2018-04-18 DIAGNOSIS — R262 Difficulty in walking, not elsewhere classified: Secondary | ICD-10-CM | POA: Diagnosis not present

## 2018-04-18 DIAGNOSIS — G8929 Other chronic pain: Secondary | ICD-10-CM

## 2018-04-18 DIAGNOSIS — M25552 Pain in left hip: Secondary | ICD-10-CM

## 2018-04-18 DIAGNOSIS — M5442 Lumbago with sciatica, left side: Secondary | ICD-10-CM | POA: Diagnosis not present

## 2018-04-18 DIAGNOSIS — M6281 Muscle weakness (generalized): Secondary | ICD-10-CM | POA: Diagnosis not present

## 2018-04-18 MED FILL — VYVANSE 20 MG CAPSULE: 20 | 30 days supply | Qty: 30 | Fill #0

## 2018-04-18 NOTE — Therapy (Addendum)
[facility] [address]  [place], [place], [address] Phone: [phone]   Fax:  [phone]  Physical Therapy Treatment  Patient Details  Name: Tammy [name] MRN: [id number] Date of Birth: 1961-02-23 Referring Provider: Rikki Spearing, DO   Encounter Date: 04/18/2018  PT End of Session - 04/18/18 0809    Visit Number  8    Number of Visits  17    Date for PT Re-Evaluation  05/03/18    Authorization Type  [facility]    PT Start Time  0806    PT Stop Time  0859    PT Time Calculation (min)  53 min    Activity Tolerance  Patient tolerated treatment well    Behavior During Therapy  [facility] for tasks assessed/performed       Past Medical History:  Diagnosis Date  . Arthritis   . Chronic fatigue syndrome   . Hypertension   . Neuropathy     Past Surgical History:  Procedure Laterality Date  . CARDIAC CATHETERIZATION  09/18/2012  . LEFT HEART CATHETERIZATION WITH CORONARY ANGIOGRAM N/A 09/18/2012   Procedure: LEFT HEART CATHETERIZATION WITH CORONARY ANGIOGRAM;  Surgeon: Sherren Mocha, MD;  Location: [facility] CATH LAB;  Service: Cardiovascular;  Laterality: N/A;  . TONSILLECTOMY  1965    There were no vitals filed for this visit.  Subjective Assessment - 04/18/18 0808    Subjective  Pt. noting increased symptoms over weekend and onset of R LE radiating pain into R lateral/posterior thigh.  Admits to walking a fast 30-min walk over weekend with daughter on Saturday and Sunday performing some squatting with yardwork.      Pertinent History  R knee pain, chronic back pain, HTN, chronic pain syndrome, chronic fatigue syndrome, paresthesias, HLD, hypokalemia    Diagnostic tests  none recent    Patient Stated Goals  walk for an hour, exercise walk, water aerobics    Currently in Pain?  Yes    Pain Score  4     Pain Location  Buttocks    Pain Orientation  Left;Right    Pain Descriptors / Indicators  Aching    Pain Type  Chronic  pain    Pain Radiating Towards  Radiating into R hamstring     Pain Onset  More than a month ago    Pain Frequency  Constant    Aggravating Factors   long walks    Multiple Pain Sites  No                       [facility] Adult PT Treatment/Exercise - 04/18/18 0834      Lumbar Exercises: Stretches   Other Lumbar Stretch Exercise  B LE sciatic nerve glide x 15 reps       Lumbar Exercises: Aerobic   Recumbent Bike  L2x6 min      Lumbar Exercises: Supine   Other Supine Lumbar Exercises  Hooklying bridge + alteranting hip abd/ER with red TB at knees x 1 each LE x 15 reps       Knee/Hip Exercises: Stretches   Passive Hamstring Stretch  Right;Left;20 seconds;1 rep    Passive Hamstring Stretch Limitations  with strap     Piriformis Stretch  Right;Left;30 seconds;2 reps    Piriformis Stretch Limitations  KTOS, Figure-4      Knee/Hip Exercises: Standing   Functional Squat  15 reps;5 seconds Cues to squeeze glutes  Functional Squat Limitations  red TB at knees with cueing to maintain hip ER    Other Standing Knee Exercises  Side stepping, backwards monster walk with red TB at forefoot 2 x 30 ft       Knee/Hip Exercises: Sidelying   Clams  B with red TB at knees x 10 reps      Modalities   Modalities  Electrical Stimulation      Moist Heat Therapy   Number Minutes Moist Heat  10 Minutes    Moist Heat Location  Hip B buttocks in prone      Electrical Stimulation   Electrical Stimulation Location  B buttocks     Electrical Stimulation Action  Premod    Electrical Stimulation Parameters  10-20Hz , 10'    Electrical Stimulation Goals  Pain;Tone             PT Education - 04/18/18 1047    Education Details  side stepping and monster walk with red looped TB at ankles (pt. already has TB)    Person(s) Educated  Patient    Methods  Explanation;Demonstration;Verbal cues;Handout    Comprehension  Verbalized understanding;Returned demonstration;Verbal cues  required;Need further instruction       PT Short Term Goals - 03/16/18 0949      PT SHORT TERM GOAL #1   Title  Patient to be independent with initial HEP.    Time  4    Period  Weeks    Status  Achieved        PT Long Term Goals - 04/04/18 0809      PT LONG TERM GOAL #1   Title  Patient to be independent with advanced HEP.    Time  8    Period  Weeks    Status  On-going      PT LONG TERM GOAL #2   Title  Patient to demonstrate pain free lumbar AROM.    Time  8    Period  Weeks    Status  On-going      PT LONG TERM GOAL #3   Title  Patient to demonstrate pain free L hip AROM.    Time  8    Period  Weeks    Status  On-going      PT LONG TERM GOAL #4   Title  Patient to demonstrate B LE strength >=4+/5.     Time  8    Period  Weeks    Status  On-going      PT LONG TERM GOAL #5   Title  Patient to report tolerance of 1 hour of walking without pain limiting.     Time  8    Period  Weeks    Status  On-going            Plan - 04/18/18 8250    Clinical Impression Statement  Tammy [name] reporting increased buttocks pain and onset of R posterior/lateral radiating pain following 30 min fast-walk with daughter on Saturday and yardwork on Sunday.  Encouraged pt. to work up to these tasks and not walk as far in one session to reduce lumbar/hip strain and allow body to adjust.  Session focusing on LE stretching, progression of supine and standing proximal hip strengthening which was well tolerated.  Pt. noting reduction in pain following therex.  Ended session with Pre-mod E-stim to B buttocks and moist heat to further reduce pain and tone with good reported relief.  Will continue to progress toward  goals.      PT Treatment/Interventions  ADLs/Self Care Home Management;Cryotherapy;Electrical Stimulation;Iontophoresis 4mg /ml Dexamethasone;Moist Heat;Traction;Ultrasound;Gait training;Stair training;Functional mobility training;Therapeutic activities;Therapeutic exercise;Manual  techniques;Patient/family education;Balance training;Neuromuscular re-education;Passive range of motion;Dry needling;Energy conservation;Splinting;Taping;Vasopneumatic Device    Consulted and Agree with Plan of Care  Patient       Patient will benefit from skilled therapeutic intervention in order to improve the following deficits and impairments:  Hypomobility, Decreased activity tolerance, Decreased strength, Pain, Difficulty walking, Decreased mobility, Decreased balance, Decreased range of motion, Improper body mechanics, Postural dysfunction, Impaired flexibility  Visit Diagnosis: Pain in left hip  Chronic left-sided low back pain with left-sided sciatica  Muscle weakness (generalized)  Difficulty in walking, not elsewhere classified  Other symptoms and signs involving the musculoskeletal system     Problem List Patient Active Problem List   Diagnosis Date Noted  . Acute pain of right knee 01/17/2017  . Chronic back pain 07/13/2016  . Allergic rhinitis 03/09/2016  . Headache(784.0) 03/25/2014  . Nausea and vomiting 03/25/2014  . Essential hypertension 02/18/2014  . Encounter for therapeutic drug monitoring 04/26/2013  . Chronic pain syndrome 02/22/2013  . Chronic insomnia 02/22/2013  . Chronic fatigue syndrome 02/22/2013  . Shortness of breath 11/29/2012  . Paresthesias 10/25/2012  . Muscle weakness 10/25/2012  . Malaise and fatigue 10/18/2012  . Spasm of muscle 10/18/2012  . Disturbance of skin sensation 10/18/2012  . Hyperlipidemia 09/18/2012  . Hypokalemia 09/18/2012  . Neuropathy 09/18/2012  . Chest pain 09/17/2012  . CONSTIPATION 10/23/2008    Bess Harvest, PTA 04/18/18 10:54 AM   [facility] [address]  [place], [place], [address] Phone: [phone]   Fax:  [phone]  Name: Tammy [name] MRN: [id number] Date of Birth: 17-Jul-1961

## 2018-04-18 NOTE — Patient Instructions (Addendum)
TENS stands for Transcutaneous Electrical Nerve Stimulation. In other words, electrical impulses are allowed to pass through the skin in order to excite a nerve.   Purpose and Use of TENS:  TENS is a method used to manage acute and chronic pain without the use of drugs. It has been effective in managing pain associated with surgery, sprains, strains, trauma, rheumatoid arthritis, and neuralgias. It is a non-addictive, low risk, and non-invasive technique used to control pain. It is not, by any means, a curative form of treatment.   How TENS Works:  Most TENS units are a small pocket-sized unit powered by one 9 volt battery. Attached to the outside of the unit are two lead wires where two pins and/or snaps connect on each wire. All units come with a set of four reusable pads or electrodes. These are placed on the skin surrounding the area involved. By inserting the leads into  the pads, the electricity can pass from the unit making the circuit complete.  As the intensity is turned up slowly, the electrical current enters the body from the electrodes through the skin to the surrounding nerve fibers. This triggers the release of hormones from within the body. These hormones contain pain relievers. By increasing the circulation of these hormones, the person's pain may be lessened. It is also believed that the electrical stimulation itself helps to block the pain messages being sent to the brain, thus also decreasing the body's perception of pain.   Hazards:  TENS units are NOT to be used by patients with PACEMAKERS, DEFIBRILLATORS, DIABETIC PUMPS, PREGNANT WOMEN, and patients with SEIZURE DISORDERS.  TENS units are NOT to be used over the heart, throat, brain, or spinal cord.  One of the major side effects from the TENS unit may be skin irritation. Some people may develop a rash if they are sensitive to the materials used in the electrodes or the connecting wires.   Wear the unit for 15'.   Avoid overuse  due the body getting used to the stem making it not as effective over time.    

## 2018-04-21 ENCOUNTER — Ambulatory Visit: Payer: 59 | Admitting: Physical Therapy

## 2018-04-21 MED FILL — DULoxetine HCL 30 MG CPEP: 30 | 90 days supply | Qty: 90 | Fill #0

## 2018-04-21 MED FILL — HYDROCHLOROTHIAZIDE 25 MG T: 25 | 90 days supply | Qty: 90 | Fill #0

## 2018-04-24 MED FILL — ZOLPIDEM TARTRATE 10 MG TAB: 10 | 90 days supply | Qty: 90 | Fill #0

## 2018-04-25 ENCOUNTER — Ambulatory Visit: Payer: 59

## 2018-04-28 ENCOUNTER — Ambulatory Visit: Payer: 59 | Attending: Family Medicine | Admitting: Physical Therapy

## 2018-04-28 ENCOUNTER — Encounter: Payer: Self-pay | Admitting: Physical Therapy

## 2018-04-28 ENCOUNTER — Ambulatory Visit: Payer: 59 | Admitting: Physical Therapy

## 2018-04-28 DIAGNOSIS — R29898 Other symptoms and signs involving the musculoskeletal system: Secondary | ICD-10-CM | POA: Diagnosis present

## 2018-04-28 DIAGNOSIS — R262 Difficulty in walking, not elsewhere classified: Secondary | ICD-10-CM | POA: Insufficient documentation

## 2018-04-28 DIAGNOSIS — M6281 Muscle weakness (generalized): Secondary | ICD-10-CM | POA: Insufficient documentation

## 2018-04-28 DIAGNOSIS — M5442 Lumbago with sciatica, left side: Secondary | ICD-10-CM | POA: Insufficient documentation

## 2018-04-28 DIAGNOSIS — G8929 Other chronic pain: Secondary | ICD-10-CM | POA: Diagnosis present

## 2018-04-28 DIAGNOSIS — M25552 Pain in left hip: Secondary | ICD-10-CM | POA: Insufficient documentation

## 2018-04-28 NOTE — Therapy (Addendum)
[facility] [address]  [place], [place], [address] Phone: [phone]   Fax:  [phone]  Physical Therapy Discharge  Patient Details  Name: Tammy [name] MRN: [id number] Date of Birth: [birth year] Referring Provider: Rikki Spearing, DO   Encounter Date: 04/28/2018  PT End of Session - 04/28/18 1224    Visit Number  9    Number of Visits  17    Date for PT Re-Evaluation  05/03/18    Authorization Type  [facility]    PT Start Time  0807    PT Stop Time  0855    PT Time Calculation (min)  48 min    Activity Tolerance  Patient tolerated treatment well    Behavior During Therapy  [facility] for tasks assessed/performed       Past Medical History:  Diagnosis Date  . Arthritis   . Chronic fatigue syndrome   . Hypertension   . Neuropathy     Past Surgical History:  Procedure Laterality Date  . CARDIAC CATHETERIZATION  09/18/2012  . LEFT HEART CATHETERIZATION WITH CORONARY ANGIOGRAM N/A 09/18/2012   Procedure: LEFT HEART CATHETERIZATION WITH CORONARY ANGIOGRAM;  Surgeon: Sherren Mocha, MD;  Location: [facility] CATH LAB;  Service: Cardiovascular;  Laterality: N/A;  . TONSILLECTOMY  1965    There were no vitals filed for this visit.  Subjective Assessment - 04/28/18 0809    Subjective  Patient reports good relief from heat and TENS last session. Worked in the yard and flared up a little bit. Reports she feels tense in muscles at her job due to stress and cold.    Pertinent History  R knee pain, chronic back pain, HTN, chronic pain syndrome, chronic fatigue syndrome, paresthesias, HLD, hypokalemia    Diagnostic tests  none recent    Patient Stated Goals  walk for an hour, exercise walk, water aerobics    Currently in Pain?  No/denies         [facility] PT Assessment - 04/28/18 0001      Observation/Other Assessments   Focus on Therapeutic Outcomes (FOTO)   Hip: 50 (50% limited, 34% predicted)      AROM   Lumbar Flexion  WFL    Lumbar Extension  [facility]    Lumbar - Right Side Bend  distal thigh    Lumbar - Left Side Bend  distal thigh    Lumbar - Right Rotation  [facility]    Lumbar - Left Rotation  [facility]      Strength   Right Hip Flexion  4+/5    Right Hip ABduction  4+/5    Right Hip ADduction  4+/5    Left Hip Flexion  4/5    Left Hip ABduction  4+/5    Left Hip ADduction  4+/5    Right Knee Flexion  4+/5    Right Knee Extension  4+/5    Left Knee Flexion  4+/5    Left Knee Extension  4+/5    Right Ankle Dorsiflexion  4+/5    Right Ankle Plantar Flexion  4+/5    Left Ankle Dorsiflexion  4+/5    Left Ankle Plantar Flexion  4+/5                   [facility] Adult PT Treatment/Exercise - 04/28/18 0001      Lumbar Exercises: Aerobic   Recumbent Bike  L3x6 min      Lumbar Exercises: Supine   Bridge  with clamshell  Limitations;10 reps    Bridge with [employer] Limitations  red TB around knees      Knee/Hip Exercises: Stretches   Passive Hamstring Stretch  Right;Left;20 seconds;1 rep    Piriformis Stretch  Right;Left;30 seconds;1 rep    Piriformis Stretch Limitations  KTOS      Knee/Hip Exercises: Sidelying   Hip ABduction  Strengthening;1 set;Limitations;15 reps;Left    Clams  B with red TB at knees x 10 reps      Electrical Stimulation   Electrical Stimulation Location  B buttocks     Electrical Stimulation Action  Premod    Electrical Stimulation Parameters  10-20Hz ; 60mn    Electrical Stimulation Goals  Pain;Tone             PT Education - 04/28/18 1223    Education Details  consolidated HEP, personal TENS unit use and electrode placement, aquatic exercises to avoid hip pain    Person(s) Educated  Patient    Methods  Explanation;Demonstration;Tactile cues;Verbal cues;Handout    Comprehension  Returned demonstration;Verbalized understanding       PT Short Term Goals - 04/28/18 00623     PT SHORT TERM GOAL #1   Title  Patient to be independent with initial HEP.    Time  4     Period  Weeks    Status  Achieved        PT Long Term Goals - 04/28/18 07628     PT LONG TERM GOAL #1   Title  Patient to be independent with advanced HEP.    Time  8    Period  Weeks    Status  Partially Met   reports intermittent compliance with HEP     PT LONG TERM GOAL #2   Title  Patient to demonstrate pain free lumbar AROM.    Time  8    Period  Weeks    Status  Achieved   A[facility] to [facility]-free lumbar AROM at this time     PT LONG TERM GOAL #3   Title  Patient to demonstrate pain free L hip AROM.    Time  8    Period  Weeks    Status  Deferred   L hip AROM not tested this date     PT LONG TERM GOAL #4   Title  Patient to demonstrate B LE strength >=4+/5.     Time  8    Period  Weeks    Status  Partially Met   Demonstrated improvements in R hip flexion, L hip abduction and adduction strength.     PT LONG TERM GOAL #5   Title  Patient to report tolerance of 1 hour of walking without pain limiting.     Time  8    Period  Weeks    Status  Not Met   tolerates 20 min of walking before pain           Plan - 04/28/18 1239    Clinical Impression Statement  Patient arrived to clinic with request for 1 additional session before final D/C d/t finances. Patient reported good benefit from TENS and heat administered last treatment, but increase in symptoms after working in the yard. Updated goals this date- patient reporting intermittent compliance with HEP. Able to perform [facility] pain-free lumbar AROM at this time. Demonstrated improvements in R hip flexion, L hip abduction and adduction strength. Still reports limitations in walking tolerance-  limited to 20 min. Patient today with several questions on continuing fitness regimen and pain management after D/C. Focused today's session on educating patient on a consolidated HEP, personal TENS unit use and electrode placement, and aquatic exercises to avoid hip pain. Patient reported understanding. Received e-stim  and moist heat to B buttock per patient's request. Patient without c/o pain at end of session. Patient to be D/C'd at this time due to finances.     PT Treatment/Interventions  ADLs/Self Care Home Management;Cryotherapy;Electrical Stimulation;Iontophoresis 70m/ml Dexamethasone;Moist Heat;Traction;Ultrasound;Gait training;Stair training;Functional mobility training;Therapeutic activities;Therapeutic exercise;Manual techniques;Patient/family education;Balance training;Neuromuscular re-education;Passive range of motion;Dry needling;Energy conservation;Splinting;Taping;Vasopneumatic Device    PT Next Visit Plan  d/c'd at this time    Consulted and Agree with Plan of Care  Patient       Patient will benefit from skilled therapeutic intervention in order to improve the following deficits and impairments:  Hypomobility, Decreased activity tolerance, Decreased strength, Pain, Difficulty walking, Decreased mobility, Decreased balance, Decreased range of motion, Improper body mechanics, Postural dysfunction, Impaired flexibility  Visit Diagnosis: Pain in left hip  Chronic left-sided low back pain with left-sided sciatica  Muscle weakness (generalized)  Difficulty in walking, not elsewhere classified  Other symptoms and signs involving the musculoskeletal system     Problem List Patient Active Problem List   Diagnosis Date Noted  . Acute pain of right knee 01/17/2017  . Chronic back pain 07/13/2016  . Allergic rhinitis 03/09/2016  . Headache(784.0) 03/25/2014  . Nausea and vomiting 03/25/2014  . Essential hypertension 02/18/2014  . Encounter for therapeutic drug monitoring 04/26/2013  . Chronic pain syndrome 02/22/2013  . Chronic insomnia 02/22/2013  . Chronic fatigue syndrome 02/22/2013  . Shortness of breath 11/29/2012  . Paresthesias 10/25/2012  . Muscle weakness 10/25/2012  . Malaise and fatigue 10/18/2012  . Spasm of muscle 10/18/2012  . Disturbance of skin sensation 10/18/2012  .  Hyperlipidemia 09/18/2012  . Hypokalemia 09/18/2012  . Neuropathy 09/18/2012  . Chest pain 09/17/2012  . CONSTIPATION 10/23/2008    PHYSICAL THERAPY DISCHARGE SUMMARY  Visits from Start of Care: 9  Current functional level related to goals / functional outcomes: See above clinical impression   Remaining deficits: See above   Education / Equipment: See above  Plan: Patient agrees to discharge.  Patient goals were not met. Patient is being discharged due to financial reasons.  ?????      YJanene Harvey PT, DPT 04/28/18 12:42 PM    [facility] [place] [address]Phone: [phone]  Fax:  [phone] Name: TMARYLAN GLOREMRN: [id number] of Birth: 703-Mar-1962

## 2018-05-02 ENCOUNTER — Ambulatory Visit: Payer: 59

## 2018-05-05 ENCOUNTER — Ambulatory Visit: Payer: 59

## 2018-05-09 ENCOUNTER — Ambulatory Visit: Payer: 59

## 2018-05-12 ENCOUNTER — Ambulatory Visit: Payer: 59 | Admitting: Physical Therapy

## 2018-05-16 ENCOUNTER — Ambulatory Visit: Payer: 59

## 2018-05-18 MED FILL — VYVANSE 20 MG CAPSULE: 20 | 30 days supply | Qty: 30 | Fill #0

## 2018-05-19 ENCOUNTER — Ambulatory Visit: Payer: 59

## 2018-06-16 MED FILL — VYVANSE 20 MG CAPSULE: 20 | 30 days supply | Qty: 30 | Fill #0

## 2018-07-12 DIAGNOSIS — E782 Mixed hyperlipidemia: Secondary | ICD-10-CM | POA: Diagnosis not present

## 2018-07-12 DIAGNOSIS — F4329 Adjustment disorder with other symptoms: Secondary | ICD-10-CM | POA: Diagnosis not present

## 2018-07-12 DIAGNOSIS — F5104 Psychophysiologic insomnia: Secondary | ICD-10-CM | POA: Diagnosis not present

## 2018-07-12 DIAGNOSIS — F988 Other specified behavioral and emotional disorders with onset usually occurring in childhood and adolescence: Secondary | ICD-10-CM | POA: Diagnosis not present

## 2018-07-12 DIAGNOSIS — M797 Fibromyalgia: Secondary | ICD-10-CM | POA: Diagnosis not present

## 2018-07-12 DIAGNOSIS — M4802 Spinal stenosis, cervical region: Secondary | ICD-10-CM | POA: Diagnosis not present

## 2018-07-12 DIAGNOSIS — G894 Chronic pain syndrome: Secondary | ICD-10-CM | POA: Diagnosis not present

## 2018-07-12 DIAGNOSIS — Z124 Encounter for screening for malignant neoplasm of cervix: Secondary | ICD-10-CM | POA: Diagnosis not present

## 2018-07-12 DIAGNOSIS — I10 Essential (primary) hypertension: Secondary | ICD-10-CM | POA: Diagnosis not present

## 2018-07-13 MED FILL — buPROPion HCL 100 MG TABS: 100 | 30 days supply | Qty: 30 | Fill #0

## 2018-07-16 MED FILL — VYVANSE 20 MG CAPSULE: 20 | 30 days supply | Qty: 30 | Fill #0

## 2018-07-18 MED FILL — DULoxetine HCL 30 MG CPEP: 30 | 90 days supply | Qty: 90 | Fill #1

## 2018-07-18 MED FILL — HYDROCHLOROTHIAZIDE 25 MG T: 25 | 90 days supply | Qty: 90 | Fill #1

## 2018-07-21 MED FILL — ZOLPIDEM TARTRATE 10 MG TAB: 10 | 90 days supply | Qty: 90 | Fill #1

## 2018-07-27 DIAGNOSIS — E782 Mixed hyperlipidemia: Secondary | ICD-10-CM | POA: Diagnosis not present

## 2018-07-27 DIAGNOSIS — Z130 Encounter for screening for diseases of the blood and blood-forming organs and certain disorders involving the immune mechanism: Secondary | ICD-10-CM | POA: Diagnosis not present

## 2018-07-27 DIAGNOSIS — R5383 Other fatigue: Secondary | ICD-10-CM | POA: Diagnosis not present

## 2018-07-27 DIAGNOSIS — Z131 Encounter for screening for diabetes mellitus: Secondary | ICD-10-CM | POA: Diagnosis not present

## 2018-07-27 DIAGNOSIS — I10 Essential (primary) hypertension: Secondary | ICD-10-CM | POA: Diagnosis not present

## 2018-08-23 MED FILL — VYVANSE 20 MG CAPSULE: 20 | 30 days supply | Qty: 30 | Fill #0

## 2018-09-06 ENCOUNTER — Ambulatory Visit (HOSPITAL_BASED_OUTPATIENT_CLINIC_OR_DEPARTMENT_OTHER)
Admission: RE | Admit: 2018-09-06 | Discharge: 2018-09-06 | Disposition: A | Discharge status: Home / Self Care | Payer: 59 | Source: Ambulatory Visit | Attending: Family Medicine | Admitting: Family Medicine

## 2018-09-06 ENCOUNTER — Other Ambulatory Visit (HOSPITAL_BASED_OUTPATIENT_CLINIC_OR_DEPARTMENT_OTHER): Payer: Self-pay | Admitting: Family Medicine

## 2018-09-06 DIAGNOSIS — Z1231 Encounter for screening mammogram for malignant neoplasm of breast: Secondary | ICD-10-CM | POA: Insufficient documentation

## 2018-09-07 ENCOUNTER — Other Ambulatory Visit: Payer: Self-pay

## 2018-09-07 DIAGNOSIS — D485 Neoplasm of uncertain behavior of skin: Secondary | ICD-10-CM | POA: Diagnosis not present

## 2018-10-06 DIAGNOSIS — G43B0 Ophthalmoplegic migraine, not intractable: Secondary | ICD-10-CM | POA: Diagnosis not present

## 2018-10-12 ENCOUNTER — Encounter: Payer: Self-pay | Admitting: Physician Assistant

## 2018-10-12 ENCOUNTER — Encounter: Payer: Self-pay | Admitting: Internal Medicine

## 2018-10-12 ENCOUNTER — Other Ambulatory Visit (INDEPENDENT_AMBULATORY_CARE_PROVIDER_SITE_OTHER): Payer: 59

## 2018-10-12 ENCOUNTER — Ambulatory Visit (INDEPENDENT_AMBULATORY_CARE_PROVIDER_SITE_OTHER): Payer: 59 | Admitting: Physician Assistant

## 2018-10-12 VITALS — BP 106/62 | HR 74 | Ht 63.0 in | Wt 155.2 lb

## 2018-10-12 DIAGNOSIS — K5900 Constipation, unspecified: Secondary | ICD-10-CM

## 2018-10-12 DIAGNOSIS — K625 Hemorrhage of anus and rectum: Secondary | ICD-10-CM

## 2018-10-12 LAB — CBC WITH DIFFERENTIAL/PLATELET
Basophils Absolute: 0 10*3/uL (ref 0.0–0.1)
Basophils Relative: 0.7 % (ref 0.0–3.0)
Eosinophils Absolute: 0.1 10*3/uL (ref 0.0–0.7)
Eosinophils Relative: 2.8 % (ref 0.0–5.0)
HCT: 40.1 % (ref 36.0–46.0)
Hemoglobin: 13.9 g/dL (ref 12.0–15.0)
Lymphocytes Relative: 34.4 % (ref 12.0–46.0)
Lymphs Abs: 1.4 10*3/uL (ref 0.7–4.0)
MCHC: 34.6 g/dL (ref 30.0–36.0)
MCV: 80.5 fl (ref 78.0–100.0)
Monocytes Absolute: 0.3 10*3/uL (ref 0.1–1.0)
Monocytes Relative: 8 % (ref 3.0–12.0)
Neutro Abs: 2.2 10*3/uL (ref 1.4–7.7)
Neutrophils Relative %: 54.1 % (ref 43.0–77.0)
Platelets: 201 10*3/uL (ref 150.0–400.0)
RBC: 4.98 Mil/uL (ref 3.87–5.11)
RDW: 13 % (ref 11.5–15.5)
WBC: 4.1 10*3/uL (ref 4.0–10.5)

## 2018-10-12 MED ORDER — HYDROCORTISONE ACETATE 25 MG RE SUPP: RECTAL | 2 refills | Status: DC

## 2018-10-12 MED ORDER — NA SULFATE-K SULFATE-MG SULF 17.5-3.13-1.6 GM/177ML PO SOLN: ORAL | 0 refills | Status: DC

## 2018-10-12 MED FILL — SUPREP BOWEL PREP KIT: 17.5-3.13-1 | 1 days supply | Qty: 354 | Fill #0

## 2018-10-12 MED FILL — HYDROCORTISONE ACETATE 25 M: 25 | 14 days supply | Qty: 12 | Fill #0

## 2018-10-12 NOTE — Patient Instructions (Addendum)
Your provider has requested that you go to the basement level for lab work before leaving today. Press "B" on the elevator. The lab is located at the first door on the left as you exit the elevator.  We sent the prescription for the prep to [facility]. Also sent Anusol HC suppositories prescription.  You have been scheduled for a colonoscopy. Please follow written instructions given to you at your visit today.  Please pick up your prep supplies at the pharmacy within the next 1-3 days. If you use inhalers (even only as needed), please bring them with you on the day of your procedure.

## 2018-10-12 NOTE — Progress Notes (Addendum)
Subjective:    Patient ID: Tammy [name], female    DOB: Jan 03, 1961, 58 y.o.   MRN: [id number]  HPI [name] is a pleasant 58 year old white female, known to Dr. Hilarie Fredrickson, with history of constipation. She had undergone remote colonoscopy in April 2011 for screening which was normal. She comes in today with complaint of rectal bleeding over the past 3 to 4 months.  At this point the bleeding is occurring on a daily basis and with most bowel movements.  Blood has been red and is usually noted after a bowel movement.  She says occasionally she will have an urge for a bowel movement and passes just some blood and what appears to be tissue but no stool.  She has been taking an herbal supplement for constipation which contains magnesium says that has been working well for her mild constipation and she is currently having daily bowel movements.  She denies any abdominal pain.  She has been having some sensation of rectal pressure after bowel movements and a feeling as if there is a fullness in her rectum.  Family history is pertinent for father with colon polyps, no history of colon cancer or IBD.  Her mother is currently being treated for pancreatic cancer.  Review of Systems Pertinent positive and negative review of systems were noted in the above HPI section.  All other review of systems was otherwise negative.  Outpatient Encounter Medications as of 10/12/2018  Medication Sig  . DULoxetine (CYMBALTA) 30 MG capsule Take 1 capsule (30 mg total) by mouth 2 (two) times daily.  . hydrochlorothiazide (HYDRODIURIL) 25 MG tablet Take 1 tablet (25 mg total) by mouth daily.  Marland Kitchen lisdexamfetamine (VYVANSE) 20 MG capsule Take 1 capsule (20 mg total) by mouth daily.  Marland Kitchen zolpidem (AMBIEN) 10 MG tablet Take 1/2 tablet by mouth at bedtime as needed.  . hydrocortisone (ANUSOL-HC) 25 MG suppository Use 1 suppository per rectum at bedtime for 2 weeks then as needed.  . Na Sulfate-K Sulfate-Mg Sulf 17.5-3.13-1.6 GM/177ML  SOLN Take as directed for colonoscopy prep.  . [DISCONTINUED] fluticasone (FLONASE) 50 MCG/ACT nasal spray Place 1 spray into both nostrils 2 (two) times daily. (Patient not taking: Reported on 07/29/2017)  . [DISCONTINUED] gabapentin (NEURONTIN) 100 MG capsule Take 3 capsules (300 mg total) by mouth at bedtime. (Patient not taking: Reported on 07/29/2017)   No facility-administered encounter medications on file as of 10/12/2018.    Allergies  Allergen Reactions  . Eggs Or Egg-Derived Products Other (See Comments)    Makes pt feel like burping up rotten eggs   Patient Active Problem List   Diagnosis Date Noted  . Acute pain of right knee 01/17/2017  . Chronic back pain 07/13/2016  . Allergic rhinitis 03/09/2016  . Headache(784.0) 03/25/2014  . Nausea and vomiting 03/25/2014  . Essential hypertension 02/18/2014  . Encounter for therapeutic drug monitoring 04/26/2013  . Chronic pain syndrome 02/22/2013  . Chronic insomnia 02/22/2013  . Chronic fatigue syndrome 02/22/2013  . Shortness of breath 11/29/2012  . Paresthesias 10/25/2012  . Muscle weakness 10/25/2012  . Malaise and fatigue 10/18/2012  . Spasm of muscle 10/18/2012  . Disturbance of skin sensation 10/18/2012  . Hyperlipidemia 09/18/2012  . Hypokalemia 09/18/2012  . Neuropathy 09/18/2012  . Chest pain 09/17/2012  . CONSTIPATION 10/23/2008   Social History   Socioeconomic History  . Marital status: Married    Spouse name: Not on file  . Number of children: 2  . Years of education: Not on  file  . Highest education level: Not on file  Occupational History  . Occupation: Diplomatic Services operational officer: [employer]  . Financial resource strain: Not on file  . Food insecurity:    Worry: Not on file    Inability: Not on file  . Transportation needs:    Medical: Not on file    Non-medical: Not on file  Tobacco Use  . Smoking status: Never Smoker  . Smokeless tobacco: Never Used  Substance and Sexual Activity    . Alcohol use: No  . Drug use: No  . Sexual activity: Not on file  Lifestyle  . Physical activity:    Days per week: Not on file    Minutes per session: Not on file  . Stress: Not on file  Relationships  . Social connections:    Talks on phone: Not on file    Gets together: Not on file    Attends religious service: Not on file    Active member of club or organization: Not on file    Attends meetings of clubs or organizations: Not on file    Relationship status: Not on file  . Intimate partner violence:    Fear of current or ex partner: Not on file    Emotionally abused: Not on file    Physically abused: Not on file    Forced sexual activity: Not on file  Other Topics Concern  . Not on file  Social History Narrative  . Not on file    Ms. Lilja's family history includes Arthritis in her maternal grandmother; Colon polyps in her father; Diabetes in her maternal grandmother; Heart attack in her brother; Heart attack (age of onset: 53) in her maternal grandmother; Heart disease in her maternal grandmother; Hyperlipidemia in her maternal grandmother.      Objective:    Vitals:   10/12/18 0833  BP: 106/62  Pulse: 74    Physical Exam; well-developed older white female in no acute distress, pleasant.  Height 5 foot 3, weight 155, BMI 27.4.  HEENT; nontraumatic normocephalic EOMI PERRLA sclera anicteric, oral mucosa moist.  Cardiovascular ;regular rate and rhythm with S1-S2 no murmur rub or gallop, Pulmonary; clear bilaterally, Abdomen; soft, nontender nondistended bowel sounds are active there is no palpable mass or hepatosplenomegaly, Rectal; exam; small external hemorrhoid, on digital exam there is an area of irregularity, somewhat firm and mildly tender with palpation on the left, pinkish heme on examining glove.  On anoscopy this area is very friable, and has a polypoid, frond-like tip .Marland Kitchen Ext;no clubbing cyanosis or edema skin warm and dry, Neuropsych ;alert and oriented, grossly  nonfocal mood and affect appropriate       Assessment & Plan:   #15 58 year old white female with 3 to 48-month history of rectal bleeding.  Patient has been noting bright red blood daily basis, with most bowel movements.  Also occasionally passing just blood.  Complaints of abdominal pain but has had some rectal pressure especially after bowel movements. On exam today she has an area in the anorectum of irregularity and  firmness, which is very friable on anoscopy and what polypoid frond-like appearance. Rule out rectal polyp versus neoplasm, consider bleeding secondary to internal hemorrhoids  Patient had a negative screening colonoscopy in 2011  #2 constipation -stable #3.  Hypertension  Plan; check CBC with differential today Start Anusol HC suppositories nightly x10 days then repeat as needed She will be scheduled for colonoscopy with Dr. Hilarie Fredrickson.  Procedure was discussed in detail with the patient including indications risks and benefits and she is agreeable to proceed.     S  PA-C 10/12/2018   Cc: Glenford Bayley, DO   Addendum: Reviewed and agree with assessment and management plan. Pyrtle, Lajuan Lines, MD

## 2018-10-13 MED FILL — DULoxetine HCL 30 MG CPEP: 30 | 90 days supply | Qty: 90 | Fill #2

## 2018-10-13 MED FILL — HYDROCHLOROTHIAZIDE 25 MG T: 25 | 90 days supply | Qty: 90 | Fill #2

## 2018-10-16 MED FILL — VYVANSE 20 MG CAPSULE: 20 | 30 days supply | Qty: 30 | Fill #0

## 2018-10-20 MED FILL — ZOLPIDEM TARTRATE 10 MG TAB: 10 | 30 days supply | Qty: 30 | Fill #0

## 2018-10-24 ENCOUNTER — Encounter: Payer: 59 | Admitting: Internal Medicine

## 2018-10-31 ENCOUNTER — Ambulatory Visit (AMBULATORY_SURGERY_CENTER): Payer: 59 | Admitting: Internal Medicine

## 2018-10-31 ENCOUNTER — Encounter: Payer: Self-pay | Admitting: Internal Medicine

## 2018-10-31 VITALS — BP 104/67 | HR 67 | Temp 98.0°F | Resp 12 | Ht 63.0 in | Wt 155.0 lb

## 2018-10-31 DIAGNOSIS — K625 Hemorrhage of anus and rectum: Secondary | ICD-10-CM | POA: Diagnosis not present

## 2018-10-31 DIAGNOSIS — K5730 Diverticulosis of large intestine without perforation or abscess without bleeding: Secondary | ICD-10-CM | POA: Diagnosis not present

## 2018-10-31 DIAGNOSIS — K6289 Other specified diseases of anus and rectum: Secondary | ICD-10-CM

## 2018-10-31 DIAGNOSIS — K626 Ulcer of anus and rectum: Secondary | ICD-10-CM | POA: Diagnosis not present

## 2018-10-31 DIAGNOSIS — K629 Disease of anus and rectum, unspecified: Secondary | ICD-10-CM

## 2018-10-31 DIAGNOSIS — K648 Other hemorrhoids: Secondary | ICD-10-CM | POA: Diagnosis not present

## 2018-10-31 DIAGNOSIS — E785 Hyperlipidemia, unspecified: Secondary | ICD-10-CM | POA: Diagnosis not present

## 2018-10-31 DIAGNOSIS — E669 Obesity, unspecified: Secondary | ICD-10-CM | POA: Diagnosis not present

## 2018-10-31 DIAGNOSIS — R5382 Chronic fatigue, unspecified: Secondary | ICD-10-CM | POA: Diagnosis not present

## 2018-10-31 DIAGNOSIS — K5900 Constipation, unspecified: Secondary | ICD-10-CM | POA: Diagnosis not present

## 2018-10-31 DIAGNOSIS — I10 Essential (primary) hypertension: Secondary | ICD-10-CM | POA: Diagnosis not present

## 2018-10-31 MED ORDER — SODIUM CHLORIDE 0.9 % IV SOLN: 500.0000 mL | Freq: Once | INTRAVENOUS | Status: DC

## 2018-10-31 NOTE — Op Note (Signed)
[facility] Patient Name: Tammy [name] Procedure Date: 10/31/2018 3:38 PM MRN: [id number] Endoscopist: Jerene Bears , MD Age: 58 Referring MD:  Date of Birth: [birth year] Gender: Female Account #: [id number] Procedure:                Colonoscopy Indications:              Rectal bleeding, normal colonoscopy 2011 Medicines:                Monitored Anesthesia Care Procedure:                Pre-Anesthesia Assessment:                           - Prior to the procedure, a History and Physical                            was performed, and patient medications and                            allergies were reviewed. The patient's tolerance of                            previous anesthesia was also reviewed. The risks                            and benefits of the procedure and the sedation                            options and risks were discussed with the patient.                            All questions were answered, and informed consent                            was obtained. Prior Anticoagulants: The patient has                            taken no previous anticoagulant or antiplatelet                            agents. ASA Grade Assessment: II - A patient with                            mild systemic disease. After reviewing the risks                            and benefits, the patient was deemed in                            satisfactory condition to undergo the procedure.                           After obtaining informed consent, the colonoscope  was passed under direct vision. Throughout the                            procedure, the patient's blood pressure, pulse, and                            oxygen saturations were monitored continuously. The                            Colonoscope was introduced through the anus and                            advanced to the cecum, identified by appendiceal                            orifice and ileocecal  valve. The colonoscopy was                            performed without difficulty. The patient tolerated                            the procedure well. The quality of the bowel                            preparation was good. The ileocecal valve,                            appendiceal orifice, and rectum were photographed. Scope In: 3:43:01 PM Scope Out: 4:04:09 PM Scope Withdrawal Time: 0 hours 15 minutes 4 seconds  Total Procedure Duration: 0 hours 21 minutes 8 seconds  Findings:                 Small external hemorrhoids. Polypoid lesion in                            distal rectum at DRE (soft and smooth).                           Scattered small-mouthed diverticula were found in                            the sigmoid colon and descending colon.                           Internal hemorrhoids were found during                            retroflexion. The hemorrhoids were medium-sized. In                            the distal rectum just above the dentate line there                            is an area of inflamed polypoid mucosa (same area  visualized at anoscopy in the office). This is                            likely a friable and inflamed internal hemorrhoid.                            Biopsies were taken with a cold forceps for                            histology and to exclude dysplasia. Complications:            No immediate complications. Estimated Blood Loss:     Estimated blood loss was minimal. Impression:               - Diverticulosis in the sigmoid colon and in the                            descending colon.                           - Internal hemorrhoids with inflamed polypoid                            tissue. Biopsied. Recommendation:           - Patient has a contact number available for                            emergencies. The signs and symptoms of potential                            delayed complications were discussed with the                             patient. Return to normal activities tomorrow.                            Written discharge instructions were provided to the                            patient.                           - Resume previous diet.                           - Continue present medications.                           - Await pathology results.                           - Repeat colonoscopy is recommended for screening                            purposes. The colonoscopy date will be determined  after pathology results from today's exam become                            available for review. Jerene Bears, MD 10/31/2018 4:11:49 PM This report has been signed electronically.

## 2018-10-31 NOTE — Progress Notes (Signed)
Called to room to assist during endoscopic procedure.  Patient ID and intended procedure confirmed with present staff. Received instructions for my participation in the procedure from the performing physician.  

## 2018-10-31 NOTE — Progress Notes (Signed)
Report to PACU, RN, vss, BBS= Clear.  

## 2018-10-31 NOTE — Patient Instructions (Signed)
Please read handouts provided. Continue present medications. Await pathology results.     YOU HAD AN ENDOSCOPIC PROCEDURE TODAY AT THE [facility]:   Refer to the procedure report that was given to you for any specific questions about what was found during the examination.  If the procedure report does not answer your questions, please call your gastroenterologist to clarify.  If you requested that your care partner not be given the details of your procedure findings, then the procedure report has been included in a sealed envelope for you to review at your convenience later.  YOU SHOULD EXPECT: Some feelings of bloating in the abdomen. Passage of more gas than usual.  Walking can help get rid of the air that was put into your GI tract during the procedure and reduce the bloating. If you had a lower endoscopy (such as a colonoscopy or flexible sigmoidoscopy) you may notice spotting of blood in your stool or on the toilet paper. If you underwent a bowel prep for your procedure, you may not have a normal bowel movement for a few days.  Please Note:  You might notice some irritation and congestion in your nose or some drainage.  This is from the oxygen used during your procedure.  There is no need for concern and it should clear up in a day or so.  SYMPTOMS TO REPORT IMMEDIATELY:   Following lower endoscopy (colonoscopy or flexible sigmoidoscopy):  Excessive amounts of blood in the stool  Significant tenderness or worsening of abdominal pains  Swelling of the abdomen that is new, acute  Fever of 100F or higher    For urgent or emergent issues, a gastroenterologist can be reached at any hour by calling [phone].   DIET:  We do recommend a small meal at first, but then you may proceed to your regular diet.  Drink plenty of fluids but you should avoid alcoholic beverages for 24 hours.  ACTIVITY:  You should plan to take it easy for the rest of today and you should NOT DRIVE  or use heavy machinery until tomorrow (because of the sedation medicines used during the test).    FOLLOW UP: Our staff will call the number listed on your records the next business day following your procedure to check on you and address any questions or concerns that you may have regarding the information given to you following your procedure. If we do not reach you, we will leave a message.  However, if you are feeling well and you are not experiencing any problems, there is no need to return our call.  We will assume that you have returned to your regular daily activities without incident.  If any biopsies were taken you will be contacted by phone or by letter within the next 1-3 weeks.  Please call us at [phone] if you have not heard about the biopsies in 3 weeks.    SIGNATURES/CONFIDENTIALITY: You and/or your care partner have signed paperwork which will be entered into your electronic medical record.  These signatures attest to the fact that that the information above on your After Visit Summary has been reviewed and is understood.  Full responsibility of the confidentiality of this discharge information lies with you and/or your care-partner. 

## 2018-11-01 ENCOUNTER — Telehealth: Payer: Self-pay

## 2018-11-01 MED FILL — HYDROCORTISONE ACETATE 25 M: 25 | 14 days supply | Qty: 12 | Fill #1

## 2018-11-01 NOTE — Telephone Encounter (Signed)
  Follow up Call-  Call back number 10/31/2018  Post procedure Call Back phone  # [phone]  Permission to leave phone message Yes  Some recent data might be hidden     Patient questions:  Do you have a fever, pain , or abdominal swelling? No. Pain Score  0 *  Have you tolerated food without any problems? Yes.    Have you been able to return to your normal activities? Yes.    Do you have any questions about your discharge instructions: Diet   No. Medications  No. Follow up visit  No.  Do you have questions or concerns about your Care? No.  Actions: * If pain score is 4 or above: No action needed, pain <4.

## 2018-11-06 ENCOUNTER — Telehealth: Payer: Self-pay | Admitting: Internal Medicine

## 2018-11-06 NOTE — Telephone Encounter (Signed)
Pt called to inquire about biopsy results.

## 2018-11-06 NOTE — Telephone Encounter (Signed)
Pt calling for biopsy results. Please advise. 

## 2018-11-07 NOTE — Telephone Encounter (Signed)
I spoke to patient and conversation documented on pathology result note

## 2018-11-10 ENCOUNTER — Encounter: Payer: 59 | Admitting: Internal Medicine

## 2018-11-16 MED FILL — VYVANSE 20 MG CAPSULE: 20 | 30 days supply | Qty: 30 | Fill #0

## 2018-11-18 MED FILL — ZOLPIDEM TARTRATE 10 MG TAB: 10 | 30 days supply | Qty: 30 | Fill #1 | Status: TO

## 2018-11-21 DIAGNOSIS — F4320 Adjustment disorder, unspecified: Secondary | ICD-10-CM | POA: Diagnosis not present

## 2018-11-21 DIAGNOSIS — E785 Hyperlipidemia, unspecified: Secondary | ICD-10-CM | POA: Diagnosis not present

## 2018-12-05 MED FILL — HYDROCHLOROTHIAZIDE 25 MG T: 25 | 90 days supply | Qty: 90 | Fill #3

## 2018-12-08 ENCOUNTER — Ambulatory Visit: Payer: 59 | Admitting: Family Medicine

## 2018-12-12 MED FILL — VYVANSE 20 MG CAPSULE: 20 | 30 days supply | Qty: 30 | Fill #0

## 2018-12-14 MED FILL — ZOLPIDEM TARTRATE 10 MG TAB: 10 | 30 days supply | Qty: 30 | Fill #0

## 2019-01-09 DIAGNOSIS — R41840 Attention and concentration deficit: Secondary | ICD-10-CM | POA: Diagnosis not present

## 2019-01-09 DIAGNOSIS — F4329 Adjustment disorder with other symptoms: Secondary | ICD-10-CM | POA: Diagnosis not present

## 2019-01-09 DIAGNOSIS — G4700 Insomnia, unspecified: Secondary | ICD-10-CM | POA: Diagnosis not present

## 2019-01-09 DIAGNOSIS — I10 Essential (primary) hypertension: Secondary | ICD-10-CM | POA: Diagnosis not present

## 2019-01-09 MED FILL — ZOLPIDEM TARTRATE 10 MG TAB: 10 | 60 days supply | Qty: 60 | Fill #0

## 2019-01-09 MED FILL — VYVANSE 20 MG CAPSULE: 20 | 30 days supply | Qty: 30 | Fill #0

## 2019-01-09 MED FILL — FLUoxetine HCL 10 MG CAPS: 10 | 52 days supply | Qty: 90 | Fill #0

## 2019-02-07 MED FILL — VYVANSE 20 MG CAPSULE: 20 | 30 days supply | Qty: 30 | Fill #0

## 2019-02-09 DIAGNOSIS — M62838 Other muscle spasm: Secondary | ICD-10-CM | POA: Diagnosis not present

## 2019-02-09 MED FILL — CYCLOBENZAPRINE HCL 10 MG T: 10 | 30 days supply | Qty: 30 | Fill #0

## 2019-02-26 DIAGNOSIS — Z03818 Encounter for observation for suspected exposure to other biological agents ruled out: Secondary | ICD-10-CM | POA: Diagnosis not present

## 2019-03-07 MED FILL — FLUoxetine HCL 10 MG CAPS: 10 | 45 days supply | Qty: 90 | Fill #1

## 2019-03-07 MED FILL — HYDROCHLOROTHIAZIDE 25 MG T: 25 | 90 days supply | Qty: 90 | Fill #0

## 2019-03-07 MED FILL — ZOLPIDEM TARTRATE 10 MG TAB: 10 | 60 days supply | Qty: 60 | Fill #1

## 2019-03-08 MED FILL — VYVANSE 20 MG CAPSULE: 20 | 30 days supply | Qty: 30 | Fill #0

## 2019-03-12 DIAGNOSIS — Z0189 Encounter for other specified special examinations: Secondary | ICD-10-CM | POA: Diagnosis not present

## 2019-03-12 DIAGNOSIS — R41840 Attention and concentration deficit: Secondary | ICD-10-CM | POA: Diagnosis not present

## 2019-03-12 DIAGNOSIS — R7303 Prediabetes: Secondary | ICD-10-CM | POA: Diagnosis not present

## 2019-03-12 DIAGNOSIS — I10 Essential (primary) hypertension: Secondary | ICD-10-CM | POA: Diagnosis not present

## 2019-03-12 DIAGNOSIS — Z0000 Encounter for general adult medical examination without abnormal findings: Secondary | ICD-10-CM | POA: Diagnosis not present

## 2019-03-12 DIAGNOSIS — F419 Anxiety disorder, unspecified: Secondary | ICD-10-CM | POA: Diagnosis not present

## 2019-03-12 DIAGNOSIS — Z13220 Encounter for screening for lipoid disorders: Secondary | ICD-10-CM | POA: Diagnosis not present

## 2019-03-23 ENCOUNTER — Telehealth: Payer: 59 | Admitting: Family

## 2019-03-23 DIAGNOSIS — R05 Cough: Secondary | ICD-10-CM

## 2019-03-23 DIAGNOSIS — R059 Cough, unspecified: Secondary | ICD-10-CM

## 2019-03-23 DIAGNOSIS — Z20822 Contact with and (suspected) exposure to covid-19: Secondary | ICD-10-CM

## 2019-03-23 MED ORDER — ALBUTEROL SULFATE HFA 108 (90 BASE) MCG/ACT IN AERS: 2.0000 | INHALATION_SPRAY | RESPIRATORY_TRACT | 2 refills | Status: DC | PRN

## 2019-03-23 MED ORDER — BENZONATATE 100 MG PO CAPS
100.0000 mg | ORAL_CAPSULE | Freq: Three times a day (TID) | ORAL | 0 refills | Status: DC | PRN
Start: 2019-03-23 — End: 2019-10-12

## 2019-03-23 MED FILL — ALBUTEROL SULFATE HFA 108 (: 108 (90 BAS | 16 days supply | Qty: 18 | Fill #0

## 2019-03-23 MED FILL — BENZONATATE 100 MG CAPS: 100 | 5 days supply | Qty: 30 | Fill #0

## 2019-03-23 NOTE — Progress Notes (Signed)
Greater than 5 minutes, yet less than 10 minutes of time have been spent researching, coordinating, and implementing care for this patient today.  Thank you for the details you included in the comment boxes. Those details are very helpful in determining the best course of treatment for you and help [place] to provide the best care.  As far as testing locations, the only information we have is listed below to get tested. Please call those phones or check the test site locator link below.   E-Visit for Corona Virus Screening   Your current symptoms could be consistent with the coronavirus.  Call your health care provider or local health department to request and arrange formal testing. Many health care providers can now test patients at their office but not all are.  Please quarantine yourself while awaiting your test results.  [facility] [phone], [place], [place] or visit [email or web address]     COVID-19 is a respiratory illness with symptoms that are similar to the flu. Symptoms are typically mild to moderate, but there have been cases of severe illness and death due to the virus. The following symptoms may appear 2-14 days after exposure: . Fever . Cough . Shortness of breath or difficulty breathing . Chills . Repeated shaking with chills . Muscle pain . Headache . Sore throat . New loss of taste or smell . Fatigue . Congestion or runny nose . Nausea or vomiting . Diarrhea  It is vitally important that if you feel that you have an infection such as this virus or any other virus that you stay home and away from places where you may spread it to others.  You should self-quarantine for 14 days if you have symptoms that could potentially be coronavirus or have been in close contact a with a person diagnosed with COVID-19 within the last  2 weeks. You should avoid contact with people age 60 and older.   You should wear a mask or cloth face covering over your nose and mouth if you must be around other people or animals, including pets (even at home). Try to stay at least 6 feet away from other people. This will protect the people around you.  You can use medication such as A prescription cough medication called Tessalon Perles 100 mg. You may take 1-2 capsules every 8 hours as needed for cough and A prescription inhaler called Albuterol MDI 90 mcg /actuation 2 puffs every 4 hours as needed for shortness of breath, wheezing, cough  You may also take acetaminophen (Tylenol) as needed for fever.   Reduce your risk of any infection by using the same precautions used for avoiding the common cold or flu:  Marland Kitchen Wash your hands often with soap and warm water for at least 20 seconds.  If soap and water are not readily available, use an alcohol-based hand sanitizer with at least 60% alcohol.  . If coughing or sneezing, cover your mouth and nose by coughing or sneezing into the elbow areas of your shirt or coat, into a tissue or into your sleeve (not your hands). . Avoid shaking hands with others and consider head nods or verbal greetings only. . Avoid touching your eyes, nose, or mouth with unwashed hands.  . Avoid close contact with people who are sick. . Avoid places or events with large numbers of people in one location, like concerts or sporting events. . Carefully consider travel plans you have or are making. Marland Kitchen  If you are planning any travel outside or inside the [place], visit the CDC's Travelers' Health webpage for the latest health notices. . If you have some symptoms but not all symptoms, continue to monitor at home and seek medical attention if your symptoms worsen. . If you are having a medical emergency, call 911.  HOME CARE . Only take medications as instructed by your medical team. . Drink plenty of fluids and get plenty of  rest. . A steam or ultrasonic humidifier can help if you have congestion.   GET HELP RIGHT AWAY IF YOU HAVE EMERGENCY WARNING SIGNS** FOR COVID-19. If you or someone is showing any of these signs seek emergency medical care immediately. Call 911 or proceed to your closest emergency facility if: . You develop worsening high fever. . Trouble breathing . Bluish lips or face . Persistent pain or pressure in the chest . New confusion . Inability to wake or stay awake . You cough up blood. . Your symptoms become more severe  **This list is not all possible symptoms. Contact your medical provider for any symptoms that are sever or concerning to you.   MAKE SURE YOU   Understand these instructions.  Will watch your condition.  Will get help right away if you are not doing well or get worse.  Your e-visit answers were reviewed by a board certified advanced clinical practitioner to complete your personal care plan.  Depending on the condition, your plan could have included both over the counter or prescription medications.  If there is a problem please reply once you have received a response from your provider.  Your safety is important to [place].  If you have drug allergies check your prescription carefully.    You can use MyChart to ask questions about today's visit, request a non-urgent call back, or ask for a work or school excuse for 24 hours related to this e-Visit. If it has been greater than 24 hours you will need to follow up with your provider, or enter a new e-Visit to address those concerns. You will get an e-mail in the next two days asking about your experience.  I hope that your e-visit has been valuable and will speed your recovery. Thank you for using e-visits.

## 2019-04-09 MED FILL — VYVANSE 20 MG CAPSULE: 20 | 30 days supply | Qty: 30 | Fill #0

## 2019-04-26 DIAGNOSIS — M62838 Other muscle spasm: Secondary | ICD-10-CM | POA: Diagnosis not present

## 2019-04-26 MED FILL — CYCLOBENZAPRINE HCL 10 MG T: 10 | 30 days supply | Qty: 30 | Fill #0

## 2019-05-01 DIAGNOSIS — F419 Anxiety disorder, unspecified: Secondary | ICD-10-CM | POA: Diagnosis not present

## 2019-05-01 DIAGNOSIS — R41840 Attention and concentration deficit: Secondary | ICD-10-CM | POA: Diagnosis not present

## 2019-05-01 DIAGNOSIS — R7303 Prediabetes: Secondary | ICD-10-CM | POA: Diagnosis not present

## 2019-05-01 DIAGNOSIS — Z0189 Encounter for other specified special examinations: Secondary | ICD-10-CM | POA: Diagnosis not present

## 2019-05-01 DIAGNOSIS — Z13220 Encounter for screening for lipoid disorders: Secondary | ICD-10-CM | POA: Diagnosis not present

## 2019-05-01 DIAGNOSIS — I10 Essential (primary) hypertension: Secondary | ICD-10-CM | POA: Diagnosis not present

## 2019-05-01 DIAGNOSIS — Z0000 Encounter for general adult medical examination without abnormal findings: Secondary | ICD-10-CM | POA: Diagnosis not present

## 2019-05-07 MED FILL — VYVANSE 20 MG CAPSULE: 20 | 30 days supply | Qty: 30 | Fill #0

## 2019-05-24 MED FILL — SERTRALINE HCL 25 MG TABLET: 25 | 30 days supply | Qty: 60 | Fill #0

## 2019-06-05 MED FILL — ZOLPIDEM TARTRATE 10 MG TAB: 10 | 60 days supply | Qty: 60 | Fill #2

## 2019-06-05 MED FILL — VYVANSE 20 MG CAPSULE: 20 | 30 days supply | Qty: 30 | Fill #0

## 2019-06-05 MED FILL — HYDROCHLOROTHIAZIDE 25 MG T: 25 | 90 days supply | Qty: 90 | Fill #1

## 2019-07-05 DIAGNOSIS — G4700 Insomnia, unspecified: Secondary | ICD-10-CM | POA: Diagnosis not present

## 2019-07-06 DIAGNOSIS — R41840 Attention and concentration deficit: Secondary | ICD-10-CM | POA: Diagnosis not present

## 2019-07-06 DIAGNOSIS — F419 Anxiety disorder, unspecified: Secondary | ICD-10-CM | POA: Diagnosis not present

## 2019-07-06 MED FILL — VYVANSE 20 MG CAPSULE: 20 | 30 days supply | Qty: 30 | Fill #0

## 2019-07-06 MED FILL — ESCITALOPRAM 10 MG TABLET: 10 | 30 days supply | Qty: 30 | Fill #0

## 2019-07-12 DIAGNOSIS — R413 Other amnesia: Secondary | ICD-10-CM | POA: Diagnosis not present

## 2019-07-12 DIAGNOSIS — R41840 Attention and concentration deficit: Secondary | ICD-10-CM | POA: Diagnosis not present

## 2019-07-12 DIAGNOSIS — I10 Essential (primary) hypertension: Secondary | ICD-10-CM | POA: Diagnosis not present

## 2019-07-12 DIAGNOSIS — M797 Fibromyalgia: Secondary | ICD-10-CM | POA: Diagnosis not present

## 2019-07-12 DIAGNOSIS — F418 Other specified anxiety disorders: Secondary | ICD-10-CM | POA: Diagnosis not present

## 2019-07-12 MED FILL — SERTRALINE HCL 25 MG TABLET: 25 | 30 days supply | Qty: 60 | Fill #0

## 2019-07-26 DIAGNOSIS — F418 Other specified anxiety disorders: Secondary | ICD-10-CM | POA: Diagnosis not present

## 2019-08-06 MED FILL — ESCITALOPRAM 10 MG TABLET: 10 | 30 days supply | Qty: 30 | Fill #1

## 2019-08-06 MED FILL — VYVANSE 20 MG CAPSULE: 20 | 30 days supply | Qty: 30 | Fill #0

## 2019-08-13 DIAGNOSIS — F418 Other specified anxiety disorders: Secondary | ICD-10-CM | POA: Diagnosis not present

## 2019-08-15 DIAGNOSIS — G4700 Insomnia, unspecified: Secondary | ICD-10-CM | POA: Diagnosis not present

## 2019-08-28 DIAGNOSIS — F4312 Post-traumatic stress disorder, chronic: Secondary | ICD-10-CM | POA: Diagnosis not present

## 2019-08-28 DIAGNOSIS — F411 Generalized anxiety disorder: Secondary | ICD-10-CM | POA: Diagnosis not present

## 2019-08-28 DIAGNOSIS — F332 Major depressive disorder, recurrent severe without psychotic features: Secondary | ICD-10-CM | POA: Diagnosis not present

## 2019-08-28 DIAGNOSIS — G4700 Insomnia, unspecified: Secondary | ICD-10-CM | POA: Diagnosis not present

## 2019-08-28 MED FILL — LAMOTRIGINE 25 MG TABS: 25 | 90 days supply | Qty: 90 | Fill #0

## 2019-08-28 MED FILL — VIIBRYD 10 MG TABLET: 10 | 90 days supply | Qty: 90 | Fill #0

## 2019-09-04 DIAGNOSIS — F4312 Post-traumatic stress disorder, chronic: Secondary | ICD-10-CM | POA: Diagnosis not present

## 2019-09-04 DIAGNOSIS — F411 Generalized anxiety disorder: Secondary | ICD-10-CM | POA: Diagnosis not present

## 2019-09-04 DIAGNOSIS — F332 Major depressive disorder, recurrent severe without psychotic features: Secondary | ICD-10-CM | POA: Diagnosis not present

## 2019-09-04 DIAGNOSIS — G4700 Insomnia, unspecified: Secondary | ICD-10-CM | POA: Diagnosis not present

## 2019-09-04 MED FILL — ZOLPIDEM TARTRATE 10 MG TAB: 10 | 90 days supply | Qty: 60 | Fill #0

## 2019-09-04 MED FILL — HYDROCHLOROTHIAZIDE 25 MG T: 25 | 90 days supply | Qty: 90 | Fill #2

## 2019-09-04 MED FILL — VYVANSE 20 MG CAPSULE: 20 | 30 days supply | Qty: 30 | Fill #0

## 2019-09-12 DIAGNOSIS — F332 Major depressive disorder, recurrent severe without psychotic features: Secondary | ICD-10-CM | POA: Diagnosis not present

## 2019-09-12 DIAGNOSIS — F410 Panic disorder [episodic paroxysmal anxiety] without agoraphobia: Secondary | ICD-10-CM | POA: Diagnosis not present

## 2019-09-12 DIAGNOSIS — G4700 Insomnia, unspecified: Secondary | ICD-10-CM | POA: Diagnosis not present

## 2019-09-12 DIAGNOSIS — F3489 Other specified persistent mood disorders: Secondary | ICD-10-CM | POA: Diagnosis not present

## 2019-09-12 MED FILL — clonazePAM 0.5 MG TABS: 0.5 | 30 days supply | Qty: 60 | Fill #0

## 2019-09-12 MED FILL — clonazePAM 0.125 MG TBDP: 0.125 | 30 days supply | Qty: 30 | Fill #0

## 2019-09-19 DIAGNOSIS — F340 Cyclothymic disorder: Secondary | ICD-10-CM | POA: Diagnosis not present

## 2019-09-19 DIAGNOSIS — F3489 Other specified persistent mood disorders: Secondary | ICD-10-CM | POA: Diagnosis not present

## 2019-09-19 DIAGNOSIS — F332 Major depressive disorder, recurrent severe without psychotic features: Secondary | ICD-10-CM | POA: Diagnosis not present

## 2019-09-19 DIAGNOSIS — F349 Persistent mood [affective] disorder, unspecified: Secondary | ICD-10-CM | POA: Diagnosis not present

## 2019-10-02 DIAGNOSIS — F332 Major depressive disorder, recurrent severe without psychotic features: Secondary | ICD-10-CM | POA: Diagnosis not present

## 2019-10-02 DIAGNOSIS — G4700 Insomnia, unspecified: Secondary | ICD-10-CM | POA: Diagnosis not present

## 2019-10-02 DIAGNOSIS — F410 Panic disorder [episodic paroxysmal anxiety] without agoraphobia: Secondary | ICD-10-CM | POA: Diagnosis not present

## 2019-10-02 MED FILL — DULoxetine HCL 30 MG CPEP: 30 | 90 days supply | Qty: 90 | Fill #0

## 2019-10-04 DIAGNOSIS — G4700 Insomnia, unspecified: Secondary | ICD-10-CM | POA: Diagnosis not present

## 2019-10-04 DIAGNOSIS — R1013 Epigastric pain: Secondary | ICD-10-CM | POA: Diagnosis not present

## 2019-10-11 DIAGNOSIS — F411 Generalized anxiety disorder: Secondary | ICD-10-CM | POA: Diagnosis not present

## 2019-10-12 ENCOUNTER — Encounter: Payer: Self-pay | Admitting: *Deleted

## 2019-10-12 ENCOUNTER — Ambulatory Visit (INDEPENDENT_AMBULATORY_CARE_PROVIDER_SITE_OTHER): Payer: 59 | Admitting: Internal Medicine

## 2019-10-12 VITALS — Ht 62.25 in | Wt 131.0 lb

## 2019-10-12 DIAGNOSIS — R198 Other specified symptoms and signs involving the digestive system and abdomen: Secondary | ICD-10-CM

## 2019-10-12 DIAGNOSIS — R634 Abnormal weight loss: Secondary | ICD-10-CM | POA: Diagnosis not present

## 2019-10-12 DIAGNOSIS — R1012 Left upper quadrant pain: Secondary | ICD-10-CM

## 2019-10-12 DIAGNOSIS — F419 Anxiety disorder, unspecified: Secondary | ICD-10-CM | POA: Diagnosis not present

## 2019-10-12 DIAGNOSIS — R630 Anorexia: Secondary | ICD-10-CM

## 2019-10-12 DIAGNOSIS — R1011 Right upper quadrant pain: Secondary | ICD-10-CM

## 2019-10-12 DIAGNOSIS — K219 Gastro-esophageal reflux disease without esophagitis: Secondary | ICD-10-CM

## 2019-10-12 DIAGNOSIS — R0989 Other specified symptoms and signs involving the circulatory and respiratory systems: Secondary | ICD-10-CM | POA: Diagnosis not present

## 2019-10-12 NOTE — Progress Notes (Signed)
Subjective:   This service was provided via telemedicine.  D[employer] application The patient was located at home The provider was located in provider's GI office. The patient did consent to this telephone visit and is aware of possible charges through their insurance for this visit.   The persons participating in this telemedicine service were the patient and I. Time spent on call: 36 minutes    Patient ID: Tammy [name], female    DOB: 06/18/1961, 59 y.o.   MRN: [id number]  HPI Tammy [name] is a 59 year old female with a past medical history of hemorrhoids, history of constipation, hypertension, history of neuropathic pain who is seen for follow-up.  She is seen virtually today through the [employer] application with A/V communication and setting of COVID-19 pandemic.  Below are notes that I took during our conversation.  She has been having significant issues this year as discussed below.  She is also had issues with anxiety had stressors related to the pandemic and family issues.  She expresses guilt over some of her anxiousness  09/25/19 COVID vaccine with sore arm, January 7th GI issues including nausea, acid reflux, gas/belching, poor appetite (though weight loss and poor appetite starting before holidays).  Globus.  Very poor appetite, nausea with smell of food.  Rec to start omep by Tammy Sack, RN decreased acid reflux, but still had no appetite, no nausea.  Hard to eat due to symptoms. Stopped omeprazole after about 4-5 days.  Does not really want to take a medication long-term.  Reflux mostly at night and symptoms there in the am.   No fevers.  Weight loss 143-131 and 1/2 in last 14 days.   There has been some medications changes and increased stressors on her family.    Took lexapro over the summer and got noctural panic attacks and poor sleep.  Seeing psychiatry with insomnia and anxiety.  Started benzo at night with clonazepam which she is taking at [facility].  Clonazepam was likely a bridge  to Cymbalta which she is taken in the past.  She was thinking of starting this about 10 days ago but has not started yet.  I performed her colonoscopy on 10/31/2018.  She had internal and external hemorrhoids, diverticulosis.  No polyps  Review of Systems As per HPI, otherwise negative  Current Medications, Allergies, Past Medical History, Past Surgical History, Family History and Social History were reviewed in [employer] record.     Objective:   Physical Exam No physical exam today, virtual visit     Assessment & Plan:  59 year old female with a past medical history of hemorrhoids, history of constipation, hypertension, history of neuropathic pain who is seen for follow-up.   1. GERD/globus sensation/upper abdominal discomfort/poor appetite with weight loss --we discussed her symptoms at length today.  I do think we should exclude primary GI pathology but my suspicion is that her anxiety and the other stressors of this year are the main culprit for her GI symptoms.  I cannot exclude that some of her symptoms are related to the COVID-19 vaccination however most of the symptoms predate the vaccine.  She did note being really anxious before the vaccine having not slept at all the night before she took the shot.  I recommended the following: --CBC, CMP, lipase, H. pylori stool antigen, TSH, celiac panel --Ondansetron ODT 4 mg every 6 hours as needed nausea --Complete abdominal ultrasound --We discussed the pantoprazole trial at 40 mg a day.  She is hesitant  and somewhat nervous about PPI.  We will await lab testing, stool antigen testing and ultrasound results before consideration of PPI. --We also discussed EGD but I would like to perform the above tests and possibly PPI trial before upper endoscopy. --Repeat virtual visit in 4 to 6 weeks  2.  Anxiety --she is working with psychiatry and her psychiatrist recommended that she resume Cymbalta.  I have encouraged her to  do so and I do not think this will upset her GI system she has taken this in the past and tolerated it well.  45 minutes total spent today including patient facing time, coordination of care, reviewing medical history/procedures/pertinent radiology studies, and documentation of the encounter.

## 2019-10-15 ENCOUNTER — Other Ambulatory Visit (INDEPENDENT_AMBULATORY_CARE_PROVIDER_SITE_OTHER): Payer: 59

## 2019-10-15 ENCOUNTER — Telehealth: Payer: Self-pay | Admitting: Internal Medicine

## 2019-10-15 ENCOUNTER — Other Ambulatory Visit: Payer: Self-pay

## 2019-10-15 DIAGNOSIS — K219 Gastro-esophageal reflux disease without esophagitis: Secondary | ICD-10-CM

## 2019-10-15 DIAGNOSIS — R1011 Right upper quadrant pain: Secondary | ICD-10-CM | POA: Diagnosis not present

## 2019-10-15 DIAGNOSIS — R0989 Other specified symptoms and signs involving the circulatory and respiratory systems: Secondary | ICD-10-CM

## 2019-10-15 DIAGNOSIS — R630 Anorexia: Secondary | ICD-10-CM

## 2019-10-15 DIAGNOSIS — R634 Abnormal weight loss: Secondary | ICD-10-CM

## 2019-10-15 DIAGNOSIS — F419 Anxiety disorder, unspecified: Secondary | ICD-10-CM | POA: Diagnosis not present

## 2019-10-15 DIAGNOSIS — R198 Other specified symptoms and signs involving the digestive system and abdomen: Secondary | ICD-10-CM

## 2019-10-15 DIAGNOSIS — R1012 Left upper quadrant pain: Secondary | ICD-10-CM

## 2019-10-15 LAB — CBC WITH DIFFERENTIAL/PLATELET
Basophils Absolute: 0 10*3/uL (ref 0.0–0.1)
Basophils Relative: 0.4 % (ref 0.0–3.0)
Eosinophils Absolute: 0 10*3/uL (ref 0.0–0.7)
Eosinophils Relative: 1.4 % (ref 0.0–5.0)
HCT: 41.4 % (ref 36.0–46.0)
Hemoglobin: 14.1 g/dL (ref 12.0–15.0)
Lymphocytes Relative: 22 % (ref 12.0–46.0)
Lymphs Abs: 0.8 10*3/uL (ref 0.7–4.0)
MCHC: 34.1 g/dL (ref 30.0–36.0)
MCV: 82.2 fl (ref 78.0–100.0)
Monocytes Absolute: 0.2 10*3/uL (ref 0.1–1.0)
Monocytes Relative: 6 % (ref 3.0–12.0)
Neutro Abs: 2.4 10*3/uL (ref 1.4–7.7)
Neutrophils Relative %: 70.2 % (ref 43.0–77.0)
Platelets: 212 10*3/uL (ref 150.0–400.0)
RBC: 5.04 Mil/uL (ref 3.87–5.11)
RDW: 13.5 % (ref 11.5–15.5)
WBC: 3.5 10*3/uL — ABNORMAL LOW (ref 4.0–10.5)

## 2019-10-15 LAB — COMPREHENSIVE METABOLIC PANEL
ALT: 14 U/L (ref 0–35)
AST: 17 U/L (ref 0–37)
Albumin: 4.9 g/dL (ref 3.5–5.2)
Alkaline Phosphatase: 50 U/L (ref 39–117)
BUN: 6 mg/dL (ref 6–23)
CO2: 27 mEq/L (ref 19–32)
Calcium: 10 mg/dL (ref 8.4–10.5)
Chloride: 101 mEq/L (ref 96–112)
Creatinine, Ser: 0.66 mg/dL (ref 0.40–1.20)
GFR: 91.19 mL/min (ref >60.00)
Glucose, Bld: 109 mg/dL — ABNORMAL HIGH (ref 70–99)
Potassium: 3.8 mEq/L (ref 3.5–5.1)
Sodium: 138 mEq/L (ref 135–145)
Total Bilirubin: 0.6 mg/dL (ref 0.2–1.2)
Total Protein: 7.3 g/dL (ref 6.0–8.3)

## 2019-10-15 LAB — IGA: IgA: 106 mg/dL (ref 68–378)

## 2019-10-15 LAB — LIPASE: Lipase: 7 U/L — ABNORMAL LOW (ref 11.0–59.0)

## 2019-10-15 LAB — TSH: TSH: 0.68 u[IU]/mL (ref 0.35–4.50)

## 2019-10-15 MED ORDER — ONDANSETRON 4 MG PO TBDP: 4.0000 mg | ORAL_TABLET | Freq: Four times a day (QID) | ORAL | 0 refills | Status: DC | PRN

## 2019-10-15 MED FILL — ONDANSETRON ODT 4 MG TABLET: 4 | 8 days supply | Qty: 30 | Fill #0

## 2019-10-15 NOTE — Addendum Note (Signed)
Addended by: Larina Bras on: 10/15/2019 11:47 AM   Modules accepted: Orders

## 2019-10-15 NOTE — Telephone Encounter (Signed)
Pt states her zofran prescription was not at the pharmacy. Pt requests script be sent to [facility]. Scripts sent to pharmacy per pt request.

## 2019-10-15 NOTE — Patient Instructions (Addendum)
Your provider has requested that you go to the basement level for lab work. Press "B" on the elevator. The lab is located at the first door on the left as you exit the elevator.  We have sent the following medications to your pharmacy for you to pick up at your convenience: Ondansetron ODT 4 mg every 6 hours as needed nausea  You have been scheduled for an abdominal ultrasound at [facility] (1st floor of hospital) on Friday 10/19/19 at 9:45 am. Please arrive 15 minutes prior to your appointment for registration. Make certain not to have anything to eat or drink 6 hours prior to your appointment. Should you need to reschedule your appointment, please contact radiology at [phone]. This test typically takes about 30 minutes to perform.   Please follow up with Dr Hilarie Fredrickson via virtual visit on 11/29/19 at 2:10 pm.

## 2019-10-16 ENCOUNTER — Ambulatory Visit: Payer: 59 | Admitting: Internal Medicine

## 2019-10-16 ENCOUNTER — Other Ambulatory Visit: Payer: 59

## 2019-10-16 DIAGNOSIS — R1012 Left upper quadrant pain: Secondary | ICD-10-CM | POA: Diagnosis not present

## 2019-10-16 DIAGNOSIS — R630 Anorexia: Secondary | ICD-10-CM

## 2019-10-16 DIAGNOSIS — F419 Anxiety disorder, unspecified: Secondary | ICD-10-CM | POA: Diagnosis not present

## 2019-10-16 DIAGNOSIS — R0989 Other specified symptoms and signs involving the circulatory and respiratory systems: Secondary | ICD-10-CM

## 2019-10-16 DIAGNOSIS — R1011 Right upper quadrant pain: Secondary | ICD-10-CM | POA: Diagnosis not present

## 2019-10-16 DIAGNOSIS — K219 Gastro-esophageal reflux disease without esophagitis: Secondary | ICD-10-CM | POA: Diagnosis not present

## 2019-10-16 DIAGNOSIS — R198 Other specified symptoms and signs involving the digestive system and abdomen: Secondary | ICD-10-CM

## 2019-10-16 DIAGNOSIS — R634 Abnormal weight loss: Secondary | ICD-10-CM

## 2019-10-16 LAB — TISSUE TRANSGLUTAMINASE, IGA: (tTG) Ab, IgA: 1 U/mL

## 2019-10-17 LAB — HELICOBACTER PYLORI  SPECIAL ANTIGEN
MICRO NUMBER:: 10081779
SPECIMEN QUALITY: ADEQUATE

## 2019-10-18 DIAGNOSIS — F410 Panic disorder [episodic paroxysmal anxiety] without agoraphobia: Secondary | ICD-10-CM | POA: Diagnosis not present

## 2019-10-18 DIAGNOSIS — F332 Major depressive disorder, recurrent severe without psychotic features: Secondary | ICD-10-CM | POA: Diagnosis not present

## 2019-10-18 DIAGNOSIS — G4700 Insomnia, unspecified: Secondary | ICD-10-CM | POA: Diagnosis not present

## 2019-10-18 MED FILL — clonazePAM 0.5 MG TABS: 0.5 | 30 days supply | Qty: 60 | Fill #0

## 2019-10-18 MED FILL — ARIPIPRAZOLE 2 MG TABS: 2 | 30 days supply | Qty: 30 | Fill #0

## 2019-10-19 ENCOUNTER — Ambulatory Visit (HOSPITAL_COMMUNITY): Payer: 59

## 2019-10-19 DIAGNOSIS — F411 Generalized anxiety disorder: Secondary | ICD-10-CM | POA: Diagnosis not present

## 2019-10-19 NOTE — Telephone Encounter (Signed)
Please let patient know that I do not think the H. pylori test is wrong as she was off PPI for 7 days prior to this test  If things do not improve with medication we will proceed with upper endoscopy  Have her begin pantoprazole 40 mg once daily, 30 minutes before the first meal the day x4 to 6 weeks

## 2019-10-22 ENCOUNTER — Other Ambulatory Visit: Payer: Self-pay

## 2019-10-22 DIAGNOSIS — T7840XA Allergy, unspecified, initial encounter: Secondary | ICD-10-CM | POA: Diagnosis not present

## 2019-10-22 MED ORDER — PANTOPRAZOLE SODIUM 40 MG PO TBEC: 40.0000 mg | DELAYED_RELEASE_TABLET | Freq: Every day | ORAL | 3 refills | Status: DC

## 2019-10-22 MED FILL — PANTOPRAZOLE SOD DR 40 MG T: 40 | 90 days supply | Qty: 90 | Fill #0

## 2019-10-24 DIAGNOSIS — F410 Panic disorder [episodic paroxysmal anxiety] without agoraphobia: Secondary | ICD-10-CM | POA: Diagnosis not present

## 2019-10-24 DIAGNOSIS — F332 Major depressive disorder, recurrent severe without psychotic features: Secondary | ICD-10-CM | POA: Diagnosis not present

## 2019-10-24 DIAGNOSIS — G4700 Insomnia, unspecified: Secondary | ICD-10-CM | POA: Diagnosis not present

## 2019-10-24 MED FILL — FLUOXETINE HCL 10 MG TABS: 10 | 33 days supply | Qty: 30 | Fill #0

## 2019-10-24 MED FILL — PROPRANOLOL 10 MG TABLET: 10 | 30 days supply | Qty: 60 | Fill #0

## 2019-10-26 DIAGNOSIS — F411 Generalized anxiety disorder: Secondary | ICD-10-CM | POA: Diagnosis not present

## 2019-10-26 DIAGNOSIS — F410 Panic disorder [episodic paroxysmal anxiety] without agoraphobia: Secondary | ICD-10-CM | POA: Diagnosis not present

## 2019-10-26 DIAGNOSIS — F332 Major depressive disorder, recurrent severe without psychotic features: Secondary | ICD-10-CM | POA: Diagnosis not present

## 2019-10-26 DIAGNOSIS — G4700 Insomnia, unspecified: Secondary | ICD-10-CM | POA: Diagnosis not present

## 2019-10-31 ENCOUNTER — Other Ambulatory Visit (HOSPITAL_BASED_OUTPATIENT_CLINIC_OR_DEPARTMENT_OTHER): Payer: Self-pay | Admitting: Family Medicine

## 2019-10-31 DIAGNOSIS — Z1231 Encounter for screening mammogram for malignant neoplasm of breast: Secondary | ICD-10-CM

## 2019-11-02 ENCOUNTER — Other Ambulatory Visit: Payer: Self-pay

## 2019-11-02 ENCOUNTER — Ambulatory Visit (HOSPITAL_COMMUNITY)
Admission: RE | Admit: 2019-11-02 | Discharge: 2019-11-02 | Disposition: A | Discharge status: Home / Self Care | Payer: 59 | Source: Ambulatory Visit | Attending: Internal Medicine | Admitting: Internal Medicine

## 2019-11-02 DIAGNOSIS — R630 Anorexia: Secondary | ICD-10-CM

## 2019-11-02 DIAGNOSIS — F411 Generalized anxiety disorder: Secondary | ICD-10-CM | POA: Diagnosis not present

## 2019-11-02 DIAGNOSIS — R109 Unspecified abdominal pain: Secondary | ICD-10-CM | POA: Diagnosis not present

## 2019-11-02 DIAGNOSIS — R634 Abnormal weight loss: Secondary | ICD-10-CM | POA: Diagnosis not present

## 2019-11-02 DIAGNOSIS — R1011 Right upper quadrant pain: Secondary | ICD-10-CM | POA: Insufficient documentation

## 2019-11-02 DIAGNOSIS — R1012 Left upper quadrant pain: Secondary | ICD-10-CM | POA: Diagnosis not present

## 2019-11-02 DIAGNOSIS — K219 Gastro-esophageal reflux disease without esophagitis: Secondary | ICD-10-CM

## 2019-11-03 DIAGNOSIS — F332 Major depressive disorder, recurrent severe without psychotic features: Secondary | ICD-10-CM | POA: Diagnosis not present

## 2019-11-03 DIAGNOSIS — G4700 Insomnia, unspecified: Secondary | ICD-10-CM | POA: Diagnosis not present

## 2019-11-05 MED FILL — FLUoxetine HCL 20 MG CAPS: 20 | 90 days supply | Qty: 90 | Fill #0

## 2019-11-09 DIAGNOSIS — F411 Generalized anxiety disorder: Secondary | ICD-10-CM | POA: Diagnosis not present

## 2019-11-13 DIAGNOSIS — G4700 Insomnia, unspecified: Secondary | ICD-10-CM | POA: Diagnosis not present

## 2019-11-13 DIAGNOSIS — F332 Major depressive disorder, recurrent severe without psychotic features: Secondary | ICD-10-CM | POA: Diagnosis not present

## 2019-11-13 MED FILL — MIRTAZAPINE 7.5 MG TABLET: 7.5 | 30 days supply | Qty: 30 | Fill #0

## 2019-11-14 MED FILL — ARIPIPRAZOLE 2 MG TABS: 2 | 90 days supply | Qty: 90 | Fill #0

## 2019-11-16 DIAGNOSIS — F411 Generalized anxiety disorder: Secondary | ICD-10-CM | POA: Diagnosis not present

## 2019-11-20 MED FILL — FLUoxetine HCL 10 MG CAPS: 10 | 30 days supply | Qty: 30 | Fill #0

## 2019-11-26 ENCOUNTER — Ambulatory Visit (HOSPITAL_BASED_OUTPATIENT_CLINIC_OR_DEPARTMENT_OTHER)
Admission: RE | Admit: 2019-11-26 | Discharge: 2019-11-26 | Disposition: A | Discharge status: Home / Self Care | Payer: 59 | Source: Ambulatory Visit | Attending: Family Medicine | Admitting: Family Medicine

## 2019-11-26 ENCOUNTER — Other Ambulatory Visit: Payer: Self-pay

## 2019-11-26 DIAGNOSIS — Z1231 Encounter for screening mammogram for malignant neoplasm of breast: Secondary | ICD-10-CM | POA: Insufficient documentation

## 2019-11-27 DIAGNOSIS — G4700 Insomnia, unspecified: Secondary | ICD-10-CM | POA: Diagnosis not present

## 2019-11-27 DIAGNOSIS — F332 Major depressive disorder, recurrent severe without psychotic features: Secondary | ICD-10-CM | POA: Diagnosis not present

## 2019-11-27 MED FILL — FLUoxetine HCL 40 MG CAPS: 40 | 90 days supply | Qty: 90 | Fill #0

## 2019-11-29 ENCOUNTER — Ambulatory Visit (INDEPENDENT_AMBULATORY_CARE_PROVIDER_SITE_OTHER): Payer: 59 | Admitting: Internal Medicine

## 2019-11-29 DIAGNOSIS — R1013 Epigastric pain: Secondary | ICD-10-CM | POA: Diagnosis not present

## 2019-11-29 DIAGNOSIS — K219 Gastro-esophageal reflux disease without esophagitis: Secondary | ICD-10-CM | POA: Diagnosis not present

## 2019-11-29 NOTE — Progress Notes (Signed)
Subjective:    Patient ID: Tammy [name], female    DOB: 08/12/1961, 59 y.o.   MRN: [id number]  This service was provided via telemedicine.  Doximity application with A/V communication The patient was located at home The provider was located in provider's GI office. The patient did consent to this telephone visit and is aware of possible charges through their insurance for this visit.   The persons participating in this telemedicine service were the patient and I. Time spent on call: 25 minutes   HPI Tammy [name] is a 59 year old female with a past medical history of hemorrhoids, constipation, hypertension, history of neuropathic pain, panic disorder, GERD and dyspepsia who seen for follow-up.  She is seen virtually today in the setting of COVID-19 pandemic.  She is seen with the [employer] application with A/V communication.  She was last seen on 10/12/2019.  Since her last visit we did an abdominal ultrasound which was normal.  She also started pantoprazole 40 mg a day around 10/25/2019.  With this symptoms have improved though not completely resolved.  Her appetite is much better.  Her epigastric pain is better but can still be present occasionally for much more mildly.  Bloating sensation is better.  Nausea is better though can also be present rarely.  She has gained about 6 pounds after having lost weight previously.  She is having some nocturnal symptoms such as a bitter taste in her mouth and throat.  She was started on fluoxetine and has titrated up recently to 40 mg a day for panic disorder.  This seems to be helping.  She has tried to eat a GERD conscious diet though she is eating some chocolate.  Review of Systems As per HPI, otherwise negative  Current Medications, Allergies, Past Medical History, Past Surgical History, Family History and Social History were reviewed in [employer] record.     Objective:   Physical Exam No PE, virtual visit Very  well-appearing by video chat today, no acute distress  ABDOMEN ULTRASOUND COMPLETE   COMPARISON:  None.   FINDINGS: Gallbladder: No gallstones or wall thickening visualized. No sonographic Murphy sign noted by sonographer.   Common bile duct: Diameter: 3 mm   Liver: No focal lesion identified. Within normal limits in parenchymal echogenicity. Portal vein is patent on color Doppler imaging with normal direction of blood flow towards the liver.   IVC: No abnormality visualized.   Pancreas: Visualized portion unremarkable.   Spleen: Size and appearance within normal limits.   Right Kidney: Length: 10.5 cm. Echogenicity within normal limits. No mass or hydronephrosis visualized.   Left Kidney: Length: 11.1 cm. Echogenicity within normal limits. No mass or hydronephrosis visualized.   Abdominal aorta: No aneurysm visualized.   Other findings: None.   IMPRESSION: No acute abnormality noted.     Electronically Signed   By: Inez Catalina M.D.   On: 11/02/2019 09:37  HP stool Ag negative  CBC    Component Value Date/Time   WBC 3.5 (L) 10/15/2019 1345   RBC 5.04 10/15/2019 1345   HGB 14.1 10/15/2019 1345   HCT 41.4 10/15/2019 1345   PLT 212.0 10/15/2019 1345   MCV 82.2 10/15/2019 1345   MCH 27.6 09/18/2012 0525   MCHC 34.1 10/15/2019 1345   RDW 13.5 10/15/2019 1345   LYMPHSABS 0.8 10/15/2019 1345   MONOABS 0.2 10/15/2019 1345   EOSABS 0.0 10/15/2019 1345   BASOSABS 0.0 10/15/2019 1345   CMP     Component  Value Date/Time   NA 138 10/15/2019 1345   K 3.8 10/15/2019 1345   CL 101 10/15/2019 1345   CO2 27 10/15/2019 1345   GLUCOSE 109 (H) 10/15/2019 1345   BUN 6 10/15/2019 1345   CREATININE 0.66 10/15/2019 1345   CALCIUM 10.0 10/15/2019 1345   PROT 7.3 10/15/2019 1345   ALBUMIN 4.9 10/15/2019 1345   AST 17 10/15/2019 1345   ALT 14 10/15/2019 1345   ALKPHOS 50 10/15/2019 1345   BILITOT 0.6 10/15/2019 1345   GFRNONAA >90 09/18/2012 0525   GFRAA >90  09/18/2012 0525   TSH normal Celiac panel negative Lipase normal     Assessment & Plan:  59 year old female with a past medical history of hemorrhoids, constipation, hypertension, history of neuropathic pain, panic disorder, GERD and dyspepsia who seen for follow-up.  1.  GERD/dyspepsia --exacerbation of probable underlying GERD but much more symptomatic in the last 2 months.  Unclear what caused this exacerbation though likely somewhat stress related.  Also interestingly worsened after her first COVID-19 vaccination in early January.  She has responded favorably to pantoprazole 40 mg a day.  She would like to use this for a short period if at all possible.  Given some of her nocturnal symptoms I suggested she had famotidine 20 mg at bedtime on an as-needed basis.  We discussed continuing GERD diet, avoiding known trigger foods, trying to not eat or drink within 1 to 2 hours of lying down and considering elevating the head of her bed or sleeping on a wedge. --Continue pantoprazole 40 mg a day until 01/19/2020; after this we will try to remove PPI and I suggested she use famotidine 20 mg twice daily until 02/02/2020 and then she can stop --Can use famotidine 20 mg nightly as needed now --GERD diet and lifestyle --Follow-up with me in early May by virtual visit versus in person visit; please arrange follow-up  30 minutes total spent today including patient facing time, coordination of care, reviewing medical history/procedures/pertinent radiology studies, and documentation of the encounter.

## 2019-11-30 DIAGNOSIS — F411 Generalized anxiety disorder: Secondary | ICD-10-CM | POA: Diagnosis not present

## 2019-12-07 DIAGNOSIS — F410 Panic disorder [episodic paroxysmal anxiety] without agoraphobia: Secondary | ICD-10-CM | POA: Diagnosis not present

## 2019-12-07 DIAGNOSIS — F411 Generalized anxiety disorder: Secondary | ICD-10-CM | POA: Diagnosis not present

## 2019-12-14 DIAGNOSIS — F411 Generalized anxiety disorder: Secondary | ICD-10-CM | POA: Diagnosis not present

## 2019-12-14 DIAGNOSIS — F410 Panic disorder [episodic paroxysmal anxiety] without agoraphobia: Secondary | ICD-10-CM | POA: Diagnosis not present

## 2019-12-17 MED FILL — clonazePAM 0.5 MG TABS: 0.5 | 90 days supply | Qty: 180 | Fill #0

## 2020-01-04 DIAGNOSIS — F411 Generalized anxiety disorder: Secondary | ICD-10-CM | POA: Diagnosis not present

## 2020-01-04 DIAGNOSIS — F410 Panic disorder [episodic paroxysmal anxiety] without agoraphobia: Secondary | ICD-10-CM | POA: Diagnosis not present

## 2020-01-06 DIAGNOSIS — G4700 Insomnia, unspecified: Secondary | ICD-10-CM | POA: Diagnosis not present

## 2020-01-06 DIAGNOSIS — F332 Major depressive disorder, recurrent severe without psychotic features: Secondary | ICD-10-CM | POA: Diagnosis not present

## 2020-01-10 DIAGNOSIS — G4700 Insomnia, unspecified: Secondary | ICD-10-CM | POA: Diagnosis not present

## 2020-01-24 DIAGNOSIS — F410 Panic disorder [episodic paroxysmal anxiety] without agoraphobia: Secondary | ICD-10-CM | POA: Diagnosis not present

## 2020-01-24 DIAGNOSIS — F411 Generalized anxiety disorder: Secondary | ICD-10-CM | POA: Diagnosis not present

## 2020-02-04 DIAGNOSIS — F909 Attention-deficit hyperactivity disorder, unspecified type: Secondary | ICD-10-CM | POA: Diagnosis not present

## 2020-02-04 DIAGNOSIS — F332 Major depressive disorder, recurrent severe without psychotic features: Secondary | ICD-10-CM | POA: Diagnosis not present

## 2020-02-04 DIAGNOSIS — F429 Obsessive-compulsive disorder, unspecified: Secondary | ICD-10-CM | POA: Diagnosis not present

## 2020-02-04 DIAGNOSIS — G4709 Other insomnia: Secondary | ICD-10-CM | POA: Diagnosis not present

## 2020-02-04 MED FILL — REXULTI 0.5 MG TABLET: 0.5 | 90 days supply | Qty: 90 | Fill #0

## 2020-02-07 DIAGNOSIS — F411 Generalized anxiety disorder: Secondary | ICD-10-CM | POA: Diagnosis not present

## 2020-02-07 DIAGNOSIS — F410 Panic disorder [episodic paroxysmal anxiety] without agoraphobia: Secondary | ICD-10-CM | POA: Diagnosis not present

## 2020-02-12 DIAGNOSIS — F429 Obsessive-compulsive disorder, unspecified: Secondary | ICD-10-CM | POA: Diagnosis not present

## 2020-02-12 DIAGNOSIS — G4709 Other insomnia: Secondary | ICD-10-CM | POA: Diagnosis not present

## 2020-02-12 DIAGNOSIS — F332 Major depressive disorder, recurrent severe without psychotic features: Secondary | ICD-10-CM | POA: Diagnosis not present

## 2020-02-12 DIAGNOSIS — F909 Attention-deficit hyperactivity disorder, unspecified type: Secondary | ICD-10-CM | POA: Diagnosis not present

## 2020-02-12 MED FILL — FLUoxetine HCL 40 MG CAPS: 40 | 90 days supply | Qty: 90 | Fill #0

## 2020-02-12 NOTE — Progress Notes (Signed)
Phone: [phone]   Subjective:  Patient presents today for their annual physical. Chief complaint-noted.   See problem oriented charting- Review of Systems  Constitutional: Negative for chills and fever.  HENT: Negative for hearing loss and tinnitus.   Eyes: Negative for blurred vision and double vision.  Respiratory: Negative for cough and hemoptysis.   Cardiovascular: Negative for chest pain and palpitations.  Gastrointestinal: Positive for constipation. Negative for heartburn, nausea and vomiting.  Genitourinary: Negative for dysuria and frequency.  Musculoskeletal: Positive for joint pain and myalgias.  Skin: Negative for itching and rash.  Neurological: Negative for dizziness and headaches.  Endo/Heme/Allergies: Positive for environmental allergies. Negative for polydipsia.  Psychiatric/Behavioral: Positive for depression. Negative for hallucinations, substance abuse and suicidal ideas. The patient is nervous/anxious and has insomnia (treated).    The following were reviewed and entered/updated in epic: Past Medical History:  Diagnosis Date  . Arthritis   . Chronic fatigue syndrome   . Diverticulosis   . External hemorrhoids   . Hypertension   . Internal hemorrhoids   . Neuropathy    Patient Active Problem List   Diagnosis Date Noted  . 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[place]) 02/15/2020    Priority: Medium  . Essential hypertension 02/18/2014    Priority: Medium  . Hyperlipidemia 09/18/2012    Priority: Medium  . Allergic rhinitis 03/09/2016    Priority: Low  . Chronic pain syndrome 02/22/2013    Priority: Low  . Chronic insomnia 02/22/2013    Priority: Low  . Shortness of breath 11/29/2012    Priority: Low  . Paresthesias 10/25/2012    Priority: Low  . Chest pain 09/17/2012    Priority: Low  . Constipation 10/23/2008    Priority: Low   Past Surgical History:  Procedure Laterality Date  . CARDIAC CATHETERIZATION  09/18/2012  . LEFT HEART  CATHETERIZATION WITH CORONARY ANGIOGRAM N/A 09/18/2012   Procedure: LEFT HEART CATHETERIZATION WITH CORONARY ANGIOGRAM;  Surgeon: Sherren Mocha, MD;  Location: [facility] CATH LAB;  Service: Cardiovascular;  Laterality: N/A;  . TONSILLECTOMY  1965    Family History  Problem Relation Age of Onset  . Heart attack Maternal Grandmother 50  . Arthritis Maternal Grandmother   . Hyperlipidemia Maternal Grandmother   . Heart disease Maternal Grandmother   . Diabetes Maternal Grandmother   . Pancreatic cancer Mother        doing well 2 years post diagnosis in 2021 at 7.   . Colon polyps Father   . Healthy Sister   . Healthy Brother   . Heart attack Brother        age 7  . Colon cancer Neg Hx   . Esophageal cancer Neg Hx   . Kidney disease Neg Hx     Medications- reviewed and updated Current Outpatient Medications  Medication Sig Dispense Refill  . ACETAMINOPHEN-CAFFEINE PO Take by mouth.    . Brexpiprazole (REXULTI) 0.5 MG TABS Take 1 tablet by mouth at bedtime.    . clonazePAM (KLONOPIN) 0.5 MG tablet Take 0.5 mg by mouth at bedtime.    Marland Kitchen FLUoxetine (PROZAC) 40 MG capsule Take 40 mg by mouth daily.    Marland Kitchen MAGNESIUM CHLORIDE-CALCIUM PO Take by mouth.    . Melatonin 1 MG/4ML LIQD Take by mouth.    . Probiotic Product (PROBIOTIC DAILY PO) Take by mouth.    . hydrochlorothiazide (HYDRODIURIL) 12.5 MG tablet Take 1 tablet (12.5 mg total) by mouth daily. 90 tablet 3   No current facility-administered  medications for this visit.    Allergies-reviewed and updated Allergies  Allergen Reactions  . Eggs Or Egg-Derived Products Other (See Comments)    Makes pt feel like burping up rotten eggs    Social History   Social History Narrative   Married. 2 adopted children- adopted daughter (67) when single after first marriage from [place]. Son from [place] with 2nd husband(18) in 2021. 1st husband local physician Linus Mako      LCSW with [employer]      Hobbies: animal rescue- has litter  of kittens in 2021, walking, time outside   Objective  Objective:  BP 112/82   Pulse 72   Temp 98.4 F (36.9 C) (Temporal)   Ht 5' 2.25" (1.581 m)   Wt 149 lb (67.6 kg)   LMP 01/26/2011   SpO2 97%   BMI 27.03 kg/m  Gen: NAD, resting comfortably HEENT: Mucous membranes are moist. Oropharynx normal Neck: no thyromegaly CV: RRR no murmurs rubs or gallops Lungs: CTAB no crackles, wheeze, rhonchi Abdomen: soft/nontender/nondistendednormal bowel sounds. No rebound or guarding.  Ext: no edema Skin: warm, dry Neuro: grossly normal, moves all extremities, PERRLA   Assessment and Plan   59 y.o. female presenting for annual physical.  Health Maintenance counseling: 1. Anticipatory guidance: Patient counseled regarding regular dental exams -q6 months, eye exams - yearly,  avoiding smoking and second hand smoke , limiting alcohol to 1 beverage per day- doesn't drink .   2. Risk factor reduction:  Advised patient of need for regular exercise and diet rich and fruits and vegetables to reduce risk of heart attack and stroke. Exercise- every day walking 45 mins at least. Diet- reasonably healthy but feels she could cut down on sugar.  Lost weight with bad anxiety/depression- then abilify also raised weight - she is going to work on this. Her main goal is to cut down on sugar.  Wt Readings from Last 3 Encounters:  02/15/20 149 lb (67.6 kg)  10/12/19 131 lb (59.4 kg)  10/31/18 155 lb (70.3 kg)  3. Immunizations/screenings/ancillary studies- has had shingles in past- wants to do shingrix next year Immunization History  Administered Date(s) Administered  . Influenza,inj,Quad PF,6+ Mos 06/19/2013  . PFIZER SARS-COV-2 Vaccination 09/25/2019, 10/16/2019  . Tdap 08/22/2014   4. Cervical cancer screening- 11/2020 due- we will get records from 2019 5. Breast cancer screening-  breast exam with PCP and mammogram - 11/26/2019 6. Colon cancer screening - 10/31/2018 with 10 year repeat 7. Skin cancer  screening- no dermatologist. advised regular sunscreen use. Denies worrisome, changing, or new skin lesions.  8. Birth control/STD check- postmenopausal, mongomous 9. Osteoporosis screening at 51- reports normal in the past- will repeat at 45 -Never smoker  Status of chronic or acute concerns   #hypertension S: medication: hctz 12.5 mg Home readings #s: does not check BP Readings from Last 3 Encounters:  02/15/20 112/82  10/31/18 104/67  10/12/18 106/62  A/P: Stable. Continue current medications.   #hyperlipidemia S: Medication:none . Myalgias on statin in the past.  Lab Results  Component Value Date   CHOL 213 (H) 08/12/2015   HDL 74.10 08/12/2015   LDLCALC 124 (H) 08/12/2015   TRIG 76.0 08/12/2015   CHOLHDL 3 08/12/2015   A/P: update lipid panel with labs  # Tylenol/caffeine helps with energy and AM pain  #fatigue- wants to check vitamin b12 with labs  #We also went through her problem list and wrote some notes in the overview for several prior diagnoses listed as  low priority which seem to be doing much better now.   Recommended follow up: Return in about 6 months (around 08/17/2020) for follow up- or sooner if needed. may do 1 year CPE  Meds ordered this encounter  Medications  . hydrochlorothiazide (HYDRODIURIL) 12.5 MG tablet    Sig: Take 1 tablet (12.5 mg total) by mouth daily.    Dispense:  90 tablet    Refill:  3   Return precautions advised. Garret Reddish, MD

## 2020-02-12 NOTE — Patient Instructions (Addendum)
Sign release of information at the check out desk for last pap smear from [facility] as well as any immunizations.  Would also be reasonable to get last 2 years of blood work and visits.  Great to see you today!  Thrilled to have you as a patient   Return in about 6 months (around 08/17/2020) for follow up- or sooner if needed. If blood pressure good at home I dont mind one year follow up- happy to see you sooner if needed!

## 2020-02-15 ENCOUNTER — Encounter: Payer: Self-pay | Admitting: Family Medicine

## 2020-02-15 ENCOUNTER — Ambulatory Visit (INDEPENDENT_AMBULATORY_CARE_PROVIDER_SITE_OTHER): Payer: 59 | Admitting: Family Medicine

## 2020-02-15 ENCOUNTER — Other Ambulatory Visit: Payer: Self-pay

## 2020-02-15 ENCOUNTER — Other Ambulatory Visit: Payer: Self-pay | Admitting: Family Medicine

## 2020-02-15 VITALS — BP 112/82 | HR 72 | Temp 98.4°F | Ht 62.25 in | Wt 149.0 lb

## 2020-02-15 DIAGNOSIS — K5900 Constipation, unspecified: Secondary | ICD-10-CM

## 2020-02-15 DIAGNOSIS — Z0000 Encounter for general adult medical examination without abnormal findings: Secondary | ICD-10-CM | POA: Diagnosis not present

## 2020-02-15 DIAGNOSIS — E785 Hyperlipidemia, unspecified: Secondary | ICD-10-CM

## 2020-02-15 DIAGNOSIS — R5383 Other fatigue: Secondary | ICD-10-CM | POA: Diagnosis not present

## 2020-02-15 DIAGNOSIS — F5104 Psychophysiologic insomnia: Secondary | ICD-10-CM

## 2020-02-15 DIAGNOSIS — R0602 Shortness of breath: Secondary | ICD-10-CM | POA: Diagnosis not present

## 2020-02-15 DIAGNOSIS — E876 Hypokalemia: Secondary | ICD-10-CM

## 2020-02-15 DIAGNOSIS — F324 Major depressive disorder, single episode, in partial remission: Secondary | ICD-10-CM | POA: Diagnosis not present

## 2020-02-15 DIAGNOSIS — J309 Allergic rhinitis, unspecified: Secondary | ICD-10-CM

## 2020-02-15 DIAGNOSIS — F325 Major depressive disorder, single episode, in full remission: Secondary | ICD-10-CM | POA: Insufficient documentation

## 2020-02-15 DIAGNOSIS — I10 Essential (primary) hypertension: Secondary | ICD-10-CM

## 2020-02-15 DIAGNOSIS — K219 Gastro-esophageal reflux disease without esophagitis: Secondary | ICD-10-CM | POA: Insufficient documentation

## 2020-02-15 DIAGNOSIS — G894 Chronic pain syndrome: Secondary | ICD-10-CM

## 2020-02-15 DIAGNOSIS — R202 Paresthesia of skin: Secondary | ICD-10-CM

## 2020-02-15 MED ORDER — HYDROCHLOROTHIAZIDE 12.5 MG PO TABS: 12.5000 mg | ORAL_TABLET | Freq: Every day | ORAL | 3 refills | Status: DC

## 2020-02-15 MED FILL — HYDROCHLOROTHIAZIDE 12.5 MG: 12.5 | 90 days supply | Qty: 90 | Fill #0

## 2020-02-15 NOTE — Assessment & Plan Note (Signed)
#  hypertension S: medication: hctz 12.5 mg Home readings #s: does not check BP Readings from Last 3 Encounters:  02/15/20 112/82  10/31/18 104/67  10/12/18 106/62  A/P: Stable. Continue current medications.

## 2020-02-15 NOTE — Assessment & Plan Note (Signed)
Working with psychiatry Dr. Daron Offer. prozac 40, rexulti, clonazepam weaning off- nocturnal panic.  She states she is improving and will continue close follow-up-she also sees a therapist.  She seems to be in good hands.  No suicidal ideation

## 2020-02-15 NOTE — Assessment & Plan Note (Signed)
S: Medication:none . Myalgias on statin in the past.  Lab Results  Component Value Date   CHOL 213 (H) 08/12/2015   HDL 74.10 08/12/2015   LDLCALC 124 (H) 08/12/2015   TRIG 76.0 08/12/2015   CHOLHDL 3 08/12/2015   A/P: update lipid panel with labs

## 2020-02-16 LAB — LIPID PANEL
Cholesterol: 233 mg/dL — ABNORMAL HIGH (ref <200)
HDL: 87 mg/dL (ref >=50)
LDL Cholesterol (Calc): 130 mg/dL (calc) — ABNORMAL HIGH
Non-HDL Cholesterol (Calc): 146 mg/dL (calc) — ABNORMAL HIGH (ref <130)
Total CHOL/HDL Ratio: 2.7 (calc) (ref <5.0)
Triglycerides: 68 mg/dL (ref <150)

## 2020-02-16 LAB — CBC WITH DIFFERENTIAL/PLATELET
Absolute Monocytes: 337 cells/uL (ref 200–950)
Basophils Absolute: 10 cells/uL (ref 0–200)
Basophils Relative: 0.3 %
Eosinophils Absolute: 122 cells/uL (ref 15–500)
Eosinophils Relative: 3.6 %
HCT: 39.2 % (ref 35.0–45.0)
Hemoglobin: 12.9 g/dL (ref 11.7–15.5)
Lymphs Abs: 955 cells/uL (ref 850–3900)
MCH: 28 pg (ref 27.0–33.0)
MCHC: 32.9 g/dL (ref 32.0–36.0)
MCV: 85.2 fL (ref 80.0–100.0)
MPV: 10.3 fL (ref 7.5–12.5)
Monocytes Relative: 9.9 %
Neutro Abs: 1975 cells/uL (ref 1500–7800)
Neutrophils Relative %: 58.1 %
Platelets: 181 10*3/uL (ref 140–400)
RBC: 4.6 10*6/uL (ref 3.80–5.10)
RDW: 12.7 % (ref 11.0–15.0)
Total Lymphocyte: 28.1 %
WBC: 3.4 10*3/uL — ABNORMAL LOW (ref 3.8–10.8)

## 2020-02-16 LAB — COMPREHENSIVE METABOLIC PANEL
AG Ratio: 2 (calc) (ref 1.0–2.5)
ALT: 16 U/L (ref 6–29)
AST: 18 U/L (ref 10–35)
Albumin: 4.4 g/dL (ref 3.6–5.1)
Alkaline phosphatase (APISO): 52 U/L (ref 37–153)
BUN: 16 mg/dL (ref 7–25)
CO2: 26 mmol/L (ref 20–32)
Calcium: 9.4 mg/dL (ref 8.6–10.4)
Chloride: 103 mmol/L (ref 98–110)
Creat: 0.83 mg/dL (ref 0.50–0.99)
Globulin: 2.2 g/dL (calc) (ref 1.9–3.7)
Glucose, Bld: 87 mg/dL (ref 65–99)
Potassium: 3.8 mmol/L (ref 3.5–5.3)
Sodium: 138 mmol/L (ref 135–146)
Total Bilirubin: 0.3 mg/dL (ref 0.2–1.2)
Total Protein: 6.6 g/dL (ref 6.1–8.1)

## 2020-02-16 LAB — VITAMIN B12: Vitamin B-12: 496 pg/mL (ref 200–1100)

## 2020-02-16 LAB — VITAMIN D 25 HYDROXY (VIT D DEFICIENCY, FRACTURES): Vit D, 25-Hydroxy: 36 ng/mL (ref 30–100)

## 2020-02-17 DIAGNOSIS — F909 Attention-deficit hyperactivity disorder, unspecified type: Secondary | ICD-10-CM | POA: Diagnosis not present

## 2020-02-17 DIAGNOSIS — G4709 Other insomnia: Secondary | ICD-10-CM | POA: Diagnosis not present

## 2020-02-17 DIAGNOSIS — F332 Major depressive disorder, recurrent severe without psychotic features: Secondary | ICD-10-CM | POA: Diagnosis not present

## 2020-02-17 DIAGNOSIS — F429 Obsessive-compulsive disorder, unspecified: Secondary | ICD-10-CM | POA: Diagnosis not present

## 2020-02-21 DIAGNOSIS — F339 Major depressive disorder, recurrent, unspecified: Secondary | ICD-10-CM | POA: Diagnosis not present

## 2020-02-21 DIAGNOSIS — F410 Panic disorder [episodic paroxysmal anxiety] without agoraphobia: Secondary | ICD-10-CM | POA: Diagnosis not present

## 2020-03-03 DIAGNOSIS — G4709 Other insomnia: Secondary | ICD-10-CM | POA: Diagnosis not present

## 2020-03-03 DIAGNOSIS — F332 Major depressive disorder, recurrent severe without psychotic features: Secondary | ICD-10-CM | POA: Diagnosis not present

## 2020-03-03 DIAGNOSIS — F909 Attention-deficit hyperactivity disorder, unspecified type: Secondary | ICD-10-CM | POA: Diagnosis not present

## 2020-03-03 DIAGNOSIS — F429 Obsessive-compulsive disorder, unspecified: Secondary | ICD-10-CM | POA: Diagnosis not present

## 2020-03-03 MED FILL — METHYLPHENIDATE HCL 5 MG TA: 5 | 30 days supply | Qty: 60 | Fill #0

## 2020-03-06 DIAGNOSIS — F410 Panic disorder [episodic paroxysmal anxiety] without agoraphobia: Secondary | ICD-10-CM | POA: Diagnosis not present

## 2020-03-06 DIAGNOSIS — F411 Generalized anxiety disorder: Secondary | ICD-10-CM | POA: Diagnosis not present

## 2020-03-06 DIAGNOSIS — F339 Major depressive disorder, recurrent, unspecified: Secondary | ICD-10-CM | POA: Diagnosis not present

## 2020-03-11 ENCOUNTER — Encounter: Payer: Self-pay | Admitting: Family Medicine

## 2020-03-17 ENCOUNTER — Encounter: Payer: Self-pay | Admitting: Family Medicine

## 2020-03-17 DIAGNOSIS — M8580 Other specified disorders of bone density and structure, unspecified site: Secondary | ICD-10-CM | POA: Insufficient documentation

## 2020-03-17 DIAGNOSIS — M859 Disorder of bone density and structure, unspecified: Secondary | ICD-10-CM | POA: Insufficient documentation

## 2020-03-21 DIAGNOSIS — F411 Generalized anxiety disorder: Secondary | ICD-10-CM | POA: Diagnosis not present

## 2020-03-21 DIAGNOSIS — F410 Panic disorder [episodic paroxysmal anxiety] without agoraphobia: Secondary | ICD-10-CM | POA: Diagnosis not present

## 2020-03-22 DIAGNOSIS — F429 Obsessive-compulsive disorder, unspecified: Secondary | ICD-10-CM | POA: Diagnosis not present

## 2020-03-22 DIAGNOSIS — F909 Attention-deficit hyperactivity disorder, unspecified type: Secondary | ICD-10-CM | POA: Diagnosis not present

## 2020-03-22 DIAGNOSIS — F332 Major depressive disorder, recurrent severe without psychotic features: Secondary | ICD-10-CM | POA: Diagnosis not present

## 2020-03-22 DIAGNOSIS — G4709 Other insomnia: Secondary | ICD-10-CM | POA: Diagnosis not present

## 2020-03-22 MED FILL — ARIPIPRAZOLE 2 MG TABS: 2 | 90 days supply | Qty: 90 | Fill #0

## 2020-03-22 MED FILL — MIRTAZAPINE 7.5 MG TABLET: 7.5 | 90 days supply | Qty: 90 | Fill #0

## 2020-03-31 ENCOUNTER — Other Ambulatory Visit: Payer: Self-pay

## 2020-03-31 ENCOUNTER — Encounter: Payer: Self-pay | Admitting: Family Medicine

## 2020-03-31 ENCOUNTER — Ambulatory Visit: Payer: 59 | Admitting: Family Medicine

## 2020-03-31 VITALS — BP 130/80 | HR 81 | Temp 97.3°F | Ht 62.25 in | Wt 146.0 lb

## 2020-03-31 DIAGNOSIS — F332 Major depressive disorder, recurrent severe without psychotic features: Secondary | ICD-10-CM | POA: Diagnosis not present

## 2020-03-31 DIAGNOSIS — F339 Major depressive disorder, recurrent, unspecified: Secondary | ICD-10-CM | POA: Diagnosis not present

## 2020-03-31 DIAGNOSIS — M5410 Radiculopathy, site unspecified: Secondary | ICD-10-CM | POA: Diagnosis not present

## 2020-03-31 DIAGNOSIS — M4802 Spinal stenosis, cervical region: Secondary | ICD-10-CM

## 2020-03-31 DIAGNOSIS — F324 Major depressive disorder, single episode, in partial remission: Secondary | ICD-10-CM

## 2020-03-31 DIAGNOSIS — F429 Obsessive-compulsive disorder, unspecified: Secondary | ICD-10-CM | POA: Diagnosis not present

## 2020-03-31 DIAGNOSIS — F988 Other specified behavioral and emotional disorders with onset usually occurring in childhood and adolescence: Secondary | ICD-10-CM

## 2020-03-31 DIAGNOSIS — F410 Panic disorder [episodic paroxysmal anxiety] without agoraphobia: Secondary | ICD-10-CM | POA: Diagnosis not present

## 2020-03-31 DIAGNOSIS — M542 Cervicalgia: Secondary | ICD-10-CM

## 2020-03-31 DIAGNOSIS — G4709 Other insomnia: Secondary | ICD-10-CM | POA: Diagnosis not present

## 2020-03-31 DIAGNOSIS — F909 Attention-deficit hyperactivity disorder, unspecified type: Secondary | ICD-10-CM | POA: Diagnosis not present

## 2020-03-31 MED ORDER — MELOXICAM 15 MG PO TABS: 15.0000 mg | ORAL_TABLET | Freq: Every day | ORAL | 0 refills | Status: DC

## 2020-03-31 MED FILL — buPROPion HCL ER (SR) 100 M: 100 | 30 days supply | Qty: 30 | Fill #0

## 2020-03-31 MED FILL — MELOXICAM 15 MG TABLET: 15 | 10 days supply | Qty: 10 | Fill #0

## 2020-03-31 NOTE — Assessment & Plan Note (Signed)
#   Neck pain-cervical spine stenosis history S: issues in the past but better in the last year.  Started in 2010. Did trial of prednisone in past but did not do well with that at all.   Pain rating, quality, Location-neck pain both sides of neck and radiates towards face and down to shoulder pain/tension/tightness. Also has had some pain down into lower back and down into her legs/feet. Also pins and needles down into the feet. Also some pins and needles into hands.  Started, duration- a week ago. Denies fall or trauma  Worse with (sitting-related to herniated disc compression)- worse with sitting and working at the computer. She is back in her office and having to carry more back and force (getting kitchen at home worked on but still in the office). Work office is different than home and wonders if that threw things off.  Relieved by- stretching, massages, and tylenol. arnicare gel rub- natural OTC.  Previous imaging-MR cervical spine April 06, 2012-cervical spondylotic changes most prominent at C4-C5 with mild to moderate spinal stenosis and minimal cord flattening.  At C6-C7-small broad based protrusion with mild spinal stenosis with minimal cord contact.  At C5-C6 bulge and mild spinal stenosis.  At C3-C4 mild bulge and mild spinal stenosis   ROS-No saddle anesthesia, bladder incontinence, fecal incontinence, weakness in extremity.  History negative for trauma, history of cancer, fever, chills, unintentional weight loss, recent bacterial infection, recent IV drug use, HIV, pain worse at night or while supine.   A/P: 59 year old female with history of cervical spinal stenosis that has been doing well recently until she had to start going into the office again or she is having to carry materials in and out.  Also sounds like may have history of lumbar spinal stenosis as well.  I have asked her to try to avoid lifting anything heavier than a milk jug.  She is interested in physical therapy and sports  medicine referral-referrals placed as below.  Does not tolerate prednisone so we will trial meloxicam.  Warned of GI bleeding risk also on SSRI. -She reports similar symptoms years ago but more severe at that time-she is okay with conservative approach From AVS "  Patient Instructions  We will call you within a week about your referral to physical therapy. If you do not hear within the week give [place] a call.   We will call you within two weeks about your referral to Dr. Tamala Julian of [facility]. If you do not hear within 3 weeks, give [place] a call. He also has x-ray in the building if he thinks that would be required before the MRI.   10 days of meloxicam. Take with food. Take your pepcid the days you take meloxicam.   We will call you within two weeks about your referral to Dr. Charlann Boxer. If you do not hear within 3 weeks, give [place] a call.   If you have new or worsening symptoms please let us know ASAP- particularly leg weakness, incontinence, or numbness/tingling in the groin    "

## 2020-03-31 NOTE — Assessment & Plan Note (Signed)
Patient with history of ADD.  Most recently treated by [facility] physicians that she shows me paperwork with diagnosis and prescription for Vyvanse 30 mg from after visit summary.  She feels like symptoms are worsening and would like to restart Vyvanse that she has been off for a period of time as she likes to minimize medicine -We discussed I am willing to prescribe this but I need a copy of original diagnosis from psychology or psychiatrist.  She was most recently diagnosed by focus MD locally-I do not see that location at present but I wonder if this could be [place] attention specialist now-request records -She would like to try a lower dose of Vyvanse 10 mg-following this and then once we get a copy of records -Also would need controlled substance contract and UDS at next visit

## 2020-03-31 NOTE — Progress Notes (Signed)
Phone [phone] In person visit   Subjective:   Tammy [name] is a 59 y.o. year old very pleasant female patient who presents for/with See problem oriented charting Chief Complaint  Patient presents with  . Follow-up   This visit occurred during the SARS-CoV-2 public health emergency.  Safety protocols were in place, including screening questions prior to the visit, additional usage of staff PPE, and extensive cleaning of exam room while observing appropriate contact time as indicated for disinfecting solutions.   Past Medical History-  Patient Active Problem List   Diagnosis Date Noted  . 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[place]) 02/15/2020    Priority: Medium  . GERD (gastroesophageal reflux disease) 02/15/2020    Priority: Medium  . Essential hypertension 02/18/2014    Priority: Medium  . Hyperlipidemia 09/18/2012    Priority: Medium  . Allergic rhinitis 03/09/2016    Priority: Low  . Chronic pain syndrome 02/22/2013    Priority: Low  . Chronic insomnia 02/22/2013    Priority: Low  . Paresthesias 10/25/2012    Priority: Low  . Cervical stenosis of spine 03/31/2020  . Attention deficit disorder (ADD) in adult 03/31/2020  . Low bone density 03/17/2020    Medications- reviewed and updated Current Outpatient Medications  Medication Sig Dispense Refill  . ACETAMINOPHEN-CAFFEINE PO Take by mouth.    Marland Kitchen FLUoxetine (PROZAC) 40 MG capsule Take 40 mg by mouth daily.    . hydrochlorothiazide (HYDRODIURIL) 12.5 MG tablet Take 1 tablet (12.5 mg total) by mouth daily. 90 tablet 3  . MAGNESIUM CHLORIDE-CALCIUM PO Take by mouth.    . Melatonin 1 MG/4ML LIQD Take by mouth.    . Probiotic Product (PROBIOTIC DAILY PO) Take by mouth.    . meloxicam (MOBIC) 15 MG tablet Take 1 tablet (15 mg total) by mouth daily. 10 tablet 0   No current facility-administered medications for this visit.     Objective:  BP 130/80   Pulse 81   Temp (!) 97.3 F (36.3 C)   Ht 5' 2.25"  (1.581 m)   Wt 146 lb (66.2 kg)   LMP 01/26/2011   SpO2 98%   BMI 26.49 kg/m  Gen: NAD, resting comfortably CV: RRR no murmurs rubs or gallops Lungs: CTAB no crackles, wheeze, rhonchi Ext: no edema Skin: warm, dry Neuro: Spurling test left and right produce numbness/tingling/pain into right side with Spurling to the right and left side with Spurling to the left.  Good strength in upper and lower extremities.  Intact sensation to gross touch    Assessment and Plan  # Neck pain-cervical spine stenosis history S: issues in the past but better in the last year.  Started in 2010. Did trial of prednisone in past but did not do well with that at all.   Pain rating, quality, Location-neck pain both sides of neck and radiates towards face and down to shoulder pain/tension/tightness. Also has had some pain down into lower back and down into her legs/feet. Also pins and needles down into the feet. Also some pins and needles into hands.  Started, duration- a week ago. Denies fall or trauma  Worse with (sitting-related to herniated disc compression)- worse with sitting and working at the computer. She is back in her office and having to carry more back and force (getting kitchen at home worked on but still in the office). Work office is different than home and wonders if that threw things off.  Relieved by- stretching, massages, and tylenol. arnicare  gel rub- natural OTC.  Previous imaging-MR cervical spine April 06, 2012-cervical spondylotic changes most prominent at C4-C5 with mild to moderate spinal stenosis and minimal cord flattening.  At C6-C7-small broad based protrusion with mild spinal stenosis with minimal cord contact.  At C5-C6 bulge and mild spinal stenosis.  At C3-C4 mild bulge and mild spinal stenosis   ROS-No saddle anesthesia, bladder incontinence, fecal incontinence, weakness in extremity.  History negative for trauma, history of cancer, fever, chills, unintentional weight loss, recent  bacterial infection, recent IV drug use, HIV, pain worse at night or while supine.   A/P: 59 year old female with history of cervical spinal stenosis that has been doing well recently until she had to start going into the office again or she is having to carry materials in and out.  Also sounds like may have history of lumbar spinal stenosis as well.  I have asked her to try to avoid lifting anything heavier than a milk jug.  She is interested in physical therapy and sports medicine referral-referrals placed as below.  Does not tolerate prednisone so we will trial meloxicam.  Warned of GI bleeding risk also on SSRI. -She reports similar symptoms years ago but more severe at that time-she is okay with conservative approach From AVS "  Patient Instructions  We will call you within a week about your referral to physical therapy. If you do not hear within the week give [place] a call.   We will call you within two weeks about your referral to Dr. Tamala Julian of [place] [facility]. If you do not hear within 3 weeks, give [place] a call. He also has x-ray in the building if he thinks that would be required before the MRI.   10 days of meloxicam. Take with food. Take your pepcid the days you take meloxicam.   We will call you within two weeks about your referral to Dr. Charlann Boxer. If you do not hear within 3 weeks, give [place] a call.   If you have new or worsening symptoms please let us know ASAP- particularly leg weakness, incontinence, or numbness/tingling in the groin    "  Cervical stenosis of spine # Neck pain-cervical spine stenosis history S: issues in the past but better in the last year.  Started in 2010. Did trial of prednisone in past but did not do well with that at all.   Pain rating, quality, Location-neck pain both sides of neck and radiates towards face and down to shoulder pain/tension/tightness. Also has had some pain down into lower back and down into her legs/feet. Also pins and needles down into  the feet. Also some pins and needles into hands.  Started, duration- a week ago. Denies fall or trauma  Worse with (sitting-related to herniated disc compression)- worse with sitting and working at the computer. She is back in her office and having to carry more back and force (getting kitchen at home worked on but still in the office). Work office is different than home and wonders if that threw things off.  Relieved by- stretching, massages, and tylenol. arnicare gel rub- natural OTC.  Previous imaging-MR cervical spine April 06, 2012-cervical spondylotic changes most prominent at C4-C5 with mild to moderate spinal stenosis and minimal cord flattening.  At C6-C7-small broad based protrusion with mild spinal stenosis with minimal cord contact.  At C5-C6 bulge and mild spinal stenosis.  At C3-C4 mild bulge and mild spinal stenosis   ROS-No saddle anesthesia, bladder incontinence, fecal incontinence, weakness  in extremity.  History negative for trauma, history of cancer, fever, chills, unintentional weight loss, recent bacterial infection, recent IV drug use, HIV, pain worse at night or while supine.   A/P: 59 year old female with history of cervical spinal stenosis that has been doing well recently until she had to start going into the office again or she is having to carry materials in and out.  Also sounds like may have history of lumbar spinal stenosis as well.  I have asked her to try to avoid lifting anything heavier than a milk jug.  She is interested in physical therapy and sports medicine referral-referrals placed as below.  Does not tolerate prednisone so we will trial meloxicam.  Warned of GI bleeding risk also on SSRI. -She reports similar symptoms years ago but more severe at that time-she is okay with conservative approach From AVS "  Patient Instructions  We will call you within a week about your referral to physical therapy. If you do not hear within the week give [place] a call.   We will call  you within two weeks about your referral to Dr. Tamala Julian of [place] [facility]. If you do not hear within 3 weeks, give [place] a call. He also has x-ray in the building if he thinks that would be required before the MRI.   10 days of meloxicam. Take with food. Take your pepcid the days you take meloxicam.   We will call you within two weeks about your referral to Dr. Charlann Boxer. If you do not hear within 3 weeks, give [place] a call.   If you have new or worsening symptoms please let us know ASAP- particularly leg weakness, incontinence, or numbness/tingling in the groin    "  Major depressive disorder in partial remission [facility]) Patient reports doing much better.  Appears controlled.  She has been able to wean off clonazepam for nocturnal panic which originally started when she was on Lexapro.  She asks if I will take over prescription Prozac-I am willing to do so but would like a letter from Dr. Daron Offer at time of transition-she will check in with him about this.  She would like to consolidate care to primary care  Attention deficit disorder (ADD) in adult Patient with history of ADD.  Most recently treated by [facility] physicians that she shows me paperwork with diagnosis and prescription for Vyvanse 30 mg from after visit summary.  She feels like symptoms are worsening and would like to restart Vyvanse that she has been off for a period of time as she likes to minimize medicine -We discussed I am willing to prescribe this but I need a copy of original diagnosis from psychology or psychiatrist.  She was most recently diagnosed by focus MD locally-I do not see that location at present but I wonder if this could be [place] attention specialist now-request records -She would like to try a lower dose of Vyvanse 10 mg-following this and then once we get a copy of records -Also would need controlled substance contract and UDS at next visit   Recommended follow up:  Return for as needed for new, worsening,  persistent symptoms. Future Appointments  Date Time Provider [place]  08/21/2020  4:20 PM [name], MD [facility]    Lab/Order associations:   ICD-10-CM   1. Neck pain  M54.2 Ambulatory referral to Physical Therapy    Ambulatory referral to Sports Medicine  2. Radicular pain of both lower extremities  M54.10 Ambulatory  referral to Physical Therapy    Ambulatory referral to Sports Medicine  3. Cervical stenosis of spine  M48.02 Ambulatory referral to Physical Therapy    Ambulatory referral to Sports Medicine  4. Major depressive disorder with single episode, in partial remission ([place])  F32.4   5. Attention deficit disorder (ADD) in adult  F98.8     Meds ordered this encounter  Medications  . meloxicam (MOBIC) 15 MG tablet    Sig: Take 1 tablet (15 mg total) by mouth daily.    Dispense:  10 tablet    Refill:  0    Return precautions advised.  Garret Reddish, MD

## 2020-03-31 NOTE — Assessment & Plan Note (Addendum)
Patient reports doing much better.  Appears controlled.  She has been able to wean off clonazepam for nocturnal panic which originally started when she was on Lexapro.  She asks if I will take over prescription Prozac-I am willing to do so but would like a letter from Dr. Daron Offer at time of transition-she will check in with him about this.  She would like to consolidate care to primary care

## 2020-03-31 NOTE — Patient Instructions (Addendum)
We will call you within a week about your referral to physical therapy. If you do not hear within the week give [place] a call.   We will call you within two weeks about your referral to Dr. Tamala Julian of [facility]. If you do not hear within 3 weeks, give [place] a call. He also has x-ray in the building if he thinks that would be required before the MRI.   10 days of meloxicam. Take with food. Take your pepcid the days you take meloxicam.   We will call you within two weeks about your referral to Dr. Charlann Boxer. If you do not hear within 3 weeks, give [place] a call.   If you have new or worsening symptoms please let us know ASAP- particularly leg weakness, incontinence, or numbness/tingling in the groin

## 2020-04-01 ENCOUNTER — Ambulatory Visit (INDEPENDENT_AMBULATORY_CARE_PROVIDER_SITE_OTHER): Payer: 59 | Admitting: Family Medicine

## 2020-04-01 ENCOUNTER — Ambulatory Visit (INDEPENDENT_AMBULATORY_CARE_PROVIDER_SITE_OTHER): Payer: 59

## 2020-04-01 ENCOUNTER — Encounter: Payer: Self-pay | Admitting: Family Medicine

## 2020-04-01 VITALS — BP 102/62 | HR 82 | Ht 62.0 in | Wt 148.0 lb

## 2020-04-01 DIAGNOSIS — G8929 Other chronic pain: Secondary | ICD-10-CM

## 2020-04-01 DIAGNOSIS — M999 Biomechanical lesion, unspecified: Secondary | ICD-10-CM

## 2020-04-01 DIAGNOSIS — M542 Cervicalgia: Secondary | ICD-10-CM | POA: Diagnosis not present

## 2020-04-01 DIAGNOSIS — M545 Low back pain: Secondary | ICD-10-CM | POA: Diagnosis not present

## 2020-04-01 DIAGNOSIS — M8580 Other specified disorders of bone density and structure, unspecified site: Secondary | ICD-10-CM | POA: Diagnosis not present

## 2020-04-01 DIAGNOSIS — M859 Disorder of bone density and structure, unspecified: Secondary | ICD-10-CM

## 2020-04-01 DIAGNOSIS — M4802 Spinal stenosis, cervical region: Secondary | ICD-10-CM | POA: Diagnosis not present

## 2020-04-01 MED ORDER — GABAPENTIN 100 MG PO CAPS: 200.0000 mg | ORAL_CAPSULE | Freq: Every day | ORAL | 3 refills | Status: DC

## 2020-04-01 MED FILL — GABAPENTIN 100 MG CAP: 100 | 90 days supply | Qty: 180 | Fill #0

## 2020-04-01 NOTE — Progress Notes (Signed)
[facility] [address] [place] Phone: [phone] Subjective:   I Kandace Blitz am serving as a Education administrator for Dr. Hulan Saas.  This visit occurred during the SARS-CoV-2 public health emergency.  Safety protocols were in place, including screening questions prior to the visit, additional usage of staff PPE, and extensive cleaning of exam room while observing appropriate contact time as indicated for disinfecting solutions.   I'm seeing this patient by the request  of:  [name], MD  CC: Neck and back pain  IRW:[id number]  Tammy [name] is a 59 y.o. female coming in with complaint of neck and back pain. States she has spinal stenosis. Nerve pain. States that by the end of the day her muscles start to guard. States that last week she was at her worse. Patient states that before the pandemic she was much more active. Numbness and tingling in hands and feet.   Onset- 1.5 weeks ago/ chronic  Location - Bilateral neck pain that radiates to her face and shoulders  Duration-  Character- tight  Aggravating factors- working remotely, sitting, carrying things into work  Reliving factors-  Therapies tried- stretching, sit-stand desk, topical, tylenol, laying on her back Severity- 8/10 at its worse      Past Medical History:  Diagnosis Date  . Arthritis   . Chronic fatigue syndrome   . Diverticulosis   . External hemorrhoids   . Hypertension   . Internal hemorrhoids   . Neuropathy    Past Surgical History:  Procedure Laterality Date  . CARDIAC CATHETERIZATION  09/18/2012  . LEFT HEART CATHETERIZATION WITH CORONARY ANGIOGRAM N/A 09/18/2012   Procedure: LEFT HEART CATHETERIZATION WITH CORONARY ANGIOGRAM;  Surgeon: Sherren Mocha, MD;  Location: [facility] CATH LAB;  Service: Cardiovascular;  Laterality: N/A;  . TONSILLECTOMY  1965   Social History   Socioeconomic History  . Marital status: Married    Spouse name: Not on file  .  Number of children: 2  . Years of education: Not on file  . Highest education level: Not on file  Occupational History  . Occupation: Diplomatic Services operational officer: [employer]  Tobacco Use  . Smoking status: Never Smoker  . Smokeless tobacco: Never Used  Substance and Sexual Activity  . Alcohol use: No  . Drug use: No  . Sexual activity: Yes  Other Topics Concern  . Not on file  Social History Narrative   Married. 2 adopted children- adopted daughter (21) when single after first marriage from [place]. Son from [place] with 2nd husband(18) in 2021. 1st husband local physician Linus Mako      LCSW with [employer]      Hobbies: animal rescue- has litter of kittens in 2021, walking, time outside   Social Determinants of Health   Financial Resource Strain:   . Difficulty of Paying Living Expenses:   Food Insecurity:   . Worried About Charity fundraiser in the Last Year:   . Arboriculturist in the Last Year:   Transportation Needs:   . Film/video editor (Medical):   Marland Kitchen Lack of Transportation (Non-Medical):   Physical Activity:   . Days of Exercise per Week:   . Minutes of Exercise per Session:   Stress:   . Feeling of Stress :   Social Connections:   . Frequency of Communication with Friends and Family:   . Frequency of Social Gatherings with Friends and Family:   . Attends  Religious Services:   . Active Member of Clubs or Organizations:   . Attends Archivist Meetings:   Marland Kitchen Marital Status:    Allergies  Allergen Reactions  . Eggs Or Egg-Derived Products Other (See Comments)    Makes pt feel like burping up rotten eggs   Family History  Problem Relation Age of Onset  . Heart attack Maternal Grandmother 50  . Arthritis Maternal Grandmother   . Hyperlipidemia Maternal Grandmother   . Heart disease Maternal Grandmother   . Diabetes Maternal Grandmother   . Pancreatic cancer Mother        doing well 2 years post diagnosis in 2021 at 47.   . Colon polyps  Father   . Healthy Sister   . Healthy Brother   . Heart attack Brother        age 67  . Colon cancer Neg Hx   . Esophageal cancer Neg Hx   . Kidney disease Neg Hx      Current Outpatient Medications (Cardiovascular):  .  hydrochlorothiazide (HYDRODIURIL) 12.5 MG tablet, Take 1 tablet (12.5 mg total) by mouth daily.   Current Outpatient Medications (Analgesics):  Marland Kitchen  ACETAMINOPHEN-CAFFEINE PO, Take by mouth. .  meloxicam (MOBIC) 15 MG tablet, Take 1 tablet (15 mg total) by mouth daily.   Current Outpatient Medications (Other):  Marland Kitchen  FLUoxetine (PROZAC) 40 MG capsule, Take 40 mg by mouth daily. Marland Kitchen  MAGNESIUM CHLORIDE-CALCIUM PO, Take by mouth. .  Melatonin 1 MG/4ML LIQD, Take by mouth. .  Probiotic Product (PROBIOTIC DAILY PO), Take by mouth. .  gabapentin (NEURONTIN) 100 MG capsule, Take 2 capsules (200 mg total) by mouth at bedtime.   Reviewed prior external information including notes and imaging from  primary care provider As well as notes that were available from care everywhere and other healthcare systems.  Past medical history, social, surgical and family history all reviewed in electronic medical record.  No pertanent information unless stated regarding to the chief complaint.   Review of Systems:  No headache, visual changes, nausea, vomiting, diarrhea, constipation, dizziness, abdominal pain, skin rash, fevers, chills, night sweats, weight loss, swollen lymph nodes, body aches, joint swelling, chest pain, shortness of breath, mood changes. POSITIVE muscle aches  Objective  Blood pressure 102/62, pulse 82, height 5\' 2"  (1.575 m), weight 148 lb (67.1 kg), last menstrual period 01/26/2011, SpO2 98 %.   General: No apparent distress alert and oriented x3 mood and affect normal, dressed appropriately.  HEENT: Pupils equal, extraocular movements intact  Respiratory: Patient's speak in full sentences and does not appear short of breath  Cardiovascular: No lower extremity  edema, non tender, no erythema  Neuro: Cranial nerves II through XII are intact, neurovascularly intact in all extremities with 2+ DTRs and 2+ pulses.  Gait normal with good balance and coordination.  MSK:  Non tender with full range of motion and good stability and symmetric strength and tone of shoulders, elbows, wrist, hip, knee and ank les bilaterally.  Neck exam shows the patient does have some loss of lordosis.  Some minimal decrease in sidebending of 5 to 10 degrees right not left.  Negative Spurling's noted.  Tightness in the parascapular region.  5 out of 5 strength of the upper extremities.  Low back exam does have some tenderness to palpation in the thoracolumbar juncture and diffusely.  No spinous process tenderness.  Mild tightness with straight leg test but no radicular symptoms.  Neurovascularly intact distally.  Osteopathic findings  C2  flexed rotated and side bent right C6 flexed rotated and side bent left T5 extended rotated and side bent left L4 flexed rotated and side bent right Sacrum right on right    Impression and Recommendations:     The above documentation has been reviewed and is accurate and complete [name], DO       Note: This dictation was prepared with Dragon dictation along with smaller phrase technology. Any transcriptional errors that result from this process are unintentional.

## 2020-04-01 NOTE — Patient Instructions (Addendum)
Good to see you Xrays today Gabapentin 200 mg Turmeric 500mg  daily  Tart cherry extract 1200mg  at night Vitamin D 2000 IU daily  PT will call you See me again in 4-6 weeks

## 2020-04-02 ENCOUNTER — Encounter: Payer: Self-pay | Admitting: Family Medicine

## 2020-04-03 ENCOUNTER — Encounter: Payer: Self-pay | Admitting: Family Medicine

## 2020-04-03 DIAGNOSIS — H52223 Regular astigmatism, bilateral: Secondary | ICD-10-CM | POA: Diagnosis not present

## 2020-04-03 DIAGNOSIS — H5213 Myopia, bilateral: Secondary | ICD-10-CM | POA: Diagnosis not present

## 2020-04-03 DIAGNOSIS — M999 Biomechanical lesion, unspecified: Secondary | ICD-10-CM | POA: Insufficient documentation

## 2020-04-03 MED FILL — VYVANSE 20 MG CAPSULE: 20 | 30 days supply | Qty: 30 | Fill #0

## 2020-04-03 NOTE — Assessment & Plan Note (Signed)
   Decision today to treat with OMT was based on Physical Exam  After verbal consent patient was treated with HVLA, ME, FPR techniques in cervical, thoracic,  lumbar and sacral areas, all areas are chronic   Patient tolerated the procedure well with improvement in symptoms  Patient given exercises, stretches and lifestyle modifications  See medications in patient instructions if given  Patient will follow up in 4-8 weeks 

## 2020-04-03 NOTE — Assessment & Plan Note (Signed)
History of cervical spinal stenosis.  Responded fairly well to osteopathic manipulation.  Meloxicam and gabapentin given.  Encouraged physical therapy that I think will be beneficial.  I believe patient will do very well with conservative therapy and follow-up in about 4 to 8 weeks

## 2020-04-03 NOTE — Assessment & Plan Note (Signed)
Discussed vitamin D supplementation. 

## 2020-04-06 DIAGNOSIS — F429 Obsessive-compulsive disorder, unspecified: Secondary | ICD-10-CM | POA: Diagnosis not present

## 2020-04-06 DIAGNOSIS — G4709 Other insomnia: Secondary | ICD-10-CM | POA: Diagnosis not present

## 2020-04-06 DIAGNOSIS — F909 Attention-deficit hyperactivity disorder, unspecified type: Secondary | ICD-10-CM | POA: Diagnosis not present

## 2020-04-06 DIAGNOSIS — F333 Major depressive disorder, recurrent, severe with psychotic symptoms: Secondary | ICD-10-CM | POA: Diagnosis not present

## 2020-04-07 MED FILL — FLUoxetine HCL 20 MG CAPS: 20 | 90 days supply | Qty: 90 | Fill #0

## 2020-04-10 ENCOUNTER — Other Ambulatory Visit: Payer: Self-pay

## 2020-04-10 ENCOUNTER — Ambulatory Visit: Payer: 59 | Attending: Family Medicine | Admitting: Physical Therapy

## 2020-04-10 ENCOUNTER — Encounter: Payer: Self-pay | Admitting: Physical Therapy

## 2020-04-10 DIAGNOSIS — M5442 Lumbago with sciatica, left side: Secondary | ICD-10-CM | POA: Diagnosis not present

## 2020-04-10 DIAGNOSIS — M5441 Lumbago with sciatica, right side: Secondary | ICD-10-CM | POA: Insufficient documentation

## 2020-04-10 DIAGNOSIS — G8929 Other chronic pain: Secondary | ICD-10-CM | POA: Diagnosis not present

## 2020-04-10 DIAGNOSIS — M62838 Other muscle spasm: Secondary | ICD-10-CM | POA: Diagnosis not present

## 2020-04-10 DIAGNOSIS — M542 Cervicalgia: Secondary | ICD-10-CM | POA: Diagnosis not present

## 2020-04-10 DIAGNOSIS — R293 Abnormal posture: Secondary | ICD-10-CM

## 2020-04-10 NOTE — Therapy (Signed)
[facility] [address]  [place], [place], [address] Phone: [phone]   Fax:  [phone]  Physical Therapy Evaluation  Patient Details  Name: Tammy [name] MRN: [id number] Date of Birth: 1961/09/19 Referring Provider (PT): Hulan Saas, DO   Encounter Date: 04/10/2020   PT End of Session - 04/10/20 0857    Visit Number 1    Number of Visits 7    Date for PT Re-Evaluation 05/22/20    Authorization Type [facility]    PT Start Time 0808    PT Stop Time 0847    PT Time Calculation (min) 39 min    Activity Tolerance Patient tolerated treatment well    Behavior During Therapy [facility] for tasks assessed/performed           Past Medical History:  Diagnosis Date  . Arthritis   . Chronic fatigue syndrome   . Diverticulosis   . External hemorrhoids   . Hypertension   . Internal hemorrhoids   . Neuropathy     Past Surgical History:  Procedure Laterality Date  . CARDIAC CATHETERIZATION  09/18/2012  . LEFT HEART CATHETERIZATION WITH CORONARY ANGIOGRAM N/A 09/18/2012   Procedure: LEFT HEART CATHETERIZATION WITH CORONARY ANGIOGRAM;  Surgeon: Sherren Mocha, MD;  Location: [facility] CATH LAB;  Service: Cardiovascular;  Laterality: N/A;  . TONSILLECTOMY  1965    There were no vitals filed for this visit.    Subjective Assessment - 04/10/20 0813    Subjective Patient reports that she had been working from home for the past 14 months d/t the pandemic. Recently returned to work d/t having work done on her house, and feels like lugging everything to the office has caused her to have a flare up in her neck and LB. This episode of pain has occurred for the past 3 weeks. Nerve pain has been "horrible" and occurs B face, neck, shoulders, hands also having B burning pain down the lateral hips. Has continued to walk throughout the pandemic but has not been swimming. 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 Pain gets worse throughout the day. Denies B&B control changes.    Pertinent History neuropathy, HTN, chronic fatigue syndrome, L cardiac cath with angio 2013    Limitations Sitting;Lifting;Standing;Writing;House hold activities    Diagnostic tests 04/01/20 cervical xray: No acute osseous abnormality, Mild degenerative disc disease of C5-6, Facet and uncovertebral arthropathy narrows the right neuralforamina at C3-4, C4-5, and C5-6; 04/01/20 lumbar xray: Mild-to-moderate lower lumbar spondylosis without evidence of interval progression from prior. No acute findings.    Patient Stated Goals get rid of pain    Currently in Pain? Yes    Pain Score 4     Pain Location Back    Pain Orientation Right;Left;Lower    Pain Descriptors / Indicators Burning    Pain Type Chronic pain    Multiple Pain Sites Yes    Pain Score 0    Pain Location Neck    Pain Orientation Right;Left;Posterior    Pain Descriptors / Indicators Tightness   stiffness   Pain Type Chronic pain    Pain Radiating Towards jaw              [facility] PT Assessment - 04/10/20 0819      Assessment   Medical Diagnosis Chronic low back pain, neck  pain    Referring Provider (PT) Hulan Saas, DO    Onset Date/Surgical Date 03/20/20    Hand Dominance Left    Next MD Visit 05/09/20    Prior Therapy yes- hip      Precautions   Precautions None      Balance Screen   Has the patient fallen in the past 6 months No    Has the patient had a decrease in activity level because of a fear of falling?  No    Is the patient reluctant to leave their home because of a fear of falling?  No      Home Environment   Living Environment Private residence    Living Arrangements Spouse/significant other    Available Help at Discharge Family    Type of [place] to enter    Entrance Stairs-Number of Steps [place] One level      Prior Function   Level of Independence Independent    Vocation Full time employment    Programme researcher, broadcasting/film/video for [employer] walking, water aerobics      Cognition   Overall Cognitive Status Within Functional Limits for tasks assessed      Sensation   Light Touch Appears Intact      Coordination   Gross Motor Movements are Fluid and Coordinated Yes      Posture/Postural Control   Posture/Postural Control Postural limitations    Postural Limitations Rounded Shoulders;Forward head      ROM / Strength   AROM / PROM / Strength AROM;Strength      AROM   AROM Assessment Site Cervical;Lumbar    Cervical Flexion 25   discomfort   Cervical Extension 40   discomfort   Cervical - Right Side Bend 38   discomfort   Cervical - Left Side Bend 15   discomfort   Cervical - Right Rotation 41    Cervical - Left Rotation 54    Lumbar Flexion toes    Lumbar Extension mildly limited    Lumbar - Right Side Bend mid thigh    Lumbar - Left Side Bend distal thigh    Lumbar - Right Rotation moderately limited    Lumbar - Left Rotation moderately limited      Strength   Strength Assessment Site Shoulder;Hip;Knee;Ankle    Right/Left Shoulder Right;Left    Right Shoulder Flexion 4+/5    Right Shoulder ABduction 4+/5    Right Shoulder Internal Rotation 4+/5    Right Shoulder External Rotation 4+/5    Left Shoulder Flexion 4+/5    Left Shoulder ABduction 4+/5    Left Shoulder Internal Rotation 4+/5    Right/Left Hip Right;Left    Right Hip Flexion 4+/5    Right Hip ABduction 4+/5    Right Hip ADduction 4+/5    Left Hip Flexion 4+/5    Left Hip ABduction 4+/5    Left Hip ADduction 4+/5    Right/Left Knee Right;Left    Right Knee Flexion 4+/5    Right Knee Extension 4+/5    Left Knee Flexion 4+/5    Left Knee Extension 4+/5    Right/Left Ankle Right;Left    Right Ankle Dorsiflexion 4+/5    Right Ankle Plantar  Flexion 4+/5    Left Ankle Dorsiflexion 4+/5    Left Ankle Plantar Flexion 4+/5      Palpation  Palpation comment diffuse tenderness and tightness throughout cervical musculature, shoulders, and pecs as well as B proximal glutes, piriformis, and QL, R>L; TTP over B PSIS and midline of L4/5      Ambulation/Gait   Assistive device None    Gait Pattern Step-through pattern;Lateral hip instability;Decreased stance time - left    Ambulation Surface Level;Indoor    Gait velocity WNL                      Objective measurements completed on examination: See above findings.               PT Education - 04/10/20 0857    Education Details prognosis, POC, HEP    Person(s) Educated Patient    Methods Explanation;Demonstration;Tactile cues;Verbal cues;Handout    Comprehension Verbalized understanding;Returned demonstration            PT Short Term Goals - 04/10/20 0909      PT SHORT TERM GOAL #1   Title Patient to be independent with initial HEP.    Time 3    Period Weeks    Status New    Target Date 05/01/20             PT Long Term Goals - 04/10/20 0909      PT LONG TERM GOAL #1   Title Patient to be independent with advanced HEP.    Time 6    Period Weeks    Status New    Target Date 05/22/20      PT LONG TERM GOAL #2   Title Patient to demonstrate cervical AROM WFL and without pain.    Time 6    Period Weeks    Status New    Target Date 05/22/20      PT LONG TERM GOAL #3   Title Patient to demonstrate lumbar AROM WFL and without pain.    Time 6    Period Weeks    Status New    Target Date 05/22/20      PT LONG TERM GOAL #4   Title Patient to report 50% decrease in tenderness with palpation over B cervical and posterior chain musculature.    Time 6    Period Weeks    Status New    Target Date 05/22/20      PT LONG TERM GOAL #5   Title Patient to report 75% improvement in cervical and LBP by the end of the day.    Time 6    Period  Weeks    Status New    Target Date 05/22/20                  Plan - 04/10/20 0858    Clinical Impression Statement Patient is a 59y/o F presenting to OPP with c/o acute on chronic cervical and LBP of 3 weeks duration since transitioning back to office from working from home for the past 14 months. Patient notes increased stress from having work done on her house and also feels that transitioning to working at her office desk has contributed to this recent flare. Described "nerve pain" that occurs over B neck, jaw, shoulders, and hands as well as into B hips. Denies changes in B&B control. Patient today presenting with rounded shoulders and forward head posture, limited cervical and lumbar AROM, good B shoulder and LE strength, diffuse tenderness and tightness throughout B cervical, shoulder, and chest musculature as well as B proximal glutes, piriformis, and QL,  R>L, and gait deviations. D/t increased muscle tone, stress, and worsening throughout the day, pain seems more muscular in nature. Patient was educated on gentle stretching and postural correction HEP- patient reported understanding. Would benefit from skilled PT services 1x/week for 6 weeks to address aforementioned impairments.    Personal Factors and Comorbidities Age;Sex;Comorbidity 3+;Fitness;Past/Current Experience;Profession;Time since onset of injury/illness/exacerbation    Comorbidities neuropathy, HTN, chronic fatigue syndrome, L cardiac cath with angio 2013    Examination-Activity Limitations Sit;Bend;Squat;Stairs;Carry;Stand;Dressing;Transfers;Locomotion Level;Reach Overhead;Lift    Examination-Participation Restrictions Church;Cleaning;Shop;Community Activity;Driving;Yard Work;Interpersonal Relationship;Laundry;Meal Prep    Stability/Clinical Decision Making Stable/Uncomplicated    Clinical Decision Making Low    Rehab Potential Good    PT Frequency 1x / week    PT Duration 6 weeks    PT Treatment/Interventions ADLs/Self  Care Home Management;Cryotherapy;Electrical Stimulation;Moist Heat;Traction;Therapeutic exercise;Therapeutic activities;Functional mobility training;Stair training;Gait training;Ultrasound;Neuromuscular re-education;Patient/family education;Manual techniques;Taping;Energy conservation;Dry needling;Passive range of motion    PT Next Visit Plan reassess HEP; progress cervical and lumbopelvic ROM, STM    Consulted and Agree with Plan of Care Patient           Patient will benefit from skilled therapeutic intervention in order to improve the following deficits and impairments:  Hypomobility, Decreased activity tolerance, Pain, Impaired UE functional use, Increased fascial restricitons, Decreased strength, Increased muscle spasms, Difficulty walking, Decreased range of motion, Improper body mechanics, Postural dysfunction, Impaired flexibility  Visit Diagnosis: Cervicalgia  Chronic bilateral low back pain with bilateral sciatica  Other muscle spasm  Abnormal posture     Problem List Patient Active Problem List   Diagnosis Date Noted  . Nonallopathic lesion of cervical region 04/03/2020  . Nonallopathic lesion of thoracic region 04/03/2020  . Nonallopathic lesion of lumbar region 04/03/2020  . Cervical stenosis of spine 03/31/2020  . Attention deficit disorder (ADD) in adult 03/31/2020  . Low bone density 03/17/2020  . 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 GERD (gastroesophageal reflux disease) 02/15/2020  . Allergic rhinitis 03/09/2016  . Essential hypertension 02/18/2014  . Chronic pain syndrome 02/22/2013  . Chronic insomnia 02/22/2013  . Paresthesias 10/25/2012  . Hyperlipidemia 09/18/2012    Janene Harvey, PT, DPT 04/10/20 9:13 AM   [facility] [place], [place], [address] Phone: [phone]   Fax:  [phone]  Name: Tammy [name] MRN: [id number] Date of  Birth: Aug 20, 1961

## 2020-04-12 DIAGNOSIS — G4709 Other insomnia: Secondary | ICD-10-CM | POA: Diagnosis not present

## 2020-04-12 DIAGNOSIS — F333 Major depressive disorder, recurrent, severe with psychotic symptoms: Secondary | ICD-10-CM | POA: Diagnosis not present

## 2020-04-12 DIAGNOSIS — F429 Obsessive-compulsive disorder, unspecified: Secondary | ICD-10-CM | POA: Diagnosis not present

## 2020-04-12 DIAGNOSIS — F909 Attention-deficit hyperactivity disorder, unspecified type: Secondary | ICD-10-CM | POA: Diagnosis not present

## 2020-04-16 DIAGNOSIS — F410 Panic disorder [episodic paroxysmal anxiety] without agoraphobia: Secondary | ICD-10-CM | POA: Diagnosis not present

## 2020-04-16 DIAGNOSIS — F411 Generalized anxiety disorder: Secondary | ICD-10-CM | POA: Diagnosis not present

## 2020-04-17 ENCOUNTER — Ambulatory Visit: Payer: 59

## 2020-04-24 ENCOUNTER — Other Ambulatory Visit: Payer: Self-pay

## 2020-04-24 ENCOUNTER — Encounter: Payer: Self-pay | Admitting: Physical Therapy

## 2020-04-24 ENCOUNTER — Ambulatory Visit: Payer: 59 | Attending: Family Medicine | Admitting: Physical Therapy

## 2020-04-24 DIAGNOSIS — R293 Abnormal posture: Secondary | ICD-10-CM | POA: Diagnosis not present

## 2020-04-24 DIAGNOSIS — G8929 Other chronic pain: Secondary | ICD-10-CM | POA: Insufficient documentation

## 2020-04-24 DIAGNOSIS — M5442 Lumbago with sciatica, left side: Secondary | ICD-10-CM | POA: Insufficient documentation

## 2020-04-24 DIAGNOSIS — M62838 Other muscle spasm: Secondary | ICD-10-CM | POA: Insufficient documentation

## 2020-04-24 DIAGNOSIS — M542 Cervicalgia: Secondary | ICD-10-CM | POA: Diagnosis not present

## 2020-04-24 DIAGNOSIS — M5441 Lumbago with sciatica, right side: Secondary | ICD-10-CM | POA: Insufficient documentation

## 2020-04-24 NOTE — Therapy (Signed)
[facility] [address]  [place], [place], [address] Phone: [phone]   Fax:  [phone]  Physical Therapy Treatment  Patient Details  Name: Tammy [name] MRN: [id number] Date of Birth: Aug 13, 1961 Referring Provider (PT): Hulan Saas, DO   Encounter Date: 04/24/2020   PT End of Session - 04/24/20 1406    Visit Number 2    Number of Visits 7    Date for PT Re-Evaluation 05/22/20    Authorization Type [facility]    PT Start Time 1319    PT Stop Time 1402    PT Time Calculation (min) 43 min    Activity Tolerance Patient tolerated treatment well    Behavior During Therapy [facility] for tasks assessed/performed           Past Medical History:  Diagnosis Date  . Arthritis   . Chronic fatigue syndrome   . Diverticulosis   . External hemorrhoids   . Hypertension   . Internal hemorrhoids   . Neuropathy     Past Surgical History:  Procedure Laterality Date  . CARDIAC CATHETERIZATION  09/18/2012  . LEFT HEART CATHETERIZATION WITH CORONARY ANGIOGRAM N/A 09/18/2012   Procedure: LEFT HEART CATHETERIZATION WITH CORONARY ANGIOGRAM;  Surgeon: Sherren Mocha, MD;  Location: [facility] CATH LAB;  Service: Cardiovascular;  Laterality: N/A;  . TONSILLECTOMY  1965    There were no vitals filed for this visit.   Subjective Assessment - 04/24/20 1320    Subjective Had been doing well until today. Started to have increased pain shooting up the L side of the neck, head, and jaw with a HA. Carried a lot of stuff in today. No chest pain or pressure.    Pertinent History neuropathy, HTN, chronic fatigue syndrome, L cardiac cath with angio 2013    Diagnostic tests 04/01/20 cervical xray: No acute osseous abnormality, Mild degenerative disc disease of C5-6, Facet and uncovertebral arthropathy narrows the right neuralforamina at C3-4, C4-5, and C5-6; 04/01/20 lumbar xray: Mild-to-moderate lower lumbar spondylosis without evidence of interval  progression from prior. No acute findings.    Patient Stated Goals get rid of pain    Currently in Pain? Yes    Pain Score 7    Pain Location Head    Pain Orientation Left    Pain Descriptors / Indicators Aching    Pain Type Chronic pain    Pain Radiating Towards into jaw and neck                             [facility] Adult PT Treatment/Exercise - 04/24/20 0001      Exercises   Exercises Neck;Lumbar      Neck Exercises: Machines for Strengthening   Nustep L4 x 6 min (UEs/LEs)      Lumbar Exercises: Sidelying   Other Sidelying Lumbar Exercises open book stretch to R 5x5"      Manual Therapy   Manual Therapy Soft tissue mobilization;Myofascial release;Manual Traction;Passive ROM    Manual therapy comments prone/supine    Soft tissue mobilization STM to B UT, LS, cervical suboccipitals, cervical paraspinals, temporalis   increased tightness L>R   Myofascial Release manual TPR to B UT, LS, L masseter, B suboccipital release    Passive ROM L suboccipital stretch 3x20" to tolerance    Manual Traction with towel assist 3x20"      Neck Exercises: Stretches   Other Neck Stretches L suboccipital stretch  with self-OP 30"                   PT Education - 04/24/20 1405    Education Details update to HEP, edu on use of tennis ball suboccipital release and proper resting jaw position, self-release of B masseter and temporalis for relief of jaw tension at home    Person(s) Educated Patient    Methods Explanation;Demonstration;Tactile cues;Verbal cues;Handout    Comprehension Verbalized understanding;Returned demonstration            PT Short Term Goals - 04/24/20 1411      PT SHORT TERM GOAL #1   Title Patient to be independent with initial HEP.    Time 3    Period Weeks    Status On-going    Target Date 05/01/20             PT Long Term Goals - 04/24/20 1411      PT LONG TERM GOAL #1   Title Patient to be independent with advanced HEP.    Time 6     Period Weeks    Status On-going      PT LONG TERM GOAL #2   Title Patient to demonstrate cervical AROM WFL and without pain.    Time 6    Period Weeks    Status On-going      PT LONG TERM GOAL #3   Title Patient to demonstrate lumbar AROM WFL and without pain.    Time 6    Period Weeks    Status On-going      PT LONG TERM GOAL #4   Title Patient to report 50% decrease in tenderness with palpation over B cervical and posterior chain musculature.    Time 6    Period Weeks    Status On-going      PT LONG TERM GOAL #5   Title Patient to report 75% improvement in cervical and LBP by the end of the day.    Time 6    Period Weeks    Status On-going                 Plan - 04/24/20 1407    Clinical Impression Statement Patient reporting that she had been doing well until today when she started to notice shooting pain from her neck, jaw, and into the back of the head. Denies chest pain or pressure. Focused session on pain and tension relief with manual therapy. Patient tolerated STM, TPR and suboccipital release, manual traction, and passive stretching. Demonstrated increased soft tissue restriction L >R, with most soft tissue restriction in L suboccipitals, UT, and LS. Educated patient on use of tennis ball suboccipital release and proper resting jaw position, self-release of B masseter and temporalis for relief of jaw tension at home. Reviewed open book stretch for proper form. Patient reported understanding of edu provided today and without complaints at end of session.    Comorbidities neuropathy, HTN, chronic fatigue syndrome, L cardiac cath with angio 2013    PT Treatment/Interventions ADLs/Self Care Home Management;Cryotherapy;Electrical Stimulation;Moist Heat;Traction;Therapeutic exercise;Therapeutic activities;Functional mobility training;Stair training;Gait training;Ultrasound;Neuromuscular re-education;Patient/family education;Manual techniques;Taping;Energy  conservation;Dry needling;Passive range of motion    PT Next Visit Plan reassess HEP; progress cervical and lumbopelvic ROM, STM    Consulted and Agree with Plan of Care Patient           Patient will benefit from skilled therapeutic intervention in order to improve the following deficits and impairments:  Hypomobility, Decreased activity tolerance,  Pain, Impaired UE functional use, Increased fascial restricitons, Decreased strength, Increased muscle spasms, Difficulty walking, Decreased range of motion, Improper body mechanics, Postural dysfunction, Impaired flexibility  Visit Diagnosis: Cervicalgia  Chronic bilateral low back pain with bilateral sciatica  Other muscle spasm  Abnormal posture     Problem List Patient Active Problem List   Diagnosis Date Noted  . Nonallopathic lesion of cervical region 04/03/2020  . Nonallopathic lesion of thoracic region 04/03/2020  . Nonallopathic lesion of lumbar region 04/03/2020  . Cervical stenosis of spine 03/31/2020  . Attention deficit disorder (ADD) in adult 03/31/2020  . Low bone density 03/17/2020  . 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[place]) 02/15/2020  . GERD (gastroesophageal reflux disease) 02/15/2020  . Allergic rhinitis 03/09/2016  . Essential hypertension 02/18/2014  . Chronic pain syndrome 02/22/2013  . Chronic insomnia 02/22/2013  . Paresthesias 10/25/2012  . Hyperlipidemia 09/18/2012     Janene Harvey, PT, DPT 04/24/20 2:13 PM   [facility] [address]  [place], [place], [address] Phone: [phone]   Fax:  [phone]  Name: BETTYJANE SHENOY MRN: [id number] Date of Birth: August 07, 1961

## 2020-04-27 DIAGNOSIS — G4709 Other insomnia: Secondary | ICD-10-CM | POA: Diagnosis not present

## 2020-04-27 DIAGNOSIS — F909 Attention-deficit hyperactivity disorder, unspecified type: Secondary | ICD-10-CM | POA: Diagnosis not present

## 2020-04-27 DIAGNOSIS — F333 Major depressive disorder, recurrent, severe with psychotic symptoms: Secondary | ICD-10-CM | POA: Diagnosis not present

## 2020-04-27 DIAGNOSIS — F429 Obsessive-compulsive disorder, unspecified: Secondary | ICD-10-CM | POA: Diagnosis not present

## 2020-05-02 ENCOUNTER — Encounter: Payer: Self-pay | Admitting: Family Medicine

## 2020-05-03 ENCOUNTER — Encounter: Payer: Self-pay | Admitting: Family Medicine

## 2020-05-03 ENCOUNTER — Other Ambulatory Visit: Payer: Self-pay

## 2020-05-03 ENCOUNTER — Telehealth (INDEPENDENT_AMBULATORY_CARE_PROVIDER_SITE_OTHER): Payer: 59 | Admitting: Family Medicine

## 2020-05-03 VITALS — Temp 97.1°F | Ht 62.0 in | Wt 148.0 lb

## 2020-05-03 DIAGNOSIS — R21 Rash and other nonspecific skin eruption: Secondary | ICD-10-CM

## 2020-05-03 MED ORDER — KETOCONAZOLE 2 % EX CREA: 1.0000 "application " | TOPICAL_CREAM | Freq: Two times a day (BID) | CUTANEOUS | 0 refills | Status: DC

## 2020-05-03 MED ORDER — TRIAMCINOLONE ACETONIDE 0.5 % EX OINT: 1.0000 "application " | TOPICAL_OINTMENT | Freq: Two times a day (BID) | CUTANEOUS | 0 refills | Status: DC

## 2020-05-03 MED FILL — KETOCONAZOLE 2% CREAM: 2 | 30 days supply | Qty: 60 | Fill #0

## 2020-05-03 MED FILL — TRIAMCINOLONE 0.5% OINTMENT: 0.5 | 15 days supply | Qty: 30 | Fill #0

## 2020-05-03 NOTE — Progress Notes (Signed)
   Tammy [name] is a 59 y.o. female who presents today for a virtual office visit.  Assessment/Plan:  Rash See below picture.  Appearance consistent with tinea corporis.  Eczema or psoriasis also possible though much less likely given she has no history of this.  Appearance not consistent with drug rash.  Will start topical ketoconazole.  Also send in prescription for triamcinolone with instruction to not start unless symptoms do not improve after several days of ketoconazole.  Discussed reasons to return to care.    Subjective:  HPI:  Patient with rash for the last 10 days or so. Started on the right side of her.  Has progressively worsened since then.  Minimal itching.  No pain.  No new detergents, soaps, lotions, or other obvious exposures.  She was started on Abilify about a month ago by her psychiatrist.         Objective/Observations  Physical Exam: Gen: NAD, resting comfortably Pulm: Normal work of breathing Neuro: Grossly normal, moves all extremities Psych: Normal affect and thought content      Virtual Visit via Video   I connected with ANELIS [name] on 05/03/20 at  9:00 AM EDT by a video enabled telemedicine application and verified that I am speaking with the correct person using two identifiers. The limitations of evaluation and management by telemedicine and the availability of in person appointments were discussed. The patient expressed understanding and agreed to proceed.   Patient location: Home Provider location: [place] participating in the virtual visit: Myself and Patient     Algis Greenhouse. Jerline Pain, MD 05/03/2020 9:18 AM

## 2020-05-08 ENCOUNTER — Other Ambulatory Visit: Payer: Self-pay

## 2020-05-08 ENCOUNTER — Encounter: Payer: Self-pay | Admitting: Physical Therapy

## 2020-05-08 ENCOUNTER — Ambulatory Visit: Payer: 59 | Admitting: Physical Therapy

## 2020-05-08 DIAGNOSIS — G8929 Other chronic pain: Secondary | ICD-10-CM | POA: Diagnosis not present

## 2020-05-08 DIAGNOSIS — M5441 Lumbago with sciatica, right side: Secondary | ICD-10-CM

## 2020-05-08 DIAGNOSIS — M542 Cervicalgia: Secondary | ICD-10-CM | POA: Diagnosis not present

## 2020-05-08 DIAGNOSIS — M62838 Other muscle spasm: Secondary | ICD-10-CM

## 2020-05-08 DIAGNOSIS — R293 Abnormal posture: Secondary | ICD-10-CM

## 2020-05-08 DIAGNOSIS — M5442 Lumbago with sciatica, left side: Secondary | ICD-10-CM | POA: Diagnosis not present

## 2020-05-08 NOTE — Therapy (Signed)
[facility] [address]  [place], [place], [address] Phone: [phone]   Fax:  [phone]  Physical Therapy Treatment  Patient Details  Name: Tammy [name] MRN: [id number] Date of Birth: 02/11/1961 Referring Provider (PT): Hulan Saas, DO   Encounter Date: 05/08/2020   PT End of Session - 05/08/20 1410    Visit Number 3    Number of Visits 7    Date for PT Re-Evaluation 05/22/20    Authorization Type [facility]    PT Start Time 0947    PT Stop Time 1403    PT Time Calculation (min) 46 min    Activity Tolerance Patient tolerated treatment well    Behavior During Therapy [facility] for tasks assessed/performed           Past Medical History:  Diagnosis Date  . Arthritis   . Chronic fatigue syndrome   . Diverticulosis   . External hemorrhoids   . Hypertension   . Internal hemorrhoids   . Neuropathy     Past Surgical History:  Procedure Laterality Date  . CARDIAC CATHETERIZATION  09/18/2012  . LEFT HEART CATHETERIZATION WITH CORONARY ANGIOGRAM N/A 09/18/2012   Procedure: LEFT HEART CATHETERIZATION WITH CORONARY ANGIOGRAM;  Surgeon: Sherren Mocha, MD;  Location: [facility] CATH LAB;  Service: Cardiovascular;  Laterality: N/A;  . TONSILLECTOMY  1965    There were no vitals filed for this visit.   Subjective Assessment - 05/08/20 1319    Subjective Got good benefit from massage last session. Intensity of "nerve pain" has improved.    Pertinent History neuropathy, HTN, chronic fatigue syndrome, L cardiac cath with angio 2013    Diagnostic tests 04/01/20 cervical xray: No acute osseous abnormality, Mild degenerative disc disease of C5-6, Facet and uncovertebral arthropathy narrows the right neuralforamina at C3-4, C4-5, and C5-6; 04/01/20 lumbar xray: Mild-to-moderate lower lumbar spondylosis without evidence of interval progression from prior. No acute findings.    Patient Stated Goals get rid of pain    Currently in  Pain? Yes    Pain Score 3     Pain Location Back    Pain Orientation Right;Left;Lower    Pain Descriptors / Indicators Burning    Pain Score 5    Pain Location Neck    Pain Orientation Right;Left    Pain Descriptors / Indicators Aching    Pain Type Chronic pain                             [facility] Adult PT Treatment/Exercise - 05/08/20 0001      Neck Exercises: Seated   Cervical Rotation Right;Left;5 reps    Cervical Rotation Limitations SNAG to tolerance    Other Seated Exercise cervical extension SNAG x10 to tolerance      Lumbar Exercises: Aerobic   Nustep L4 x 6 min (UEs/LEs)      Lumbar Exercises: Standing   Wall Slides 10 reps    Wall Slides Limitations 1st set with red TB, 2nd set with ball    Shoulder Adduction Limitations sidestepping with red loop around ankles 4x 30ft   good form   Other Standing Lumbar Exercises thoracic and lumbar extension at wall 10x each to tolerance    Other Standing Lumbar Exercises R/L open book stretch at wall 10x each side   limited ROM     Manual Therapy   Manual Therapy Soft tissue mobilization;Myofascial release;Manual Traction  Manual therapy comments supine    Soft tissue mobilization STM to B cervical paraspinals, suboccipitals, scalenes   very tight and TTP over R scalenes   Myofascial Release B suboccipital release, manual TPR to R scalenes    Manual Traction with towel assist 3x20" and edu on pt's husband providing patient traction with towel                  PT Education - 05/08/20 1410    Education Details update to HEP, edu on manual traction using towel    Person(s) Educated Patient    Methods Explanation;Demonstration;Tactile cues;Verbal cues;Handout    Comprehension Returned demonstration;Verbalized understanding            PT Short Term Goals - 04/24/20 1411      PT SHORT TERM GOAL #1   Title Patient to be independent with initial HEP.    Time 3    Period Weeks    Status On-going     Target Date 05/01/20             PT Long Term Goals - 04/24/20 1411      PT LONG TERM GOAL #1   Title Patient to be independent with advanced HEP.    Time 6    Period Weeks    Status On-going      PT LONG TERM GOAL #2   Title Patient to demonstrate cervical AROM WFL and without pain.    Time 6    Period Weeks    Status On-going      PT LONG TERM GOAL #3   Title Patient to demonstrate lumbar AROM WFL and without pain.    Time 6    Period Weeks    Status On-going      PT LONG TERM GOAL #4   Title Patient to report 50% decrease in tenderness with palpation over B cervical and posterior chain musculature.    Time 6    Period Weeks    Status On-going      PT LONG TERM GOAL #5   Title Patient to report 75% improvement in cervical and LBP by the end of the day.    Time 6    Period Weeks    Status On-going                 Plan - 05/08/20 1411    Clinical Impression Statement Patient reporting improvement in pain levels since recent vacation. Worked on gentle thoracolumbar ROM ther-ex at wall, with patient demonstrating limited ROM in rotation and extension. Demonstrated good form with sidestepping and banded wall squats for hip strengthening. Initiated cervical SNAGs to tolerance with patient reporting good benefit. Educated patient on and provided patient with manual cervical traction using towel assist. Patient tolerated STM, TPR, and suboccipital release with most tenderness in R scalenes. Educated patient on uses of belly-breathing to avoid over-use of scalenes. Patient reported understanding of all edu provided today and without complaints at end of session.    Comorbidities neuropathy, HTN, chronic fatigue syndrome, L cardiac cath with angio 2013    PT Treatment/Interventions ADLs/Self Care Home Management;Cryotherapy;Electrical Stimulation;Moist Heat;Traction;Therapeutic exercise;Therapeutic activities;Functional mobility training;Stair training;Gait  training;Ultrasound;Neuromuscular re-education;Patient/family education;Manual techniques;Taping;Energy conservation;Dry needling;Passive range of motion    PT Next Visit Plan progress cervical and lumbopelvic ROM, STM    Consulted and Agree with Plan of Care Patient           Patient will benefit from skilled therapeutic intervention in order to improve  the following deficits and impairments:  Hypomobility, Decreased activity tolerance, Pain, Impaired UE functional use, Increased fascial restricitons, Decreased strength, Increased muscle spasms, Difficulty walking, Decreased range of motion, Improper body mechanics, Postural dysfunction, Impaired flexibility  Visit Diagnosis: Cervicalgia  Chronic bilateral low back pain with bilateral sciatica  Other muscle spasm  Abnormal posture     Problem List Patient Active Problem List   Diagnosis Date Noted  . Nonallopathic lesion of cervical region 04/03/2020  . Nonallopathic lesion of thoracic region 04/03/2020  . Nonallopathic lesion of lumbar region 04/03/2020  . Cervical stenosis of spine 03/31/2020  . Attention deficit disorder (ADD) in adult 03/31/2020  . Low bone density 03/17/2020  . 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[place]) 02/15/2020  . GERD (gastroesophageal reflux disease) 02/15/2020  . Allergic rhinitis 03/09/2016  . Essential hypertension 02/18/2014  . Chronic pain syndrome 02/22/2013  . Chronic insomnia 02/22/2013  . Paresthesias 10/25/2012  . Hyperlipidemia 09/18/2012     Janene Harvey, PT, DPT 05/08/20 2:30 PM   [facility] [address]  [place], [place], [address] Phone: [phone]   Fax:  [phone]  Name: Tammy [name] MRN: [id number] Date of Birth: 30-May-1961

## 2020-05-09 ENCOUNTER — Ambulatory Visit: Payer: 59 | Admitting: Family Medicine

## 2020-05-09 DIAGNOSIS — F339 Major depressive disorder, recurrent, unspecified: Secondary | ICD-10-CM | POA: Diagnosis not present

## 2020-05-15 ENCOUNTER — Ambulatory Visit: Payer: 59

## 2020-05-15 ENCOUNTER — Other Ambulatory Visit: Payer: Self-pay

## 2020-05-15 DIAGNOSIS — M5442 Lumbago with sciatica, left side: Secondary | ICD-10-CM

## 2020-05-15 DIAGNOSIS — M62838 Other muscle spasm: Secondary | ICD-10-CM

## 2020-05-15 DIAGNOSIS — G8929 Other chronic pain: Secondary | ICD-10-CM

## 2020-05-15 DIAGNOSIS — R293 Abnormal posture: Secondary | ICD-10-CM | POA: Diagnosis not present

## 2020-05-15 DIAGNOSIS — M542 Cervicalgia: Secondary | ICD-10-CM

## 2020-05-15 DIAGNOSIS — M5441 Lumbago with sciatica, right side: Secondary | ICD-10-CM | POA: Diagnosis not present

## 2020-05-15 MED FILL — HYDROCHLOROTHIAZIDE 12.5 MG: 12.5 | 90 days supply | Qty: 90 | Fill #1

## 2020-05-15 NOTE — Therapy (Signed)
[facility] [address]  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 Arthritis   . Chronic fatigue syndrome   . Diverticulosis   . External hemorrhoids   . Hypertension   . Internal hemorrhoids   . Neuropathy     Past Surgical History:  Procedure Laterality Date  . CARDIAC CATHETERIZATION  09/18/2012  . LEFT HEART CATHETERIZATION WITH CORONARY ANGIOGRAM N/A 09/18/2012   Procedure: LEFT HEART CATHETERIZATION WITH CORONARY ANGIOGRAM;  Surgeon: Sherren Mocha, MD;  Location[facility] CATH LAB;  Service: Cardiovascular;  Laterality: N/A;  . TONSILLECTOMY  1965    There were no vitals filed for this visit.   Subjective Assessment - 05/15/20 1320    Subjective Pt. requesting manual therapy to her neck for pain relief today as this is her primary concern.    Pertinent History neuropathy, HTN, chronic fatigue syndrome, L cardiac cath with angio 2013    Diagnostic tests 04/01/20 cervical xray: No acute osseous abnormality, Mild degenerative disc disease of C5-6, Facet and uncovertebral arthropathy narrows the right neuralforamina at C3-4, C4-5, and C5-6; 04/01/20 lumbar xray: Mild-to-moderate lower lumbar spondylosis without evidence of interval progression from  prior. No acute findings.    Currently in Pain? Yes    Pain Score 2     Pain Location Back    Pain Orientation Right;Left;Lower    Pain Descriptors / Indicators Burning;Sore    Pain Type Chronic pain    Multiple Pain Sites Yes    Pain Score 5    Pain Location Neck    Pain Orientation Right;Left    Pain Descriptors / Indicators Aching;Tightness    Pain Type Chronic pain                             [facility] Adult PT Treatment/Exercise - 05/15/20 0001      Neck Exercises: Machines for Strengthening   UBE (Upper Arm Bike) --    Cybex Row 5# x 15 reps - low handles       Neck Exercises: Theraband   Shoulder Extension 10 reps    Shoulder Extension Limitations yellow TB    Rows 15 reps    Rows Limitations yellow TB      Lumbar Exercises: Aerobic   UBE (Upper Arm Bike) Lvl 1.0, 3 min forwards, 3 min backwards       Manual Therapy   Manual Therapy Soft tissue mobilization;Passive ROM    Manual therapy comments supine    Soft tissue mobilization STM to B cervical paraspinals, UT, LS    Myofascial Release  TPR to R UT    Passive ROM Gentle B side bending stretch in varying angles for scalenes and UT stretch with therapist       Neck Exercises: Stretches   Upper Trapezius Stretch Right;Left;1 rep;30 seconds    Upper Trapezius Stretch Limitations hands anchored on chair     Neck Stretch 1 rep;30 seconds    Neck Stretch Limitations varying angles of scalenes stretch    Corner Stretch 2 reps;30 seconds    Corner Stretch Limitations single arm mid pec stretch on doorway                     PT Short Term Goals - 05/15/20 1323      PT SHORT TERM GOAL #1   Title Patient to be independent with initial HEP.    Time 3    Period Weeks    Status Achieved    Target Date 05/01/20             PT Long Term Goals - 04/24/20 1411      PT LONG TERM GOAL #1   Title Patient to be independent with advanced HEP.    Time 6    Period Weeks    Status On-going       PT LONG TERM GOAL #2   Title Patient to demonstrate cervical AROM WFL and without pain.    Time 6    Period Weeks    Status On-going      PT LONG TERM GOAL #3   Title Patient to demonstrate lumbar AROM WFL and without pain.    Time 6    Period Weeks    Status On-going      PT LONG TERM GOAL #4   Title Patient to report 50% decrease in tenderness with palpation over B cervical and posterior chain musculature.    Time 6    Period Weeks    Status On-going      PT LONG TERM GOAL #5   Title Patient to report 75% improvement in cervical and LBP by the end of the day.    Time 6    Period Weeks    Status On-going                 Plan - 05/15/20 1324    Clinical Impression Statement Pt. denies questions regarding HEP thus STG #1 met.  Notes her neck pain is her primary complaint today.  MT addressed increased tension in B UT, LS, cervical paraspinals with some relief noted.  Postural strengthening activities focused on periscapular strengthening with tactile cueing for UT inhibition and avoidance of scapular "hiking" with retraction activities.  Oda tolerated anterior chest stretching at door frame well today.  notes ongoing improvement of distal symptoms with some centralization noted.  Ended visit with modalities deferred as pt. having to leave for work meeting.    Comorbidities neuropathy, HTN, chronic fatigue syndrome, L cardiac cath with angio 2013    Rehab Potential Good    PT Duration 6 weeks    PT Treatment/Interventions ADLs/Self Care Home Management;Cryotherapy;Electrical Stimulation;Moist Heat;Traction;Therapeutic exercise;Therapeutic activities;Functional mobility training;Stair training;Gait training;Ultrasound;Neuromuscular re-education;Patient/family education;Manual techniques;Taping;Energy conservation;Dry needling;Passive range of motion    PT Next Visit Plan progress cervical and lumbopelvic ROM, STM    Consulted and Agree with Plan of Care Patient            Patient will benefit from skilled therapeutic intervention in order to improve the following deficits and impairments:  Hypomobility, Decreased activity tolerance, Pain, Impaired UE functional use, Increased fascial restricitons, Decreased strength, Increased muscle spasms, Difficulty walking, Decreased range of motion, Improper body mechanics, Postural dysfunction, Impaired flexibility  Visit Diagnosis: Cervicalgia  Chronic bilateral low back pain with bilateral sciatica  Other muscle spasm  Abnormal posture     Problem List Patient Active Problem List   Diagnosis Date Noted  . Nonallopathic lesion of cervical region 04/03/2020  . Nonallopathic lesion of thoracic region 04/03/2020  . Nonallopathic lesion of lumbar region 04/03/2020  . Cervical stenosis of spine 03/31/2020  . Attention deficit disorder (ADD) in adult 03/31/2020  . Low bone density 03/17/2020  . 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[place]) 02/15/2020  . GERD (gastroesophageal reflux disease) 02/15/2020  . Allergic rhinitis 03/09/2016  . Essential hypertension 02/18/2014  . Chronic pain syndrome 02/22/2013  . Chronic insomnia 02/22/2013  . Paresthesias 10/25/2012  . Hyperlipidemia 09/18/2012    Bess Harvest, PTA 05/15/20 4:59 PM   [facility] [address]  [place], [place], [address] Phone: [phone]   Fax:  [phone]  Name: Tammy [name] MRN: [id number] Date of Birth: 01-15-1961

## 2020-05-22 ENCOUNTER — Encounter: Payer: Self-pay | Admitting: Physical Therapy

## 2020-05-22 ENCOUNTER — Ambulatory Visit: Payer: 59 | Attending: Family Medicine | Admitting: Physical Therapy

## 2020-05-22 ENCOUNTER — Other Ambulatory Visit: Payer: Self-pay

## 2020-05-22 DIAGNOSIS — M5441 Lumbago with sciatica, right side: Secondary | ICD-10-CM | POA: Insufficient documentation

## 2020-05-22 DIAGNOSIS — M542 Cervicalgia: Secondary | ICD-10-CM | POA: Diagnosis not present

## 2020-05-22 DIAGNOSIS — M5442 Lumbago with sciatica, left side: Secondary | ICD-10-CM | POA: Insufficient documentation

## 2020-05-22 DIAGNOSIS — R293 Abnormal posture: Secondary | ICD-10-CM | POA: Insufficient documentation

## 2020-05-22 DIAGNOSIS — M62838 Other muscle spasm: Secondary | ICD-10-CM | POA: Diagnosis not present

## 2020-05-22 DIAGNOSIS — G8929 Other chronic pain: Secondary | ICD-10-CM | POA: Diagnosis not present

## 2020-05-22 NOTE — Therapy (Addendum)
[facility] [address]  [place], [place], [address] Phone: [phone]   Fax:  [phone]  Physical Therapy Treatment  Patient Details  Name: Tammy [name] MRN: [id number] Date of Birth: [birth year] Referring Provider (PT): Hulan Saas, DO   Encounter Date: 05/22/2020   PT End of Session - 05/22/20 1501    Visit Number 5    Number of Visits 7    Date for PT Re-Evaluation 05/22/20    Authorization Type [facility]    PT Start Time 6015    PT Stop Time 1401    PT Time Calculation (min) 44 min    Activity Tolerance Patient tolerated treatment well    Behavior During Therapy [facility] for tasks assessed/performed           Past Medical History:  Diagnosis Date  . Arthritis   . Chronic fatigue syndrome   . Diverticulosis   . External hemorrhoids   . Hypertension   . Internal hemorrhoids   . Neuropathy     Past Surgical History:  Procedure Laterality Date  . CARDIAC CATHETERIZATION  09/18/2012  . LEFT HEART CATHETERIZATION WITH CORONARY ANGIOGRAM N/A 09/18/2012   Procedure: LEFT HEART CATHETERIZATION WITH CORONARY ANGIOGRAM;  Surgeon: Sherren Mocha, MD;  Location: [facility] CATH LAB;  Service: Cardiovascular;  Laterality: N/A;  . TONSILLECTOMY  1965    There were no vitals filed for this visit.   Subjective Assessment - 05/22/20 1319    Subjective Feels like nerve pain is not bothering her lately, but today she feels like every part of her body is slightly achey. Still notes good benefit from massage. Husband's surgery will be next week and feels it may be best for her to wrap up with therapy today in order to help him with his recovery.    Pertinent History neuropathy, HTN, chronic fatigue syndrome, L cardiac cath with angio 2013    Diagnostic tests 04/01/20 cervical xray: No acute osseous abnormality, Mild degenerative disc disease of C5-6, Facet and uncovertebral arthropathy narrows the right neuralforamina at  C3-4, C4-5, and C5-6; 04/01/20 lumbar xray: Mild-to-moderate lower lumbar spondylosis without evidence of interval progression from prior. No acute findings.    Patient Stated Goals get rid of pain    Currently in Pain? Yes    Pain Score 3     Pain Location Generalized    Pain Descriptors / Indicators Aching    Pain Type Chronic pain              [facility] PT Assessment - 05/22/20 0001      AROM   Overall AROM Comments c/o "soreness and tightness with lumbar AROM"    Cervical Flexion 38   "tight"   Cervical Extension 38   "tight"   Cervical - Right Side Bend 33   "tight"   Cervical - Left Side Bend 20   "tight"   Cervical - Right Rotation 41    Cervical - Left Rotation 54    Lumbar Flexion ankles    Lumbar Extension WFL    Lumbar - Right Side Bend distal thigh    Lumbar - Left Side Bend distal thigh    Lumbar - Right Rotation mildly limited    Lumbar - Left Rotation mildly limited      Palpation   Palpation comment 25% improvement in soft tissue restriction in B neck, but with TTP and tone still remaining along lower posterior chain, particularly in B lumbar  paraspinals                         [facility] Adult PT Treatment/Exercise - 05/22/20 0001      Lumbar Exercises: Aerobic   Nustep L4 x 6 min (UEs/LEs)      Manual Therapy   Manual Therapy Soft tissue mobilization;Passive ROM    Manual therapy comments supine    Soft tissue mobilization STM to B cervical paraspinals, UT, LS, suboccipitals    Myofascial Release B suboccipital release, manual TPR to R UT                  PT Education - 05/22/20 1500    Education Details update/consolidation of HEP- Access Code: [email or web address]; discussion on objective progress and remaining impairments    Person(s) Educated Patient    Methods Explanation;Demonstration;Tactile cues;Verbal cues;Handout    Comprehension Verbalized understanding            PT Short Term Goals - 05/22/20 1326      PT SHORT TERM GOAL #1     Title Patient to be independent with initial HEP.    Time 3    Period Weeks    Status Achieved    Target Date 05/01/20             PT Long Term Goals - 05/22/20 1326      PT LONG TERM GOAL #1   Title Patient to be independent with advanced HEP.    Time 6    Period Weeks    Status Achieved   denied questions on HEP     PT LONG TERM GOAL #2   Title Patient to demonstrate cervical AROM WFL and without pain.    Time 6    Period Weeks    Status Partially Met   improved in flexion and L sidebending     PT LONG TERM GOAL #3   Title Patient to demonstrate lumbar AROM WFL and without pain.    Time 6    Period Weeks    Status Partially Met   improved in extension, R sidebending, B rotation     PT LONG TERM GOAL #4   Title Patient to report 50% decrease in tenderness with palpation over B cervical and posterior chain musculature.    Time 6    Period Weeks    Status Partially Met   25% improvement in soft tissue restriction in B neck, with TTP and tone still remaining along lower posterior chain     PT LONG TERM GOAL #5   Title Patient to report 75% improvement in cervical and LBP by the end of the day.    Time 6    Period Weeks    Status Partially Met   reports 70% improvement                Plan - 05/22/20 1503    Clinical Impression Statement Patient reporting continued improvement in nerve pain but noting slight achiness throughout her body today. Notes that her husband's surgery will be next week and feels it may be best for her to wrap up with therapy today in order to help him with his recovery. Patient now reports 70% improvement in pain levels by the end of the work day. Demonstrating about 25% improvement in soft tissue restriction in B neck, but with TTP and tone still remaining along lower posterior chain, particularly in B lumbar paraspinals. Cervical AROM has in flexion and  L sidebending, while lumbar AROM has improved in extension, R sidebending, B rotation.  Updated and consolidated HEP to address patient's remaining impairments and to allow for maintainable HEP program. Ended session with STM and manual TPR to cervical musculature per patient's request, as she notes good benefit from MT. Ended session without complaints. Patient has demonstrated good progress towards goals and now on 30 day hold per her request.    Comorbidities neuropathy, HTN, chronic fatigue syndrome, L cardiac cath with angio 2013    Rehab Potential Good    PT Duration 6 weeks    PT Treatment/Interventions ADLs/Self Care Home Management;Cryotherapy;Electrical Stimulation;Moist Heat;Traction;Therapeutic exercise;Therapeutic activities;Functional mobility training;Stair training;Gait training;Ultrasound;Neuromuscular re-education;Patient/family education;Manual techniques;Taping;Energy conservation;Dry needling;Passive range of motion    PT Next Visit Plan 30 day hold at this time    Consulted and Agree with Plan of Care Patient           Patient will benefit from skilled therapeutic intervention in order to improve the following deficits and impairments:  Hypomobility, Decreased activity tolerance, Pain, Impaired UE functional use, Increased fascial restricitons, Decreased strength, Increased muscle spasms, Difficulty walking, Decreased range of motion, Improper body mechanics, Postural dysfunction, Impaired flexibility  Visit Diagnosis: Cervicalgia  Chronic bilateral low back pain with bilateral sciatica  Other muscle spasm  Abnormal posture     Problem List Patient Active Problem List   Diagnosis Date Noted  . Nonallopathic lesion of cervical region 04/03/2020  . Nonallopathic lesion of thoracic region 04/03/2020  . Nonallopathic lesion of lumbar region 04/03/2020  . Cervical stenosis of spine 03/31/2020  . Attention deficit disorder (ADD) in adult 03/31/2020  . Low bone density 03/17/2020  . 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[place]) 02/15/2020  .  GERD (gastroesophageal reflux disease) 02/15/2020  . Allergic rhinitis 03/09/2016  . Essential hypertension 02/18/2014  . Chronic pain syndrome 02/22/2013  . Chronic insomnia 02/22/2013  . Paresthesias 10/25/2012  . Hyperlipidemia 09/18/2012    Janene Harvey, PT, DPT 05/22/20 3:08 PM   [facility] [address]  [place], [place], [address] Phone: [phone]   Fax:  [phone]  Name: SYRENA BURGES MRN: [id number] Date of Birth: 12-Apr-1961  PHYSICAL THERAPY DISCHARGE SUMMARY  Visits from Start of Care: 5  Current functional level related to goals / functional outcomes: See above clinical impression- patient did not return during 30 day hold   Remaining deficits: Decreased cervical and lumbar ROM, pain   Education / Equipment: HEP  Plan: Patient agrees to discharge.  Patient goals were partially met. Patient is being discharged due to the patient's request.  ?????     Janene Harvey, PT, DPT 07/07/20 3:21 PM

## 2020-05-23 DIAGNOSIS — F339 Major depressive disorder, recurrent, unspecified: Secondary | ICD-10-CM | POA: Diagnosis not present

## 2020-05-23 DIAGNOSIS — F411 Generalized anxiety disorder: Secondary | ICD-10-CM | POA: Diagnosis not present

## 2020-05-29 ENCOUNTER — Ambulatory Visit: Payer: 59

## 2020-06-02 DIAGNOSIS — F909 Attention-deficit hyperactivity disorder, unspecified type: Secondary | ICD-10-CM | POA: Diagnosis not present

## 2020-06-02 DIAGNOSIS — F333 Major depressive disorder, recurrent, severe with psychotic symptoms: Secondary | ICD-10-CM | POA: Diagnosis not present

## 2020-06-02 DIAGNOSIS — F429 Obsessive-compulsive disorder, unspecified: Secondary | ICD-10-CM | POA: Diagnosis not present

## 2020-06-02 DIAGNOSIS — G4709 Other insomnia: Secondary | ICD-10-CM | POA: Diagnosis not present

## 2020-06-02 MED FILL — ARIPIPRAZOLE 5 MG TABS: 5 | 90 days supply | Qty: 45 | Fill #0

## 2020-06-04 DIAGNOSIS — F339 Major depressive disorder, recurrent, unspecified: Secondary | ICD-10-CM | POA: Diagnosis not present

## 2020-06-04 DIAGNOSIS — F410 Panic disorder [episodic paroxysmal anxiety] without agoraphobia: Secondary | ICD-10-CM | POA: Diagnosis not present

## 2020-06-18 DIAGNOSIS — F339 Major depressive disorder, recurrent, unspecified: Secondary | ICD-10-CM | POA: Diagnosis not present

## 2020-06-18 DIAGNOSIS — F410 Panic disorder [episodic paroxysmal anxiety] without agoraphobia: Secondary | ICD-10-CM | POA: Diagnosis not present

## 2020-06-18 DIAGNOSIS — F411 Generalized anxiety disorder: Secondary | ICD-10-CM | POA: Diagnosis not present

## 2020-06-19 DIAGNOSIS — F422 Mixed obsessional thoughts and acts: Secondary | ICD-10-CM | POA: Diagnosis not present

## 2020-06-28 DIAGNOSIS — G4709 Other insomnia: Secondary | ICD-10-CM | POA: Diagnosis not present

## 2020-06-28 DIAGNOSIS — F429 Obsessive-compulsive disorder, unspecified: Secondary | ICD-10-CM | POA: Diagnosis not present

## 2020-06-28 DIAGNOSIS — F333 Major depressive disorder, recurrent, severe with psychotic symptoms: Secondary | ICD-10-CM | POA: Diagnosis not present

## 2020-06-28 DIAGNOSIS — F909 Attention-deficit hyperactivity disorder, unspecified type: Secondary | ICD-10-CM | POA: Diagnosis not present

## 2020-06-30 ENCOUNTER — Telehealth: Payer: Self-pay | Admitting: Family Medicine

## 2020-06-30 DIAGNOSIS — F429 Obsessive-compulsive disorder, unspecified: Secondary | ICD-10-CM | POA: Diagnosis not present

## 2020-06-30 DIAGNOSIS — F909 Attention-deficit hyperactivity disorder, unspecified type: Secondary | ICD-10-CM | POA: Diagnosis not present

## 2020-06-30 DIAGNOSIS — F333 Major depressive disorder, recurrent, severe with psychotic symptoms: Secondary | ICD-10-CM | POA: Diagnosis not present

## 2020-06-30 DIAGNOSIS — G4709 Other insomnia: Secondary | ICD-10-CM | POA: Diagnosis not present

## 2020-06-30 DIAGNOSIS — F29 Unspecified psychosis not due to a substance or known physiological condition: Secondary | ICD-10-CM

## 2020-06-30 NOTE — Telephone Encounter (Signed)
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 Daron Offer (paranoia, delusions , hyperfocusover concern for cancer). abilify adjusted but has nto fully resolved issue.   Dr. Daron Offer of psychiatry requesting MRI of brain. He is also concerned about possible autoimmune disease/encephalitis (does not think so but would like to evaluate further with MRI)  if white matter changes significant consider labs  Unspecified psychosis as diagnosis  Fax [phone].

## 2020-07-02 DIAGNOSIS — F339 Major depressive disorder, recurrent, unspecified: Secondary | ICD-10-CM | POA: Diagnosis not present

## 2020-07-02 DIAGNOSIS — F411 Generalized anxiety disorder: Secondary | ICD-10-CM | POA: Diagnosis not present

## 2020-07-03 DIAGNOSIS — F422 Mixed obsessional thoughts and acts: Secondary | ICD-10-CM | POA: Diagnosis not present

## 2020-07-04 ENCOUNTER — Telehealth: Payer: Self-pay

## 2020-07-04 ENCOUNTER — Other Ambulatory Visit (HOSPITAL_COMMUNITY): Payer: Self-pay | Admitting: Dermatology

## 2020-07-04 DIAGNOSIS — D1801 Hemangioma of skin and subcutaneous tissue: Secondary | ICD-10-CM | POA: Diagnosis not present

## 2020-07-04 DIAGNOSIS — L281 Prurigo nodularis: Secondary | ICD-10-CM | POA: Diagnosis not present

## 2020-07-04 DIAGNOSIS — L738 Other specified follicular disorders: Secondary | ICD-10-CM | POA: Diagnosis not present

## 2020-07-04 MED FILL — MUPIROCIN 2% OINTMENT: 2 | 11 days supply | Qty: 22 | Fill #0

## 2020-07-04 NOTE — Telephone Encounter (Signed)
All those slots are samedays. Are they okay to use?

## 2020-07-04 NOTE — Telephone Encounter (Signed)
Pt is having an MRI on the 31st of this month and is looking for an appt with Dr. Yong Channel to discuss results. He has no open slots, other than samedays . Please advise

## 2020-07-04 NOTE — Telephone Encounter (Signed)
Pt scheduled  

## 2020-07-04 NOTE — Telephone Encounter (Signed)
Yes

## 2020-07-13 ENCOUNTER — Other Ambulatory Visit (HOSPITAL_COMMUNITY): Payer: Self-pay | Admitting: Psychiatry

## 2020-07-13 DIAGNOSIS — F429 Obsessive-compulsive disorder, unspecified: Secondary | ICD-10-CM | POA: Diagnosis not present

## 2020-07-13 DIAGNOSIS — G4709 Other insomnia: Secondary | ICD-10-CM | POA: Diagnosis not present

## 2020-07-13 DIAGNOSIS — F909 Attention-deficit hyperactivity disorder, unspecified type: Secondary | ICD-10-CM | POA: Diagnosis not present

## 2020-07-13 DIAGNOSIS — F333 Major depressive disorder, recurrent, severe with psychotic symptoms: Secondary | ICD-10-CM | POA: Diagnosis not present

## 2020-07-14 MED FILL — ARIPIPRAZOLE 5 MG TABS: 5 | 90 days supply | Qty: 90 | Fill #0

## 2020-07-14 MED FILL — FLUoxetine HCL 20 MG CAPS: 20 | 90 days supply | Qty: 90 | Fill #0

## 2020-07-14 MED FILL — VYVANSE 10 MG CAPSULE: 10 | 30 days supply | Qty: 30 | Fill #0

## 2020-07-14 MED FILL — clonazePAM 0.5 MG TABS: 0.5 | 30 days supply | Qty: 30 | Fill #0

## 2020-07-14 MED FILL — PANTOPRAZOLE SOD DR 40 MG T: 40 | 90 days supply | Qty: 90 | Fill #1

## 2020-07-16 DIAGNOSIS — F339 Major depressive disorder, recurrent, unspecified: Secondary | ICD-10-CM | POA: Diagnosis not present

## 2020-07-16 DIAGNOSIS — F411 Generalized anxiety disorder: Secondary | ICD-10-CM | POA: Diagnosis not present

## 2020-07-18 NOTE — Progress Notes (Signed)
Phone [phone] In person visit   Subjective:   Tammy [name] is a 59 y.o. year old very pleasant female patient who presents for/with See problem oriented charting Chief Complaint  Patient presents with  . MRI Follow Up   This visit occurred during the SARS-CoV-2 public health emergency.  Safety protocols were in place, including screening questions prior to the visit, additional usage of staff PPE, and extensive cleaning of exam room while observing appropriate contact time as indicated for disinfecting solutions.   Past Medical History-  Patient Active Problem List   Diagnosis Date Noted  . Anxiety 07/24/2020    Priority: Medium  . Attention deficit disorder (ADD) in adult 03/31/2020    Priority: Medium  . 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[place]) 02/15/2020    Priority: Medium  . GERD (gastroesophageal reflux disease) 02/15/2020    Priority: Medium  . Essential hypertension 02/18/2014    Priority: Medium  . Hyperlipidemia 09/18/2012    Priority: Medium  . Allergic rhinitis 03/09/2016    Priority: Low  . Chronic pain syndrome 02/22/2013    Priority: Low  . Chronic insomnia 02/22/2013    Priority: Low  . Paresthesias 10/25/2012    Priority: Low  . Nonallopathic lesion of cervical region 04/03/2020  . Nonallopathic lesion of thoracic region 04/03/2020  . Nonallopathic lesion of lumbar region 04/03/2020  . Cervical stenosis of spine 03/31/2020  . Low bone density 03/17/2020    Medications- reviewed and updated Current Outpatient Medications  Medication Sig Dispense Refill  . ACETAMINOPHEN-CAFFEINE PO Take by mouth.    . ARIPiprazole, sensor, (ABILIFY MYCITE) 2 MG TABS Take by mouth.    . clonazePAM (KLONOPIN) 0.5 MG tablet Take 0.5 mg by mouth as needed.    Marland Kitchen FLUoxetine (PROZAC) 20 MG capsule Take 20 mg by mouth daily.    . hydrochlorothiazide (HYDRODIURIL) 12.5 MG tablet Take 1 tablet (12.5 mg total) by mouth daily. 90 tablet 3  . MAGNESIUM  CHLORIDE-CALCIUM PO Take by mouth.    . Melatonin 1 MG/4ML LIQD Take by mouth.    . Probiotic Product (PROBIOTIC DAILY PO) Take by mouth.    Marland Kitchen VYVANSE 10 MG capsule Take 10 mg by mouth daily.    Marland Kitchen gabapentin (NEURONTIN) 100 MG capsule Take 2 capsules (200 mg total) by mouth at bedtime. (Patient not taking: Reported on 07/24/2020) 180 capsule 3  . ketoconazole (NIZORAL) 2 % cream Apply 1 application topically 2 (two) times daily. (Patient not taking: Reported on 07/24/2020) 60 g 0  . meloxicam (MOBIC) 15 MG tablet Take 1 tablet (15 mg total) by mouth daily. (Patient not taking: Reported on 05/03/2020) 10 tablet 0  . mirtazapine (REMERON) 7.5 MG tablet Take 7.5 mg by mouth at bedtime. 1/2 tab at night (Patient not taking: Reported on 07/24/2020)    . pantoprazole (PROTONIX) 20 MG tablet Take 1 tablet (20 mg total) by mouth daily. 30 tablet 5  . triamcinolone ointment (KENALOG) 0.5 % Apply 1 application topically 2 (two) times daily. Try if no improvement with ketoconzole. (Patient not taking: Reported on 07/24/2020) 30 g 0   No current facility-administered medications for this visit.     Objective:  BP 124/80   Pulse 82   Temp 97.6 F (36.4 C) (Temporal)   Resp 18   Ht 5\' 2"  (1.575 m)   Wt 157 lb 6.4 oz (71.4 kg)   LMP 01/26/2011   SpO2 99%   BMI 28.79 kg/m  Gen: NAD, resting comfortably  CV: RRR no murmurs rubs or gallops Lungs: CTAB no crackles, wheeze, rhonchi Ext: no edema Skin: warm, dry    Assessment and Plan   # GERD S:Medication:   In past was on pantoprazole 40mg  through Dr. Hilarie Fredrickson. Not as severe as it was in the past. Low level all day and worse at night and in the AM. Worse with meals.  A/P: GERD flare up. Trial 20mg  pantoprazole (she prefers lowest effective dose). If does well after 2-4 weeks can go down to pepcid over the counter twice a day. If not effective after 2 weeks, would try the 40mg  pantoprazole she has at home.    # ADD S:patient on vyvanse very low dose 10  mg once daily as needed. Last rx # 30 on 07/14/20 . Last fill prior to that was 7/15 so 30 pills islasting up ot 3 months. PDMP reviewed today  A/P: well controlled when needed- I am willing to refill as she transitions to [facility] with Dr. Daron Offer as he cannot prescribe controlled substances on that platform or to new psychiatrist if her insurance is not covered. Also similar for anxiety/clonazepam  # anxiety S:patient on clonazepam with #30 filled on 07/14/20 0.5mg  daily prn- last fill prior to that was 12/17/19 so using very sparingly. PDMP reviewed today  A/P: well controlled when needed- once again willing to refill this.   If needed would also fill SSRI and abilify short term for transition to either new visits with Dr. Daron Offer or new psychiatrist. Was having weight gain with remeron- was able to come off and still sleeping ok.   #hyperlipidemia S: Medication: none  Lab Results  Component Value Date   CHOL 233 (H) 02/15/2020   HDL 87 02/15/2020   LDLCALC 130 (H) 02/15/2020   TRIG 68 02/15/2020   CHOLHDL 2.7 02/15/2020   A/P:  Noted small vessel disease on MRI- would like to focus on healthy eating/continued exercise and recheck next year.   Recommended follow up:  Lets plan on scheduling physical 1 year out from last physical in may. Can cancel December 2nd visit Future Appointments  Date Time Provider [place]  08/21/2020  4:20 PM [name], MD [facility]   Lab/Order associations:   ICD-10-CM   1. Gastroesophageal reflux disease, unspecified whether esophagitis present  K21.9   2. Anxiety  F41.9   3. Attention deficit disorder (ADD) in adult  F98.8   4. Hyperlipidemia, unspecified hyperlipidemia type  E78.5     Meds ordered this encounter  Medications  . pantoprazole (PROTONIX) 20 MG tablet    Sig: Take 1 tablet (20 mg total) by mouth daily.    Dispense:  30 tablet    Refill:  5    Return precautions advised.  Garret Reddish, MD

## 2020-07-18 NOTE — Patient Instructions (Addendum)
GERD flare up. Trial 20mg  pantoprazole (she prefers lowest effective dose). If does well after 2-4 weeks can go down to pepcid over the counter twice a day. If not effective after 2 weeks, would try the 40mg  pantoprazole she has at home.    Lets plan on scheduling physical 1 year out from last physical in may. Can cancel December 2nd visit

## 2020-07-20 ENCOUNTER — Ambulatory Visit
Admission: RE | Admit: 2020-07-20 | Discharge: 2020-07-20 | Disposition: A | Discharge status: Home / Self Care | Payer: 59 | Source: Ambulatory Visit | Attending: Family Medicine | Admitting: Family Medicine

## 2020-07-20 ENCOUNTER — Other Ambulatory Visit: Payer: Self-pay

## 2020-07-20 DIAGNOSIS — F29 Unspecified psychosis not due to a substance or known physiological condition: Secondary | ICD-10-CM

## 2020-07-20 DIAGNOSIS — R519 Headache, unspecified: Secondary | ICD-10-CM | POA: Diagnosis not present

## 2020-07-20 MED ORDER — GADOBENATE DIMEGLUMINE 529 MG/ML IV SOLN
14.0000 mL | Freq: Once | INTRAVENOUS | Status: AC | PRN
  Administered 2020-07-20: 14 mL via INTRAVENOUS

## 2020-07-23 DIAGNOSIS — F411 Generalized anxiety disorder: Secondary | ICD-10-CM | POA: Diagnosis not present

## 2020-07-23 DIAGNOSIS — F339 Major depressive disorder, recurrent, unspecified: Secondary | ICD-10-CM | POA: Diagnosis not present

## 2020-07-24 ENCOUNTER — Other Ambulatory Visit: Payer: Self-pay

## 2020-07-24 ENCOUNTER — Encounter: Payer: Self-pay | Admitting: Family Medicine

## 2020-07-24 ENCOUNTER — Ambulatory Visit (INDEPENDENT_AMBULATORY_CARE_PROVIDER_SITE_OTHER): Payer: 59 | Admitting: Family Medicine

## 2020-07-24 ENCOUNTER — Ambulatory Visit: Payer: 59 | Admitting: Family Medicine

## 2020-07-24 VITALS — BP 124/80 | HR 82 | Temp 97.6°F | Resp 18 | Ht 62.0 in | Wt 157.4 lb

## 2020-07-24 DIAGNOSIS — F419 Anxiety disorder, unspecified: Secondary | ICD-10-CM | POA: Diagnosis not present

## 2020-07-24 DIAGNOSIS — F988 Other specified behavioral and emotional disorders with onset usually occurring in childhood and adolescence: Secondary | ICD-10-CM | POA: Diagnosis not present

## 2020-07-24 DIAGNOSIS — K219 Gastro-esophageal reflux disease without esophagitis: Secondary | ICD-10-CM | POA: Diagnosis not present

## 2020-07-24 DIAGNOSIS — E785 Hyperlipidemia, unspecified: Secondary | ICD-10-CM | POA: Diagnosis not present

## 2020-07-24 MED ORDER — PANTOPRAZOLE SODIUM 20 MG PO TBEC: 20.0000 mg | DELAYED_RELEASE_TABLET | Freq: Every day | ORAL | 5 refills | Status: DC

## 2020-07-24 MED FILL — PANTOPRAZOLE SOD DR 20 MG T: 20 | 30 days supply | Qty: 30 | Fill #0

## 2020-07-30 DIAGNOSIS — F339 Major depressive disorder, recurrent, unspecified: Secondary | ICD-10-CM | POA: Diagnosis not present

## 2020-08-07 DIAGNOSIS — F339 Major depressive disorder, recurrent, unspecified: Secondary | ICD-10-CM | POA: Diagnosis not present

## 2020-08-07 DIAGNOSIS — F411 Generalized anxiety disorder: Secondary | ICD-10-CM | POA: Diagnosis not present

## 2020-08-12 MED FILL — HYDROCHLOROTHIAZIDE 12.5 MG: 12.5 | 90 days supply | Qty: 90 | Fill #2

## 2020-08-17 ENCOUNTER — Other Ambulatory Visit (HOSPITAL_COMMUNITY): Payer: Self-pay | Admitting: Psychiatry

## 2020-08-17 DIAGNOSIS — F909 Attention-deficit hyperactivity disorder, unspecified type: Secondary | ICD-10-CM | POA: Diagnosis not present

## 2020-08-17 DIAGNOSIS — G4709 Other insomnia: Secondary | ICD-10-CM | POA: Diagnosis not present

## 2020-08-17 DIAGNOSIS — F333 Major depressive disorder, recurrent, severe with psychotic symptoms: Secondary | ICD-10-CM | POA: Diagnosis not present

## 2020-08-17 DIAGNOSIS — F429 Obsessive-compulsive disorder, unspecified: Secondary | ICD-10-CM | POA: Diagnosis not present

## 2020-08-18 MED FILL — clonazePAM 0.5 MG TABS: 0.5 | 31 days supply | Qty: 31 | Fill #0

## 2020-08-18 MED FILL — VYVANSE 10 MG CAPSULE: 10 | 90 days supply | Qty: 90 | Fill #0

## 2020-08-20 DIAGNOSIS — F339 Major depressive disorder, recurrent, unspecified: Secondary | ICD-10-CM | POA: Diagnosis not present

## 2020-08-20 DIAGNOSIS — F411 Generalized anxiety disorder: Secondary | ICD-10-CM | POA: Diagnosis not present

## 2020-08-21 ENCOUNTER — Ambulatory Visit: Payer: 59 | Admitting: Family Medicine

## 2020-08-21 MED FILL — PANTOPRAZOLE SOD DR 20 MG T: 20 | 30 days supply | Qty: 30 | Fill #1

## 2020-08-27 DIAGNOSIS — F411 Generalized anxiety disorder: Secondary | ICD-10-CM | POA: Diagnosis not present

## 2020-08-27 DIAGNOSIS — F339 Major depressive disorder, recurrent, unspecified: Secondary | ICD-10-CM | POA: Diagnosis not present

## 2020-09-03 DIAGNOSIS — F339 Major depressive disorder, recurrent, unspecified: Secondary | ICD-10-CM | POA: Diagnosis not present

## 2020-09-16 DIAGNOSIS — F339 Major depressive disorder, recurrent, unspecified: Secondary | ICD-10-CM | POA: Diagnosis not present

## 2020-09-23 ENCOUNTER — Encounter: Payer: Self-pay | Admitting: Psychiatry

## 2020-09-23 ENCOUNTER — Other Ambulatory Visit: Payer: Self-pay

## 2020-09-23 ENCOUNTER — Other Ambulatory Visit: Payer: Self-pay | Admitting: Psychiatry

## 2020-09-23 ENCOUNTER — Ambulatory Visit (INDEPENDENT_AMBULATORY_CARE_PROVIDER_SITE_OTHER): Payer: 59 | Admitting: Psychiatry

## 2020-09-23 VITALS — BP 135/84 | HR 98 | Ht 62.0 in | Wt 150.0 lb

## 2020-09-23 DIAGNOSIS — F419 Anxiety disorder, unspecified: Secondary | ICD-10-CM | POA: Diagnosis not present

## 2020-09-23 DIAGNOSIS — F331 Major depressive disorder, recurrent, moderate: Secondary | ICD-10-CM

## 2020-09-23 DIAGNOSIS — F988 Other specified behavioral and emotional disorders with onset usually occurring in childhood and adolescence: Secondary | ICD-10-CM

## 2020-09-23 MED ORDER — LISDEXAMFETAMINE DIMESYLATE 20 MG PO CAPS: 20.0000 mg | ORAL_CAPSULE | Freq: Every day | ORAL | 0 refills | Status: DC

## 2020-09-23 MED ORDER — FLUOXETINE HCL 20 MG PO CAPS: 20.0000 mg | ORAL_CAPSULE | Freq: Every day | ORAL | 0 refills | Status: DC

## 2020-09-23 MED ORDER — ARIPIPRAZOLE 5 MG PO TABS: 2.5000 mg | ORAL_TABLET | Freq: Every day | ORAL | 0 refills | Status: DC

## 2020-09-23 MED ORDER — CLONAZEPAM 0.5 MG PO TABS: 0.5000 mg | ORAL_TABLET | ORAL | 1 refills | Status: DC | PRN

## 2020-09-23 MED FILL — PANTOPRAZOLE SOD DR 20 MG T: 20 | 30 days supply | Qty: 30 | Fill #2

## 2020-09-23 MED FILL — clonazePAM 0.5 MG TABS: 0.5 | 30 days supply | Qty: 30 | Fill #0

## 2020-09-23 MED FILL — VYVANSE 20 MG CAPSULE: 20 | 90 days supply | Qty: 90 | Fill #0
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
## 2020-09-26 MED FILL — ARIPIPRAZOLE 5 MG TABS: 5 | 90 days supply | Qty: 45 | Fill #0

## 2020-10-02 DIAGNOSIS — F332 Major depressive disorder, recurrent severe without psychotic features: Secondary | ICD-10-CM | POA: Diagnosis not present

## 2020-10-02 DIAGNOSIS — F411 Generalized anxiety disorder: Secondary | ICD-10-CM | POA: Diagnosis not present

## 2020-10-16 DIAGNOSIS — F411 Generalized anxiety disorder: Secondary | ICD-10-CM | POA: Diagnosis not present

## 2020-10-29 ENCOUNTER — Other Ambulatory Visit: Payer: Self-pay | Admitting: Family Medicine

## 2020-10-30 DIAGNOSIS — F411 Generalized anxiety disorder: Secondary | ICD-10-CM | POA: Diagnosis not present

## 2020-10-30 DIAGNOSIS — F332 Major depressive disorder, recurrent severe without psychotic features: Secondary | ICD-10-CM | POA: Diagnosis not present

## 2020-11-11 ENCOUNTER — Ambulatory Visit (INDEPENDENT_AMBULATORY_CARE_PROVIDER_SITE_OTHER): Payer: 59 | Admitting: Psychiatry

## 2020-11-11 ENCOUNTER — Other Ambulatory Visit: Payer: Self-pay

## 2020-11-11 ENCOUNTER — Encounter: Payer: Self-pay | Admitting: Psychiatry

## 2020-11-11 ENCOUNTER — Other Ambulatory Visit: Payer: Self-pay | Admitting: Psychiatry

## 2020-11-11 DIAGNOSIS — F988 Other specified behavioral and emotional disorders with onset usually occurring in childhood and adolescence: Secondary | ICD-10-CM

## 2020-11-11 DIAGNOSIS — F331 Major depressive disorder, recurrent, moderate: Secondary | ICD-10-CM | POA: Diagnosis not present

## 2020-11-11 DIAGNOSIS — F419 Anxiety disorder, unspecified: Secondary | ICD-10-CM

## 2020-11-11 MED ORDER — FLUOXETINE HCL 20 MG PO CAPS
20.0000 mg | ORAL_CAPSULE | Freq: Every day | ORAL | 0 refills | Status: DC
Start: 2020-11-11 — End: 2020-11-11

## 2020-11-11 MED ORDER — LISDEXAMFETAMINE DIMESYLATE 10 MG PO CAPS: 10.0000 mg | ORAL_CAPSULE | Freq: Every day | ORAL | 0 refills | Status: DC

## 2020-11-11 MED ORDER — ARIPIPRAZOLE 2 MG PO TABS: 2.0000 mg | ORAL_TABLET | Freq: Every day | ORAL | 2 refills | Status: DC

## 2020-11-11 MED ORDER — LISDEXAMFETAMINE DIMESYLATE 10 MG PO CAPS
10.0000 mg | ORAL_CAPSULE | Freq: Every day | ORAL | 0 refills | Status: DC
Start: 2020-11-11 — End: 2020-11-11

## 2020-11-11 MED FILL — ARIPIPRAZOLE 2 MG TABS: 2 | 30 days supply | Qty: 30 | Fill #0

## 2020-11-11 NOTE — Progress Notes (Signed)
HONESTY MENTA [id number] [birth year] 60 y.o.  Subjective:   Patient ID:  Tammy [name] is a 60 y.o. (DOB May 30, 1961) female.  Chief Complaint:  Chief Complaint  Patient presents with  . Depression  . Anxiety    HPI Tammy [name] presents to the office today for follow-up of anxiety and depression. Her mother passed away and spent several weeks in [place] with her before she passed. Mother had been dx'd with pancreatic cancer 2.5 years ago. She reports that this was a form of "exposure therapy" with having to fly. Just returned a weeks ago. Worked some remotely from [place].   Feels "a shadow of my former self" and having continued generalized anxiety. She reports that her affect is flat. She reports that she notices some possible affective dulling. She reports that she also feels that sadness has been dampened. Reports that she felt sad and cried when her mother passed but not as much as she would expect. She reports, "I think I feel sad in general."  Diminished interest in things and has to force herself to engage and do things she normally enjoys. She reports that she is quiet and does not talk much. "Each day is still an effort." She reports that she pushes herself to do what she needs to do for work. She reports that her sleep has been ok. She reports that she has been tired at the end of the day. Denies SI.   She reports that rumination around safety continues and this also pertains to safety. Some catastrophic thoughts and occasional intrusive thoughts. Has continued Vyvanse 10 mg daily. Would like to continue Vyvanse 10 mg po qd and use Vyvanse 20 mg po qd prn. She reports that concentration has been "ok." Used Klonopin prn more with flying and around mother's death.   Past Psychiatric Medication Trials:. Cymbalta Zoloft- Interfered with concentration and thinking was not clear Lexapro- Nocturnal panic attacks.   Prozac- Was on 40 mg of Prozac at the time she was having akathisia with  Abilify 2 mg. Higher doses seemed to exacerbate akathisia Abilify- helpful. Started on 2 mg daily. Had akathisia and twitching at 5 mg Rexulti- Had very low motivation Klonopin- Takes as needed. Did not help with rumination.  Ambien Vyvanse- took 20 mg in the past for ADHD. Re-started 10 mg dose. Remeron- Gained 20 lbs.  Gabapentin- Given for neuropathy and caused cloudy thoughts.   Had [facility] testing that indicated adverse effects to Zoloft, Lexapro, and Paxil.  [place] Office Visit from 07/24/2020 in [place] Visit from 03/31/2020 in [place] Visit from 02/15/2020 in [place]  PHQ-2 Total Score 0 0 0  PHQ-9 Total Score 0 0 0       Review of Systems:  Review of Systems  Cardiovascular: Negative for palpitations.  Musculoskeletal: Negative for gait problem.  Neurological: Negative for tremors.  Psychiatric/Behavioral:       Please refer to HPI    Medications: I have reviewed the patient's current medications.  Current Outpatient Medications  Medication Sig Dispense Refill  . ACETAMINOPHEN-CAFFEINE PO Take by mouth in the morning.    . clonazePAM (KLONOPIN) 0.5 MG tablet Take 1 tablet (0.5 mg total) by mouth as needed. 30 tablet 1  . hydrochlorothiazide (HYDRODIURIL) 12.5 MG tablet Take 1 tablet (12.5 mg total) by mouth daily. 90 tablet 3  . MAGNESIUM CHLORIDE-CALCIUM PO Take by mouth.    Marland Kitchen  Melatonin 1 MG/4ML LIQD Take 5 mg by mouth.    . pantoprazole (PROTONIX) 20 MG tablet TAKE 1 TABLET BY MOUTH DAILY. 30 tablet 5  . Probiotic Product (PROBIOTIC DAILY PO) Take by mouth.    . ARIPiprazole (ABILIFY) 2 MG tablet Take 1 tablet (2 mg total) by mouth daily. 30 tablet 2  . FLUoxetine (PROZAC) 20 MG capsule Take 1 capsule (20 mg total) by mouth daily. 90 capsule 0  . lisdexamfetamine (VYVANSE) 10 MG capsule Take 1 capsule (10 mg total) by mouth daily. 90 capsule 0  .  triamcinolone ointment (KENALOG) 0.5 % Apply 1 application topically 2 (two) times daily. Try if no improvement with ketoconzole. 30 g 0   No current facility-administered medications for this visit.    Medication Side Effects: Other: Affective dulling  Allergies:  Allergies  Allergen Reactions  . Eggs Or Egg-Derived Products Other (See Comments)    Makes pt feel like burping up rotten eggs    Past Medical History:  Diagnosis Date  . Arthritis   . Chronic fatigue syndrome   . Diverticulosis   . External hemorrhoids   . Hypertension   . Internal hemorrhoids   . Neuropathy   . Spinal stenosis     Family History  Problem Relation Age of Onset  . Heart attack Maternal Grandmother 50  . Arthritis Maternal Grandmother   . Hyperlipidemia Maternal Grandmother   . Heart disease Maternal Grandmother   . Diabetes Maternal Grandmother   . Pancreatic cancer Mother        doing well 2 years post diagnosis in 2021 at 20.   Marland Kitchen Anxiety disorder Mother   . Depression Mother   . Colon polyps Father   . Healthy Sister   . Healthy Brother   . Depression Brother   . Heart attack Brother        age 82  . Suicidality Maternal Grandfather   . Colon cancer Neg Hx   . Esophageal cancer Neg Hx   . Kidney disease Neg Hx     Social History   Socioeconomic History  . Marital status: Married    Spouse name: Not on file  . Number of children: 2  . Years of education: Not on file  . Highest education level: Not on file  Occupational History  . Occupation: Diplomatic Services operational officer: [employer]  Tobacco Use  . Smoking status: Never Smoker  . Smokeless tobacco: Never Used  Substance and Sexual Activity  . Alcohol use: No  . Drug use: No  . Sexual activity: Yes  Other Topics Concern  . Not on file  Social History Narrative   Married. 2 adopted children- adopted daughter (57) when single after first marriage from [place]. Son from [place] with 2nd husband(18) in 2021. 1st husband local  physician Linus Mako      LCSW with [employer]      Hobbies: animal rescue- has litter of kittens in 2021, walking, time outside   Social Determinants of Health   Financial Resource Strain: Not on file  Food Insecurity: Not on file  Transportation Needs: Not on file  Physical Activity: Not on file  Stress: Not on file  Social Connections: Not on file  Intimate Partner Violence: Not on file    Past Medical History, Surgical history, Social history, and Family history were reviewed and updated as appropriate.   Please see review of systems for further details on the patient's review from today.   Objective:  Physical Exam:  LMP 01/26/2011   Physical Exam Constitutional:      General: She is not in acute distress. Musculoskeletal:        General: No deformity.  Neurological:     Mental Status: She is alert and oriented to person, place, and time.     Coordination: Coordination normal.  Psychiatric:        Attention and Perception: Attention and perception normal. She does not perceive auditory or visual hallucinations.        Mood and Affect: Mood is anxious and depressed. Affect is blunt. Affect is not labile, angry or inappropriate.        Speech: Speech normal.        Behavior: Behavior normal. Behavior is cooperative.        Thought Content: Thought content normal. Thought content is not paranoid or delusional. Thought content does not include homicidal or suicidal ideation. Thought content does not include homicidal or suicidal plan.        Cognition and Memory: Cognition and memory normal.        Judgment: Judgment normal.     Comments: Insight intact     Lab Review:     Component Value Date/Time   NA 138 02/15/2020 1627   K 3.8 02/15/2020 1627   CL 103 02/15/2020 1627   CO2 26 02/15/2020 1627   GLUCOSE 87 02/15/2020 1627   BUN 16 02/15/2020 1627   CREATININE 0.83 02/15/2020 1627   CALCIUM 9.4 02/15/2020 1627   PROT 6.6 02/15/2020 1627   ALBUMIN 4.9  10/15/2019 1345   AST 18 02/15/2020 1627   ALT 16 02/15/2020 1627   ALKPHOS 50 10/15/2019 1345   BILITOT 0.3 02/15/2020 1627   GFRNONAA >90 09/18/2012 0525   GFRAA >90 09/18/2012 0525       Component Value Date/Time   WBC 3.4 (L) 02/15/2020 1627   RBC 4.60 02/15/2020 1627   HGB 12.9 02/15/2020 1627   HCT 39.2 02/15/2020 1627   PLT 181 02/15/2020 1627   MCV 85.2 02/15/2020 1627   MCH 28.0 02/15/2020 1627   MCHC 32.9 02/15/2020 1627   RDW 12.7 02/15/2020 1627   LYMPHSABS 955 02/15/2020 1627   MONOABS 0.2 10/15/2019 1345   EOSABS 122 02/15/2020 1627   BASOSABS 10 02/15/2020 1627    No results found for: POCLITH, LITHIUM   No results found for: PHENYTOIN, PHENOBARB, VALPROATE, CBMZ   .res Assessment: Plan:   Pt seen for 30 minutes and time spent discussing treatment options. Discussed pt's concerns that Abilify may be causing some affective dulling and that it no longer seems to be as effective as it once was. Discussed gradually reducing Abilify since she experienced acute worsening in mood and anxiety in the past after discontinuation of Abilify. Discussed that she may be able to tolerate higher doses of Prozac if/when Abilify is reduced since she experienced akathisia in the past with this combination.  Will decrease Abilify to 2 mg po qd.  Continue Prozac 20 mg po qd for mood and anxiety. Consider increase to 30 mg po qd in the future. She prefers to continue Vyvanse 10 mg po q am and use 20 mg in lieu of 10 mg on occasion.  Continue Klonopin prn anxiety.  Pt to follow-up in 6 weeks or sooner if clinically indicated.  Patient advised to contact office with any questions, adverse effects, or acute worsening in signs and symptoms.  Tammy [name] was seen today for depression and anxiety.  Diagnoses and all orders for this visit:  Attention deficit disorder (ADD) in adult -     Discontinue: lisdexamfetamine (VYVANSE) 10 MG capsule; Take 1 capsule (10 mg total) by mouth daily. -      lisdexamfetamine (VYVANSE) 10 MG capsule; Take 1 capsule (10 mg total) by mouth daily.  Moderate recurrent major depression (HCC) -     ARIPiprazole (ABILIFY) 2 MG tablet; Take 1 tablet (2 mg total) by mouth daily. -     FLUoxetine (PROZAC) 20 MG capsule; Take 1 capsule (20 mg total) by mouth daily.  Anxiety -     FLUoxetine (PROZAC) 20 MG capsule; Take 1 capsule (20 mg total) by mouth daily.     Please see After Visit Summary for patient specific instructions.  Future Appointments  Date Time Provider [place]  12/24/2020 12:45 PM [facility] Headings, PMHNP [facility] None  02/18/2021  8:00 AM [name], MD [facility]    No orders of the defined types were placed in this encounter.   -------------------------------

## 2020-11-13 DIAGNOSIS — F411 Generalized anxiety disorder: Secondary | ICD-10-CM | POA: Diagnosis not present

## 2020-11-13 DIAGNOSIS — F332 Major depressive disorder, recurrent severe without psychotic features: Secondary | ICD-10-CM | POA: Diagnosis not present

## 2020-11-15 MED FILL — HYDROCHLOROTHIAZIDE 12.5 MG: 12.5 | 90 days supply | Qty: 90 | Fill #3

## 2020-11-26 MED FILL — VYVANSE 10 MG CAPSULE: 10 | 90 days supply | Qty: 90 | Fill #0

## 2020-11-27 DIAGNOSIS — F411 Generalized anxiety disorder: Secondary | ICD-10-CM | POA: Diagnosis not present

## 2020-11-27 DIAGNOSIS — F332 Major depressive disorder, recurrent severe without psychotic features: Secondary | ICD-10-CM | POA: Diagnosis not present

## 2020-12-08 MED FILL — ARIPIPRAZOLE 2 MG TABS: 2 | 30 days supply | Qty: 30 | Fill #1

## 2020-12-08 MED FILL — FLUoxetine HCL 20 MG CAPS: 20 | 90 days supply | Qty: 90 | Fill #0

## 2020-12-11 DIAGNOSIS — F411 Generalized anxiety disorder: Secondary | ICD-10-CM | POA: Diagnosis not present

## 2020-12-11 DIAGNOSIS — F332 Major depressive disorder, recurrent severe without psychotic features: Secondary | ICD-10-CM | POA: Diagnosis not present

## 2020-12-12 ENCOUNTER — Other Ambulatory Visit (HOSPITAL_BASED_OUTPATIENT_CLINIC_OR_DEPARTMENT_OTHER): Payer: Self-pay

## 2020-12-24 ENCOUNTER — Ambulatory Visit (INDEPENDENT_AMBULATORY_CARE_PROVIDER_SITE_OTHER): Payer: 59 | Admitting: Psychiatry

## 2020-12-24 ENCOUNTER — Encounter: Payer: Self-pay | Admitting: Psychiatry

## 2020-12-24 ENCOUNTER — Other Ambulatory Visit: Payer: Self-pay

## 2020-12-24 ENCOUNTER — Other Ambulatory Visit (HOSPITAL_COMMUNITY): Payer: Self-pay

## 2020-12-24 DIAGNOSIS — F331 Major depressive disorder, recurrent, moderate: Secondary | ICD-10-CM | POA: Diagnosis not present

## 2020-12-24 DIAGNOSIS — F419 Anxiety disorder, unspecified: Secondary | ICD-10-CM | POA: Diagnosis not present

## 2020-12-24 DIAGNOSIS — F988 Other specified behavioral and emotional disorders with onset usually occurring in childhood and adolescence: Secondary | ICD-10-CM

## 2020-12-24 MED ORDER — LISDEXAMFETAMINE DIMESYLATE 20 MG PO CAPS
20.0000 mg | ORAL_CAPSULE | Freq: Every day | ORAL | 0 refills | Status: DC
  Filled 2020-12-24 – 2021-01-29 (×3): qty 90, 90d supply, fill #0

## 2020-12-24 MED ORDER — ARIPIPRAZOLE 2 MG PO TABS
2.0000 mg | ORAL_TABLET | Freq: Every day | ORAL | 0 refills | Status: DC
  Filled 2020-12-24 – 2021-01-09 (×2): qty 90, 90d supply, fill #0

## 2020-12-24 MED ORDER — FLUOXETINE HCL 10 MG PO CAPS
30.0000 mg | ORAL_CAPSULE | Freq: Every day | ORAL | 1 refills | Status: DC
  Filled 2020-12-24: qty 90, 30d supply, fill #0

## 2020-12-24 NOTE — Progress Notes (Signed)
Tammy [name] [id number] [birth year] 60 y.o.  Subjective:   Patient ID:  Tammy [name] is a 60 y.o. (DOB 01-10-1961) female.  Chief Complaint:  Chief Complaint  Patient presents with  . Depression  . Anxiety    HPI Tammy [name] presents to the office today for follow-up of anxiety and depression. She reports that she noticed an increase in anxiety about 2-3 weeks after reducing Abilify and then increased anxiety subsided. She reports that she has some moments of anxiety. She reports some days anxiety is worse than others. Occasional rumination and worry. She reports that she will second guess herself and decisions she makes. No change in catastrophic thoughts. Denies any worsening anxiety around safety.  She reports that intrusive thoughts have been "ok." She reports that she continues to experience some affective dulling and not feeling joy. "I feel like I am still just surviving." Denies sad mood or irritability. Energy has been ok and has energy to do what she needs to do daily. She reports motivation has been low and she had to push herself to do certain things. She reports that her concentration has not been as good this week. Notices concentration is not as good with Vyvanse 10 mg compared to 20 mg. She reports that sleep has been ok and estimates sleeping about 6 hours a night. She reports that in the past she used to sleep 8 hours. Appetite has been ok. Enjoys being outside.   Continues to work remotely. She reports limited face to face interaction with others except her husband and daughter. She reports that she continues to be more withdrawn.   Has been seeing [facility] for therapy.   Has not taken Klonopin prn recently.   Past Psychiatric Medication Trials:. Cymbalta Zoloft- Interfered with concentration and thinking was not clear Lexapro- Nocturnal panic attacks.  Prozac- Was on 40 mg of Prozac at the time she was having akathisia with Abilify 2 mg. Higher doses seemed  to exacerbate akathisia Abilify- helpful. Started on 2 mg daily. Had akathisia and twitching at 5 mg Rexulti- Had very low motivation and fatigue Lamictal- rash Klonopin- Takes as needed. Did not help with rumination.  Ambien Vyvanse- took 20 mg in the past for ADHD. Re-started 10 mg dose. Remeron- Gained 20 lbs.  Gabapentin- Given for neuropathy and caused cloudy thoughts.  Lamotrigine- tingling/burning around mouth and vagina. No rash or blisters.  May have been helpful for mood.  Had [facility] testing that indicated adverse effects to Zoloft, Lexapro, and Paxil.  [place] Office Visit from 07/24/2020 in [place] Visit from 03/31/2020 in [place] Visit from 02/15/2020 in [place]  PHQ-2 Total Score 0 0 0  PHQ-9 Total Score 0 0 0       Review of Systems:  Review of Systems  Gastrointestinal: Negative.   Musculoskeletal: Negative for gait problem.  Neurological: Negative for tremors.  Psychiatric/Behavioral:       Please refer to HPI    Medications: I have reviewed the patient's current medications.  Current Outpatient Medications  Medication Sig Dispense Refill  . ACETAMINOPHEN-CAFFEINE PO Take by mouth in the morning.    Marland Kitchen FLUoxetine (PROZAC) 10 MG capsule Take 3 capsules (30 mg total) by mouth daily. 90 capsule 1  . hydrochlorothiazide (HYDRODIURIL) 12.5 MG tablet TAKE 1 TABLET BY MOUTH ONCE DAILY 90 tablet 3  . lisdexamfetamine (VYVANSE) 20 MG capsule Take 1 capsule (20 mg  total) by mouth daily. 90 capsule 0  . MAGNESIUM CHLORIDE-CALCIUM PO Take by mouth.    . Melatonin 1 MG/4ML LIQD Take 5 mg by mouth.    . pantoprazole (PROTONIX) 20 MG tablet TAKE 1 TABLET BY MOUTH DAILY. 30 tablet 5  . Probiotic Product (PROBIOTIC DAILY PO) Take by mouth.    . ARIPiprazole (ABILIFY) 2 MG tablet Take 1 tablet (2 mg total) by mouth daily. 90 tablet 0  . clonazePAM (KLONOPIN) 0.5 MG  tablet TAKE 1 TABLET BY MOUTH ONCE A DAY AS NEEDED 30 tablet 1  . clonazePAM (KLONOPIN) 0.5 MG tablet TAKE 1 TABLET BY MOUTH DAILY AS NEEDED FOR PANIC 31 tablet 0  . clonazePAM (KLONOPIN) 0.5 MG tablet TAKE 1 TABLET BY MOUTH ONCE DAILY AS NEEDED FOR PANIC 30 tablet 0  . mupirocin ointment (BACTROBAN) 2 % APPLY TO THE AFFECTED AREA(S) TWO TIMES DAILY AS NEEDED TO OPEN SORES 22 g 3  . triamcinolone ointment (KENALOG) 0.5 % Apply 1 application topically 2 (two) times daily. Try if no improvement with ketoconzole. 30 g 0   No current facility-administered medications for this visit.    Medication Side Effects: None  Allergies:  Allergies  Allergen Reactions  . Eggs Or Egg-Derived Products Other (See Comments)    Makes pt feel like burping up rotten eggs    Past Medical History:  Diagnosis Date  . Arthritis   . Chronic fatigue syndrome   . Diverticulosis   . External hemorrhoids   . Hypertension   . Internal hemorrhoids   . Neuropathy   . Spinal stenosis     Family History  Problem Relation Age of Onset  . Heart attack Maternal Grandmother 50  . Arthritis Maternal Grandmother   . Hyperlipidemia Maternal Grandmother   . Heart disease Maternal Grandmother   . Diabetes Maternal Grandmother   . Pancreatic cancer Mother        doing well 2 years post diagnosis in 2021 at 42.   Marland Kitchen Anxiety disorder Mother   . Depression Mother   . Colon polyps Father   . Healthy Sister   . Healthy Brother   . Depression Brother   . Heart attack Brother        age 36  . Suicidality Maternal Grandfather   . Colon cancer Neg Hx   . Esophageal cancer Neg Hx   . Kidney disease Neg Hx     Social History   Socioeconomic History  . Marital status: Married    Spouse name: Not on file  . Number of children: 2  . Years of education: Not on file  . Highest education level: Not on file  Occupational History  . Occupation: Diplomatic Services operational officer: [employer]  Tobacco Use  . Smoking status: Never  Smoker  . Smokeless tobacco: Never Used  Substance and Sexual Activity  . Alcohol use: No  . Drug use: No  . Sexual activity: Yes  Other Topics Concern  . Not on file  Social History Narrative   Married. 2 adopted children- adopted daughter (58) when single after first marriage from [place]. Son from [place] with 2nd husband(18) in 2021. 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   Please see review of systems for further details on the patient's review from today.   Objective:   Physical Exam:  LMP 01/26/2011   Physical Exam Constitutional:      General: She is not in acute distress. Musculoskeletal:        General: No deformity.  Neurological:     Mental Status: She is alert and oriented to person, place, and time.     Coordination: Coordination normal.  Psychiatric:        Attention and Perception: Attention and perception normal. She does not perceive auditory or visual hallucinations.        Mood and Affect: Mood is anxious and depressed. Affect is not labile, blunt, angry or inappropriate.        Speech: Speech normal.        Behavior: Behavior normal.        Thought Content: Thought content normal. Thought content is not paranoid or delusional. Thought content does not include homicidal or suicidal ideation. Thought content does not include homicidal or suicidal plan.        Cognition and Memory: Cognition and memory normal.        Judgment: Judgment normal.     Comments:  Insight intact     Lab Review:     Component Value Date/Time   NA 138 02/15/2020 1627   K 3.8 02/15/2020 1627   CL 103 02/15/2020 1627   CO2 26 02/15/2020 1627   GLUCOSE 87 02/15/2020 1627   BUN 16 02/15/2020 1627   CREATININE 0.83 02/15/2020 1627   CALCIUM 9.4 02/15/2020 1627   PROT 6.6 02/15/2020 1627   ALBUMIN 4.9 10/15/2019 1345   AST 18 02/15/2020 1627   ALT 16 02/15/2020 1627   ALKPHOS 50 10/15/2019 1345   BILITOT 0.3 02/15/2020 1627   GFRNONAA >90 09/18/2012 0525   GFRAA >90 09/18/2012 0525       Component Value Date/Time   WBC 3.4 (L) 02/15/2020 1627   RBC 4.60 02/15/2020 1627   HGB 12.9 02/15/2020 1627   HCT 39.2 02/15/2020 1627   PLT 181 02/15/2020 1627   MCV 85.2 02/15/2020 1627   MCH 28.0 02/15/2020 1627   MCHC 32.9 02/15/2020 1627   RDW 12.7 02/15/2020 1627   LYMPHSABS 955 02/15/2020 1627   MONOABS 0.2 10/15/2019 1345   EOSABS 122 02/15/2020 1627   BASOSABS 10 02/15/2020 1627    No results found for: POCLITH, LITHIUM   No results found for: PHENYTOIN, PHENOBARB, VALPROATE, CBMZ   .res Assessment: Plan:   Patient seen for 30 minutes and time spent discussing possible treatment options.  Discussed potential benefits, risks, and side effects of increasing Prozac to 30 mg daily to possibly improve mood and anxiety signs and symptoms.  Discussed that patient had akathisia in the past with combination of Abilify 2 mg and Prozac 40 mg and it may be worthwhile to start trial of Prozac 30 mg with Abilify 2 mg daily to determine if this combination can be well-tolerated.  Advised patient to decrease dose of Prozac and contact clinic immediately if she experiences any restlessness. Information release obtained for [facility] at time of visit.  Will request results from pharmacogenetics testing from [facility] since this may  be helpful with determining other possible treatment options.  Discussed considering other treatments for augmentation of depression if increase  in Prozac is not effective or well-tolerated, such as augmentation with l-methylfolate. Patient agrees to trial of Prozac 30 mg daily for anxiety depression. Will continue Abilify 2 mg daily for augmentation of depression. Will increase Vyvanse to 20 mg daily to improve concentration and focus. Patient to follow-up in 1 to 2 months or sooner if clinically indicated. Patient advised to contact office with any questions, adverse effects, or acute worsening in signs and symptoms.   Tammy [name] was seen today for depression and anxiety.  Diagnoses and all orders for this visit:  Moderate recurrent major depression (HCC) -     FLUoxetine (PROZAC) 10 MG capsule; Take 3 capsules (30 mg total) by mouth daily. -     ARIPiprazole (ABILIFY) 2 MG tablet; Take 1 tablet (2 mg total) by mouth daily.  Anxiety -     FLUoxetine (PROZAC) 10 MG capsule; Take 3 capsules (30 mg total) by mouth daily.  Attention deficit disorder (ADD) in adult -     lisdexamfetamine (VYVANSE) 20 MG capsule; Take 1 capsule (20 mg total) by mouth daily.     Please see After Visit Summary for patient specific instructions.  Future Appointments  Date Time Provider [place]  02/10/2021  9:00 AM [facility] Headings, PMHNP [facility] None  02/18/2021  8:00 AM [name], MD [facility]    No orders of the defined types were placed in this encounter.   -------------------------------

## 2020-12-25 ENCOUNTER — Other Ambulatory Visit (HOSPITAL_COMMUNITY): Payer: Self-pay

## 2020-12-25 DIAGNOSIS — F332 Major depressive disorder, recurrent severe without psychotic features: Secondary | ICD-10-CM | POA: Diagnosis not present

## 2020-12-25 DIAGNOSIS — F411 Generalized anxiety disorder: Secondary | ICD-10-CM | POA: Diagnosis not present

## 2020-12-26 ENCOUNTER — Ambulatory Visit (HOSPITAL_BASED_OUTPATIENT_CLINIC_OR_DEPARTMENT_OTHER)
Admission: RE | Admit: 2020-12-26 | Discharge: 2020-12-26 | Disposition: A | Discharge status: Home / Self Care | Payer: 59 | Source: Ambulatory Visit | Attending: Family Medicine | Admitting: Family Medicine

## 2020-12-26 ENCOUNTER — Other Ambulatory Visit (HOSPITAL_BASED_OUTPATIENT_CLINIC_OR_DEPARTMENT_OTHER): Payer: Self-pay | Admitting: Family Medicine

## 2020-12-26 ENCOUNTER — Other Ambulatory Visit: Payer: Self-pay

## 2020-12-26 DIAGNOSIS — Z1231 Encounter for screening mammogram for malignant neoplasm of breast: Secondary | ICD-10-CM

## 2020-12-31 ENCOUNTER — Other Ambulatory Visit (HOSPITAL_COMMUNITY): Payer: Self-pay

## 2020-12-31 MED FILL — Pantoprazole Sodium EC Tab 20 MG (Base Equiv): ORAL | 30 days supply | Qty: 30 | Fill #0 | Status: AC

## 2021-01-08 DIAGNOSIS — F332 Major depressive disorder, recurrent severe without psychotic features: Secondary | ICD-10-CM | POA: Diagnosis not present

## 2021-01-08 DIAGNOSIS — F411 Generalized anxiety disorder: Secondary | ICD-10-CM | POA: Diagnosis not present

## 2021-01-09 ENCOUNTER — Other Ambulatory Visit (HOSPITAL_COMMUNITY): Payer: Self-pay

## 2021-01-22 DIAGNOSIS — F332 Major depressive disorder, recurrent severe without psychotic features: Secondary | ICD-10-CM | POA: Diagnosis not present

## 2021-01-22 DIAGNOSIS — F411 Generalized anxiety disorder: Secondary | ICD-10-CM | POA: Diagnosis not present

## 2021-01-28 ENCOUNTER — Other Ambulatory Visit: Payer: Self-pay | Admitting: Family Medicine

## 2021-01-28 ENCOUNTER — Ambulatory Visit: Payer: 59 | Attending: Internal Medicine

## 2021-01-28 ENCOUNTER — Other Ambulatory Visit (HOSPITAL_COMMUNITY): Payer: Self-pay

## 2021-01-28 DIAGNOSIS — Z23 Encounter for immunization: Secondary | ICD-10-CM

## 2021-01-28 MED ORDER — HYDROCHLOROTHIAZIDE 12.5 MG PO TABS
ORAL_TABLET | Freq: Every day | ORAL | 3 refills | Status: DC
  Filled 2021-01-28: qty 90, 90d supply, fill #0

## 2021-01-28 MED FILL — Pantoprazole Sodium EC Tab 20 MG (Base Equiv): ORAL | 30 days supply | Qty: 30 | Fill #1 | Status: AC

## 2021-01-28 NOTE — Progress Notes (Signed)
   Covid-19 Vaccination Clinic  Name:  Tammy [name]    MRN: [id number] DOB: 1960-12-05  01/28/2021  Tammy [name] was observed post Covid-19 immunization for 15 minutes without incident. She was provided with Vaccine Information Sheet and instruction to access the V-Safe system.   Tammy [name] was instructed to call 911 with any severe reactions post vaccine: Marland Kitchen Difficulty breathing  . Swelling of face and throat  . A fast heartbeat  . A bad rash all over body  . Dizziness and weakness   Immunizations Administered    Name Date Dose VIS Date Route   PFIZER Comrnaty(Gray TOP) Covid-19 Vaccine 01/28/2021 11:21 AM 0.3 mL 08/28/2020 Intramuscular   Manufacturer: [employer]   Lot: [id number]   NDC: [phone]

## 2021-01-29 ENCOUNTER — Other Ambulatory Visit (HOSPITAL_COMMUNITY): Payer: Self-pay

## 2021-01-29 DIAGNOSIS — F411 Generalized anxiety disorder: Secondary | ICD-10-CM | POA: Diagnosis not present

## 2021-01-29 DIAGNOSIS — F332 Major depressive disorder, recurrent severe without psychotic features: Secondary | ICD-10-CM | POA: Diagnosis not present

## 2021-02-02 ENCOUNTER — Other Ambulatory Visit (HOSPITAL_BASED_OUTPATIENT_CLINIC_OR_DEPARTMENT_OTHER): Payer: Self-pay

## 2021-02-02 MED ORDER — PFIZER-BIONT COVID-19 VAC-TRIS 30 MCG/0.3ML IM SUSP
INTRAMUSCULAR | 0 refills | Status: DC
  Filled 2021-02-02: qty 0.3, 1d supply, fill #0

## 2021-02-05 DIAGNOSIS — F411 Generalized anxiety disorder: Secondary | ICD-10-CM | POA: Diagnosis not present

## 2021-02-05 DIAGNOSIS — F332 Major depressive disorder, recurrent severe without psychotic features: Secondary | ICD-10-CM | POA: Diagnosis not present

## 2021-02-10 ENCOUNTER — Other Ambulatory Visit: Payer: Self-pay

## 2021-02-10 ENCOUNTER — Ambulatory Visit (INDEPENDENT_AMBULATORY_CARE_PROVIDER_SITE_OTHER): Payer: 59 | Admitting: Psychiatry

## 2021-02-10 ENCOUNTER — Encounter: Payer: Self-pay | Admitting: Psychiatry

## 2021-02-10 ENCOUNTER — Other Ambulatory Visit (HOSPITAL_COMMUNITY): Payer: Self-pay

## 2021-02-10 DIAGNOSIS — F988 Other specified behavioral and emotional disorders with onset usually occurring in childhood and adolescence: Secondary | ICD-10-CM

## 2021-02-10 DIAGNOSIS — F419 Anxiety disorder, unspecified: Secondary | ICD-10-CM

## 2021-02-10 DIAGNOSIS — F331 Major depressive disorder, recurrent, moderate: Secondary | ICD-10-CM

## 2021-02-10 MED ORDER — LISDEXAMFETAMINE DIMESYLATE 20 MG PO CAPS: 20.0000 mg | ORAL_CAPSULE | Freq: Every day | ORAL | 0 refills | Status: DC

## 2021-02-10 MED ORDER — ARIPIPRAZOLE 2 MG PO TABS
2.0000 mg | ORAL_TABLET | Freq: Every day | ORAL | 0 refills | Status: DC
  Filled 2021-02-10 – 2021-04-03 (×3): qty 90, 90d supply, fill #0

## 2021-02-10 MED ORDER — FLUOXETINE HCL 10 MG PO CAPS
30.0000 mg | ORAL_CAPSULE | Freq: Every day | ORAL | 1 refills | Status: DC
  Filled 2021-02-10: qty 90, 30d supply, fill #0
  Filled 2021-04-03: qty 90, 30d supply, fill #1

## 2021-02-10 NOTE — Progress Notes (Signed)
Tammy [name] [id number] Aug 25, 1961 60 y.o.  Subjective:   Patient ID:  Tammy [name] is a 60 y.o. (DOB 1961/03/24) female.  Chief Complaint:  Chief Complaint  Patient presents with  . Depression  . Anxiety    HPI Tammy [name] presents to the office today for follow-up of depression, anxiety, and ADHD. She reports that she increased Vyvanse to 20 mg for several days and then started increased dose of Prozac. She reports that both medication increases seem to be helpful. She reports improved concentration with increase in Vyvanse. She reports that she has had "better days" with increased dose of Prozac. She reports, "my mood has been fine."  She reports that her affect around grieving is blunted. She reports that she has been enjoying working out in the yard. She reports limited socialization. She reports, "I do what I need to do" and energy and motivation have been present for necessary tasks. She reports that anxiety has improved some "an not the horrible, visceral anxiety I have had in the past." She reports rumination has been ok with occasional "thought loops" such as conflict with her sister. She reports some worry. She reports that she no longer has rumination about medication. Denies any recent intrusive thoughts. She reports that her sleep has been ok. Appetite has increased and reports that she has been doing some emotional eating and eating more sugar since going to [place] after her mother's death. Walking daily. Denies SI.   She reports that she was not using Klonopin prn and then needed it prior to thinking she would be traveling to [place] for [employer] before it was cancelled. She reports that she took a 1/4 tablet to improve sleep. She has not used Klonopin prn.   Step-father is now in hospice and [employer] has been delayed as a result.   She will be resuming some in-office visits after working virtually since the start of the pandemic. She may be seeing  patients in person one day a week.   Seeing therapist every 2 weeks.   Past Psychiatric Medication Trials:. Cymbalta Zoloft- Interfered with concentration and thinking was not clear Lexapro- Nocturnal panic attacks.  Prozac- Was on 40 mg of Prozac at the time she was having akathisia with Abilify2 mg. Higher doses seemed to exacerbate akathisia Abilify- helpful. Started on 2 mg daily. Had akathisia and twitching at 5 mg Rexulti- Had very low motivation and fatigue Lamictal- rash Klonopin- Takes as needed. Did not help with rumination.  Ambien Vyvanse- took 20 mg in the past for ADHD. Re-started 10 mg dose. Remeron- Gained 20 lbs.  Gabapentin- Given for neuropathy and caused cloudy thoughts.  Lamotrigine- tingling/burning around mouth and vagina. No rash or blisters.  May have been helpful for mood.  Had [facility] testing that indicated adverse effects to Zoloft, Lexapro, and Paxil.  [place] Office Visit from 07/24/2020 in [place] Visit from 03/31/2020 in [place] Visit from 02/15/2020 in [place]  PHQ-2 Total Score 0 0 0  PHQ-9 Total Score 0 0 0       Review of Systems:  Review of Systems  Musculoskeletal: Negative for gait problem.  Neurological: Negative for tremors.  Psychiatric/Behavioral:       Please refer to HPI    Medications: I have reviewed the patient's current medications.  Current Outpatient Medications  Medication Sig Dispense Refill  . ACETAMINOPHEN-CAFFEINE PO Take by mouth  in the morning.    . clonazePAM (KLONOPIN) 0.5 MG tablet TAKE 1 TABLET BY MOUTH ONCE A DAY AS NEEDED 30 tablet 1  . hydrochlorothiazide (HYDRODIURIL) 12.5 MG tablet TAKE 1 TABLET BY MOUTH ONCE DAILY 90 tablet 3  . MAGNESIUM CHLORIDE-CALCIUM PO Take by mouth.    . Melatonin 1 MG/4ML LIQD Take 5 mg by mouth.    . pantoprazole (PROTONIX) 20 MG tablet TAKE 1 TABLET BY MOUTH DAILY.  30 tablet 5  . Probiotic Product (PROBIOTIC DAILY PO) Take by mouth.    . ARIPiprazole (ABILIFY) 2 MG tablet Take 1 tablet (2 mg total) by mouth daily. 90 tablet 0  . clonazePAM (KLONOPIN) 0.5 MG tablet TAKE 1 TABLET BY MOUTH DAILY AS NEEDED FOR PANIC 31 tablet 0  . clonazePAM (KLONOPIN) 0.5 MG tablet TAKE 1 TABLET BY MOUTH ONCE DAILY AS NEEDED FOR PANIC 30 tablet 0  . COVID-19 mRNA Vac-TriS, Pfizer, (PFIZER-BIONT COVID-19 VAC-TRIS) SUSP injection Inject into the muscle. 0.3 mL 0  . FLUoxetine (PROZAC) 10 MG capsule Take 3 capsules (30 mg total) by mouth daily. 90 capsule 1  . [START ON 03/16/2021] lisdexamfetamine (VYVANSE) 20 MG capsule Take 1 capsule (20 mg total) by mouth daily. (03/16/21) 90 capsule 0  . mupirocin ointment (BACTROBAN) 2 % APPLY TO THE AFFECTED AREA(S) TWO TIMES DAILY AS NEEDED TO OPEN SORES 22 g 3  . triamcinolone ointment (KENALOG) 0.5 % Apply 1 application topically 2 (two) times daily. Try if no improvement with ketoconzole. 30 g 0   No current facility-administered medications for this visit.    Medication Side Effects: None  Allergies:  Allergies  Allergen Reactions  . Eggs Or Egg-Derived Products Other (See Comments)    Makes pt feel like burping up rotten eggs    Past Medical History:  Diagnosis Date  . Arthritis   . Chronic fatigue syndrome   . Diverticulosis   . External hemorrhoids   . Hypertension   . Internal hemorrhoids   . Neuropathy   . Spinal stenosis     Past Medical History, Surgical history, Social history, and Family history were reviewed and updated as appropriate.   Please see review of systems for further details on the patient's review from today.   Objective:   Physical Exam:  LMP 01/26/2011   Physical Exam Constitutional:      General: She is not in acute distress. Musculoskeletal:        General: No deformity.  Neurological:     Mental Status: She is alert and oriented to person, place, and time.     Coordination:  Coordination normal.  Psychiatric:        Attention and Perception: Attention and perception normal. She does not perceive auditory or visual hallucinations.        Mood and Affect: Affect is not labile, blunt, angry or inappropriate.        Speech: Speech normal.        Behavior: Behavior normal.        Thought Content: Thought content normal. Thought content is not paranoid or delusional. Thought content does not include homicidal or suicidal ideation. Thought content does not include homicidal or suicidal plan.        Cognition and Memory: Cognition and memory normal.        Judgment: Judgment normal.     Comments: Insight intact Mood presents as less anxious and less depressed.     Lab Review:     Component Value Date/Time  NA 138 02/15/2020 1627   K 3.8 02/15/2020 1627   CL 103 02/15/2020 1627   CO2 26 02/15/2020 1627   GLUCOSE 87 02/15/2020 1627   BUN 16 02/15/2020 1627   CREATININE 0.83 02/15/2020 1627   CALCIUM 9.4 02/15/2020 1627   PROT 6.6 02/15/2020 1627   ALBUMIN 4.9 10/15/2019 1345   AST 18 02/15/2020 1627   ALT 16 02/15/2020 1627   ALKPHOS 50 10/15/2019 1345   BILITOT 0.3 02/15/2020 1627   GFRNONAA >90 09/18/2012 0525   GFRAA >90 09/18/2012 0525       Component Value Date/Time   WBC 3.4 (L) 02/15/2020 1627   RBC 4.60 02/15/2020 1627   HGB 12.9 02/15/2020 1627   HCT 39.2 02/15/2020 1627   PLT 181 02/15/2020 1627   MCV 85.2 02/15/2020 1627   MCH 28.0 02/15/2020 1627   MCHC 32.9 02/15/2020 1627   RDW 12.7 02/15/2020 1627   LYMPHSABS 955 02/15/2020 1627   MONOABS 0.2 10/15/2019 1345   EOSABS 122 02/15/2020 1627   BASOSABS 10 02/15/2020 1627    No results found for: POCLITH, LITHIUM   No results found for: PHENYTOIN, PHENOBARB, VALPROATE, CBMZ   .res Assessment: Plan:   Patient seen for 45 minutes and time spent reviewing notes from previous psychiatric provider during session and reviewing pharmacokinetic testing results with patient and discussing  implications for treatment.  Patient reports that she would ideally like to reduce Abilify since she is concerned that it may be causing some affective dulling and due to long-term risks associated with atypical antipsychotics.  Recommend continuing current medications for an additional 2 months, since she reports some recent improvement in mood and anxiety signs and symptoms, to have a longer period of of increased stability in signs and symptoms prior to medication reduction.  Discussed that continuing current combination of medications for a longer period of time would also help determine response to dose reduction in Abilify.  Discussed that upcoming memorial for her mother may also affect mood and anxiety and if she experiences increased signs and symptoms it would be difficult to assess if this is due to psychosocial factors or decrease in medication.  Patient agrees to continue current medication doses at this time and will consider dose reduction at next visit. Continue Abilify 2 mg daily for augmentation of depression. Continue Prozac 30 mg daily for anxiety and depression. Continue Vyvanse 20 mg daily for ADHD. Patient to follow-up in 2 months or sooner if clinically indicated. Patient advised to contact office with any questions, adverse effects, or acute worsening in signs and symptoms.   Tammy [name] was seen today for depression and anxiety.  Diagnoses and all orders for this visit:  Moderate recurrent major depression (HCC) -     ARIPiprazole (ABILIFY) 2 MG tablet; Take 1 tablet (2 mg total) by mouth daily. -     FLUoxetine (PROZAC) 10 MG capsule; Take 3 capsules (30 mg total) by mouth daily.  Anxiety -     FLUoxetine (PROZAC) 10 MG capsule; Take 3 capsules (30 mg total) by mouth daily.  Attention deficit disorder (ADD) in adult -     lisdexamfetamine (VYVANSE) 20 MG capsule; Take 1 capsule (20 mg total) by mouth daily. (03/16/21)     Please see After Visit Summary for patient  specific instructions.  Future Appointments  Date Time Provider [place]  02/18/2021  8:00 AM [name], MD [facility]  04/13/2021  8:30 AM Thayer Headings, PMHNP [facility] None  No orders of the defined types were placed in this encounter.   -------------------------------

## 2021-02-18 ENCOUNTER — Other Ambulatory Visit (HOSPITAL_COMMUNITY): Payer: Self-pay

## 2021-02-18 ENCOUNTER — Other Ambulatory Visit: Payer: Self-pay

## 2021-02-18 ENCOUNTER — Encounter: Payer: Self-pay | Admitting: Family Medicine

## 2021-02-18 ENCOUNTER — Ambulatory Visit (INDEPENDENT_AMBULATORY_CARE_PROVIDER_SITE_OTHER): Payer: 59 | Admitting: Family Medicine

## 2021-02-18 VITALS — BP 106/78 | HR 82 | Temp 97.8°F | Resp 16 | Ht 62.0 in | Wt 150.8 lb

## 2021-02-18 DIAGNOSIS — I10 Essential (primary) hypertension: Secondary | ICD-10-CM | POA: Diagnosis not present

## 2021-02-18 DIAGNOSIS — Z0000 Encounter for general adult medical examination without abnormal findings: Secondary | ICD-10-CM

## 2021-02-18 DIAGNOSIS — E785 Hyperlipidemia, unspecified: Secondary | ICD-10-CM

## 2021-02-18 DIAGNOSIS — Z23 Encounter for immunization: Secondary | ICD-10-CM

## 2021-02-18 DIAGNOSIS — Z124 Encounter for screening for malignant neoplasm of cervix: Secondary | ICD-10-CM | POA: Diagnosis not present

## 2021-02-18 DIAGNOSIS — M85851 Other specified disorders of bone density and structure, right thigh: Secondary | ICD-10-CM | POA: Diagnosis not present

## 2021-02-18 LAB — CBC WITH DIFFERENTIAL/PLATELET
Basophils Absolute: 0 10*3/uL (ref 0.0–0.1)
Basophils Relative: 0.6 % (ref 0.0–3.0)
Eosinophils Absolute: 0.1 10*3/uL (ref 0.0–0.7)
Eosinophils Relative: 2.9 % (ref 0.0–5.0)
HCT: 38.2 % (ref 36.0–46.0)
Hemoglobin: 12.9 g/dL (ref 12.0–15.0)
Lymphocytes Relative: 27.8 % (ref 12.0–46.0)
Lymphs Abs: 0.9 10*3/uL (ref 0.7–4.0)
MCHC: 33.9 g/dL (ref 30.0–36.0)
MCV: 81 fl (ref 78.0–100.0)
Monocytes Absolute: 0.3 10*3/uL (ref 0.1–1.0)
Monocytes Relative: 8.9 % (ref 3.0–12.0)
Neutro Abs: 1.8 10*3/uL (ref 1.4–7.7)
Neutrophils Relative %: 59.8 % (ref 43.0–77.0)
Platelets: 208 10*3/uL (ref 150.0–400.0)
RBC: 4.71 Mil/uL (ref 3.87–5.11)
RDW: 13.3 % (ref 11.5–15.5)
WBC: 3.1 10*3/uL — ABNORMAL LOW (ref 4.0–10.5)

## 2021-02-18 LAB — COMPREHENSIVE METABOLIC PANEL
ALT: 17 U/L (ref 0–35)
AST: 22 U/L (ref 0–37)
Albumin: 4.2 g/dL (ref 3.5–5.2)
Alkaline Phosphatase: 53 U/L (ref 39–117)
BUN: 18 mg/dL (ref 6–23)
CO2: 31 mEq/L (ref 19–32)
Calcium: 9.5 mg/dL (ref 8.4–10.5)
Chloride: 101 mEq/L (ref 96–112)
Creatinine, Ser: 0.81 mg/dL (ref 0.40–1.20)
GFR: 78.1 mL/min (ref >60.00)
Glucose, Bld: 102 mg/dL — ABNORMAL HIGH (ref 70–99)
Potassium: 3.3 mEq/L — ABNORMAL LOW (ref 3.5–5.1)
Sodium: 139 mEq/L (ref 135–145)
Total Bilirubin: 0.5 mg/dL (ref 0.2–1.2)
Total Protein: 6.7 g/dL (ref 6.0–8.3)

## 2021-02-18 LAB — LIPID PANEL
Cholesterol: 235 mg/dL — ABNORMAL HIGH (ref 0–200)
HDL: 82.5 mg/dL (ref >39.00)
LDL Cholesterol: 139 mg/dL — ABNORMAL HIGH (ref 0–99)
NonHDL: 152.68
Total CHOL/HDL Ratio: 3
Triglycerides: 66 mg/dL (ref 0.0–149.0)
VLDL: 13.2 mg/dL (ref 0.0–40.0)

## 2021-02-18 LAB — POC URINALSYSI DIPSTICK (AUTOMATED)
Bilirubin, UA: NEGATIVE
Blood, UA: NEGATIVE
Glucose, UA: NEGATIVE
Ketones, UA: NEGATIVE
Leukocytes, UA: NEGATIVE
Nitrite, UA: NEGATIVE
Protein, UA: NEGATIVE
Spec Grav, UA: 1.015 (ref 1.010–1.025)
Urobilinogen, UA: 0.2 E.U./dL
pH, UA: 8 (ref 5.0–8.0)

## 2021-02-18 MED ORDER — HYDROCHLOROTHIAZIDE 25 MG PO TABS
25.0000 mg | ORAL_TABLET | Freq: Every day | ORAL | 3 refills | Status: DC
  Filled 2021-02-18: qty 90, 90d supply, fill #0
  Filled 2021-05-15: qty 90, 90d supply, fill #1
  Filled 2021-07-30: qty 90, 90d supply, fill #2
  Filled 2021-10-29: qty 90, 90d supply, fill #3

## 2021-02-18 NOTE — Addendum Note (Signed)
Addended by: Doran Clay A on: 02/18/2021 08:59 AM   Modules accepted: Orders

## 2021-02-18 NOTE — Addendum Note (Signed)
Addended by: Jacob Moores on: 02/18/2021 08:57 AM   Modules accepted: Orders

## 2021-02-18 NOTE — Progress Notes (Signed)
Phone [phone]   Subjective:  Patient presents today for their annual physical. Chief complaint-noted.   See problem oriented charting- ROS- full  review of systems was completed and negative from review of systems sheet  The following were reviewed and entered/updated in epic: Past Medical History:  Diagnosis Date  . Arthritis   . Chronic fatigue syndrome   . Diverticulosis   . External hemorrhoids   . Hypertension   . Internal hemorrhoids   . Neuropathy   . Spinal stenosis    Patient Active Problem List   Diagnosis Date Noted  . Anxiety 07/24/2020  . Nonallopathic lesion of cervical region 04/03/2020  . Nonallopathic lesion of thoracic region 04/03/2020  . Nonallopathic lesion of lumbar region 04/03/2020  . Cervical stenosis of spine 03/31/2020  . Attention deficit disorder (ADD) in adult 03/31/2020  . Low bone density 03/17/2020  . 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[place]) 02/15/2020  . GERD (gastroesophageal reflux disease) 02/15/2020  . Allergic rhinitis 03/09/2016  . Essential hypertension 02/18/2014  . Chronic pain syndrome 02/22/2013  . Chronic insomnia 02/22/2013  . Paresthesias 10/25/2012  . Hyperlipidemia 09/18/2012   Past Surgical History:  Procedure Laterality Date  . CARDIAC CATHETERIZATION  09/18/2012  . LEFT HEART CATHETERIZATION WITH CORONARY ANGIOGRAM N/A 09/18/2012   Procedure: LEFT HEART CATHETERIZATION WITH CORONARY ANGIOGRAM;  Surgeon: Sherren Mocha, MD;  Location: [facility] CATH LAB;  Service: Cardiovascular;  Laterality: N/A;  . TONSILLECTOMY  1965    Family History  Problem Relation Age of Onset  . Heart attack Maternal Grandmother 50  . Arthritis Maternal Grandmother   . Hyperlipidemia Maternal Grandmother   . Heart disease Maternal Grandmother   . Diabetes Maternal Grandmother   . Pancreatic cancer Mother        died 107. 2.5 years past diagnosis.    . Anxiety disorder Mother   . Depression Mother   . Colon polyps Father    . Healthy Sister   . Healthy Brother   . Depression Brother   . Heart attack Brother        age 45  . Suicidality Maternal Grandfather   . Colon cancer Neg Hx   . Esophageal cancer Neg Hx   . Kidney disease Neg Hx     Medications- reviewed and updated Current Outpatient Medications  Medication Sig Dispense Refill  . ACETAMINOPHEN-CAFFEINE PO Take by mouth in the morning.    . ARIPiprazole (ABILIFY) 2 MG tablet Take 1 tablet (2 mg total) by mouth daily. 90 tablet 0  . clonazePAM (KLONOPIN) 0.5 MG tablet TAKE 1 TABLET BY MOUTH ONCE A DAY AS NEEDED 30 tablet 1  . COVID-19 mRNA Vac-TriS, Pfizer, (PFIZER-BIONT COVID-19 VAC-TRIS) SUSP injection Inject into the muscle. 0.3 mL 0  . FLUoxetine (PROZAC) 10 MG capsule Take 3 capsules (30 mg total) by mouth daily. 90 capsule 1  . hydrochlorothiazide (HYDRODIURIL) 25 MG tablet Take 1 tablet (25 mg total) by mouth daily. 90 tablet 3  . [START ON 03/16/2021] lisdexamfetamine (VYVANSE) 20 MG capsule Take 1 capsule (20 mg total) by mouth daily. (03/16/21) 90 capsule 0  . MAGNESIUM CHLORIDE-CALCIUM PO Take by mouth.    . Melatonin 1 MG/4ML LIQD Take 5 mg by mouth.    . pantoprazole (PROTONIX) 20 MG tablet TAKE 1 TABLET BY MOUTH DAILY. 30 tablet 5  . Probiotic Product (PROBIOTIC DAILY PO) Take by mouth.    . clonazePAM (KLONOPIN) 0.5 MG tablet TAKE 1 TABLET BY MOUTH DAILY AS NEEDED FOR PANIC  31 tablet 0  . clonazePAM (KLONOPIN) 0.5 MG tablet TAKE 1 TABLET BY MOUTH ONCE DAILY AS NEEDED FOR PANIC 30 tablet 0   No current facility-administered medications for this visit.    Allergies-reviewed and updated Allergies  Allergen Reactions  . Eggs Or Egg-Derived Products Other (See Comments)    Makes pt feel like burping up rotten eggs    Social History   Social History Narrative   Married. 2 adopted children- adopted daughter (17) when single after first marriage from [place]. Son from [place] with 2nd husband(18) in 2021. 1st husband local  physician Linus Mako      LCSW with [employer]      Hobbies: animal rescue- has litter of kittens in 2021, walking, time outside   Objective  Objective:  BP 106/78   Pulse 82   Temp 97.8 F (36.6 C) (Temporal)   Resp 16   Ht 5\' 2"  (1.575 m)   Wt 150 lb 12.8 oz (68.4 kg)   LMP 01/26/2011   SpO2 98%   BMI 27.58 kg/m  Gen: NAD, resting comfortably HEENT: Mucous membranes are moist. Oropharynx normal. TMs and canals normal, turbinates normal Neck: no thyromegaly, no carotid bruit, no lymphadenopathy CV: RRR no murmurs rubs or gallops, +2 DT & PT pulses bilaterally Lungs: CTAB no crackles, wheeze, rhonchi Abdomen: soft/nontender/nondistended/normal bowel sounds. No rebound or guarding.  Ext: no edema Skin: warm, dry Neuro: grossly normal, moves all extremities, PERRLA   Assessment and Plan   60 y.o. female presenting for annual physical.  Health Maintenance counseling: 1. Anticipatory guidance: Patient counseled regarding regular dental exams-q6 months, eye exams-yearly,  avoiding smoking and second hand smoke , limiting alcohol to 1 beverage per day-doesn't drink .   2. Risk factor reduction:  Advised patient of need for regular exercise and diet rich and fruits and vegetables to reduce risk of heart attack and stroke. Exercise- Continues to walk 45 minutes daily. Diet-fairly healthy, she continues to decrease her sugar intake. She has been eating a lot of chocolate covered peanuts and cheese which she plans to cut down. Abilify has contributed to weight gain. Was 149 at last physical Wt Readings from Last 3 Encounters:  02/18/21 150 lb 12.8 oz (68.4 kg)  07/24/20 157 lb 6.4 oz (71.4 kg)  05/03/20 148 lb (67.1 kg)  3. Immunizations/screenings/ancillary studies: Shingrix today, repeat injection in 4-5 months, up-to-date on COVID-19 vaccination Immunization History  Administered Date(s) Administered  . Influenza,inj,Quad PF,6+ Mos 06/19/2013  . Influenza-Unspecified 06/29/2020   . PFIZER Comirnaty(Gray Top)Covid-19 Tri-Sucrose Vaccine 01/28/2021  . PFIZER(Purple Top)SARS-COV-2 Vaccination 09/25/2019, 10/16/2019  . Tdap 08/22/2014   4. Cervical cancer screening-we never received records from 2019-due for repeat at this time-ultimately opted for referral to gynecologist 5. Breast cancer screening-  breast exam planned with GYN-referred today-and mammogram-12/26/20, repeat in 1 year  6. Colon cancer screening - 10/31/2018 with 10 year repeat 7. Skin cancer screening- Seen [facility] recently-they advised every 10-year repeat. advised regular sunscreen use. Denies worrisome, changing, or new skin lesions.  8. Birth control/STD check-postmenopausal, monomous 9. 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 Mother unfortunately passed away from pancreatic cancer in her 69s recently.  Also lost stepfather several months later  #hypertension S: medication: Hydrochlorothiazide 12.5 mg, she is taking 2 tablet instead of 1 daily secondary  to developing LE edema.  Home readings #s: Not taking at home BP Readings from Last 3 Encounters:  02/18/21 106/78  07/24/20 124/80  04/01/20 102/62  A/P: Blood pressure is well controlled perhaps slightly overcontrolled.  I did refill hydrochlorothiazide at 25 mg but if blood pressure is controlled and you are not swelling can certainly take just 1/2 tablet   #hyperlipidemia-10-year ASCVD risk has been below 4.6% S: Medication:None -Small vessel disease was noted on MRI of the brain in the past but patient has wanted to work on lifestyle changes Lab Results  Component Value Date   CHOL 233 (H) 02/15/2020   HDL 87 02/15/2020   [place] 130 (H) 02/15/2020   TRIG 68 02/15/2020   CHOLHDL 2.7 02/15/2020  A/P: Mild poor  control-ASCVD risk is not elevated as patient would like to work on diet and exercise instead of starting medication even with small vessel disease   % # Depression/anxiety/ADD-now managed by psychiatry Thayer Headings, NP S: Medication:Fluoxetine 10 mg -3 tablets daily for depression and anxiety, Abilify 2 mg for depression, Vyvanse 20 mg for ADD, complaint -Also on clonazepam as needed for anxiety -Also on melatonin for sleep A/P:Well controlled-continue close follow-up with psychiatry- thrilled she is using clonazepam sparingly-last refill was last year-refill can be provided for me if needed but likely will be from psychiatry -She may try to reduce Abilify with psychiatry but wants to wait until situation stabilizes after loss of her mother and stepfather within several months  # GERD S:Medication: Protonix 20 mg, compliant-denies having any recent GI episodes. A/P: For acid reflux best to get on lowest effective dose of Protonix-can try every other day and if that is effective continue at that dose- on the days that she do not have Protonix could try Pepcid over-the-counter if you are having breakthrough symptoms.  If you have issues with the lower dose simply stick with the Protonix 20 mg   Recommended follow up: Return in about 1 year (around 02/18/2022) for physical or sooner if needed. Future Appointments  Date Time Provider [place]  04/13/2021  8:30 AM Thayer Headings, PMHNP [facility] None    Lab/Order associations: fasting   ICD-10-CM   1. Osteopenia of neck of right femur  M85.851 DG Bone Density  2. Hyperlipidemia, unspecified hyperlipidemia type  E78.5   3. Preventative health care  Z00.00     Meds ordered this encounter  Medications  . hydrochlorothiazide (HYDRODIURIL) 25 MG tablet    Sig: Take 1 tablet (25 mg total) by mouth daily.    Dispense:  90 tablet    Refill:  3    I,Alexis Bryant,acting as a scribe for Garret Reddish, MD.,have documented all relevant  documentation on the behalf of Garret Reddish, MD,as directed by  Garret Reddish, MD while in the presence of Garret Reddish, MD.   I, Garret Reddish, MD, have reviewed all documentation for this visit. The documentation on 02/18/21 for the exam, diagnosis, procedures, and orders are all accurate and complete.   Return precautions advised.  Lucille Passy

## 2021-02-18 NOTE — Patient Instructions (Addendum)
Health Maintenance Due  Topic Date Due  . Zoster Vaccines- Shingrix (1 of 2)- Shingrix #1 today. Repeat injection in 2-5 months. Schedule a nurse visit for the 2nd injection before you leave today (at the check out desk)  Never done   Team please add 1st booster from November 2021 please for covid   Blood pressure is well controlled perhaps slightly overcontrolled.  I did refill hydrochlorothiazide at 25 mg but if blood pressure is controlled and you are not swelling can certainly take just 1/2 tablet  For acid reflux best to get on lowest effective dose of Protonix-can try every other day and if that is effective continue at that dose- on the days that she do not have Protonix could try Pepcid over-the-counter if you are having breakthrough symptoms.  If you have issues with the lower dose simply stick with the Protonix 20 mg  Please stop by lab before you go If you have mychart- we will send your results within 3 business days of [place] receiving them.  If you do not have mychart- we will call you about results within 5 business days of [place] receiving them.  *please also note that you will see labs on mychart as soon as they post. I will later go in and write notes on them- will say "notes from Dr. Yong Channel"  We will call you within two weeks about your referral to [facility] Dr. Helane Rima. If you do not hear within 2 weeks, give [place] a call.     Recommended follow up: Return in about 1 year (around 02/18/2022) for physical or sooner if needed.

## 2021-02-19 DIAGNOSIS — F332 Major depressive disorder, recurrent severe without psychotic features: Secondary | ICD-10-CM | POA: Diagnosis not present

## 2021-02-19 DIAGNOSIS — F411 Generalized anxiety disorder: Secondary | ICD-10-CM | POA: Diagnosis not present

## 2021-02-28 ENCOUNTER — Other Ambulatory Visit (HOSPITAL_COMMUNITY): Payer: Self-pay

## 2021-02-28 MED FILL — Pantoprazole Sodium EC Tab 20 MG (Base Equiv): ORAL | 30 days supply | Qty: 30 | Fill #2 | Status: AC

## 2021-03-04 ENCOUNTER — Other Ambulatory Visit (HOSPITAL_COMMUNITY): Payer: Self-pay

## 2021-03-05 DIAGNOSIS — F411 Generalized anxiety disorder: Secondary | ICD-10-CM | POA: Diagnosis not present

## 2021-03-05 DIAGNOSIS — F332 Major depressive disorder, recurrent severe without psychotic features: Secondary | ICD-10-CM | POA: Diagnosis not present

## 2021-03-12 DIAGNOSIS — F411 Generalized anxiety disorder: Secondary | ICD-10-CM | POA: Diagnosis not present

## 2021-03-12 DIAGNOSIS — F332 Major depressive disorder, recurrent severe without psychotic features: Secondary | ICD-10-CM | POA: Diagnosis not present

## 2021-03-24 DIAGNOSIS — F332 Major depressive disorder, recurrent severe without psychotic features: Secondary | ICD-10-CM | POA: Diagnosis not present

## 2021-03-24 DIAGNOSIS — F411 Generalized anxiety disorder: Secondary | ICD-10-CM | POA: Diagnosis not present

## 2021-04-02 DIAGNOSIS — F332 Major depressive disorder, recurrent severe without psychotic features: Secondary | ICD-10-CM | POA: Diagnosis not present

## 2021-04-02 DIAGNOSIS — F411 Generalized anxiety disorder: Secondary | ICD-10-CM | POA: Diagnosis not present

## 2021-04-03 ENCOUNTER — Other Ambulatory Visit (HOSPITAL_COMMUNITY): Payer: Self-pay

## 2021-04-03 MED FILL — Pantoprazole Sodium EC Tab 20 MG (Base Equiv): ORAL | 30 days supply | Qty: 30 | Fill #3 | Status: AC

## 2021-04-06 ENCOUNTER — Other Ambulatory Visit (HOSPITAL_COMMUNITY): Payer: Self-pay

## 2021-04-07 ENCOUNTER — Other Ambulatory Visit (HOSPITAL_COMMUNITY): Payer: Self-pay

## 2021-04-13 ENCOUNTER — Ambulatory Visit: Payer: 59 | Admitting: Psychiatry

## 2021-04-16 DIAGNOSIS — F411 Generalized anxiety disorder: Secondary | ICD-10-CM | POA: Diagnosis not present

## 2021-04-16 DIAGNOSIS — F332 Major depressive disorder, recurrent severe without psychotic features: Secondary | ICD-10-CM | POA: Diagnosis not present

## 2021-04-21 ENCOUNTER — Other Ambulatory Visit: Payer: Self-pay

## 2021-04-21 ENCOUNTER — Other Ambulatory Visit (HOSPITAL_COMMUNITY): Payer: Self-pay

## 2021-04-21 ENCOUNTER — Encounter: Payer: Self-pay | Admitting: Psychiatry

## 2021-04-21 ENCOUNTER — Ambulatory Visit (INDEPENDENT_AMBULATORY_CARE_PROVIDER_SITE_OTHER): Payer: 59 | Admitting: Psychiatry

## 2021-04-21 DIAGNOSIS — F988 Other specified behavioral and emotional disorders with onset usually occurring in childhood and adolescence: Secondary | ICD-10-CM

## 2021-04-21 DIAGNOSIS — F331 Major depressive disorder, recurrent, moderate: Secondary | ICD-10-CM

## 2021-04-21 DIAGNOSIS — F419 Anxiety disorder, unspecified: Secondary | ICD-10-CM | POA: Diagnosis not present

## 2021-04-21 MED ORDER — FLUOXETINE HCL 10 MG PO CAPS
30.0000 mg | ORAL_CAPSULE | Freq: Every day | ORAL | 1 refills | Status: DC
  Filled 2021-04-21 – 2021-04-28 (×2): qty 90, 30d supply, fill #0
  Filled 2021-05-15 – 2021-05-22 (×2): qty 90, 30d supply, fill #1

## 2021-04-21 MED ORDER — ARIPIPRAZOLE 2 MG PO TABS
1.0000 mg | ORAL_TABLET | Freq: Every day | ORAL | 0 refills | Status: DC
  Filled 2021-04-21: qty 45, 90d supply, fill #0

## 2021-04-21 MED ORDER — CLONAZEPAM 0.5 MG PO TABS
0.5000 mg | ORAL_TABLET | Freq: Every day | ORAL | 1 refills | Status: DC | PRN
  Filled 2021-04-21 – 2021-10-14 (×2): qty 30, 30d supply, fill #0

## 2021-04-21 MED ORDER — LISDEXAMFETAMINE DIMESYLATE 20 MG PO CAPS
20.0000 mg | ORAL_CAPSULE | Freq: Every day | ORAL | 0 refills | Status: DC
  Filled 2021-04-28: qty 90, 90d supply, fill #0

## 2021-04-21 NOTE — Progress Notes (Signed)
ARITHA MOSTYN [id number] 04-08-1961 60 y.o.  Subjective:   Patient ID:  Tammy [name] is a 60 y.o. (DOB May 01, 1961) female.  Chief Complaint:  Chief Complaint  Patient presents with   Follow-up    Depression and anxiety    HPI Tammy [name] presents to the office today for follow-up of depression, anxiety, and ADHD. Traveled to [place] for [employer]. Tammy [name] reports "I've been feeling ok." Tammy [name] would like to taper off. Tammy [name] reports that her depression has been "fine. I haven't felt depressed." Tammy [name] reports that Tammy [name] has continued to feel "flat." Tammy [name] reports that her husband has commented that Tammy [name] seems "bubbly" and "lights up" around people. Tammy [name] reports that her energy and motivation have improved. Tammy [name] reports that her appetite has been good. Tammy [name] reports that her concentration has been good. Tammy [name] reports that her anxiety has been ok. Tammy [name] had some anticipatory anxiety about trip to [place] and this went ok. Denies any recent rumination or intrusive thoughts. Sleeping well. Denies SI.   Had a beach vacation last week and was with daughter and daughter's partner. Had dinner with friend for her birthday. Tammy [name] has been enjoying interactions with others.   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 Works with [employer]. Reports that a neighbor started coming by to interact with the kitchens.   Tammy [name] is going into the office one day a week and otherwise is working virtually. 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    Tammy [name] took Klonopin prn for flying and has not taken it otherwise.   Past Psychiatric Medication Trials:. Cymbalta Zoloft- Interfered with concentration and thinking was not clear Lexapro- Nocturnal panic attacks.   Prozac- Was on 40 mg of Prozac at the time Tammy [name] was having akathisia with Abilify 2 mg. Higher doses seemed to exacerbate akathisia Abilify- helpful. Started on 2 mg daily. Had akathisia and twitching at 5 mg Rexulti- Had very low  motivation and fatigue Lamictal- rash Klonopin- Takes as needed. Did not help with rumination. Ambien Vyvanse- took 20 mg in the past for ADHD. Re-started 10 mg dose. Remeron- Gained 20 lbs. Gabapentin- Given for neuropathy and caused cloudy thoughts.  Lamotrigine- tingling/burning around mouth and vagina. No rash or blisters.  May have been helpful for mood.   Had Genesight testing that indicated adverse effects to Zoloft, Lexapro, and Paxil.   GAD-7    Flowsheet Row Office Visit from 02/18/2021 in [place]  Total GAD-7 Score [place] Office Visit from 02/18/2021 in [place] Visit from 07/24/2020 in [place] Visit from 03/31/2020 in [place] Visit from 02/15/2020 in [place]  PHQ-2 Total Score 0 0 0 0  PHQ-9 Total Score -- 0 0 0        Review of Systems:  Review of Systems  Musculoskeletal:  Negative for gait problem.  Neurological:  Negative for tremors and headaches.  Psychiatric/Behavioral:         Please refer to HPI   Medications: I have reviewed the patient's current medications.  Current Outpatient Medications  Medication Sig Dispense Refill   ACETAMINOPHEN-CAFFEINE PO Take by mouth in the morning.     hydrochlorothiazide (HYDRODIURIL) 25 MG tablet Take 1 tablet (25 mg total) by mouth daily. 90 tablet 3   MAGNESIUM CHLORIDE-CALCIUM PO Take by mouth.  Melatonin 1 MG/4ML LIQD Take 5 mg by mouth.     pantoprazole (PROTONIX) 20 MG tablet TAKE 1 TABLET BY MOUTH DAILY. 30 tablet 5   Probiotic Product (PROBIOTIC DAILY PO) Take by mouth.     ARIPiprazole (ABILIFY) 2 MG tablet Take 0.5 tablets (1 mg total) by mouth daily. 45 tablet 0   clonazePAM (KLONOPIN) 0.5 MG tablet Take 1 tablet (0.5 mg total) by mouth daily as needed. 30 tablet 1   COVID-19 mRNA Vac-TriS, Pfizer, (PFIZER-BIONT COVID-19  VAC-TRIS) SUSP injection Inject into the muscle. 0.3 mL 0   FLUoxetine (PROZAC) 10 MG capsule Take 3 capsules (30 mg total) by mouth daily. 90 capsule 1   lisdexamfetamine (VYVANSE) 20 MG capsule Take 1 capsule (20 mg total) by mouth daily. (03/16/21) 90 capsule 0   No current facility-administered medications for this visit.    Medication Side Effects: Other: Some affective dulling  Allergies:  Allergies  Allergen Reactions   Eggs Or Egg-Derived Products Other (See Comments)    Makes pt feel like burping up rotten eggs    Past Medical History:  Diagnosis Date   Arthritis    Chronic fatigue syndrome    Diverticulosis    External hemorrhoids    Hypertension    Internal hemorrhoids    Neuropathy    Spinal stenosis     Past Medical History, Surgical history, Social history, and Family history were reviewed and updated as appropriate.   Please see review of systems for further details on the patient's review from today.   Objective:   Physical Exam:  BP 129/77   Pulse 86   LMP 01/26/2011   Physical Exam Constitutional:      General: Tammy [name] is not in acute distress. Musculoskeletal:        General: No deformity.  Neurological:     Mental Status: Tammy [name] is alert and oriented to person, place, and time.     Coordination: Coordination normal.  Psychiatric:        Attention and Perception: Attention and perception normal. Tammy [name] does not perceive auditory or visual hallucinations.        Mood and Affect: Mood normal. Mood is not anxious or depressed. Affect is not labile, blunt, angry or inappropriate.        Speech: Speech normal.        Behavior: Behavior normal.        Thought Content: Thought content normal. Thought content is not paranoid or delusional. Thought content does not include homicidal or suicidal ideation. Thought content does not include homicidal or suicidal plan.        Cognition and Memory: Cognition and memory normal.        Judgment: Judgment normal.      Comments: Insight intact    Lab Review:     Component Value Date/Time   NA 139 02/18/2021 0856   K 3.3 (L) 02/18/2021 0856   CL 101 02/18/2021 0856   CO2 31 02/18/2021 0856   GLUCOSE 102 (H) 02/18/2021 0856   BUN 18 02/18/2021 0856   CREATININE 0.81 02/18/2021 0856   CREATININE 0.83 02/15/2020 1627   CALCIUM 9.5 02/18/2021 0856   PROT 6.7 02/18/2021 0856   ALBUMIN 4.2 02/18/2021 0856   AST 22 02/18/2021 0856   ALT 17 02/18/2021 0856   ALKPHOS 53 02/18/2021 0856   BILITOT 0.5 02/18/2021 0856   GFRNONAA >90 09/18/2012 0525   GFRAA >90 09/18/2012 0525       Component Value Date/Time  WBC 3.1 (L) 02/18/2021 0856   RBC 4.71 02/18/2021 0856   HGB 12.9 02/18/2021 0856   HCT 38.2 02/18/2021 0856   PLT 208.0 02/18/2021 0856   MCV 81.0 02/18/2021 0856   MCH 28.0 02/15/2020 1627   MCHC 33.9 02/18/2021 0856   RDW 13.3 02/18/2021 0856   LYMPHSABS 0.9 02/18/2021 0856   MONOABS 0.3 02/18/2021 0856   EOSABS 0.1 02/18/2021 0856   BASOSABS 0.0 02/18/2021 0856    No results found for: POCLITH, LITHIUM   No results found for: PHENYTOIN, PHENOBARB, VALPROATE, CBMZ   .res Assessment: Plan:   Pt seen for 30 minutes and time spent discussing treatment plan and pt's request to decrease Ability. Recommend decreasing Abilify to 2 mg every other or 2 mg 1/2 tablet daily to determine if mood and anxiety s/s remain stable with decreased dose prior to stopping Abilify. Advised pt to contact office if Tammy [name] experiences worsening depressive s/s after dose decrease.  Continue Prozac 30 mg po qd for anxiety and depression.  Continue Klonopin 0.5 mg po qd prn anxiety. Continue Vyvanse 20 mg po qd for ADHD.  Pt to follow-up in 6-8 weeks or sooner if clinically indicated.  Patient advised to contact office with any questions, adverse effects, or acute worsening in signs and symptoms.    Tammy [name] was seen today for follow-up.  Diagnoses and all orders for this visit:  Attention deficit disorder  (ADD) in adult -     lisdexamfetamine (VYVANSE) 20 MG capsule; Take 1 capsule (20 mg total) by mouth daily. (03/16/21)  Moderate recurrent major depression (HCC) -     ARIPiprazole (ABILIFY) 2 MG tablet; Take 0.5 tablets (1 mg total) by mouth daily. -     FLUoxetine (PROZAC) 10 MG capsule; Take 3 capsules (30 mg total) by mouth daily.  Anxiety -     clonazePAM (KLONOPIN) 0.5 MG tablet; Take 1 tablet (0.5 mg total) by mouth daily as needed. -     FLUoxetine (PROZAC) 10 MG capsule; Take 3 capsules (30 mg total) by mouth daily.    Please see After Visit Summary for patient specific instructions.  Future Appointments  Date Time Provider [place]  06/09/2021  1:30 PM Thayer Headings, PMHNP [facility] None  06/23/2021  9:00 AM [facility] NURSE [facility]  02/19/2022  8:00 AM [name], MD [facility]    No orders of the defined types were placed in this encounter.   -------------------------------

## 2021-04-22 ENCOUNTER — Ambulatory Visit: Payer: 59 | Admitting: Psychiatry

## 2021-04-28 ENCOUNTER — Other Ambulatory Visit (HOSPITAL_COMMUNITY): Payer: Self-pay

## 2021-04-28 MED FILL — Pantoprazole Sodium EC Tab 20 MG (Base Equiv): ORAL | 30 days supply | Qty: 30 | Fill #4 | Status: AC

## 2021-04-30 DIAGNOSIS — F411 Generalized anxiety disorder: Secondary | ICD-10-CM | POA: Diagnosis not present

## 2021-04-30 DIAGNOSIS — F332 Major depressive disorder, recurrent severe without psychotic features: Secondary | ICD-10-CM | POA: Diagnosis not present

## 2021-05-01 DIAGNOSIS — H5213 Myopia, bilateral: Secondary | ICD-10-CM | POA: Diagnosis not present

## 2021-05-12 ENCOUNTER — Other Ambulatory Visit (HOSPITAL_COMMUNITY): Payer: Self-pay

## 2021-05-12 MED ORDER — AMOXICILLIN 500 MG PO CAPS
500.0000 mg | ORAL_CAPSULE | Freq: Three times a day (TID) | ORAL | 0 refills | Status: DC
  Filled 2021-05-12: qty 21, 7d supply, fill #0

## 2021-05-14 DIAGNOSIS — F411 Generalized anxiety disorder: Secondary | ICD-10-CM | POA: Diagnosis not present

## 2021-05-14 DIAGNOSIS — F332 Major depressive disorder, recurrent severe without psychotic features: Secondary | ICD-10-CM | POA: Diagnosis not present

## 2021-05-15 ENCOUNTER — Other Ambulatory Visit (HOSPITAL_COMMUNITY): Payer: Self-pay

## 2021-05-22 ENCOUNTER — Other Ambulatory Visit (HOSPITAL_COMMUNITY): Payer: Self-pay

## 2021-05-22 DIAGNOSIS — F411 Generalized anxiety disorder: Secondary | ICD-10-CM | POA: Diagnosis not present

## 2021-05-22 DIAGNOSIS — F332 Major depressive disorder, recurrent severe without psychotic features: Secondary | ICD-10-CM | POA: Diagnosis not present

## 2021-06-01 ENCOUNTER — Other Ambulatory Visit (HOSPITAL_COMMUNITY): Payer: Self-pay

## 2021-06-01 ENCOUNTER — Other Ambulatory Visit: Payer: Self-pay | Admitting: Family Medicine

## 2021-06-01 MED ORDER — PANTOPRAZOLE SODIUM 20 MG PO TBEC
DELAYED_RELEASE_TABLET | Freq: Every day | ORAL | 5 refills | Status: DC
Start: 2021-06-01 — End: 2021-11-27
  Filled 2021-06-01: qty 30, 30d supply, fill #0
  Filled 2021-06-30: qty 30, 30d supply, fill #1
  Filled 2021-07-30: qty 30, 30d supply, fill #2
  Filled 2021-08-30: qty 30, 30d supply, fill #3
  Filled 2021-09-29: qty 30, 30d supply, fill #4
  Filled 2021-10-29: qty 30, 30d supply, fill #5

## 2021-06-08 DIAGNOSIS — Z01419 Encounter for gynecological examination (general) (routine) without abnormal findings: Secondary | ICD-10-CM | POA: Diagnosis not present

## 2021-06-08 DIAGNOSIS — Z6828 Body mass index (BMI) 28.0-28.9, adult: Secondary | ICD-10-CM | POA: Diagnosis not present

## 2021-06-08 DIAGNOSIS — Z13820 Encounter for screening for osteoporosis: Secondary | ICD-10-CM | POA: Diagnosis not present

## 2021-06-09 ENCOUNTER — Ambulatory Visit (INDEPENDENT_AMBULATORY_CARE_PROVIDER_SITE_OTHER): Payer: 59 | Admitting: Psychiatry

## 2021-06-09 ENCOUNTER — Other Ambulatory Visit (HOSPITAL_COMMUNITY): Payer: Self-pay

## 2021-06-09 ENCOUNTER — Other Ambulatory Visit: Payer: Self-pay

## 2021-06-09 ENCOUNTER — Encounter: Payer: Self-pay | Admitting: Psychiatry

## 2021-06-09 DIAGNOSIS — F419 Anxiety disorder, unspecified: Secondary | ICD-10-CM

## 2021-06-09 DIAGNOSIS — F331 Major depressive disorder, recurrent, moderate: Secondary | ICD-10-CM

## 2021-06-09 DIAGNOSIS — F988 Other specified behavioral and emotional disorders with onset usually occurring in childhood and adolescence: Secondary | ICD-10-CM

## 2021-06-09 MED ORDER — LISDEXAMFETAMINE DIMESYLATE 20 MG PO CAPS
20.0000 mg | ORAL_CAPSULE | Freq: Every day | ORAL | 0 refills | Status: DC
  Filled 2021-07-30: qty 90, 90d supply, fill #0

## 2021-06-09 MED ORDER — FLUOXETINE HCL 10 MG PO CAPS
30.0000 mg | ORAL_CAPSULE | Freq: Every day | ORAL | 1 refills | Status: DC
  Filled 2021-06-09 – 2021-07-17 (×2): qty 90, 30d supply, fill #0

## 2021-06-09 NOTE — Progress Notes (Signed)
Tammy [name] [id number] 1961/04/06 60 y.o.  Subjective:   Patient ID:  Tammy [name] is a 60 y.o. (DOB November 28, 1960) female.  Chief Complaint:  Chief Complaint  Patient presents with   Follow-up    Depression and anxiety    HPI Tammy [name] presents to the office today for follow-up of depression and anxiety. She has been taking Abilify 1 mg po qd. She reports that it has been "going well." She reports that for about 2.5-3 weeks after decrease in Abilify and then it "levels out." She reports that anxiety has returned to baseline. Denies intrusive thoughts. Mild rumination in response to some family stressors. She reports that her anxiety is manageable. Denies depressed mood. Sleeping well. Appetite has been good. She reports improved motivation and has been more productive. She went through some of her things and gave some things away. Has completed some self-study CEU's. Energy is good. Denies excessive energy. She reports that her concentration has been adequate. Denies SI.   Continues to foster kittens and has had one kitten that needed bottle feeding. She has been enjoying the kittens.   Continues to mostly work from home. Goes to the office one day a week. Will transition to [employer] in December.   Took Klonopin x 1 prior to dental procedure.   Past Psychiatric Medication Trials:. Cymbalta Zoloft- Interfered with concentration and thinking was not clear Lexapro- Nocturnal panic attacks.   Prozac- Was on 40 mg of Prozac at the time she was having akathisia with Abilify 2 mg. Higher doses seemed to exacerbate akathisia Abilify- helpful. Started on 2 mg daily. Had akathisia and twitching at 5 mg Rexulti- Had very low motivation and fatigue Lamictal- rash Klonopin- Takes as needed. Did not help with rumination. Ambien Vyvanse- took 20 mg in the past for ADHD. Re-started 10 mg dose. Remeron- Gained 20 lbs. Gabapentin- Given for neuropathy and caused cloudy thoughts.  Lamotrigine-  tingling/burning around mouth and vagina. No rash or blisters.  May have been helpful for mood.   Had Genesight testing that indicated adverse effects to Zoloft, Lexapro, and Paxil.  [place] Office Visit from 06/09/2021 in [place] Total Score 0      GAD-7    Flowsheet Row Office Visit from 02/18/2021 in [place]  Total GAD-7 Score 0      PHQ2-9    [place] Office Visit from 02/18/2021 in [place] Visit from 07/24/2020 in [place] Visit from 03/31/2020 in [place] Visit from 02/15/2020 in [place]  PHQ-2 Total Score 0 0 0 0  PHQ-9 Total Score -- 0 0 0        Review of Systems:  Review of Systems  Musculoskeletal:  Negative for gait problem.  Neurological:  Negative for tremors.  Psychiatric/Behavioral:         Please refer to HPI   Medications: I have reviewed the patient's current medications.  Current Outpatient Medications  Medication Sig Dispense Refill   ACETAMINOPHEN-CAFFEINE PO Take by mouth in the morning.     clonazePAM (KLONOPIN) 0.5 MG tablet Take 1 tablet (0.5 mg total) by mouth daily as needed. 30 tablet 1   hydrochlorothiazide (HYDRODIURIL) 25 MG tablet Take 1 tablet (25 mg total) by mouth daily. 90 tablet 3   MAGNESIUM CHLORIDE-CALCIUM PO Take by mouth.     Melatonin 1 MG/4ML LIQD Take 5 mg  by mouth.     pantoprazole (PROTONIX) 20 MG tablet TAKE 1 TABLET BY MOUTH DAILY. 30 tablet 5   COVID-19 mRNA Vac-TriS, Pfizer, (PFIZER-BIONT COVID-19 VAC-TRIS) SUSP injection Inject into the muscle. 0.3 mL 0   FLUoxetine (PROZAC) 10 MG capsule Take 3 capsules (30 mg total) by mouth daily. 90 capsule 1   [START ON 07/21/2021] lisdexamfetamine (VYVANSE) 20 MG capsule Take 1 capsule (20 mg total) by mouth daily. 90 capsule 0   Probiotic Product (PROBIOTIC DAILY PO) Take by mouth.  (Patient not taking: Reported on 06/09/2021)     No current facility-administered medications for this visit.    Medication Side Effects: None  Allergies:  Allergies  Allergen Reactions   Eggs Or Egg-Derived Products Other (See Comments)    Makes pt feel like burping up rotten eggs    Past Medical History:  Diagnosis Date   Arthritis    Chronic fatigue syndrome    Diverticulosis    External hemorrhoids    Hypertension    Internal hemorrhoids    Neuropathy    Spinal stenosis     Past Medical History, Surgical history, Social history, and Family history were reviewed and updated as appropriate.   Please see review of systems for further details on the patient's review from today.   Objective:   Physical Exam:  BP 103/65   Pulse 87   LMP 01/26/2011   Physical Exam Constitutional:      General: She is not in acute distress. Musculoskeletal:        General: No deformity.  Neurological:     Mental Status: She is alert and oriented to person, place, and time.     Coordination: Coordination normal.  Psychiatric:        Attention and Perception: Attention and perception normal. She does not perceive auditory or visual hallucinations.        Mood and Affect: Mood normal. Mood is not anxious or depressed. Affect is not labile, blunt, angry or inappropriate.        Speech: Speech normal.        Behavior: Behavior normal.        Thought Content: Thought content normal. Thought content is not paranoid or delusional. Thought content does not include homicidal or suicidal ideation. Thought content does not include homicidal or suicidal plan.        Cognition and Memory: Cognition and memory normal.        Judgment: Judgment normal.     Comments: Insight intact    Lab Review:     Component Value Date/Time   NA 139 02/18/2021 0856   K 3.3 (L) 02/18/2021 0856   CL 101 02/18/2021 0856   CO2 31 02/18/2021 0856   GLUCOSE 102 (H) 02/18/2021 0856   BUN 18 02/18/2021 0856    CREATININE 0.81 02/18/2021 0856   CREATININE 0.83 02/15/2020 1627   CALCIUM 9.5 02/18/2021 0856   PROT 6.7 02/18/2021 0856   ALBUMIN 4.2 02/18/2021 0856   AST 22 02/18/2021 0856   ALT 17 02/18/2021 0856   ALKPHOS 53 02/18/2021 0856   BILITOT 0.5 02/18/2021 0856   GFRNONAA >90 09/18/2012 0525   GFRAA >90 09/18/2012 0525       Component Value Date/Time   WBC 3.1 (L) 02/18/2021 0856   RBC 4.71 02/18/2021 0856   HGB 12.9 02/18/2021 0856   HCT 38.2 02/18/2021 0856   PLT 208.0 02/18/2021 0856   MCV 81.0 02/18/2021 0856   MCH 28.0 02/15/2020 1627  MCHC 33.9 02/18/2021 0856   RDW 13.3 02/18/2021 0856   LYMPHSABS 0.9 02/18/2021 0856   MONOABS 0.3 02/18/2021 0856   EOSABS 0.1 02/18/2021 0856   BASOSABS 0.0 02/18/2021 0856    No results found for: POCLITH, LITHIUM   No results found for: PHENYTOIN, PHENOBARB, VALPROATE, CBMZ   .res Assessment: Plan:   Will discontinue Abilify since she has not experienced any worsening in mood s/s with gradual dose reductions of Abilify and mood has remained stable with Abilify 1 mg po qd.  Continue Prozac 30 mg po qd for anxiety and depression.  Continue Vyvanse 20 mg po qd for ADD.  Continue Klonopin 0.5 mg po qd prn anxiety.  Pt to follow-up in 6-8 weeks or sooner if clinically indicated.  Patient advised to contact office with any questions, adverse effects, or acute worsening in signs and symptoms.   Tammy [name] was seen today for follow-up.  Diagnoses and all orders for this visit:  Moderate recurrent major depression (HCC) -     FLUoxetine (PROZAC) 10 MG capsule; Take 3 capsules (30 mg total) by mouth daily.  Anxiety -     FLUoxetine (PROZAC) 10 MG capsule; Take 3 capsules (30 mg total) by mouth daily.  Attention deficit disorder (ADD) in adult -     lisdexamfetamine (VYVANSE) 20 MG capsule; Take 1 capsule (20 mg total) by mouth daily.    Please see After Visit Summary for patient specific instructions.  Future Appointments  Date  Time Provider [place]  06/23/2021  9:00 AM [facility] NURSE [facility]  07/29/2021  1:15 PM Thayer Headings, PMHNP CP[facility]CP None  02/19/2022  8:00 AM [name], MD [facility]    No orders of the defined types were placed in this encounter.   -------------------------------

## 2021-06-09 NOTE — Progress Notes (Signed)
   06/09/21 1358  Facial and Oral Movements  Muscles of Facial Expression 0  Lips and Perioral Area 0  Jaw 0  Tongue 0  Extremity Movements  Upper (arms, wrists, hands, fingers) 0  Lower (legs, knees, ankles, toes) 0  Trunk Movements  Neck, shoulders, hips 0  Overall Severity  Severity of abnormal movements (highest score from questions above) 0  Incapacitation due to abnormal movements 0  Patient's awareness of abnormal movements (rate only patient's report) 0  AIMS Total Score  AIMS Total Score 0

## 2021-06-10 LAB — HM PAP SMEAR

## 2021-06-11 DIAGNOSIS — F332 Major depressive disorder, recurrent severe without psychotic features: Secondary | ICD-10-CM | POA: Diagnosis not present

## 2021-06-11 DIAGNOSIS — F411 Generalized anxiety disorder: Secondary | ICD-10-CM | POA: Diagnosis not present

## 2021-06-23 ENCOUNTER — Other Ambulatory Visit: Payer: Self-pay

## 2021-06-23 ENCOUNTER — Ambulatory Visit (INDEPENDENT_AMBULATORY_CARE_PROVIDER_SITE_OTHER): Payer: 59

## 2021-06-23 DIAGNOSIS — Z23 Encounter for immunization: Secondary | ICD-10-CM

## 2021-06-23 NOTE — Progress Notes (Signed)
Pt had Shingles #2 vaccine in the office today. She tolerated well.

## 2021-06-25 DIAGNOSIS — F411 Generalized anxiety disorder: Secondary | ICD-10-CM | POA: Diagnosis not present

## 2021-06-25 DIAGNOSIS — F332 Major depressive disorder, recurrent severe without psychotic features: Secondary | ICD-10-CM | POA: Diagnosis not present

## 2021-06-30 ENCOUNTER — Other Ambulatory Visit (HOSPITAL_COMMUNITY): Payer: Self-pay

## 2021-07-07 ENCOUNTER — Telehealth: Payer: Self-pay | Admitting: Psychiatry

## 2021-07-07 ENCOUNTER — Other Ambulatory Visit (HOSPITAL_COMMUNITY): Payer: Self-pay

## 2021-07-07 DIAGNOSIS — F331 Major depressive disorder, recurrent, moderate: Secondary | ICD-10-CM

## 2021-07-07 DIAGNOSIS — F332 Major depressive disorder, recurrent severe without psychotic features: Secondary | ICD-10-CM | POA: Diagnosis not present

## 2021-07-07 DIAGNOSIS — F411 Generalized anxiety disorder: Secondary | ICD-10-CM | POA: Diagnosis not present

## 2021-07-07 MED ORDER — ARIPIPRAZOLE 2 MG PO TABS
ORAL_TABLET | ORAL | 1 refills | Status: DC
  Filled 2021-07-07: qty 30, 30d supply, fill #0

## 2021-07-07 NOTE — Telephone Encounter (Signed)
Pt requested a call from you,I told her if you have the time you may be able to call or will send a message through me.She stated she has increased depression over this past week and very tearful.She wants to know if this is d/c effects or if she needs to go back on the med.

## 2021-07-07 NOTE — Telephone Encounter (Signed)
Pt called and would like to talk to Janett Billow about problems she is having discontinuing the Abilify.  Next appt 11/9

## 2021-07-07 NOTE — Telephone Encounter (Signed)
Returned call to pt. She reports, "I've really been struggling." She reports that last week was 3 weeks since she stopped Abilify. She reports that she was crying all last weekend and noticed rumination. Notices increased anxiety. Denies any acute psychosocial stressors or identifiable triggers. She reports that she had difficulty engaging in activities over the weekend. She reports that she has implemented coping skills and has not experienced relief.   Will re-start Abilify 2 mg 1/2 tablet daily. Discussed that she could increase to 1 tablet daily if no improvement in mood and anxiety after 2 weeks. Advised pt to call with any questions. Has apt scheduled for 07/29/21.

## 2021-07-09 ENCOUNTER — Ambulatory Visit: Payer: 59 | Attending: Internal Medicine

## 2021-07-09 DIAGNOSIS — F332 Major depressive disorder, recurrent severe without psychotic features: Secondary | ICD-10-CM | POA: Diagnosis not present

## 2021-07-09 DIAGNOSIS — F411 Generalized anxiety disorder: Secondary | ICD-10-CM | POA: Diagnosis not present

## 2021-07-09 DIAGNOSIS — Z23 Encounter for immunization: Secondary | ICD-10-CM

## 2021-07-09 NOTE — Progress Notes (Signed)
   Covid-19 Vaccination Clinic  Name:  Tammy [name]    MRN: [id number] DOB: 08/31/1961  07/09/2021  [name] was observed post Covid-19 immunization for 15 minutes without incident. She was provided with Vaccine Information Sheet and instruction to access the V-Safe system.   [name] was instructed to call 911 with any severe reactions post vaccine: Difficulty breathing  Swelling of face and throat  A fast heartbeat  A bad rash all over body  Dizziness and weakness   Immunizations Administered     Name Date Dose VIS Date Route   Pfizer Covid-19 Vaccine Bivalent Booster 07/09/2021  1:27 PM 0.3 mL 05/20/2021 Intramuscular   Manufacturer: [place]   Lot: [id number]   [place]: [phone]

## 2021-07-16 DIAGNOSIS — F411 Generalized anxiety disorder: Secondary | ICD-10-CM | POA: Diagnosis not present

## 2021-07-16 DIAGNOSIS — F332 Major depressive disorder, recurrent severe without psychotic features: Secondary | ICD-10-CM | POA: Diagnosis not present

## 2021-07-17 ENCOUNTER — Other Ambulatory Visit (HOSPITAL_COMMUNITY): Payer: Self-pay

## 2021-07-23 DIAGNOSIS — F332 Major depressive disorder, recurrent severe without psychotic features: Secondary | ICD-10-CM | POA: Diagnosis not present

## 2021-07-23 DIAGNOSIS — F411 Generalized anxiety disorder: Secondary | ICD-10-CM | POA: Diagnosis not present

## 2021-07-29 ENCOUNTER — Encounter: Payer: Self-pay | Admitting: Psychiatry

## 2021-07-29 ENCOUNTER — Other Ambulatory Visit (HOSPITAL_COMMUNITY): Payer: Self-pay

## 2021-07-29 ENCOUNTER — Other Ambulatory Visit: Payer: Self-pay

## 2021-07-29 ENCOUNTER — Ambulatory Visit (INDEPENDENT_AMBULATORY_CARE_PROVIDER_SITE_OTHER): Payer: 59 | Admitting: Psychiatry

## 2021-07-29 DIAGNOSIS — F419 Anxiety disorder, unspecified: Secondary | ICD-10-CM

## 2021-07-29 DIAGNOSIS — F3342 Major depressive disorder, recurrent, in full remission: Secondary | ICD-10-CM

## 2021-07-29 DIAGNOSIS — F331 Major depressive disorder, recurrent, moderate: Secondary | ICD-10-CM

## 2021-07-29 MED ORDER — FLUOXETINE HCL 10 MG PO CAPS
30.0000 mg | ORAL_CAPSULE | Freq: Every day | ORAL | 1 refills | Status: DC
Start: 2021-07-29 — End: 2021-09-28
  Filled 2021-07-29 – 2021-08-16 (×2): qty 90, 30d supply, fill #0
  Filled 2021-09-12: qty 90, 30d supply, fill #1

## 2021-07-29 MED ORDER — ARIPIPRAZOLE 2 MG PO TABS
ORAL_TABLET | ORAL | 1 refills | Status: DC
  Filled 2021-07-29: qty 90, 90d supply, fill #0

## 2021-07-29 NOTE — Progress Notes (Signed)
[name] [id number] 22-Nov-1960 60 y.o.  Subjective:   Patient ID:  Tammy [name] is a 60 y.o. (DOB 1961/07/16) female.  Chief Complaint:  Chief Complaint  Patient presents with   Follow-up    Depression, anxiety    HPI [name] presents to the office today for follow-up of depression and anxiety. She reports severe worsening in depression and anxiety after stopping Abilify. She reports that she was experiencing tearfulness, hopelessness, rumination, and diminished interest in things. She reports that when she resumed Abilify 1 mg po qd her depressive s/s began to improve almost immediately and continued to improve. She reports that she is now, "more back to my normal self than I have been 2.5 years." Denies current depressed mood. Denies any significant anxiety. "Mostly waking up with gratitude." She reports sleeping well. Has noticed some increase in appetite with re-starting Abilify. Energy and motivation have significantly improved. Concentration is adequate. Denies SI.   Past Psychiatric Medication Trials:. Cymbalta Zoloft- Interfered with concentration and thinking was not clear Lexapro- Nocturnal panic attacks.   Prozac- Was on 40 mg of Prozac at the time she was having akathisia with Abilify 2 mg. Higher doses seemed to exacerbate akathisia Abilify- helpful. Started on 2 mg daily. Had akathisia and twitching at 5 mg Rexulti- Had very low motivation and fatigue Lamictal- rash Klonopin- Takes as needed. Did not help with rumination. Ambien Vyvanse- took 20 mg in the past for ADHD. Re-started 10 mg dose. Remeron- Gained 20 lbs. Gabapentin- Given for neuropathy and caused cloudy thoughts.  Lamotrigine- tingling/burning around mouth and vagina. No rash or blisters.  May have been helpful for mood.   Had Genesight testing that indicated adverse effects to Zoloft, Lexapro, and Paxil.  [place] Office Visit from 07/29/2021 in [place]  Office Visit from 06/09/2021 in [place] Total Score 0 0      GAD-7    [place] Office Visit from 02/18/2021 in [place]  Total GAD-7 Score 0      PHQ2-9    [place] Office Visit from 02/18/2021 in [place] Visit from 07/24/2020 in [place] Visit from 03/31/2020 in [place] Visit from 02/15/2020 in [place]  PHQ-2 Total Score 0 0 0 0  PHQ-9 Total Score -- 0 0 0        Review of Systems:  Review of Systems  Musculoskeletal:  Negative for gait problem.  Neurological:  Negative for tremors.  Psychiatric/Behavioral:         Please refer to HPI   Medications: I have reviewed the patient's current medications.  Current Outpatient Medications  Medication Sig Dispense Refill   clonazePAM (KLONOPIN) 0.5 MG tablet Take 1 tablet (0.5 mg total) by mouth daily as needed. 30 tablet 1   hydrochlorothiazide (HYDRODIURIL) 25 MG tablet Take 1 tablet (25 mg total) by mouth daily. 90 tablet 3   MAGNESIUM CHLORIDE-CALCIUM PO Take by mouth.     Melatonin 1 MG/4ML LIQD Take 5 mg by mouth.     pantoprazole (PROTONIX) 20 MG tablet TAKE 1 TABLET BY MOUTH DAILY. 30 tablet 5   ACETAMINOPHEN-CAFFEINE PO Take by mouth in the morning.     ARIPiprazole (ABILIFY) 2 MG tablet Take 1/2 to 1 tablet by mouth once daily. 90 tablet 1   COVID-19 mRNA Vac-TriS, Pfizer, (PFIZER-BIONT COVID-19 VAC-TRIS) SUSP injection Inject into  the muscle. 0.3 mL 0   FLUoxetine (PROZAC) 10 MG capsule Take 3 capsules (30 mg total) by mouth daily. 90 capsule 1   lisdexamfetamine (VYVANSE) 20 MG capsule Take 1 capsule (20 mg total) by mouth daily. 90 capsule 0   Probiotic Product (PROBIOTIC DAILY PO) Take by mouth. (Patient not taking: Reported on 06/09/2021)     No current facility-administered medications for this visit.    Medication Side Effects:  Other: Some increase in appetite  Allergies:  Allergies  Allergen Reactions   Eggs Or Egg-Derived Products Other (See Comments)    Makes pt feel like burping up rotten eggs    Past Medical History:  Diagnosis Date   Arthritis    Chronic fatigue syndrome    Diverticulosis    External hemorrhoids    Hypertension    Internal hemorrhoids    Neuropathy    Spinal stenosis     Past Medical History, Surgical history, Social history, and Family history were reviewed and updated as appropriate.   Please see review of systems for further details on the patient's review from today.   Objective:   Physical Exam:  BP 137/76   Pulse 90   LMP 01/26/2011   Physical Exam Constitutional:      General: She is not in acute distress. Musculoskeletal:        General: No deformity.  Neurological:     Mental Status: She is alert and oriented to person, place, and time.     Coordination: Coordination normal.  Psychiatric:        Attention and Perception: Attention and perception normal. She does not perceive auditory or visual hallucinations.        Mood and Affect: Mood normal. Mood is not anxious or depressed. Affect is not labile, blunt, angry or inappropriate.        Speech: Speech normal.        Behavior: Behavior normal.        Thought Content: Thought content normal. Thought content is not paranoid or delusional. Thought content does not include homicidal or suicidal ideation. Thought content does not include homicidal or suicidal plan.        Cognition and Memory: Cognition and memory normal.        Judgment: Judgment normal.     Comments: Insight intact    Lab Review:     Component Value Date/Time   NA 139 02/18/2021 0856   K 3.3 (L) 02/18/2021 0856   CL 101 02/18/2021 0856   CO2 31 02/18/2021 0856   GLUCOSE 102 (H) 02/18/2021 0856   BUN 18 02/18/2021 0856   CREATININE 0.81 02/18/2021 0856   CREATININE 0.83 02/15/2020 1627   CALCIUM 9.5 02/18/2021 0856   PROT 6.7  02/18/2021 0856   ALBUMIN 4.2 02/18/2021 0856   AST 22 02/18/2021 0856   ALT 17 02/18/2021 0856   ALKPHOS 53 02/18/2021 0856   BILITOT 0.5 02/18/2021 0856   GFRNONAA >90 09/18/2012 0525   GFRAA >90 09/18/2012 0525       Component Value Date/Time   WBC 3.1 (L) 02/18/2021 0856   RBC 4.71 02/18/2021 0856   HGB 12.9 02/18/2021 0856   HCT 38.2 02/18/2021 0856   PLT 208.0 02/18/2021 0856   MCV 81.0 02/18/2021 0856   MCH 28.0 02/15/2020 1627   MCHC 33.9 02/18/2021 0856   RDW 13.3 02/18/2021 0856   LYMPHSABS 0.9 02/18/2021 0856   MONOABS 0.3 02/18/2021 0856   EOSABS 0.1 02/18/2021 0856  BASOSABS 0.0 02/18/2021 0856    No results found for: POCLITH, LITHIUM   No results found for: PHENYTOIN, PHENOBARB, VALPROATE, CBMZ   .res Assessment: Plan:    Pt seen for 30 minutes and time spent discussing recent worsening in depressive s/s and improvement and resolution of depressive s/s after taking Abilify 1 mg po qd. Discussed continuing Abilify 1mg  po qd for depression.  Continue Prozac 30 mg po qd for anxiety and depression.  Continue Vyvanse 20 mg po qd for ADHD.  Continue Klonopin 0.5 mg po qd prn anxiety.  Pt to follow-up in 2 months or sooner if clinically indicated.  Patient advised to contact office with any questions, adverse effects, or acute worsening in signs and symptoms.   Aylah was seen today for follow-up.  Diagnoses and all orders for this visit:  Recurrent major depressive disorder, in full remission ([place]) -     ARIPiprazole (ABILIFY) 2 MG tablet; Take 1/2 to 1 tablet by mouth once daily. -     FLUoxetine (PROZAC) 10 MG capsule; Take 3 capsules (30 mg total) by mouth daily.  Anxiety -     FLUoxetine (PROZAC) 10 MG capsule; Take 3 capsules (30 mg total) by mouth daily.    Please see After Visit Summary for patient specific instructions.  Future Appointments  Date Time Provider [place]  09/28/2021  1:15 PM Thayer Headings, PMHNP [facility] None  02/19/2022   8:00 AM [name], MD [facility]    No orders of the defined types were placed in this encounter.   -------------------------------

## 2021-07-30 ENCOUNTER — Other Ambulatory Visit (HOSPITAL_COMMUNITY): Payer: Self-pay

## 2021-07-30 DIAGNOSIS — F332 Major depressive disorder, recurrent severe without psychotic features: Secondary | ICD-10-CM | POA: Diagnosis not present

## 2021-07-30 DIAGNOSIS — F411 Generalized anxiety disorder: Secondary | ICD-10-CM | POA: Diagnosis not present

## 2021-07-31 ENCOUNTER — Other Ambulatory Visit (HOSPITAL_COMMUNITY): Payer: Self-pay

## 2021-08-04 ENCOUNTER — Other Ambulatory Visit (HOSPITAL_BASED_OUTPATIENT_CLINIC_OR_DEPARTMENT_OTHER): Payer: Self-pay

## 2021-08-04 MED ORDER — PFIZER COVID-19 VAC BIVALENT 30 MCG/0.3ML IM SUSP
INTRAMUSCULAR | 0 refills | Status: DC
  Filled 2021-08-04: qty 0.3, 1d supply, fill #0

## 2021-08-06 DIAGNOSIS — F411 Generalized anxiety disorder: Secondary | ICD-10-CM | POA: Diagnosis not present

## 2021-08-06 DIAGNOSIS — F332 Major depressive disorder, recurrent severe without psychotic features: Secondary | ICD-10-CM | POA: Diagnosis not present

## 2021-08-17 ENCOUNTER — Other Ambulatory Visit (HOSPITAL_COMMUNITY): Payer: Self-pay

## 2021-08-20 DIAGNOSIS — F332 Major depressive disorder, recurrent severe without psychotic features: Secondary | ICD-10-CM | POA: Diagnosis not present

## 2021-08-20 DIAGNOSIS — F411 Generalized anxiety disorder: Secondary | ICD-10-CM | POA: Diagnosis not present

## 2021-08-31 ENCOUNTER — Other Ambulatory Visit (HOSPITAL_COMMUNITY): Payer: Self-pay

## 2021-09-03 DIAGNOSIS — F332 Major depressive disorder, recurrent severe without psychotic features: Secondary | ICD-10-CM | POA: Diagnosis not present

## 2021-09-03 DIAGNOSIS — F411 Generalized anxiety disorder: Secondary | ICD-10-CM | POA: Diagnosis not present

## 2021-09-13 ENCOUNTER — Other Ambulatory Visit (HOSPITAL_COMMUNITY): Payer: Self-pay

## 2021-09-15 ENCOUNTER — Other Ambulatory Visit (HOSPITAL_COMMUNITY): Payer: Self-pay

## 2021-09-17 DIAGNOSIS — F332 Major depressive disorder, recurrent severe without psychotic features: Secondary | ICD-10-CM | POA: Diagnosis not present

## 2021-09-17 DIAGNOSIS — F411 Generalized anxiety disorder: Secondary | ICD-10-CM | POA: Diagnosis not present

## 2021-09-28 ENCOUNTER — Ambulatory Visit (INDEPENDENT_AMBULATORY_CARE_PROVIDER_SITE_OTHER): Payer: 59 | Admitting: Psychiatry

## 2021-09-28 ENCOUNTER — Other Ambulatory Visit (HOSPITAL_COMMUNITY): Payer: Self-pay

## 2021-09-28 ENCOUNTER — Other Ambulatory Visit: Payer: Self-pay

## 2021-09-28 ENCOUNTER — Encounter: Payer: Self-pay | Admitting: Psychiatry

## 2021-09-28 DIAGNOSIS — F3342 Major depressive disorder, recurrent, in full remission: Secondary | ICD-10-CM

## 2021-09-28 DIAGNOSIS — F419 Anxiety disorder, unspecified: Secondary | ICD-10-CM

## 2021-09-28 DIAGNOSIS — F988 Other specified behavioral and emotional disorders with onset usually occurring in childhood and adolescence: Secondary | ICD-10-CM

## 2021-09-28 MED ORDER — FLUOXETINE HCL 10 MG PO CAPS
30.0000 mg | ORAL_CAPSULE | Freq: Every day | ORAL | 1 refills | Status: DC
  Filled 2021-09-28 – 2021-10-14 (×2): qty 270, 90d supply, fill #0

## 2021-09-28 MED ORDER — ARIPIPRAZOLE 2 MG PO TABS
ORAL_TABLET | ORAL | 1 refills | Status: DC
Start: 2021-09-28 — End: 2021-12-28
  Filled 2021-09-28: qty 90, fill #0

## 2021-09-28 MED ORDER — LISDEXAMFETAMINE DIMESYLATE 20 MG PO CAPS
20.0000 mg | ORAL_CAPSULE | Freq: Every day | ORAL | 0 refills | Status: DC
  Filled 2021-10-29: qty 90, 90d supply, fill #0

## 2021-09-28 NOTE — Progress Notes (Signed)
VIA ROSADO [id number] August 28, 1961 61 y.o.  Subjective:   Patient ID:  Tammy [name] is a 61 y.o. (DOB Jun 26, 1961) female.  Chief Complaint:  Chief Complaint  Patient presents with   Follow-up    Depression and anxiety    HPI Tammy [name] presents to the office today for follow-up of depression and anxiety.  She reports that Abilify 1 mg daily continues to be effective for her mood. She reports that she has not felt flat. Anxiety has "been ok." She reports that she has had a couple of times where she had severe anxiety/panic and took a 1/4 of Klonopin with good response. She reports that anxiety has been manageable overall. Occ very mild worry. She reports that she has had some sadness in response to the holidays and first holidays without her mother. Sleeping well. She reports that she is on day 9 of no sugar and no flour. Energy has been slightly lower since stopping sugar and flour. Denies feeling tired during the day. Motivation has been good. She reports that she is organized and has things cleaned out. Concentration has been good. Denies SI.   Had some time off over the holidays and daughter came to visit. Daughter is in a relationship for 8 years. Daughter is doing second year of PhD program. Reports good relationship with daughter.   She reports that transition to EMR at work has gone well. She reports that it is not taking longer to document.   Past Psychiatric Medication Trials:. Cymbalta Zoloft- Interfered with concentration and thinking was not clear Lexapro- Nocturnal panic attacks.   Prozac- Was on 40 mg of Prozac at the time she was having akathisia with Abilify 2 mg. Higher doses seemed to exacerbate akathisia Abilify- helpful. Started on 2 mg daily. Had akathisia and twitching at 5 mg Rexulti- Had very low motivation and fatigue Lamictal- rash Klonopin- Takes as needed. Did not help with rumination. Ambien Vyvanse- took 20 mg in the past for ADHD. Re-started 10 mg  dose. Remeron- Gained 20 lbs. Gabapentin- Given for neuropathy and caused cloudy thoughts.  Lamotrigine- tingling/burning around mouth and vagina. No rash or blisters.  May have been helpful for mood.   Had Genesight testing that indicated adverse effects to Zoloft, Lexapro, and Paxil.    [place] Office Visit from 07/29/2021 in [place] Office Visit from 06/09/2021 in [place] Total Score 0 0      GAD-7    [place] Office Visit from 02/18/2021 in [place]  Total GAD-7 Score 0      PHQ2-9    [place] Office Visit from 02/18/2021 in [place] Visit from 07/24/2020 in [facility] Visit from 03/31/2020 in [place] Visit from 02/15/2020 in [facility] [place]  PHQ-2 Total Score 0 0 0 0  PHQ-9 Total Score -- 0 0 0        Review of Systems:  Review of Systems  Musculoskeletal:  Negative for gait problem.  Neurological:  Negative for tremors.  Psychiatric/Behavioral:         Please refer to HPI   Medications: I have reviewed the patient's current medications.  Current Outpatient Medications  Medication Sig Dispense Refill   clonazePAM (KLONOPIN) 0.5 MG tablet Take 1 tablet (0.5 mg total) by mouth daily as needed. 30 tablet 1   hydrochlorothiazide (HYDRODIURIL) 25 MG tablet Take 1  tablet (25 mg total) by mouth daily. 90 tablet 3   MAGNESIUM CHLORIDE-CALCIUM PO Take by mouth.     Melatonin 1 MG/4ML LIQD Take 5 mg by mouth.     pantoprazole (PROTONIX) 20 MG tablet TAKE 1 TABLET BY MOUTH DAILY. 30 tablet 5   ACETAMINOPHEN-CAFFEINE PO Take by mouth in the morning.     ARIPiprazole (ABILIFY) 2 MG tablet Take 1/2 to 1 tablet by mouth once daily. 90 tablet 1   COVID-19 mRNA bivalent vaccine, Pfizer, (PFIZER COVID-19 VAC BIVALENT) injection Inject into the muscle. 0.3 mL 0   COVID-19  mRNA Vac-TriS, Pfizer, (PFIZER-BIONT COVID-19 VAC-TRIS) SUSP injection Inject into the muscle. 0.3 mL 0   FLUoxetine (PROZAC) 10 MG capsule Take 3 capsules (30 mg total) by mouth daily. 270 capsule 1   [START ON 10/22/2021] lisdexamfetamine (VYVANSE) 20 MG capsule Take 1 capsule by mouth daily. 90 capsule 0   Probiotic Product (PROBIOTIC DAILY PO) Take by mouth. (Patient not taking: Reported on 06/09/2021)     No current facility-administered medications for this visit.    Medication Side Effects: None  Denies involuntary movements.  Allergies:  Allergies  Allergen Reactions   Eggs Or Egg-Derived Products Other (See Comments)    Makes pt feel like burping up rotten eggs    Past Medical History:  Diagnosis Date   Arthritis    Chronic fatigue syndrome    Diverticulosis    External hemorrhoids    Hypertension    Internal hemorrhoids    Neuropathy    Spinal stenosis     Past Medical History, Surgical history, Social history, and Family history were reviewed and updated as appropriate.   Please see review of systems for further details on the patient's review from today.   Objective:   Physical Exam:  BP 108/76    Pulse 96    LMP 01/26/2011   Physical Exam Constitutional:      General: She is not in acute distress. Musculoskeletal:        General: No deformity.  Neurological:     Mental Status: She is alert and oriented to person, place, and time.     Coordination: Coordination normal.  Psychiatric:        Attention and Perception: Attention and perception normal. She does not perceive auditory or visual hallucinations.        Mood and Affect: Mood normal. Mood is not anxious or depressed. Affect is not labile, blunt, angry or inappropriate.        Speech: Speech normal.        Behavior: Behavior normal.        Thought Content: Thought content normal. Thought content is not paranoid or delusional. Thought content does not include homicidal or suicidal ideation. Thought  content does not include homicidal or suicidal plan.        Cognition and Memory: Cognition and memory normal.        Judgment: Judgment normal.     Comments: Insight intact    Lab Review:     Component Value Date/Time   NA 139 02/18/2021 0856   K 3.3 (L) 02/18/2021 0856   CL 101 02/18/2021 0856   CO2 31 02/18/2021 0856   GLUCOSE 102 (H) 02/18/2021 0856   BUN 18 02/18/2021 0856   CREATININE 0.81 02/18/2021 0856   CREATININE 0.83 02/15/2020 1627   CALCIUM 9.5 02/18/2021 0856   PROT 6.7 02/18/2021 0856   ALBUMIN 4.2 02/18/2021 0856   AST 22 02/18/2021 0856  ALT 17 02/18/2021 0856   ALKPHOS 53 02/18/2021 0856   BILITOT 0.5 02/18/2021 0856   GFRNONAA >90 09/18/2012 0525   GFRAA >90 09/18/2012 0525       Component Value Date/Time   WBC 3.1 (L) 02/18/2021 0856   RBC 4.71 02/18/2021 0856   HGB 12.9 02/18/2021 0856   HCT 38.2 02/18/2021 0856   PLT 208.0 02/18/2021 0856   MCV 81.0 02/18/2021 0856   MCH 28.0 02/15/2020 1627   MCHC 33.9 02/18/2021 0856   RDW 13.3 02/18/2021 0856   LYMPHSABS 0.9 02/18/2021 0856   MONOABS 0.3 02/18/2021 0856   EOSABS 0.1 02/18/2021 0856   BASOSABS 0.0 02/18/2021 0856    No results found for: POCLITH, LITHIUM   No results found for: PHENYTOIN, PHENOBARB, VALPROATE, CBMZ   .res Assessment: Plan:   Will continue current plan of care since target signs and symptoms are well controlled without any tolerability issues. Continue Abilify 1 mg po qd for depression.  Continue Prozac 30 mg po qd for depression and anxiety.  Continue Vyvanse 20 mg po qd for ADHD.  Pt to follow-up in 3 months or sooner if clinically indicated.  Patient advised to contact office with any questions, adverse effects, or acute worsening in signs and symptoms.  Tammy [name] was seen today for follow-up.  Diagnoses and all orders for this visit:  Recurrent major depressive disorder, in full remission ([place]) -     FLUoxetine (PROZAC) 10 MG capsule; Take 3 capsules (30 mg  total) by mouth daily. -     ARIPiprazole (ABILIFY) 2 MG tablet; Take 1/2 to 1 tablet by mouth once daily.  Anxiety -     FLUoxetine (PROZAC) 10 MG capsule; Take 3 capsules (30 mg total) by mouth daily.  Attention deficit disorder (ADD) in adult -     lisdexamfetamine (VYVANSE) 20 MG capsule; Take 1 capsule by mouth daily.     Please see After Visit Summary for patient specific instructions.  Future Appointments  Date Time Provider [place]  12/28/2021  1:15 PM Thayer Headings, PMHNP [facility] None  02/19/2022  8:00 AM [name], MD [facility]    No orders of the defined types were placed in this encounter.   -------------------------------

## 2021-09-29 ENCOUNTER — Other Ambulatory Visit (HOSPITAL_COMMUNITY): Payer: Self-pay

## 2021-10-01 DIAGNOSIS — F411 Generalized anxiety disorder: Secondary | ICD-10-CM | POA: Diagnosis not present

## 2021-10-01 DIAGNOSIS — F332 Major depressive disorder, recurrent severe without psychotic features: Secondary | ICD-10-CM | POA: Diagnosis not present

## 2021-10-14 ENCOUNTER — Other Ambulatory Visit (HOSPITAL_COMMUNITY): Payer: Self-pay

## 2021-10-24 ENCOUNTER — Ambulatory Visit (INDEPENDENT_AMBULATORY_CARE_PROVIDER_SITE_OTHER): Payer: 59

## 2021-10-24 ENCOUNTER — Other Ambulatory Visit: Payer: Self-pay

## 2021-10-24 ENCOUNTER — Ambulatory Visit (HOSPITAL_COMMUNITY)
Admission: EM | Admit: 2021-10-24 | Discharge: 2021-10-24 | Disposition: A | Discharge status: Home / Self Care | Payer: 59 | Attending: Urgent Care | Admitting: Urgent Care

## 2021-10-24 ENCOUNTER — Encounter (HOSPITAL_COMMUNITY): Payer: Self-pay

## 2021-10-24 DIAGNOSIS — M79672 Pain in left foot: Secondary | ICD-10-CM

## 2021-10-24 DIAGNOSIS — S93522A Sprain of metatarsophalangeal joint of left great toe, initial encounter: Secondary | ICD-10-CM

## 2021-10-24 NOTE — ED Triage Notes (Signed)
Pt presents with left foot injury after falling on a pool ramp this afternoon.

## 2021-10-24 NOTE — Discharge Instructions (Addendum)
Your toe pain is likely due to a hyperextension injury or tendon injury. This can cause pain, swelling and limited mobility of the toe. Ice your toe for 15 min three times daily. A warm epsom salt soak may also help. Take up to 800mg  ibuprofen three times daily with food. Stop if any GI upset occurs. Monitor for improvement, RTC in 5-7 days if any worsening symptoms. You are able to bear weight, but don't overdue exercises.  You can swim if this does not cause pain, but don't run, stair climb or do other aerobic activities for the next week. Follow up with your PCP for any additional concerns.

## 2021-10-24 NOTE — ED Provider Notes (Signed)
[facility]    CSN: [id number] Arrival date & time: 10/24/21  1622      History   Chief Complaint Chief Complaint  Patient presents with   Foot Injury    HPI Tammy [name] is a 61 y.o. female.   61 year old female presents today with acute onset of left foot pain starting this morning.  She states she was at the gym walking around the pool deck and somehow slipped on some water.  She cannot quite remember the mechanism of injury, but believes that her toe was forcefully flexed.  She did not fall and believes that her toe somehow caught her.  She states it was uncomfortable immediately after the injury, but was able to fully weight-bear.  She swam 40 laps, and then went home.  She states throughout the day the pain has gotten progressively worse and now feels that there is swelling and bruising over the ball of her foot.   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[place]) 02/15/2020   GERD (gastroesophageal reflux disease) 02/15/2020   Allergic rhinitis 03/09/2016   Essential hypertension 02/18/2014   Chronic pain syndrome 02/22/2013   Chronic insomnia 02/22/2013   Paresthesias 10/25/2012   Hyperlipidemia 09/18/2012    Past Surgical History:  Procedure Laterality Date   CARDIAC CATHETERIZATION  09/18/2012   LEFT HEART CATHETERIZATION WITH CORONARY ANGIOGRAM N/A 09/18/2012   Procedure: LEFT  HEART CATHETERIZATION WITH CORONARY ANGIOGRAM;  Surgeon: Sherren Mocha, MD;  Location: [facility] CATH LAB;  Service: Cardiovascular;  Laterality: N/A;   TONSILLECTOMY  1965    OB History   No obstetric history on file.      Home Medications    Prior to Admission medications   Medication Sig Start Date End Date Taking? Authorizing Provider  ACETAMINOPHEN-CAFFEINE PO Take by mouth in the morning.    [provider]  ARIPiprazole (ABILIFY) 2 MG tablet Take 1/2 to 1 tablet by mouth once daily. 09/28/21   Thayer Headings, PMHNP  clonazePAM (KLONOPIN) 0.5 MG tablet Take 1 tablet (0.5 mg total) by mouth daily as needed. 04/21/21 11/13/21  Thayer Headings, PMHNP  COVID-19 mRNA bivalent vaccine, Pfizer, (PFIZER COVID-19 VAC BIVALENT) injection Inject into the muscle. 07/09/21   Carlyle Basques, MD  COVID-19 mRNA Vac-TriS, Pfizer, (PFIZER-BIONT COVID-19 VAC-TRIS) SUSP injection Inject into the muscle. 01/28/21   Carlyle Basques, MD  FLUoxetine (PROZAC) 10 MG capsule Take 3 capsules (30 mg total) by mouth daily. 09/28/21   Thayer Headings, PMHNP  hydrochlorothiazide (HYDRODIURIL) 25 MG tablet Take 1 tablet (25 mg total) by mouth daily. 02/18/21   [name], MD  lisdexamfetamine (VYVANSE) 20 MG capsule Take 1 capsule by mouth daily. 10/22/21   Thayer Headings, PMHNP  MAGNESIUM CHLORIDE-CALCIUM PO Take by mouth.    [provider]  Melatonin 1 MG/4ML LIQD Take 5 mg by mouth.  [provider]  pantoprazole (PROTONIX) 20 MG tablet TAKE 1 TABLET BY MOUTH DAILY. 06/01/21 06/01/22  [name], MD  Probiotic Product (PROBIOTIC DAILY PO) Take by mouth. Patient not taking: Reported on 06/09/2021    [provider]    Family History Family History  Problem Relation Age of Onset   Heart attack Maternal Grandmother 10   Arthritis Maternal Grandmother    Hyperlipidemia Maternal Grandmother    Heart disease Maternal Grandmother    Diabetes Maternal Grandmother    Pancreatic  cancer Mother        died 69. 2.5 years past diagnosis.     Anxiety disorder Mother    Depression Mother    Colon polyps Father    Healthy Sister    Healthy Brother    Depression Brother    Heart attack Brother        age 59   Suicidality Maternal Grandfather    Colon cancer Neg Hx    Esophageal cancer Neg Hx    Kidney disease Neg Hx     Social History Social History   Tobacco Use   Smoking status: Never   Smokeless tobacco: Never  Substance Use Topics   Alcohol use: No   Drug use: No     Allergies   Eggs or egg-derived products   Review of Systems Review of Systems  Musculoskeletal:  Positive for arthralgias.  All other systems reviewed and are negative.   Physical Exam Triage Vital Signs ED Triage Vitals  Enc Vitals Group     BP 10/24/21 1752 121/83     Pulse Rate 10/24/21 1752 84     Resp --      Temp 10/24/21 1752 98.3 F (36.8 C)     Temp Source 10/24/21 1752 Oral     SpO2 10/24/21 1752 99 %     Weight --      Height --      Head Circumference --      Peak Flow --      Pain Score 10/24/21 1757 9     Pain Loc --      Pain Edu? --      Excl. in [place]? --    No data found.  Updated Vital Signs BP 121/83 (BP Location: Left Arm)    Pulse 84    Temp 98.3 F (36.8 C) (Oral)    LMP 01/26/2011    SpO2 99%   Visual Acuity Right Eye Distance:   Left Eye Distance:   Bilateral Distance:    Right Eye Near:   Left Eye Near:    Bilateral Near:     Physical Exam Vitals and nursing note reviewed. Exam conducted with a chaperone present.  Constitutional:      General: She is not in acute distress.    Appearance: Normal appearance. She is normal weight. She is not ill-appearing or toxic-appearing.  HENT:     Head: Normocephalic and atraumatic.  Musculoskeletal:        General: Swelling (MINIMAL with minimal ecchymosis to L great toe IP joint) and tenderness (to L foot IP joint great toe) present. No deformity or signs of injury. Normal range of motion.      Right lower leg: No edema.     Left lower leg: No edema.  Skin:    General: Skin is warm.     Capillary Refill: Capillary refill takes less than 2 seconds.     Coloration: Skin is not jaundiced.  Findings: No bruising, erythema or rash.  Neurological:     General: No focal deficit present.     Mental Status: She is alert.     Motor: No weakness.     Coordination: Coordination normal.     Gait: Gait normal.  Psychiatric:        Mood and Affect: Mood normal.     UC Treatments / Results  Labs (all labs ordered are listed, but only abnormal results are displayed) Labs Reviewed - No data to display  EKG   Radiology DG Foot Complete Left  Result Date: 10/24/2021 CLINICAL DATA:  LEFT foot pain following fall today. Initial encounter. EXAM: LEFT FOOT - COMPLETE 3+ VIEW COMPARISON:  None. FINDINGS: Offset at the calcaneocuboid joint noted on the lateral view as but this appearance may be technical as the calcaneocuboid articulation appears unremarkable on the other views. No other fracture, subluxation or dislocation identified. No focal bony lesions are present. IMPRESSION: Offset at the calcaneocuboid joint on the lateral view, most likely technical as the calcaneocuboid articulation appears unremarkable on the other views. Correlate with pain. Electronically Signed   By: Margarette Canada M.D.   On: 10/24/2021 18:50    Procedures Procedures (including critical care time)  Medications Ordered in UC Medications - No data to display  Initial Impression / Assessment and Plan / UC Course  I have reviewed the triage vital signs and the nursing notes.  Pertinent labs & imaging results that were available during my care of the patient were reviewed by me and considered in my medical decision making (see chart for details).     Turf toe - xray benign. Supportive care with ice, NSAIDs, rest. Rehab exercises provided to patient.  Final Clinical Impressions(s) / UC Diagnoses   Final  diagnoses:  Turf toe of left foot     Discharge Instructions      Your toe pain is likely due to a hyperextension injury or tendon injury. This can cause pain, swelling and limited mobility of the toe. Ice your toe for 15 min three times daily. A warm epsom salt soak may also help. Take up to 800mg  ibuprofen three times daily with food. Stop if any GI upset occurs. Monitor for improvement, RTC in 5-7 days if any worsening symptoms. You are able to bear weight, but don't overdue exercises.  You can swim if this does not cause pain, but don't run, stair climb or do other aerobic activities for the next week. Follow up with your PCP for any additional concerns.      ED Prescriptions   None    PDMP not reviewed this encounter.   Chaney Malling, [place] 10/24/21 1907

## 2021-10-29 ENCOUNTER — Other Ambulatory Visit (HOSPITAL_COMMUNITY): Payer: Self-pay

## 2021-10-29 DIAGNOSIS — F332 Major depressive disorder, recurrent severe without psychotic features: Secondary | ICD-10-CM | POA: Diagnosis not present

## 2021-10-29 DIAGNOSIS — F411 Generalized anxiety disorder: Secondary | ICD-10-CM | POA: Diagnosis not present

## 2021-11-12 DIAGNOSIS — F411 Generalized anxiety disorder: Secondary | ICD-10-CM | POA: Diagnosis not present

## 2021-11-12 DIAGNOSIS — F332 Major depressive disorder, recurrent severe without psychotic features: Secondary | ICD-10-CM | POA: Diagnosis not present

## 2021-11-26 DIAGNOSIS — F332 Major depressive disorder, recurrent severe without psychotic features: Secondary | ICD-10-CM | POA: Diagnosis not present

## 2021-11-26 DIAGNOSIS — F411 Generalized anxiety disorder: Secondary | ICD-10-CM | POA: Diagnosis not present

## 2021-11-27 ENCOUNTER — Other Ambulatory Visit (HOSPITAL_COMMUNITY): Payer: Self-pay

## 2021-11-27 ENCOUNTER — Other Ambulatory Visit: Payer: Self-pay | Admitting: Family Medicine

## 2021-11-30 ENCOUNTER — Other Ambulatory Visit (HOSPITAL_COMMUNITY): Payer: Self-pay

## 2021-11-30 MED ORDER — PANTOPRAZOLE SODIUM 20 MG PO TBEC
DELAYED_RELEASE_TABLET | Freq: Every day | ORAL | 5 refills | Status: DC
  Filled 2021-11-30: qty 30, 30d supply, fill #0
  Filled 2021-12-31: qty 30, 30d supply, fill #1

## 2021-12-10 DIAGNOSIS — F411 Generalized anxiety disorder: Secondary | ICD-10-CM | POA: Diagnosis not present

## 2021-12-10 DIAGNOSIS — F332 Major depressive disorder, recurrent severe without psychotic features: Secondary | ICD-10-CM | POA: Diagnosis not present

## 2021-12-23 ENCOUNTER — Ambulatory Visit: Payer: 59 | Admitting: Physician Assistant

## 2021-12-28 ENCOUNTER — Other Ambulatory Visit (HOSPITAL_COMMUNITY): Payer: Self-pay

## 2021-12-28 ENCOUNTER — Encounter: Payer: Self-pay | Admitting: Psychiatry

## 2021-12-28 ENCOUNTER — Telehealth (INDEPENDENT_AMBULATORY_CARE_PROVIDER_SITE_OTHER): Payer: 59 | Admitting: Psychiatry

## 2021-12-28 DIAGNOSIS — F3342 Major depressive disorder, recurrent, in full remission: Secondary | ICD-10-CM

## 2021-12-28 DIAGNOSIS — F988 Other specified behavioral and emotional disorders with onset usually occurring in childhood and adolescence: Secondary | ICD-10-CM

## 2021-12-28 DIAGNOSIS — F419 Anxiety disorder, unspecified: Secondary | ICD-10-CM

## 2021-12-28 MED ORDER — CLONAZEPAM 0.5 MG PO TABS
0.5000 mg | ORAL_TABLET | Freq: Every day | ORAL | 0 refills | Status: DC | PRN
  Filled 2021-12-28: qty 30, 30d supply, fill #0

## 2021-12-28 MED ORDER — LISDEXAMFETAMINE DIMESYLATE 20 MG PO CAPS
20.0000 mg | ORAL_CAPSULE | Freq: Every day | ORAL | 0 refills | Status: DC
  Filled 2022-01-29: qty 90, 90d supply, fill #0

## 2021-12-28 MED ORDER — ARIPIPRAZOLE 2 MG PO TABS
ORAL_TABLET | ORAL | 1 refills | Status: DC
  Filled 2021-12-28: qty 90, 90d supply, fill #0

## 2021-12-28 MED ORDER — FLUOXETINE HCL 10 MG PO CAPS
30.0000 mg | ORAL_CAPSULE | Freq: Every day | ORAL | 1 refills | Status: DC
  Filled 2021-12-28: qty 270, 90d supply, fill #0
  Filled 2022-04-09: qty 270, 90d supply, fill #1

## 2021-12-28 MED ORDER — LISDEXAMFETAMINE DIMESYLATE 20 MG PO CAPS
20.0000 mg | ORAL_CAPSULE | Freq: Every day | ORAL | 0 refills | Status: DC
  Filled 2022-05-06: qty 90, 90d supply, fill #0

## 2021-12-28 NOTE — Progress Notes (Signed)
Tammy [name] ?[id number] ?04-10-1961 ?61 y.o. ? ?Virtual Visit via Video Note ? ?I connected with pt @ on 12/28/21 at  1:15 PM EDT by a video enabled telemedicine application and verified that I am speaking with the correct person using two identifiers. ?  ?I discussed the limitations of evaluation and management by telemedicine and the availability of in person appointments. The patient expressed understanding and agreed to proceed. ? ?I discussed the assessment and treatment plan with the patient. The patient was provided an opportunity to ask questions and all were answered. The patient agreed with the plan and demonstrated an understanding of the instructions. ?  ?The patient was advised to call back or seek an in-person evaluation if the symptoms worsen or if the condition fails to improve as anticipated. ? ?I provided 25 minutes of non-face-to-face time during this encounter.  The patient was located at home.  The provider was located at home. ? ? ?Tammy [name], PMHNP  ? ?Subjective:  ? ?Patient ID:  Tammy [name] is a 61 y.o. (DOB 1961-06-26) female. ? ?Chief Complaint:  ?Chief Complaint  ?Patient presents with  ? Follow-up  ?  Depression, Anxiety, ADHD  ? ? ?HPI ?Tammy [name] presents for follow-up of depression and anxiety. She reports, "I feel like we are on a good medication regimen." Denies depressed mood. She has had periods of some anxiety and/or rumination and is able to cope though it. She reports that she tends to have more rumination when she has more idle time and days off. She reports that she has occ thoughts of snakes being in her yard. She reports that she has had a few times where she has had some panic. She reports that on 1-2 occasions she has taken a 1/4 tablet of Klonopin to help with panic at night with good response. She reports adequate sleep most of the time. Energy and motivation have been good. Concentration has been good. She has been on a diet and avoiding sugar and flour. She  has had a 17 lbs intentional weight loss. She is following a plan with "[employer]." Denies SI.  ? ?She reports that she typically goes into the office Wednesday and otherwise is working from home.  ? ?She has been fostering kittens that are about to get adopted. She will then be getting more new kittens.  ? ?[employer] and goes with her husband. She has been swimming laps and enjoys this. Some social contact. ? ?Past Psychiatric Medication Trials: ?Cymbalta ?Zoloft- Interfered with concentration and thinking was not clear ?Lexapro- Nocturnal panic attacks.   ?Prozac- Was on 40 mg of Prozac at the time she was having akathisia with Abilify 2 mg. Higher doses seemed to exacerbate akathisia ?Abilify- helpful. Started on 2 mg daily. Had akathisia and twitching at 5 mg ?Rexulti- Had very low motivation and fatigue ?Lamictal- rash ?Klonopin- Takes as needed. Did not help with rumination. ?Ambien ?Vyvanse- took 20 mg in the past for ADHD. Re-started 10 mg dose. ?Remeron- Gained 20 lbs. ?Gabapentin- Given for neuropathy and caused cloudy thoughts.  ?Lamotrigine- tingling/burning around mouth and vagina. No rash or blisters.  May have been helpful for mood. ?  ?Had Genes[facility] testing that indicated adverse effects to Zoloft, Lexapro, and Paxil. ? ?Review of Systems:  ?Review of Systems  ?Musculoskeletal:  Negative for gait problem.  ?Neurological:  Negative for tremors.  ?Psychiatric/Behavioral:    ?     Please refer to HPI  ? ?Medications: I  have reviewed the patient's current medications. ? ? ? ? ?Medication Side Effects: None ? ?Allergies:  ?Allergies  ?Allergen Reactions  ? Eggs Or Egg-Derived Products Other (See Comments)  ?  Makes pt feel like burping up rotten eggs  ? ? ?Past Medical History:  ?Diagnosis Date  ? Arthritis   ? Chronic fatigue syndrome   ? Diverticulosis   ? External hemorrhoids   ? Hypertension   ? Internal hemorrhoids   ? Neuropathy   ? Spinal stenosis   ? ? ?Family History  ?Problem  Relation Age of Onset  ? Heart attack Maternal Grandmother 50  ? Arthritis Maternal Grandmother   ? Hyperlipidemia Maternal Grandmother   ? Heart disease Maternal Grandmother   ? Diabetes Maternal Grandmother   ? Pancreatic cancer Mother   ?     died 86. 2.5 years past diagnosis.    ? Anxiety disorder Mother   ? Depression Mother   ? Colon polyps Father   ? Healthy Sister   ? Healthy Brother   ? Depression Brother   ? Heart attack Brother   ?     age 18  ? Suicidality Maternal Grandfather   ? Colon cancer Neg Hx   ? Esophageal cancer Neg Hx   ? Kidney disease Neg Hx   ? ? ?Social History  ? ?Socioeconomic History  ? Marital status: Married  ?  Spouse name: Not on file  ? Number of children: 2  ? Years of education: Not on file  ? Highest education level: Not on file  ?Occupational History  ? Occupation: Transport planner  ?  Employer: [employer]  ?Tobacco Use  ? Smoking status: Never  ? Smokeless tobacco: Never  ?Substance and Sexual Activity  ? Alcohol use: No  ? Drug use: No  ? Sexual activity: Yes  ?Other Topics Concern  ? Not on file  ?Social History Narrative  ? Married. 2 adopted children- adopted daughter (61) when single after first marriage from [place]. Son from [place] with 2nd husband(18) in 2021. 1st husband local physician Tammy [name]  ?   ? LCSW with [employer]  ?   ? Hobbies: Engineer, agricultural- has litter of kittens in 2021, walking, time outside  ? ?Social Determinants of Health  ? ?Financial Resource Strain: Not on file  ?Food Insecurity: Not on file  ?Transportation Needs: Not on file  ?Physical Activity: Not on file  ?Stress: Not on file  ?Social Connections: Not on file  ?Intimate Partner Violence: Not on file  ? ? ?Past Medical History, Surgical history, Social history, and Family history were reviewed and updated as appropriate.  ? ?Please see review of systems for further details on the patient's review from today.  ? ?Objective:  ? ?Physical Exam:  ?BP 121/83   Pulse 84   LMP 01/26/2011   ? ?Physical Exam ?Neurological:  ?   Mental Status: She is alert and oriented to person, place, and time.  ?   Cranial Nerves: No dysarthria.  ?Psychiatric:     ?   Attention and Perception: Attention and perception normal.     ?   Mood and Affect: Mood normal.     ?   Speech: Speech normal.     ?   Behavior: Behavior is cooperative.     ?   Thought Content: Thought content normal. Thought content is not paranoid or delusional. Thought content does not include homicidal or suicidal ideation. Thought content does not include homicidal or suicidal  plan.     ?   Cognition and Memory: Cognition and memory normal.     ?   Judgment: Judgment normal.  ?   Comments: Insight intact  ? ? ?Lab Review:  ?   ?Component Value Date/Time  ? NA 139 02/18/2021 0856  ? K 3.3 (L) 02/18/2021 0856  ? CL 101 02/18/2021 0856  ? CO2 31 02/18/2021 0856  ? GLUCOSE 102 (H) 02/18/2021 0856  ? BUN 18 02/18/2021 0856  ? CREATININE 0.81 02/18/2021 0856  ? CREATININE 0.83 02/15/2020 1627  ? CALCIUM 9.5 02/18/2021 0856  ? PROT 6.7 02/18/2021 0856  ? ALBUMIN 4.2 02/18/2021 0856  ? AST 22 02/18/2021 0856  ? ALT 17 02/18/2021 0856  ? ALKPHOS 53 02/18/2021 0856  ? BILITOT 0.5 02/18/2021 0856  ? GFRNONAA >90 09/18/2012 0525  ? GFRAA >90 09/18/2012 0525  ? ? ?   ?Component Value Date/Time  ? WBC 3.1 (L) 02/18/2021 0856  ? RBC 4.71 02/18/2021 0856  ? HGB 12.9 02/18/2021 0856  ? HCT 38.2 02/18/2021 0856  ? PLT 208.0 02/18/2021 0856  ? MCV 81.0 02/18/2021 0856  ? MCH 28.0 02/15/2020 1627  ? MCHC 33.9 02/18/2021 0856  ? RDW 13.3 02/18/2021 0856  ? LYMPHSABS 0.9 02/18/2021 0856  ? MONOABS 0.3 02/18/2021 0856  ? EOSABS 0.1 02/18/2021 0856  ? BASOSABS 0.0 02/18/2021 0856  ? ? ?No results found for: POCLITH, LITHIUM  ? ?No results found for: PHENYTOIN, PHENOBARB, VALPROATE, CBMZ  ? ?.res ?Assessment: Plan:   ? ?Pt seen for 25 minutes and time spent reviewing treatment plan. Will send in two 90-day scripts for Vyvanse since pt requests follow-up in 4 months since  3 month follow-up would coincide with when she will be away on vacation.  ?Will continue current plan of care since target signs and symptoms are well controlled without any tolerability issues. ?Continue Abilify

## 2021-12-29 ENCOUNTER — Other Ambulatory Visit (HOSPITAL_COMMUNITY): Payer: Self-pay

## 2021-12-31 ENCOUNTER — Other Ambulatory Visit (HOSPITAL_COMMUNITY): Payer: Self-pay

## 2022-01-06 DIAGNOSIS — F411 Generalized anxiety disorder: Secondary | ICD-10-CM | POA: Diagnosis not present

## 2022-01-06 DIAGNOSIS — F332 Major depressive disorder, recurrent severe without psychotic features: Secondary | ICD-10-CM | POA: Diagnosis not present

## 2022-01-08 NOTE — Progress Notes (Signed)
?Tammy [name] D.O. ?[facility] ?[place] ?Phone: [phone] ?Subjective:   ?I, Tammy [name], am serving as a Education administrator for Dr. Hulan Saas. ?This visit occurred during the SARS-CoV-2 public health emergency.  Safety protocols were in place, including screening questions prior to the visit, additional usage of staff PPE, and extensive cleaning of exam room while observing appropriate contact time as indicated for disinfecting solutions.  ? ?I'm seeing this patient by the request  of:  [name], MD ? ?CC: left hip pain  ? ?OBS:[id number]  ?Last seen July 2021 ? ?Tammy [name] is a 61 y.o. female coming in with complaint of left side hip pain. Haven't been able to walk for her usual 27mns a day. Has been lap swimming. Cannot lay on the left side. Sometimes will wake her a night. Limited ER. Sometimes pain radiated to feet. Has on and off days. Stretches helps. ? ? ? ?  ? ?Past Medical History:  ?Diagnosis Date  ? Arthritis   ? Chronic fatigue syndrome   ? Diverticulosis   ? External hemorrhoids   ? Hypertension   ? Internal hemorrhoids   ? Neuropathy   ? Spinal stenosis   ? ?Past Surgical History:  ?Procedure Laterality Date  ? CARDIAC CATHETERIZATION  09/18/2012  ? LEFT HEART CATHETERIZATION WITH CORONARY ANGIOGRAM N/A 09/18/2012  ? Procedure: LEFT HEART CATHETERIZATION WITH CORONARY ANGIOGRAM;  Surgeon: MSherren Mocha MD;  [facility];  Service: Cardiovascular;  Laterality: N/A;  ? TONSILLECTOMY  1965  ? ?Social History  ? ?Socioeconomic History  ? Marital status: Married  ?  Spouse name: Not on file  ? Number of children: 2  ? Years of education: Not on file  ? Highest education level: Not on file  ?Occupational History  ? Occupation: TTransport planner ?  Employer: [employer] ?Tobacco Use  ? Smoking status: Never  ? Smokeless tobacco: Never  ?Substance and Sexual Activity  ? Alcohol use: No  ? Drug use: No  ? Sexual activity: Yes  ?Other Topics Concern  ?  Not on file  ?Social History Narrative  ? Married. 2 adopted children- adopted daughter ((20 when single [place]. 1st husband local physician MLinus Mako ?   ? 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   ? 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 ?Social Determinants of Health  ? ?Financial Resource Strain: Not on file  ?Food Insecurity: Not on file  ?Transportation Needs: Not on file  ?Physical Activity: Not on file  ?Stress: Not on file  ?Social Connections: Not on file  ? ?Allergies  ?Allergen Reactions  ? Eggs Or Egg-Derived Products Other (See Comments)  ?  Makes pt feel like burping up rotten eggs  ? ?Family History  ?Problem Relation Age of Onset  ? Heart attack Maternal Grandmother 50  ? Arthritis Maternal Grandmother   ? Hyperlipidemia Maternal Grandmother   ? Heart disease Maternal Grandmother   ? Diabetes Maternal Grandmother   ? Pancreatic cancer Mother   ?     died 837 2.5 years past diagnosis.    ? Anxiety disorder Mother   ? Depression Mother   ? Colon polyps Father   ? Healthy Sister   ? Healthy Brother   ? Depression Brother   ? Heart attack Brother   ?     age 951 ?  Suicidality Maternal Grandfather   ? Colon cancer Neg Hx   ? Esophageal cancer Neg Hx   ? Kidney disease Neg Hx   ? ? ? ? ?Reviewed prior external information including notes and imaging from  ?primary care provider ?As well as notes that were available from care everywhere and other healthcare systems. ? ?Past medical history, social, surgical and family history all reviewed in electronic medical record.  No pertanent information unless stated regarding to the chief complaint.  ? ?Review of Systems: ? No headache, visual changes, nausea, vomiting, diarrhea, constipation, dizziness, abdominal pain, skin rash, fevers, chills, night sweats, weight loss, swollen lymph nodes, body aches, joint swelling, chest pain, shortness of breath, mood  changes. POSITIVE muscle aches ? ?Objective  ?Blood pressure 136/88, pulse 92, height '5\' 2"'$  (1.575 m), weight 157 lb (71.2 kg), last menstrual period 01/26/2011, SpO2 99 %. ?  ?General: No apparent distress alert and oriented x3 mood and affect normal, dressed appropriately.  ?HEENT: Pupils equal, extraocular movements intact  ?Respiratory: Patient's speak in full sentences and does not appear short of breath  ?Cardiovascular: No lower extremity edema, non tender, no erythema  ?MSK: Patient does have tenderness to palpation lateral left side of the hip.  Positive Corky Sox on the left side negative straight leg test.  Very mild tenderness noted in the paraspinal musculature of the lumbar spine left greater than right. ? ? ?After verbal consent patient was prepped with alcohol swab and with a 22-gauge 2 inch needle was used and injected into the left gluteal content area with a total of 2 cc of 0.5% Marcaine and 1 cc of Kenalog 40 mg per mill.  No blood loss.  Band-Aid placed.  Postinjection instructions given ? ?  ?Impression and Recommendations:  ?  ? ?The above documentation has been reviewed and is accurate and complete [name], DO ? ? ? ?

## 2022-01-11 ENCOUNTER — Ambulatory Visit (INDEPENDENT_AMBULATORY_CARE_PROVIDER_SITE_OTHER): Payer: 59 | Admitting: Family Medicine

## 2022-01-11 ENCOUNTER — Ambulatory Visit (INDEPENDENT_AMBULATORY_CARE_PROVIDER_SITE_OTHER): Payer: 59

## 2022-01-11 VITALS — BP 136/88 | HR 92 | Ht 62.0 in | Wt 157.0 lb

## 2022-01-11 DIAGNOSIS — M25552 Pain in left hip: Secondary | ICD-10-CM

## 2022-01-11 DIAGNOSIS — M7062 Trochanteric bursitis, left hip: Secondary | ICD-10-CM | POA: Insufficient documentation

## 2022-01-11 NOTE — Assessment & Plan Note (Signed)
Patient given injection today.  Concern for potential calcific changes, awaiting x-rays.  I do not think that there is underlying arthritic changes.  Discussed home exercises and will refer to formal physical therapy which I think will be significantly beneficial especially dry needling and other modalities.  Patient will follow-up with me again in 6 to 8 weeks otherwise. ?

## 2022-01-11 NOTE — Patient Instructions (Addendum)
PT orders in ?Do prescribed exercises at least 3x a week ?Injection today in hip ?2000iu vit d daily ?Xrays today ?See you again in 6-8 weeks ?

## 2022-01-15 ENCOUNTER — Other Ambulatory Visit (INDEPENDENT_AMBULATORY_CARE_PROVIDER_SITE_OTHER): Payer: 59

## 2022-01-15 ENCOUNTER — Other Ambulatory Visit (HOSPITAL_COMMUNITY): Payer: Self-pay

## 2022-01-15 ENCOUNTER — Encounter: Payer: Self-pay | Admitting: Physician Assistant

## 2022-01-15 ENCOUNTER — Ambulatory Visit (INDEPENDENT_AMBULATORY_CARE_PROVIDER_SITE_OTHER): Payer: 59 | Admitting: Physician Assistant

## 2022-01-15 VITALS — BP 106/58 | HR 87 | Ht 61.75 in | Wt 159.4 lb

## 2022-01-15 DIAGNOSIS — R195 Other fecal abnormalities: Secondary | ICD-10-CM

## 2022-01-15 DIAGNOSIS — K219 Gastro-esophageal reflux disease without esophagitis: Secondary | ICD-10-CM | POA: Diagnosis not present

## 2022-01-15 DIAGNOSIS — R151 Fecal smearing: Secondary | ICD-10-CM

## 2022-01-15 DIAGNOSIS — K625 Hemorrhage of anus and rectum: Secondary | ICD-10-CM

## 2022-01-15 LAB — CBC WITH DIFFERENTIAL/PLATELET
Basophils Absolute: 0 10*3/uL (ref 0.0–0.1)
Basophils Relative: 0.8 % (ref 0.0–3.0)
Eosinophils Absolute: 0.2 10*3/uL (ref 0.0–0.7)
Eosinophils Relative: 4.7 % (ref 0.0–5.0)
HCT: 36.7 % (ref 36.0–46.0)
Hemoglobin: 12.5 g/dL (ref 12.0–15.0)
Lymphocytes Relative: 23.9 % (ref 12.0–46.0)
Lymphs Abs: 1 10*3/uL (ref 0.7–4.0)
MCHC: 34 g/dL (ref 30.0–36.0)
MCV: 80.7 fl (ref 78.0–100.0)
Monocytes Absolute: 0.4 10*3/uL (ref 0.1–1.0)
Monocytes Relative: 8.6 % (ref 3.0–12.0)
Neutro Abs: 2.6 10*3/uL (ref 1.4–7.7)
Neutrophils Relative %: 62 % (ref 43.0–77.0)
Platelets: 207 10*3/uL (ref 150.0–400.0)
RBC: 4.55 Mil/uL (ref 3.87–5.11)
RDW: 14.5 % (ref 11.5–15.5)
WBC: 4.2 10*3/uL (ref 4.0–10.5)

## 2022-01-15 MED ORDER — FAMOTIDINE 40 MG PO TABS
40.0000 mg | ORAL_TABLET | Freq: Every evening | ORAL | 8 refills | Status: DC
  Filled 2022-01-15: qty 30, 30d supply, fill #0

## 2022-01-15 MED ORDER — NA SULFATE-K SULFATE-MG SULF 17.5-3.13-1.6 GM/177ML PO SOLN
1.0000 | Freq: Once | ORAL | 0 refills | Status: AC
  Filled 2022-01-15: qty 354, 1d supply, fill #0

## 2022-01-15 NOTE — Progress Notes (Signed)
? ?Subjective:  ? ? Patient ID: Tammy [name], female    DOB: 1961/08/29, 61 y.o.   MRN: [id number] ? ?HPI ?Tammy [name] is a pleasant 61 year old white female, known to Dr. Hilarie Fredrickson.  She comes in today with complaints of a few month history of daily bright red blood per rectum and mucus noted with bowel movements.  This has been associated with some seepage of material from the rectum as well requiring her to wear a pad.  She says a few weeks ago she had increasing mucus and seepage to the point that she developed a perianal rash.  She treated this with OTC ointment and that has resolved. ?She says she is having symptoms every day though some days are worse than others.  Over the past month or so she feels that she has a visible prolapse from her rectum of mucosa of at least 4 to 5 inches in length.  This does recede after a bowel movement and does not require any manual manipulation.  She is not having pain associated with this but does have rectal pressure.  Her bowel movements are normal, she is not having any problems with constipation straining etc. ?She did undergo colonoscopy in February 2020 which showed small external hemorrhoids, scattered diverticulosis, medium sized internal hemorrhoids and there was one area of friable inflamed hemorrhoidal type tissue which was biopsied and this showed prolapsed rectal tissue with no adenomatous change or polyp.  She had been treated with hydrocortisone suppositories. ? ?She also has chronic reflux.  She says that is generally manageable with low-dose pantoprazole though she frequently awakens in the morning with symptoms suggestive of sour brash/reflux with bitter taste in her mouth etc.  During the day she is not having any heartburn or indigestion denies any dysphagia.  She initially went on this medication for bloating and some indigestion.  She is asking about an alternative because she is concerned of staying on a PPI long-term. ? ?Review of Systems Pertinent positive  and negative review of systems were noted in the above HPI section.  All other review of systems was otherwise negative.  ? ? ? ?Allergies  ?Allergen Reactions  ? Eggs Or Egg-Derived Products Other (See Comments)  ?  Makes pt feel like burping up rotten eggs  ? ?Patient Active Problem List  ? Diagnosis Date Noted  ? Greater trochanteric bursitis of left hip 01/11/2022  ? Anxiety 07/24/2020  ? Nonallopathic lesion of cervical region 04/03/2020  ? Nonallopathic lesion of thoracic region 04/03/2020  ? Nonallopathic lesion of lumbar region 04/03/2020  ? Cervical stenosis of spine 03/31/2020  ? Attention deficit disorder (ADD) in adult 03/31/2020  ? Low bone density 03/17/2020  ? 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 GERD (gastroesophageal reflux disease) 02/15/2020  ? Allergic rhinitis 03/09/2016  ? Essential hypertension 02/18/2014  ? Chronic pain syndrome 02/22/2013  ? Chronic insomnia 02/22/2013  ? Paresthesias 10/25/2012  ? Hyperlipidemia 09/18/2012  ? ?Social History  ? ?Socioeconomic History  ? Marital status: Married  ?  Spouse name: Not on file  ? Number of children: 2  ? Years of education: Not on file  ? Highest education level: Not on file  ?Occupational History  ? Occupation: Transport planner  ?  Employer: [employer]  ?Tobacco Use  ? Smoking status: Never  ? Smokeless tobacco: Never  ?Substance and Sexual Activity  ? Alcohol use: No  ? Drug use: No  ? Sexual activity: Yes  ?Other Topics Concern  ?  Not on file  ?Social History Narrative  ? Married. 2 adopted children- adopted daughter (68) when single after first marriage from [place]. Son from [place] with 2nd husband(18) in 2021. 1st husband local physician Linus Mako  ?   ? LCSW with [employer]  ?   ? Hobbies: Engineer, agricultural- has litter of kittens in 2021, walking, time outside  ? ?Social Determinants of Health  ? ?Financial Resource Strain: Not on file  ?Food Insecurity: Not on file  ?Transportation Needs: Not on file  ?Physical  Activity: Not on file  ?Stress: Not on file  ?Social Connections: Not on file  ?Intimate Partner Violence: Not on file  ? ? ?Tammy [name]'s family history includes Anxiety disorder in her mother; Arthritis in her maternal grandmother; Colon polyps in her father; Depression in her brother and mother; Diabetes in her maternal grandmother; Healthy in her brother and sister; Heart attack in her brother; Heart attack (age of onset: 31) in her maternal grandmother; Heart disease in her maternal grandmother; Hyperlipidemia in her maternal grandmother; Pancreatic cancer in her mother; Suicidality in her maternal grandfather. ? ? ?   ?Objective:  ?  ?Vitals:  ? 01/15/22 1358  ?BP: (!) 106/58  ?Pulse: 87  ? ? ?Physical Exam Well-developed well-nourished older WF in no acute distress.  Height, Weight,159 BMI29.3 ? ?HEENT; nontraumatic normocephalic, EOMI, PE R LA, sclera anicteric. ?Oropharynx; not examined today ?Neck; supple, no JVD ?Cardiovascular; regular rate and rhythm with S1-S2, no murmur rub or gallop ?Pulmonary; Clear bilaterally ?Abdomen; soft, nontender, nondistended, no palpable mass or hepatosplenomegaly, bowel sounds are active ?Rectal; external hemorrhoidal tags, probable hypertrophied anal papillae external-, on digital exam there is soft polypoid tissue palpable, she is nontender.  On Valsalva, unable to demonstrate rectal prolapse however patient not able to relax to do this.  On  anoscopy there is a friable edematous polypoid appearing tissue, bright red blood noted as well as mucus. ?Skin; benign exam, no jaundice rash or appreciable lesions ?Extremities; no clubbing cyanosis or edema skin warm and dry ?Neuro/Psych; alert and oriented x4, grossly nonfocal mood and affect appropriate  ? ? ? ?   ?Assessment & Plan:  ? ?#22 61 year old white female with 3 to 7-monthhistory of daily bright red blood per rectum and mucus from the rectum associated with bowel movements, and now over the past month or so having  intermittent seepage, and has started noticing a large prolapse of tissue from her rectum with bowel movements of at least 3 to 4 inches in length. ?The prolapse recedes post bowel movement and does not require any manual manipulation. ? ?At colonoscopy in February 2020 she was noted to have medium sized internal hemorrhoids and an area of friable inflamed hemorrhoidal tissue which was biopsied and found to be prolapsed rectal tissue without adenomatous change or polypoid change ? ?On exam today unable to demonstrate rectal prolapse though patient was unable to relax to do a Valsalva maneuver, on a anoscopy there is significant friability and polypoid type tissue which bleeds with contact, there is also mucus evident, this area is soft on palpation ? ?I am concerned that she has developed rectal prolapse,, also with significant friability and polypoid appearing tissue on a anoscopy with bleeding and mucus-rule out neoplasm. ? ?#2 GERD-mild, patient prefers not to stay on a PPI ?#3 hyperlipidemia ?#4.  History of depression, ADD ?#5 chronic pain syndrome/neuropathy ?#6 chronic fatigue syndrome ? ?Plan; patient will be scheduled for colonoscopy with Dr. PHilarie Fredrickson  Procedure was  discussed in detail with patient including indications risk and benefits and she is agreeable to proceed. ?If no other significant findings at colonoscopy, then patient will need to be referred to surgery for consideration of repair of rectal prolapse. ?CBC today ?Stop pantoprazole and start Pepcid 40 mg p.o. every evening ? ?Kealan Buchan S Quintavious Rinck PA-C ?01/15/2022 ? ? ?Cc: [name], MD ?  ?

## 2022-01-15 NOTE — Patient Instructions (Addendum)
If you are age 63 or younger, your body mass index should be between 19-25. Your Body mass index is 29.39 kg/m?Marland Kitchen If this is out of the aformentioned range listed, please consider follow up with your Primary Care Provider.  ?________________________________________________________ ? ?The [facility] providers would like to encourage you to use [facility] to communicate with providers for non-urgent requests or questions.  Due to long hold times on the telephone, sending your provider a message by [facility] may be a faster and more efficient way to get a response.  Please allow 48 business hours for a response.  Please remember that this is for non-urgent requests.  ?_______________________________________________________ ? ?Your provider has requested that you go to the basement level for lab work before leaving today. Press "B" on the elevator. The lab is located at the first door on the left as you exit the elevator. ? ?You have been scheduled for a colonoscopy. Please follow written instructions given to you at your visit today.  ?Please pick up your prep supplies at the pharmacy within the next 1-3 days. ?If you use inhalers (even only as needed), please bring them with you on the day of your procedure. ? ?STOP Pantoprazole  ?START Famotidine 40 mg 1 tablet with dinner. ? ?Follow up pending the results of you Colonoscopy. ? ?Thank you for entrusting me with your care and choosing [facility]. ? ?Nicoletta Ba, PA-C ?

## 2022-01-19 ENCOUNTER — Encounter: Payer: Self-pay | Admitting: Family Medicine

## 2022-01-21 DIAGNOSIS — F332 Major depressive disorder, recurrent severe without psychotic features: Secondary | ICD-10-CM | POA: Diagnosis not present

## 2022-01-21 DIAGNOSIS — F411 Generalized anxiety disorder: Secondary | ICD-10-CM | POA: Diagnosis not present

## 2022-01-21 NOTE — Progress Notes (Signed)
Addendum: Reviewed and agree with assessment and management plan. Josepha Barbier M, MD  

## 2022-01-28 ENCOUNTER — Encounter: Payer: Self-pay | Admitting: Internal Medicine

## 2022-01-29 ENCOUNTER — Telehealth: Payer: Self-pay | Admitting: Internal Medicine

## 2022-01-29 ENCOUNTER — Other Ambulatory Visit (HOSPITAL_COMMUNITY): Payer: Self-pay

## 2022-01-29 ENCOUNTER — Other Ambulatory Visit: Payer: Self-pay | Admitting: Family Medicine

## 2022-01-29 MED ORDER — HYDROCHLOROTHIAZIDE 25 MG PO TABS
25.0000 mg | ORAL_TABLET | Freq: Every day | ORAL | 3 refills | Status: DC
  Filled 2022-01-29: qty 90, 90d supply, fill #0
  Filled 2022-05-05: qty 90, 90d supply, fill #1
  Filled 2022-08-02: qty 90, 90d supply, fill #2
  Filled 2022-10-28: qty 90, 90d supply, fill #3

## 2022-01-29 NOTE — Telephone Encounter (Signed)
Inbound call from patient stating that her insurance said she needed a pre authorization for her procedure for 5/18. Patient stated her insurance would like to be called at [phone] to discuss. Please advise.  ?

## 2022-02-01 NOTE — Therapy (Signed)
?OUTPATIENT PHYSICAL THERAPY LOWER EXTREMITY EVALUATION ? ? ?Patient Name: Tammy [name] ?MRN: [id number] ?DOB:[birth year], 61 y.o., female ?Today's Date: 02/02/2022 ? ? PT End of Session - 02/02/22 6203   ? ? Visit Number 1   ? Number of Visits 16   ? Date for PT Re-Evaluation 05/03/22   ? Authorization Type [place]   ? PT Start Time 0805   ? PT Stop Time 0845   ? PT Time Calculation (min) 40 min   ? Activity Tolerance Patient tolerated treatment well   ? Behavior During Therapy [facility] for tasks assessed/performed   ? ?  ?  ? ?  ? ? ?Past Medical History:  ?Diagnosis Date  ? Arthritis   ? Chronic fatigue syndrome   ? Diverticulosis   ? External hemorrhoids   ? Hypertension   ? Internal hemorrhoids   ? Neuropathy   ? Spinal stenosis   ? ?Past Surgical History:  ?Procedure Laterality Date  ? CARDIAC CATHETERIZATION  09/18/2012  ? LEFT HEART CATHETERIZATION WITH CORONARY ANGIOGRAM N/A 09/18/2012  ? Procedure: LEFT HEART CATHETERIZATION WITH CORONARY ANGIOGRAM;  Surgeon: Sherren Mocha, MD;  Location: [facility] CATH LAB;  Service: Cardiovascular;  Laterality: N/A;  ? TONSILLECTOMY  1965  ? ?Patient Active Problem List  ? Diagnosis Date Noted  ? Greater trochanteric bursitis of left hip 01/11/2022  ? Anxiety 07/24/2020  ? Nonallopathic lesion of cervical region 04/03/2020  ? Nonallopathic lesion of thoracic region 04/03/2020  ? Nonallopathic lesion of lumbar region 04/03/2020  ? Cervical stenosis of spine 03/31/2020  ? Attention deficit disorder (ADD) in adult 03/31/2020  ? Low bone density 03/17/2020  ? 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[place]) 02/15/2020  ? GERD (gastroesophageal reflux disease) 02/15/2020  ? Allergic rhinitis 03/09/2016  ? Essential hypertension 02/18/2014  ? Chronic pain syndrome 02/22/2013  ? Chronic insomnia 02/22/2013  ? Paresthesias 10/25/2012  ? Hyperlipidemia 09/18/2012  ? ? ?PCP: [name], MD ? ?REFERRING PROVIDER: [name], DO ? ?REFERRING DIAG: M25.552 (ICD-10-CM) - Left  hip pain ? ?THERAPY DIAG:  ?Pain in left hip ? ?Difficulty walking ? ?Muscle weakness (generalized) ? ?ONSET DATE: Fall 2022 ? ?SUBJECTIVE:  ? ?SUBJECTIVE STATEMENT: ?Pt states she is having increased L hip pain that is unclear if from hip or back. She states last fall she started having less L hip mobility.  She states that the hip feels like it locks up. She states she has not been able to walk for her usual 75mns a day. She has been swimming instead of walking due to the pain. Pt states she cannot lay on the left side. Pt states that some stretching helps but it doesn't make a large difference. She states there was no specific MOI but [facility]  ? ?Pt states that when walking it hurts with hip extension. Pt states there is no NT. Pt denies popping, clicking, catching in the hip. Pt states she has had moments during walking where she feels like her leg might give out on her. Pt has had a few moments of burning sensation before in the past. Pain does feel deep. Denies cancer red flags.  ? ?PERTINENT HISTORY: ?Depression, spinal stenosis, HTN, chronic pain ? ?PAIN:  ?Are you having pain? Yes: NPRS scale: 0.5/10 ?Pain location: lower back, ant thigh, posterior hip ?Pain description: "locking" ?Aggravating factors: walking, hip ER, stairs, crossing the legs  ?Relieving factors: swimming, hot tub,  ? ?  PRECAUTIONS: None ? ?WEIGHT BEARING RESTRICTIONS No ? ?FALLS:  ?Has patient fallen in last 6 months? No ? ?LIVING ENVIRONMENT: ?Lives with: lives with their spouse ?Lives in: House/apartment ? ? 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 ?PLOF: Independent ? ?PATIENT GOALS : Returning to normal walking ? ? ?OBJECTIVE:  ? ?DIAGNOSTIC FINDINGS:  ? ?Lumbar Xray: IMPRESSION: ?Mild-to-moderate lower lumbar spondylosis without evidence of ?interval progression from prior. No acute findings. ? ?Hip Xray FINDINGS: ?Mildly decreased bone mineralization. Minimal superior  left ?femoroacetabular joint space narrowing. Mild superolateral left ?acetabular degenerative osteophytosis. No significant degenerative ?change within the right hip, pubic symphysis, or sacroiliac joints. ?No acute fracture or dislocation. ?  ?IMPRESSION: ?Mild left femoroacetabular osteoarthritis. ?  ? ?PATIENT SURVEYS:  ?LEFS Lower Extremity Functional Score: 46 / 80 = 57.5 % ? ?COGNITION: ? Overall cognitive status: Within functional limits for tasks assessed   ?  ?SENSATION: ?WFL ? ? ?POSTURE:  ?R SB in standing and with gait ? ?PALPATION: ?TTP and hypertonicity of L hip flexors, especially in region of TFL and rec fem ? ?Mild L hip joint tightness at end range ER and flexion; springy end feel vs firm ? ?LE ROM: ? ?Passive ROM Right ?02/02/2022 Left ?02/02/2022  ?Hip flexion WFL 110  ?Hip extension WFL 0  ?Hip abduction WFL WNL  ?Hip adduction WFL WNL  ?Hip internal rotation WFL WNL p!  ?Hip external rotation WFL WNL p!  ? (Blank rows = not tested) ? ?LE MMT: ? ?MMT Right ?02/02/2022 Left ?02/02/2022  ?Hip flexion 5/5 4+/5 p!   ?Hip extension 5/5 4+/5  ?Hip abduction 5/5 5/5  ?Hip adduction 5/5 5/5  ?Knee extension 5/5 5/5  ? (Blank rows = not tested) ? ?LOWER EXTREMITY SPECIAL TESTS:  ?Hip special tests: Saralyn Pilar (FABER) test: positive , Trendelenburg test: positive , Ely's test: positive , SI compression test: negative, Hip scouring test: positive , Anterior hip impingement test: positive , and Piriformis test: positive  ? ?FUNCTIONAL TESTS:  ?Step up/step down: 6 stairs, antalgic gait pattern with descending, decreased lowering control, pain with end range hip extension ? ?GAIT: ?Distance walked: 76f ?Assistive device utilized: None ?Level of assistance: Complete Independence ?Comments: Decreased step length, R SB with gait, antalgic pattern; pain with end range extension ? ? ? ?TODAY'S TREATMENT: ? ?Exercises ?- Supine Bridge  - 2 x daily - 7 x weekly - 2 sets - 10 reps ?- Prone Quadriceps Stretch with Strap   - 2 x daily - 7 x weekly - 1 sets - 3 reps - 30 hold ?- Seated Quadratus Lumborum Stretch in Chair  - 2 x daily - 7 x weekly - 1 sets - 3 reps - 30 hold ? ? ?PATIENT EDUCATION:  ?Education details: MOI, diagnosis, prognosis, anatomy, exercise progression, DOMS expectations, muscle firing,  envelope of function, HEP, POC ? ?Person educated: Patient ?Education method: Explanation, Demonstration, Tactile cues, Verbal cues, and Handouts ?Education comprehension: verbalized understanding, returned demonstration, verbal cues required, and tactile cues required ? ? ?HOME EXERCISE PROGRAM: ?Access Code: [email or web address] ?URL: [email or web address] ?Date: 02/02/2022 ?Prepared by: ADaleen Bo? ?ASSESSMENT: ? ?CLINICAL IMPRESSION: ?Patient is a 61y.o. female who was seen today for physical therapy evaluation and treatment for cc of L hip pain. Pt's s/s appear consistent with L hip flexor irritation with potential FAI. There may be contribution from history of L/S OA. Pt does not appear to have radicular symptoms at this time though pt does report occasional feelings of descending burning  sensation. Pt's pain is sensitive and irritable with movement. Pt's largest deficit at this point is soft tissue restriction at the L anterior hip. Clinical testing did not suggest L hip joint stiffness restriction at this time. Pt would benefit from continued skilled therapy in order to reach goals and maximize functional L LE strength and ROM for full return to PLOF. ?.  ? ? ?OBJECTIVE IMPAIRMENTS Abnormal gait, decreased activity tolerance, decreased mobility, difficulty walking, decreased ROM, decreased strength, hypomobility, increased fascial restrictions, increased muscle spasms, impaired flexibility, impaired sensation, improper body mechanics, postural dysfunction, and pain.  ? ?ACTIVITY LIMITATIONS cleaning, community activity, occupation, yard work, shopping, and exercise .  ? ?PERSONAL FACTORS Age, Behavior pattern,  Past/current experiences, Time since onset of injury/illness/exacerbation, and 1-2 comorbidities:    are also affecting patient's functional outcome.  ? ? ?REHAB POTENTIAL: Good ? ?CLINICAL DECISION MAKING: St

## 2022-02-02 ENCOUNTER — Encounter (HOSPITAL_BASED_OUTPATIENT_CLINIC_OR_DEPARTMENT_OTHER): Payer: Self-pay | Admitting: Physical Therapy

## 2022-02-02 ENCOUNTER — Ambulatory Visit (HOSPITAL_BASED_OUTPATIENT_CLINIC_OR_DEPARTMENT_OTHER): Payer: 59 | Attending: Family Medicine | Admitting: Physical Therapy

## 2022-02-02 DIAGNOSIS — M25552 Pain in left hip: Secondary | ICD-10-CM | POA: Insufficient documentation

## 2022-02-02 DIAGNOSIS — R262 Difficulty in walking, not elsewhere classified: Secondary | ICD-10-CM | POA: Insufficient documentation

## 2022-02-02 DIAGNOSIS — M6281 Muscle weakness (generalized): Secondary | ICD-10-CM | POA: Insufficient documentation

## 2022-02-04 ENCOUNTER — Ambulatory Visit (AMBULATORY_SURGERY_CENTER): Payer: 59 | Admitting: Internal Medicine

## 2022-02-04 ENCOUNTER — Encounter: Payer: Self-pay | Admitting: Internal Medicine

## 2022-02-04 VITALS — BP 110/83 | HR 65 | Temp 97.3°F | Resp 12 | Ht 61.0 in | Wt 159.0 lb

## 2022-02-04 DIAGNOSIS — K219 Gastro-esophageal reflux disease without esophagitis: Secondary | ICD-10-CM | POA: Diagnosis not present

## 2022-02-04 DIAGNOSIS — I10 Essential (primary) hypertension: Secondary | ICD-10-CM | POA: Diagnosis not present

## 2022-02-04 DIAGNOSIS — K625 Hemorrhage of anus and rectum: Secondary | ICD-10-CM

## 2022-02-04 DIAGNOSIS — K5730 Diverticulosis of large intestine without perforation or abscess without bleeding: Secondary | ICD-10-CM | POA: Diagnosis not present

## 2022-02-04 DIAGNOSIS — F411 Generalized anxiety disorder: Secondary | ICD-10-CM | POA: Diagnosis not present

## 2022-02-04 DIAGNOSIS — K629 Disease of anus and rectum, unspecified: Secondary | ICD-10-CM

## 2022-02-04 DIAGNOSIS — K623 Rectal prolapse: Secondary | ICD-10-CM | POA: Diagnosis not present

## 2022-02-04 DIAGNOSIS — F332 Major depressive disorder, recurrent severe without psychotic features: Secondary | ICD-10-CM | POA: Diagnosis not present

## 2022-02-04 DIAGNOSIS — F419 Anxiety disorder, unspecified: Secondary | ICD-10-CM | POA: Diagnosis not present

## 2022-02-04 DIAGNOSIS — F329 Major depressive disorder, single episode, unspecified: Secondary | ICD-10-CM | POA: Diagnosis not present

## 2022-02-04 DIAGNOSIS — E669 Obesity, unspecified: Secondary | ICD-10-CM | POA: Diagnosis not present

## 2022-02-04 MED ORDER — SODIUM CHLORIDE 0.9 % IV SOLN: 500.0000 mL | Freq: Once | INTRAVENOUS | Status: DC

## 2022-02-04 NOTE — Op Note (Signed)
[place] Patient Name: Tammy [name] Procedure Date: 02/04/2022 3:08 PM MRN: [id number] Endoscopist: Jerene Bears , MD Age: 61 Referring MD:  Date of Birth: 1961/01/19 Gender: Female Account #: [id number] Procedure:                Colonoscopy Indications:              Rectal bleeding, fecal smearing, intermittent                            prolapsing tissue, mucus in stools (better since                            office visit with Mg 400-800 mg daily and glycerin                            supp q AM) Medicines:                Monitored Anesthesia Care Procedure:                Pre-Anesthesia Assessment:                           - Prior to the procedure, a History and Physical                            was performed, and patient medications and                            allergies were reviewed. The patient's tolerance of                            previous anesthesia was also reviewed. The risks                            and benefits of the procedure and the sedation                            options and risks were discussed with the patient.                            All questions were answered, and informed consent                            was obtained. Prior Anticoagulants: The patient has                            taken no previous anticoagulant or antiplatelet                            agents. ASA Grade Assessment: II - A patient with                            mild systemic disease. After reviewing the risks  and benefits, the patient was deemed in                            satisfactory condition to undergo the procedure.                           After obtaining informed consent, the colonoscope                            was passed under direct vision. Throughout the                            procedure, the patient's blood pressure, pulse, and                            oxygen saturations were monitored continuously. The                             PCF-HQ190L Colonoscope was introduced through the                            anus and advanced to the cecum, identified by                            appendiceal orifice and ileocecal valve. The                            colonoscopy was performed without difficulty. The                            patient tolerated the procedure well. The quality                            of the bowel preparation was good. The ileocecal                            valve, appendiceal orifice, and rectum were                            photographed. Scope In: 3:34:31 PM Scope Out: 3:56:38 PM Scope Withdrawal Time: 0 hours 13 minutes 58 seconds  Total Procedure Duration: 0 hours 22 minutes 7 seconds  Findings:                 The digital rectal exam was normal.                           A few small-mouthed diverticula were found in the                            sigmoid colon.                           A segmental area of moderately erythematous,  edematous and nodular mucosa was found in the                            rectum. This involves a segment of mucosa on one                            rectal wall (mid rectum to distal rectum). This was                            biopsied with a cold forceps for histology. Likely                            prolapse change.                           No additional abnormalities were found on                            retroflexion. Complications:            No immediate complications. Estimated Blood Loss:     Estimated blood loss was minimal. Impression:               - Diverticulosis in the sigmoid colon.                           - Erythematous/nodular and inflamed mucosa in the                            mid and distal rectum. Biopsied, likely prolapse                            changes. This is the cause of symptoms discussed in                            clinic. Recommendation:           - Patient has a contact  number available for                            emergencies. The signs and symptoms of potential                            delayed complications were discussed with the                            patient. Return to normal activities tomorrow.                            Written discharge instructions were provided to the                            patient.                           - Resume previous diet.                           -  Continue present medications.                           - If recurrent troublesome rectal symptoms                            colorectal surgery consult recommended.                           - Repeat colonoscopy in 10 years for screening                            purposes (this recall should replace the older                            recall; repeat colon for screening May 2033). Jerene Bears, MD 02/04/2022 4:11:22 PM This report has been signed electronically.

## 2022-02-04 NOTE — Progress Notes (Signed)
Report to PACU, RN, vss, BBS= Clear.  

## 2022-02-04 NOTE — Patient Instructions (Addendum)
HANDOUT GIVEN FOR DIVERTICULOSIS.  YOU HAD AN ENDOSCOPIC PROCEDURE TODAY AT [facility]:   Refer to the procedure report that was given to you for any specific questions about what was found during the examination.  If the procedure report does not answer your questions, please call your gastroenterologist to clarify.  If you requested that your care partner not be given the details of your procedure findings, then the procedure report has been included in a sealed envelope for you to review at your convenience later.  YOU SHOULD EXPECT: Some feelings of bloating in the abdomen. Passage of more gas than usual.  Walking can help get rid of the air that was put into your GI tract during the procedure and reduce the bloating. If you had a lower endoscopy (such as a colonoscopy or flexible sigmoidoscopy) you may notice spotting of blood in your stool or on the toilet paper. If you underwent a bowel prep for your procedure, you may not have a normal bowel movement for a few days.  Please Note:  You might notice some irritation and congestion in your nose or some drainage.  This is from the oxygen used during your procedure.  There is no need for concern and it should clear up in a day or so.  SYMPTOMS TO REPORT IMMEDIATELY:  Following lower endoscopy (colonoscopy):  Excessive amounts of blood in the stool  Significant tenderness or worsening of abdominal pains  Swelling of the abdomen that is new, acute  Fever of 100F or higher  For urgent or emergent issues, a gastroenterologist can be reached at any hour by calling [phone]. Do not use MyChart messaging for urgent concerns.    DIET:  We do recommend a small meal at first, but then you may proceed to your regular diet.  Drink plenty of fluids but you should avoid alcoholic beverages for 24 hours.  ACTIVITY:  You should plan to take it easy for the rest of today and you should NOT DRIVE or use heavy machinery until tomorrow  (because of the sedation medicines used during the test).    FOLLOW UP: Our staff will call the number listed on your records TOMORROW to check on you and address any questions or concerns that you may have regarding the information given to you following your procedure. If we do not reach you, we will leave a message.  We will attempt to reach you two times.  During this call, we will ask if you have developed any symptoms of COVID 19. If you develop any symptoms (ie: fever, flu-like symptoms, shortness of breath, cough etc.) before then, please call [phone].  If you test positive for Covid 19 in the 2 weeks post procedure, please call and report this information to [place].    If any biopsies were taken you will be contacted by phone or by letter within the next 1-3 weeks.  Please call us at [phone] if you have not heard about the biopsies in 3 weeks.    SIGNATURES/CONFIDENTIALITY: You and/or your care partner have signed paperwork which will be entered into your electronic medical record.  These signatures attest to the fact that that the information above on your After Visit Summary has been reviewed and is understood.  Full responsibility of the confidentiality of this discharge information lies with you and/or your care-partner.

## 2022-02-04 NOTE — Progress Notes (Signed)
Pt's states no medical or surgical changes since previsit or office visit. 

## 2022-02-04 NOTE — Progress Notes (Signed)
Called to room to assist during endoscopic procedure.  Patient ID and intended procedure confirmed with present staff. Received instructions for my participation in the procedure from the performing physician.  

## 2022-02-05 ENCOUNTER — Telehealth: Payer: Self-pay

## 2022-02-05 ENCOUNTER — Other Ambulatory Visit: Payer: Self-pay

## 2022-02-05 DIAGNOSIS — K623 Rectal prolapse: Secondary | ICD-10-CM

## 2022-02-05 NOTE — Telephone Encounter (Signed)
Referral in epic for PT.

## 2022-02-05 NOTE — Telephone Encounter (Signed)
  Follow up Call-     02/04/2022    3:09 PM  Call back number  Post procedure Call Back phone  # [phone]  Permission to leave phone message Yes     Patient questions:  Do you have a fever, pain , or abdominal swelling? No. Pain Score  0 *  Have you tolerated food without any problems? Yes.    Have you been able to return to your normal activities? Yes.    Do you have any questions about your discharge instructions: Diet   No. Medications  No. Follow up visit  No.  Do you have questions or concerns about your Care? No.  Actions: * If pain score is 4 or above: No action needed, pain <4.

## 2022-02-05 NOTE — Telephone Encounter (Signed)
-----   Message from Jerene Bears, MD sent at 02/04/2022  4:21 PM EDT ----- Please refer patient for pelvic floor physical therapy for rectal prolapse Thanks JMP

## 2022-02-06 ENCOUNTER — Ambulatory Visit (HOSPITAL_BASED_OUTPATIENT_CLINIC_OR_DEPARTMENT_OTHER): Payer: 59 | Admitting: Physical Therapy

## 2022-02-06 ENCOUNTER — Encounter (HOSPITAL_BASED_OUTPATIENT_CLINIC_OR_DEPARTMENT_OTHER): Payer: Self-pay | Admitting: Physical Therapy

## 2022-02-06 DIAGNOSIS — R262 Difficulty in walking, not elsewhere classified: Secondary | ICD-10-CM

## 2022-02-06 DIAGNOSIS — M6281 Muscle weakness (generalized): Secondary | ICD-10-CM

## 2022-02-06 DIAGNOSIS — M25552 Pain in left hip: Secondary | ICD-10-CM | POA: Diagnosis not present

## 2022-02-06 NOTE — Therapy (Signed)
OUTPATIENT PHYSICAL THERAPY LOWER EXTREMITY Treatment   Patient Name: Tammy [name] MRN: [id number] DOB:06/22/1961, 61 y.o., female Today's Date: 02/06/2022   PT End of Session - 02/06/22 1019     Visit Number 2    Number of Visits 16    Date for PT Re-Evaluation 05/03/22    Authorization Type [employer]    PT Start Time 1017    PT Stop Time 1056    PT Time Calculation (min) 39 min    Activity Tolerance Patient tolerated treatment well    Behavior During Therapy [facility] for tasks assessed/performed              Past Medical History:  Diagnosis Date   Arthritis    Chronic fatigue syndrome    Diverticulosis    External hemorrhoids    Hypertension    Internal hemorrhoids    Neuropathy    Spinal stenosis    Past Surgical History:  Procedure Laterality Date   CARDIAC CATHETERIZATION  09/18/2012   LEFT HEART CATHETERIZATION WITH CORONARY ANGIOGRAM N/A 09/18/2012   Procedure: LEFT HEART CATHETERIZATION WITH CORONARY ANGIOGRAM;  Surgeon: Sherren Mocha, MD;  Location: [facility] CATH LAB;  Service: Cardiovascular;  Laterality: N/A;   TONSILLECTOMY  1965   Patient Active Problem List   Diagnosis Date Noted   Greater trochanteric bursitis of left hip 01/11/2022   Anxiety 07/24/2020   Nonallopathic lesion of cervical region 04/03/2020   Nonallopathic lesion of thoracic region 04/03/2020   Nonallopathic lesion of lumbar region 04/03/2020   Cervical stenosis of spine 03/31/2020   Attention deficit disorder (ADD) in adult 03/31/2020   Low bone density 03/17/2020   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[place]) 02/15/2020   GERD (gastroesophageal reflux disease) 02/15/2020   Allergic rhinitis 03/09/2016   Essential hypertension 02/18/2014   Chronic pain syndrome 02/22/2013   Chronic insomnia 02/22/2013   Paresthesias 10/25/2012   Hyperlipidemia 09/18/2012    PCP: [name], MD  REFERRING PROVIDER: [name], DO  REFERRING DIAG: [phone] (ICD-10-CM) - Left  hip pain  THERAPY DIAG:  Pain in left hip  Difficulty walking  Muscle weakness (generalized)  ONSET DATE: Fall 2022  SUBJECTIVE:   SUBJECTIVE STATEMENT: Not really much change at this point.   PERTINENT HISTORY: Depression, spinal stenosis, HTN, chronic pain  PAIN:  Are you having pain? Yes: NPRS scale: 0.5 sitting, 2 walking/10 Pain location: lower back, ant thigh, posterior hip Pain description: "locking" Aggravating factors: walking, hip ER, stairs, crossing the legs  Relieving factors: swimming, hot tub,   PRECAUTIONS: None  WEIGHT BEARING RESTRICTIONS No  FALLS:  Has patient fallen in last 6 months? 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 No acute findings.  Hip Xray FINDINGS: Mildly decreased bone mineralization. Minimal superior left femoroacetabular joint space narrowing. Mild superolateral left acetabular degenerative osteophytosis. No significant degenerative change within the right hip, pubic symphysis, or sacroiliac joints. No acute fracture or dislocation.   IMPRESSION: Mild left femoroacetabular osteoarthritis.    PATIENT SURVEYS:  LEFS Lower Extremity Functional Score: 46 / 80 = 57.5 %  POSTURE:  R SB in standing and with gait  PALPATION: TTP and hypertonicity  of L hip flexors, especially in region of TFL and rec fem  Mild L hip joint tightness at end range ER and flexion; springy end feel vs firm  LE ROM:  Passive ROM Right 02/02/2022 Left 02/02/2022  Hip flexion WFL 110  Hip extension WFL 0  Hip abduction WFL WNL  Hip adduction WFL WNL  Hip  internal rotation WFL WNL p!  Hip external rotation [facility] WNL p!   (Blank rows = not tested)  LE MMT:  MMT Right 02/02/2022 Left 02/02/2022  Hip flexion 5/5 4+/5 p!   Hip extension 5/5 4+/5  Hip abduction 5/5 5/5  Hip adduction 5/5 5/5  Knee extension 5/5 5/5   (Blank rows = not tested)  LOWER EXTREMITY SPECIAL TESTS:  Hip special tests: Saralyn Pilar (FABER) test: positive , Trendelenburg test: positive , Ely's test: positive , SI compression test: negative, Hip scouring test: positive , Anterior hip impingement test: positive , and Piriformis test: positive   FUNCTIONAL TESTS:  Step up/step down: 6 stairs, antalgic gait pattern with descending, decreased lowering control, pain with end range hip extension  GAIT: Distance walked: 23f Assistive device utilized: None Level of assistance: Complete Independence Comments: Decreased step length, R SB with gait, antalgic pattern; pain with end range extension    TODAY'S TREATMENT: 5/20: IASTM Lt quads 3-layer heel lift in Left shoe SLS with level pelvis Modified pool: prone flutter kicks, LE curl ins to be in prone in pool but performed in supine on table  EVAL - Supine Bridge  - 2 x daily - 7 x weekly - 2 sets - 10 reps - Prone Quadriceps Stretch with Strap  - 2 x daily - 7 x weekly - 1 sets - 3 reps - 30 hold - Seated Quadratus Lumborum Stretch in Chair  - 2 x daily - 7 x weekly - 1 sets - 3 reps - 30 hold   PATIENT EDUCATION:  Education details:heel lift  Person educated: Patient Education method: Explanation, Demonstration, Tactile cues, Verbal cues, and Handouts Education comprehension: verbalized understanding, returned demonstration, verbal cues required, and tactile cues required   HOME EXERCISE PROGRAM: Access Code: [email or web address]: [email or web address] Date: 02/02/2022   ASSESSMENT:  CLINICAL IMPRESSION: Added a 3-layer heel llift to Lt shoe which made significant change in hip pain. Will need a  smaller size as I cut a Large to her shoe today. CKC Rt hip strengthening will benefit from further training & would like to do a pool appointment at 7:15 with JDelsa Sale  .    OBJECTIVE IMPAIRMENTS Abnormal gait, decreased activity tolerance, decreased mobility, difficulty walking, decreased ROM, decreased strength, hypomobility, increased fascial restrictions, increased muscle spasms, impaired flexibility, impaired sensation, improper body mechanics, postural dysfunction, and pain.   ACTIVITY LIMITATIONS cleaning, community activity, occupation, yard work, shopping, and exercise .   PERSONAL FACTORS Age, Behavior pattern, Past/current experiences, Time since onset of injury/illness/exacerbation, and 1-2 comorbidities:    are also affecting patient's functional outcome.    REHAB POTENTIAL: Good  CLINICAL DECISION MAKING: Stable/uncomplicated  EVALUATION COMPLEXITY: Low   GOALS:  SHORT TERM GOALS: Target date: 03/16/2022   Pt will become independent with HEP in order to demonstrate synthesis of PT education.  Goal status: INITIAL  2.  Pt will be able to demonstrate active hip extension during gait without pain in order to demonstrate functional improvement in LE function for self-care and house hold duties.   Goal status: INITIAL  3.  Pt will report at least 2 pt reduction  on NPRS scale for pain during walking in order to demonstrate functional improvement with household activity, self care, and ADL.  Goal status: INITIAL    LONG TERM GOALS: Target date: 04/27/2022    Pt  will become independent with final HEP in order to demonstrate synthesis of PT education.  Goal status: INITIAL  2.  Pt will be able to demonstrate/report ability to walk >40 mins without pain in order to demonstrate functional improvement and tolerance to exercise and community mobility.   Goal status: INITIAL  3.  Pt will have an at least 18 pt improvement in LEFS measure in order to demonstrate MCID  improvement in daily function.  Goal status: INITIAL  4.  Pt will be able to demonstrate ability to perform reciprocal stair stepping without pain or discomfort in order to demonstrate functional improvement in LE function for daily mobility and house hold duties.   Goal status: INITIAL   PLAN: PT FREQUENCY: 1-2x/week  PT DURATION: 12 weeks (likely DC by 8wks)  PLANNED INTERVENTIONS: Therapeutic exercises, Therapeutic activity, Neuromuscular re-education, Balance training, Gait training, Patient/Family education, Joint manipulation, Joint mobilization, Stair training, Orthotic/Fit training, DME instructions, Aquatic Therapy, Dry Needling, Electrical stimulation, Spinal manipulation, Spinal mobilization, Cryotherapy, Moist heat, scar mobilization, Taping, Vasopneumatic device, Traction, Ultrasound, Ionotophoresis '4mg'$ /ml Dexamethasone, Manual therapy, and Re-evaluation  PLAN FOR NEXT SESSION: outcome of heel lift? Watching for aqua appt, lumbopelvic stability   Dalisa Forrer C. Louie Meaders PT, DPT 02/06/22 11:50 AM

## 2022-02-10 ENCOUNTER — Encounter: Payer: Self-pay | Admitting: Internal Medicine

## 2022-02-11 ENCOUNTER — Ambulatory Visit (HOSPITAL_BASED_OUTPATIENT_CLINIC_OR_DEPARTMENT_OTHER): Payer: 59 | Admitting: Physical Therapy

## 2022-02-11 ENCOUNTER — Encounter (HOSPITAL_BASED_OUTPATIENT_CLINIC_OR_DEPARTMENT_OTHER): Payer: Self-pay | Admitting: Physical Therapy

## 2022-02-11 DIAGNOSIS — M6281 Muscle weakness (generalized): Secondary | ICD-10-CM | POA: Diagnosis not present

## 2022-02-11 DIAGNOSIS — R262 Difficulty in walking, not elsewhere classified: Secondary | ICD-10-CM | POA: Diagnosis not present

## 2022-02-11 DIAGNOSIS — M25552 Pain in left hip: Secondary | ICD-10-CM | POA: Diagnosis not present

## 2022-02-11 NOTE — Therapy (Signed)
OUTPATIENT PHYSICAL THERAPY LOWER EXTREMITY Treatment   Patient Name: Tammy [name] MRN: [id number] DOB:Jan 07, 1961, 61 y.o., female Today's Date: 02/11/2022   PT End of Session - 02/11/22 0844     Visit Number 3    Number of Visits 16    Date for PT Re-Evaluation 05/03/22    Authorization Type [employer]    PT Start Time 0802    PT Stop Time 0842    PT Time Calculation (min) 40 min    Activity Tolerance Patient tolerated treatment well    Behavior During Therapy [facility] for tasks assessed/performed               Past Medical History:  Diagnosis Date   Arthritis    Chronic fatigue syndrome    Diverticulosis    External hemorrhoids    Hypertension    Internal hemorrhoids    Neuropathy    Spinal stenosis    Past Surgical History:  Procedure Laterality Date   CARDIAC CATHETERIZATION  09/18/2012   LEFT HEART CATHETERIZATION WITH CORONARY ANGIOGRAM N/A 09/18/2012   Procedure: LEFT HEART CATHETERIZATION WITH CORONARY ANGIOGRAM;  Surgeon: Sherren Mocha, MD;  Location: [facility] CATH LAB;  Service: Cardiovascular;  Laterality: N/A;   TONSILLECTOMY  1965   Patient Active Problem List   Diagnosis Date Noted   Greater trochanteric bursitis of left hip 01/11/2022   Anxiety 07/24/2020   Nonallopathic lesion of cervical region 04/03/2020   Nonallopathic lesion of thoracic region 04/03/2020   Nonallopathic lesion of lumbar region 04/03/2020   Cervical stenosis of spine 03/31/2020   Attention deficit disorder (ADD) in adult 03/31/2020   Low bone density 03/17/2020   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[place]) 02/15/2020   GERD (gastroesophageal reflux disease) 02/15/2020   Allergic rhinitis 03/09/2016   Essential hypertension 02/18/2014   Chronic pain syndrome 02/22/2013   Chronic insomnia 02/22/2013   Paresthesias 10/25/2012   Hyperlipidemia 09/18/2012    PCP: [name], MD  REFERRING PROVIDER: [name], DO  REFERRING DIAG: [phone] (ICD-10-CM) -  Left hip pain  THERAPY DIAG:  Pain in left hip  Difficulty walking  Muscle weakness (generalized)  ONSET DATE: Fall 2022  SUBJECTIVE:   SUBJECTIVE STATEMENT: Pt states that STM helped at last session as well as the lift.   PERTINENT HISTORY: Depression, spinal stenosis, HTN, chronic pain  PAIN:  Are you having pain? Yes: NPRS scale: 4 walking/10 Pain location: lower back, ant thigh, posterior hip Pain description: "locking" Aggravating factors: walking, hip ER, stairs, crossing the legs  Relieving factors: swimming, hot tub,   PRECAUTIONS: None  WEIGHT BEARING RESTRICTIONS No  FALLS:  Has patient fallen in last 6 months? 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 No acute findings.  Hip Xray FINDINGS: Mildly decreased bone mineralization. Minimal superior left femoroacetabular joint space narrowing. Mild superolateral left acetabular degenerative osteophytosis. No significant degenerative change within the right hip, pubic symphysis, or sacroiliac joints. No acute fracture or dislocation.   IMPRESSION: Mild left femoroacetabular osteoarthritis.    PATIENT SURVEYS:  LEFS Lower Extremity Functional Score: 46 / 80 = 57.5 %  POSTURE:  R SB in standing and with gait  PALPATION: TTP and hypertonicity of L hip flexors, especially in region of TFL and rec fem  Mild L hip joint tightness at end range ER and flexion; springy end feel vs firm  LE ROM:  Passive ROM Right 02/02/2022 Left 02/02/2022  Hip flexion WFL 110  Hip extension WFL 0  Hip abduction WFL WNL  Hip  adduction WFL WNL  Hip internal rotation WFL WNL p!  Hip external rotation [facility] WNL p!   (Blank rows = not tested)  LE MMT:  MMT Right 02/02/2022 Left 02/02/2022  Hip flexion 5/5 4+/5 p!   Hip extension 5/5 4+/5  Hip abduction 5/5 5/5  Hip adduction 5/5 5/5  Knee extension 5/5 5/5   (Blank rows = not tested)  LOWER EXTREMITY SPECIAL TESTS:  Hip special tests: Saralyn Pilar (FABER) test: positive , Trendelenburg test: positive , Ely's test: positive , SI compression test: negative, Hip scouring test: positive , Anterior hip impingement test: positive , and Piriformis test: positive   FUNCTIONAL TESTS:  Step up/step down: 6 stairs, antalgic gait pattern with descending, decreased lowering control, pain with end range hip extension  GAIT: Distance walked: 95f Assistive device utilized: None Level of assistance: Complete Independence Comments: Decreased step length, R SB with gait, antalgic pattern; pain with end range extension    TODAY'S TREATMENT:  5/25  L rec fem, VL and TFL STM Bridge with GTB at knees 2x10 Standing hip flexor stretch 30s 3x [place] curl 5x Hip flexor isometric 5s 10x   5/20: IASTM Lt quads 3-layer heel lift in Left shoe SLS with level pelvis Modified pool: prone flutter kicks, LE curl ins to be in prone in pool but performed in supine on table  EVAL - Supine Bridge  - 2 x daily - 7 x weekly - 2 sets - 10 reps - Prone Quadriceps Stretch with Strap  - 2 x daily - 7 x weekly - 1 sets - 3 reps - 30 hold - Seated Quadratus Lumborum Stretch in Chair  - 2 x daily - 7 x weekly - 1 sets - 3 reps - 30 hold   PATIENT EDUCATION:  Education details: self STM technique, anatomy, exercise progression, DOMS expectations, muscle firing,  envelope of function, HEP, POC   Person educated: Patient Education method: Explanation, Demonstration, Tactile cues, Verbal cues, and Handouts Education comprehension: verbalized understanding, returned demonstration, verbal  cues required, and tactile cues required   HOME EXERCISE PROGRAM: Access Code: [email or web address]: [email or web address] Date: 02/02/2022   ASSESSMENT:  CLINICAL IMPRESSION: Pt with report of decreased pain following ischemic pressure STM as well as gentle hip flexor stretching. Pt able continue with hip strength at today's session. Pt without pain unless at end range with stretching. Pt does continue to have mild antalgic gait pattern but the heel lift provided previously does appear to be improving gait quality. Consider TPDN to L quad, hip flexor region at next session and adding in mulligan mobilizations if tolerated Pt would benefit from continued skilled therapy in order to reach goals and maximize functional L LE strength and ROM for full return to PLOF.     OBJECTIVE IMPAIRMENTS Abnormal gait, decreased activity tolerance, decreased mobility, difficulty walking, decreased ROM, decreased strength, hypomobility, increased fascial restrictions, increased muscle spasms, impaired flexibility, impaired sensation, improper body mechanics, postural dysfunction, and pain.   ACTIVITY LIMITATIONS cleaning, community activity, occupation, yard work, shopping, and exercise .   PERSONAL FACTORS Age, Behavior pattern, Past/current experiences, Time since onset of injury/illness/exacerbation, and 1-2 comorbidities:  are also affecting patient's functional outcome.    REHAB POTENTIAL: Good  CLINICAL DECISION MAKING: Stable/uncomplicated  EVALUATION COMPLEXITY: Low   GOALS:  SHORT TERM GOALS: Target date: 03/16/2022   Pt will become independent with HEP in order to demonstrate synthesis of PT education.  Goal status: INITIAL  2.  Pt will be able to demonstrate active hip extension during gait without pain in order to demonstrate functional improvement in LE function for self-care and house hold duties.   Goal status: INITIAL  3.  Pt will report at least 2 pt reduction on  NPRS scale for pain during walking in order to demonstrate functional improvement with household activity, self care, and ADL.  Goal status: INITIAL    LONG TERM GOALS: Target date: 04/27/2022    Pt  will become independent with final HEP in order to demonstrate synthesis of PT education.  Goal status: INITIAL  2.  Pt will be able to demonstrate/report ability to walk >40 mins without pain in order to demonstrate functional improvement and tolerance to exercise and community mobility.   Goal status: INITIAL  3.  Pt will have an at least 18 pt improvement in LEFS measure in order to demonstrate MCID improvement in daily function.  Goal status: INITIAL  4.  Pt will be able to demonstrate ability to perform reciprocal stair stepping without pain or discomfort in order to demonstrate functional improvement in LE function for daily mobility and house hold duties.   Goal status: INITIAL   PLAN: PT FREQUENCY: 1-2x/week  PT DURATION: 12 weeks (likely DC by 8wks)  PLANNED INTERVENTIONS: Therapeutic exercises, Therapeutic activity, Neuromuscular re-education, Balance training, Gait training, Patient/Family education, Joint manipulation, Joint mobilization, Stair training, Orthotic/Fit training, DME instructions, Aquatic Therapy, Dry Needling, Electrical stimulation, Spinal manipulation, Spinal mobilization, Cryotherapy, Moist heat, scar mobilization, Taping, Vasopneumatic device, Traction, Ultrasound, Ionotophoresis '4mg'$ /ml Dexamethasone, Manual therapy, and Re-evaluation  PLAN FOR NEXT SESSION: outcome of heel lift? Watching for aqua appt, lumbopelvic stability  Daleen Bo PT, DPT 02/11/22 9:02 AM

## 2022-02-16 ENCOUNTER — Other Ambulatory Visit (HOSPITAL_BASED_OUTPATIENT_CLINIC_OR_DEPARTMENT_OTHER): Payer: Self-pay | Admitting: Family Medicine

## 2022-02-16 DIAGNOSIS — Z1231 Encounter for screening mammogram for malignant neoplasm of breast: Secondary | ICD-10-CM

## 2022-02-17 ENCOUNTER — Ambulatory Visit (HOSPITAL_BASED_OUTPATIENT_CLINIC_OR_DEPARTMENT_OTHER): Payer: 59 | Admitting: Physical Therapy

## 2022-02-17 ENCOUNTER — Encounter (HOSPITAL_BASED_OUTPATIENT_CLINIC_OR_DEPARTMENT_OTHER): Payer: Self-pay | Admitting: Physical Therapy

## 2022-02-17 DIAGNOSIS — M6281 Muscle weakness (generalized): Secondary | ICD-10-CM

## 2022-02-17 DIAGNOSIS — M25552 Pain in left hip: Secondary | ICD-10-CM | POA: Diagnosis not present

## 2022-02-17 DIAGNOSIS — R262 Difficulty in walking, not elsewhere classified: Secondary | ICD-10-CM | POA: Diagnosis not present

## 2022-02-17 NOTE — Therapy (Signed)
OUTPATIENT PHYSICAL THERAPY LOWER EXTREMITY Treatment   Patient Name: Tammy [name] MRN: [id number] DOB:February 24, 1961, 61 y.o., female Today's Date: 02/17/2022   PT End of Session - 02/17/22 0717     Visit Number 4    Number of Visits 16    Date for PT Re-Evaluation 05/03/22    Authorization Type [employer]    PT Start Time 0715    PT Stop Time 0755    PT Time Calculation (min) 40 min    Activity Tolerance Patient tolerated treatment well    Behavior During Therapy [facility] for tasks assessed/performed               Past Medical History:  Diagnosis Date   Arthritis    Chronic fatigue syndrome    Diverticulosis    External hemorrhoids    Hypertension    Internal hemorrhoids    Neuropathy    Spinal stenosis    Past Surgical History:  Procedure Laterality Date   CARDIAC CATHETERIZATION  09/18/2012   LEFT HEART CATHETERIZATION WITH CORONARY ANGIOGRAM N/A 09/18/2012   Procedure: LEFT HEART CATHETERIZATION WITH CORONARY ANGIOGRAM;  Surgeon: Sherren Mocha, MD;  Location: [facility] CATH LAB;  Service: Cardiovascular;  Laterality: N/A;   TONSILLECTOMY  1965   Patient Active Problem List   Diagnosis Date Noted   Greater trochanteric bursitis of left hip 01/11/2022   Anxiety 07/24/2020   Nonallopathic lesion of cervical region 04/03/2020   Nonallopathic lesion of thoracic region 04/03/2020   Nonallopathic lesion of lumbar region 04/03/2020   Cervical stenosis of spine 03/31/2020   Attention deficit disorder (ADD) in adult 03/31/2020   Low bone density 03/17/2020   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[place]) 02/15/2020   GERD (gastroesophageal reflux disease) 02/15/2020   Allergic rhinitis 03/09/2016   Essential hypertension 02/18/2014   Chronic pain syndrome 02/22/2013   Chronic insomnia 02/22/2013   Paresthesias 10/25/2012   Hyperlipidemia 09/18/2012    PCP: [name], MD  REFERRING PROVIDER: [name], DO  REFERRING DIAG: [phone] (ICD-10-CM) -  Left hip pain  THERAPY DIAG:  Pain in left hip  Difficulty walking  Muscle weakness (generalized)  ONSET DATE: Fall 2022  SUBJECTIVE:   SUBJECTIVE STATEMENT: Pt states that STM helped at last session as well as the lift; she has ordered more heel lifts.   PERTINENT HISTORY: Depression, spinal stenosis, HTN, chronic pain  PAIN:  Are you having pain? Yes: NPRS scale: 2/10 Pain location: lower back, ant thigh, posterior hip Pain description: "tight Aggravating factors: walking, hip ER, stairs, crossing the legs  Relieving factors: swimming, hot tub,   PRECAUTIONS: None  WEIGHT BEARING RESTRICTIONS No  FALLS:  Has patient fallen in last 6 months? 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 No acute findings.  Hip Xray FINDINGS: Mildly decreased bone mineralization. Minimal superior left femoroacetabular joint space narrowing. Mild superolateral left acetabular degenerative osteophytosis. No significant degenerative change within the right hip, pubic symphysis, or sacroiliac joints. No acute fracture or dislocation.   IMPRESSION: Mild left femoroacetabular osteoarthritis.    PATIENT SURVEYS:  LEFS Lower Extremity Functional Score: 46 / 80 = 57.5 %  POSTURE:  R SB  in standing and with gait  PALPATION: TTP and hypertonicity of L hip flexors, especially in region of TFL and rec fem  Mild L hip joint tightness at end range ER and flexion; springy end feel vs firm  LE ROM:  Passive ROM Right 02/02/2022 Left 02/02/2022  Hip flexion WFL 110  Hip extension WFL 0  Hip  abduction WFL WNL  Hip adduction WFL WNL  Hip internal rotation WFL WNL p!  Hip external rotation [facility] WNL p!   (Blank rows = not tested)  LE MMT:  MMT Right 02/02/2022 Left 02/02/2022  Hip flexion 5/5 4+/5 p!   Hip extension 5/5 4+/5  Hip abduction 5/5 5/5  Hip adduction 5/5 5/5  Knee extension 5/5 5/5   (Blank rows = not tested)  LOWER EXTREMITY SPECIAL TESTS:  Hip special tests: Saralyn Pilar (FABER) test: positive , Trendelenburg test: positive , Ely's test: positive , SI compression test: negative, Hip scouring test: positive , Anterior hip impingement test: positive , and Piriformis test: positive   FUNCTIONAL TESTS:  Step up/step down: 6 stairs, antalgic gait pattern with descending, decreased lowering control, pain with end range hip extension  GAIT: Distance walked: 25f Assistive device utilized: None Level of assistance: Complete Independence Comments: Decreased step length, R SB with gait, antalgic pattern; pain with end range extension    TODAY'S TREATMENT: 5/31 On pool deck:  Manual therapy- IASTM / STM to Lt quad to decrease fascial restrictions and improve mobility.  Standing L Hip flexor stretch x 20 sec  Pt seen for aquatic therapy today.  Treatment took place in water 3.25-4 ft in [facility] Temp of water was 91.  Pt entered/exited the pool via stairs independently with bilat rail.  Warm up: forward and backward walking,  side stepping - cues for even step length Holding yellow noodle: curtsy squats; hip circles with knee flexed;  hip circles CW/CCW with knee straight (focus on hip ext); 3 way hip with knee straight Return to forward / backward walking Standing quad stretch with ankle supported by blue square noodle x 3 reps of 20s on LLE, 1 rep 20s on RLE Pt requires buoyancy for support and to offload joints with strengthening exercises. Viscosity of the water is needed for resistance of strengthening; water current perturbations  provides challenge to standing balance unsupported, requiring increased core activation.  5/25  L rec fem, VL and TFL STM Bridge with GTB at knees 2x10 Standing hip flexor stretch 30s 3x Jefferson curl 5x Hip flexor isometric 5s 10x   5/20: IASTM Lt quads 3-layer heel lift in Left shoe SLS with level pelvis Modified pool: prone flutter kicks, LE curl ins to be in prone in pool but performed in supine on table  EVAL - Supine Bridge  - 2 x daily - 7 x weekly - 2 sets - 10 reps - Prone Quadriceps Stretch with Strap  - 2 x daily - 7 x weekly - 1 sets - 3 reps - 30 hold - Seated Quadratus Lumborum Stretch in Chair  - 2 x daily - 7 x weekly - 1 sets - 3 reps - 30 hold   PATIENT EDUCATION:  Education details: self STM technique, anatomy, exercise progression, DOMS expectations, muscle firing,  envelope of function, HEP, POC   Person educated: Patient Education method: Explanation, Demonstration, Tactile cues, Verbal cues, and Handouts Education comprehension: verbalized understanding, returned demonstration, verbal cues required, and tactile cues required   HOME EXERCISE PROGRAM: Access Code:  [email or web address] URL: [email or web address] Date: 02/02/2022   ASSESSMENT:  CLINICAL IMPRESSION: Palpable tightness in mid to lateral L quad; improved with IASTM/STM. Pt reported greater ease at hip circle today compared to previous time in pool (on own).  She tolerated all exercises well, including standing quad stretch; consider yellow noodle as ankle support next visit. Encouraged pt to be aware of Lt hip position at work and sleep to figure out contributing factors. Consider TPDN to L quad, hip flexor region at next session and adding in mulligan mobilizations if tolerated Pt would benefit from continued skilled therapy in order to reach goals and maximize functional L LE strength and ROM for full return to PLOF.     OBJECTIVE IMPAIRMENTS Abnormal gait, decreased activity  tolerance, decreased mobility, difficulty walking, decreased ROM, decreased strength, hypomobility, increased fascial restrictions, increased muscle spasms, impaired flexibility, impaired sensation, improper body mechanics, postural dysfunction, and pain.   ACTIVITY LIMITATIONS cleaning, community activity, occupation, yard work, shopping, and exercise .   PERSONAL FACTORS Age, Behavior pattern, Past/current experiences, Time since onset of injury/illness/exacerbation, and 1-2 comorbidities:    are also affecting patient's functional outcome.    REHAB POTENTIAL: Good  CLINICAL DECISION MAKING: Stable/uncomplicated  EVALUATION COMPLEXITY: Low   GOALS:  SHORT TERM GOALS: Target date: 03/16/2022   Pt will become independent with HEP in order to demonstrate synthesis of PT education.  Goal status: INITIAL  2.  Pt will be able to demonstrate active hip extension during gait without pain in order to demonstrate functional improvement in LE function for self-care and house hold duties.   Goal status: INITIAL  3.  Pt will report at least 2 pt reduction on NPRS scale for pain during walking in order to demonstrate functional improvement with household activity, self care, and ADL.  Goal status: INITIAL    LONG TERM GOALS: Target date: 04/27/2022    Pt  will become independent with final HEP in order to demonstrate synthesis of PT education.  Goal status: INITIAL  2.  Pt will be able to demonstrate/report ability to walk >40 mins without pain in order to demonstrate functional improvement and tolerance to exercise and community mobility.   Goal status: INITIAL  3.  Pt will have an at least 18 pt improvement in LEFS measure in order to demonstrate MCID improvement in daily function.  Goal status: INITIAL  4.  Pt will be able to demonstrate ability to perform reciprocal stair stepping without pain or discomfort in order to demonstrate functional improvement in LE function for daily  mobility and house hold duties.   Goal status: INITIAL   PLAN: PT FREQUENCY: 1-2x/week  PT DURATION: 12 weeks (likely DC by 8wks)  PLANNED INTERVENTIONS: Therapeutic exercises, Therapeutic activity, Neuromuscular re-education, Balance training, Gait training, Patient/Family education, Joint manipulation, Joint mobilization, Stair training, Orthotic/Fit training, DME instructions, Aquatic Therapy, Dry Needling, Electrical stimulation, Spinal manipulation, Spinal mobilization, Cryotherapy, Moist heat, scar mobilization, Taping, Vasopneumatic device, Traction, Ultrasound, Ionotophoresis '4mg'$ /ml Dexamethasone, Manual therapy, and Re-evaluation  PLAN FOR NEXT SESSION: continue lumbopelvic stability and manual to Lt ant thigh/hip  [employer], PTA 02/17/22 8:02 AM

## 2022-02-18 DIAGNOSIS — F411 Generalized anxiety disorder: Secondary | ICD-10-CM | POA: Diagnosis not present

## 2022-02-18 DIAGNOSIS — F332 Major depressive disorder, recurrent severe without psychotic features: Secondary | ICD-10-CM | POA: Diagnosis not present

## 2022-02-19 ENCOUNTER — Ambulatory Visit (INDEPENDENT_AMBULATORY_CARE_PROVIDER_SITE_OTHER): Payer: 59 | Admitting: Family Medicine

## 2022-02-19 ENCOUNTER — Encounter: Payer: Self-pay | Admitting: Family Medicine

## 2022-02-19 ENCOUNTER — Other Ambulatory Visit (HOSPITAL_COMMUNITY): Payer: Self-pay

## 2022-02-19 VITALS — BP 110/80 | HR 68 | Temp 98.1°F | Ht 61.0 in | Wt 160.0 lb

## 2022-02-19 DIAGNOSIS — Z0000 Encounter for general adult medical examination without abnormal findings: Secondary | ICD-10-CM

## 2022-02-19 DIAGNOSIS — I10 Essential (primary) hypertension: Secondary | ICD-10-CM | POA: Diagnosis not present

## 2022-02-19 DIAGNOSIS — E785 Hyperlipidemia, unspecified: Secondary | ICD-10-CM

## 2022-02-19 LAB — CBC WITH DIFFERENTIAL/PLATELET
Basophils Absolute: 0 10*3/uL (ref 0.0–0.1)
Basophils Relative: 0.7 % (ref 0.0–3.0)
Eosinophils Absolute: 0.2 10*3/uL (ref 0.0–0.7)
Eosinophils Relative: 5.2 % — ABNORMAL HIGH (ref 0.0–5.0)
HCT: 38.6 % (ref 36.0–46.0)
Hemoglobin: 12.9 g/dL (ref 12.0–15.0)
Lymphocytes Relative: 25.5 % (ref 12.0–46.0)
Lymphs Abs: 0.9 10*3/uL (ref 0.7–4.0)
MCHC: 33.3 g/dL (ref 30.0–36.0)
MCV: 82.4 fl (ref 78.0–100.0)
Monocytes Absolute: 0.3 10*3/uL (ref 0.1–1.0)
Monocytes Relative: 8.9 % (ref 3.0–12.0)
Neutro Abs: 2.2 10*3/uL (ref 1.4–7.7)
Neutrophils Relative %: 59.7 % (ref 43.0–77.0)
Platelets: 204 10*3/uL (ref 150.0–400.0)
RBC: 4.68 Mil/uL (ref 3.87–5.11)
RDW: 14.7 % (ref 11.5–15.5)
WBC: 3.6 10*3/uL — ABNORMAL LOW (ref 4.0–10.5)

## 2022-02-19 LAB — LIPID PANEL
Cholesterol: 247 mg/dL — ABNORMAL HIGH (ref 0–200)
HDL: 98.7 mg/dL (ref >39.00)
LDL Cholesterol: 138 mg/dL — ABNORMAL HIGH (ref 0–99)
NonHDL: 148.5
Total CHOL/HDL Ratio: 3
Triglycerides: 53 mg/dL (ref 0.0–149.0)
VLDL: 10.6 mg/dL (ref 0.0–40.0)

## 2022-02-19 LAB — COMPREHENSIVE METABOLIC PANEL
ALT: 19 U/L (ref 0–35)
AST: 19 U/L (ref 0–37)
Albumin: 4.4 g/dL (ref 3.5–5.2)
Alkaline Phosphatase: 59 U/L (ref 39–117)
BUN: 16 mg/dL (ref 6–23)
CO2: 27 mEq/L (ref 19–32)
Calcium: 9.9 mg/dL (ref 8.4–10.5)
Chloride: 101 mEq/L (ref 96–112)
Creatinine, Ser: 0.72 mg/dL (ref 0.40–1.20)
GFR: 89.32 mL/min (ref >60.00)
Glucose, Bld: 70 mg/dL (ref 70–99)
Potassium: 4.1 mEq/L (ref 3.5–5.1)
Sodium: 139 mEq/L (ref 135–145)
Total Bilirubin: 0.4 mg/dL (ref 0.2–1.2)
Total Protein: 7.3 g/dL (ref 6.0–8.3)

## 2022-02-19 LAB — TSH: TSH: 1.65 u[IU]/mL (ref 0.35–5.50)

## 2022-02-19 MED ORDER — PANTOPRAZOLE SODIUM 20 MG PO TBEC
20.0000 mg | DELAYED_RELEASE_TABLET | Freq: Every day | ORAL | 3 refills | Status: DC
  Filled 2022-02-19: qty 90, 90d supply, fill #0
  Filled 2022-05-17: qty 90, 90d supply, fill #1
  Filled 2022-08-02: qty 90, 90d supply, fill #2
  Filled 2022-10-28: qty 90, 90d supply, fill #3

## 2022-02-19 NOTE — Patient Instructions (Addendum)
Sign release of information at the check out desk for PAP smear and dexa/bone density  poor control on famoitidine- restart protonix 20 mg- let us know if fails to control   We will call you within two weeks about your referral for CT cardiac scoring. If you do not hear within 2 weeks, give [place] a call.   Please stop by lab before you go If you have mychart- we will send your results within 3 business days of [place] receiving them.  If you do not have mychart- we will call you about results within 5 business days of [place] receiving them.  *please also note that you will see labs on mychart as soon as they post. I will later go in and write notes on them- will say "notes from Dr. Yong Channel"    Recommended follow up: Return in about 1 year (around 02/20/2023) for physical or sooner if needed.Schedule b4 you leave.

## 2022-02-19 NOTE — Progress Notes (Signed)
Phone [phone]   Subjective:  Patient presents today for their annual physical. Chief complaint-noted.   See problem oriented charting- ROS- full  review of systems was completed and negative except for: some reflux, left hip pain  The following were reviewed and entered/updated in epic: Past Medical History:  Diagnosis Date   Arthritis    Chronic fatigue syndrome    Diverticulosis    External hemorrhoids    Hypertension    Internal hemorrhoids    Neuropathy    Spinal stenosis    Patient Active Problem List   Diagnosis Date Noted   Anxiety 07/24/2020    Priority: Medium    Attention deficit disorder (ADD) in adult 03/31/2020    Priority: Medium    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[place]) 02/15/2020    Priority: Medium    GERD (gastroesophageal reflux disease) 02/15/2020    Priority: Medium    Essential hypertension 02/18/2014    Priority: Medium    Hyperlipidemia 09/18/2012    Priority: Medium    Allergic rhinitis 03/09/2016    Priority: Low   Chronic pain syndrome 02/22/2013    Priority: Low   Chronic insomnia 02/22/2013    Priority: Low   Paresthesias 10/25/2012    Priority: Low   Greater trochanteric bursitis of left hip 01/11/2022   Nonallopathic lesion of cervical region 04/03/2020   Nonallopathic lesion of thoracic region 04/03/2020   Nonallopathic lesion of lumbar region 04/03/2020   Cervical stenosis of spine 03/31/2020   Low bone density 03/17/2020   Past Surgical History:  Procedure Laterality Date   CARDIAC CATHETERIZATION  09/18/2012   LEFT HEART CATHETERIZATION WITH CORONARY ANGIOGRAM N/A 09/18/2012   Procedure: LEFT HEART CATHETERIZATION WITH CORONARY ANGIOGRAM;  Surgeon: Sherren Mocha, MD;  Location: [facility] CATH LAB;  Service: Cardiovascular;  Laterality: N/A;   TONSILLECTOMY  1965    Family History  Problem Relation Age of Onset   Heart attack Maternal Grandmother 22   Arthritis Maternal Grandmother    Hyperlipidemia  Maternal Grandmother    Heart disease Maternal Grandmother    Diabetes Maternal Grandmother    Pancreatic cancer Mother        died 45. 2.5 years past diagnosis.     Anxiety disorder Mother    Depression Mother    Colon polyps Father    Healthy Sister    Healthy Brother    Depression Brother    Heart attack Brother        age 55   Suicidality Maternal Grandfather    Colon cancer Neg Hx    Esophageal cancer Neg Hx    Kidney disease Neg Hx     Medications- reviewed and updated Current Outpatient Medications  Medication Sig Dispense Refill   ACETAMINOPHEN-CAFFEINE PO Take by mouth in the morning.     ARIPiprazole (ABILIFY) 2 MG tablet Take 1/2 to 1 tablet by mouth once daily. 90 tablet 1   clonazePAM (KLONOPIN) 0.5 MG tablet Take 1 tablet (0.5 mg total) by mouth daily as needed. 30 tablet 0   FLUoxetine (PROZAC) 10 MG capsule Take 3 capsules (30 mg total) by mouth daily. 270 capsule 1   hydrochlorothiazide (HYDRODIURIL) 25 MG tablet Take 1 tablet (25 mg total) by mouth daily. 90 tablet 3   lisdexamfetamine (VYVANSE) 20 MG capsule Take 1 capsule by mouth daily. 90 capsule 0   [START ON 04/15/2022] lisdexamfetamine (VYVANSE) 20 MG capsule Take 1 capsule (20 mg total) by mouth daily.  04/15/2022 90 capsule 0  MAGNESIUM CHLORIDE-CALCIUM PO Take by mouth.     Melatonin 3 MG CAPS Take 6 mg by mouth at bedtime.     Probiotic Product (PROBIOTIC DAILY PO) Take by mouth.     No current facility-administered medications for this visit.    Allergies-reviewed and updated Allergies  Allergen Reactions   Eggs Or Egg-Derived Products Other (See Comments)    Makes pt feel like burping up rotten eggs    Social History   Social History Narrative   Married. 2 adopted children- adopted daughter (70) when single after first marriage from [place]. Son from [place] with 2nd husband(18) in 2021. 1st husband local physician Linus Mako      LCSW with [employer]      Hobbies: animal rescue-  has litter of kittens in 2021, walking, time outside   Objective  Objective:  BP 100/60   Pulse 68   Temp 98.1 F (36.7 C)   Ht '5\' 1"'$  (1.549 m)   Wt 160 lb (72.6 kg)   LMP 01/26/2011   SpO2 99%   BMI 30.23 kg/m  Gen: NAD, resting comfortably HEENT: Mucous membranes are moist. Oropharynx normal Neck: no thyromegaly CV: RRR no murmurs rubs or gallops Lungs: CTAB no crackles, wheeze, rhonchi Abdomen: soft/nontender/nondistended/normal bowel sounds. No rebound or guarding.  Ext: no edema Skin: warm, dry Neuro: grossly normal, moves all extremities, PERRLA   Assessment and Plan   61 y.o. female presenting for annual physical.  Health Maintenance counseling: 1. Anticipatory guidance: Patient counseled regarding regular dental exams -q6 months, eye exams - yearly,  avoiding smoking and second hand smoke , limiting alcohol to 1 beverage per day- doesn't drink , no illicit drugs .   2. Risk factor reduction:  Advised patient of need for regular exercise and diet rich and fruits and vegetables to reduce risk of heart attack and stroke.  Exercise- hip affecting her so not able to do walking but doing lap swimming at [place]- she really enjoys this. Wants to get back to walking when able Diet/weight management-weight up some in last year- she states has already started to trend down somewhat- states had some sugar cravings and got up to 170 - as of dec 31 drastically cut down sugar and flour- go into high 150s but the plateauted- encouraged gradual weight loss. Wants to check TSH. Abilify could contribute Wt Readings from Last 3 Encounters:  02/19/22 160 lb (72.6 kg)  02/04/22 159 lb (72.1 kg)  01/15/22 159 lb 6 oz (72.3 kg)  3. Immunizations/screenings/ancillary studies- up to date on covid vaccines - recommend considering in the fall Immunization History  Administered Date(s) Administered   Influenza,inj,Quad PF,6+ Mos 06/19/2013   Influenza-Unspecified 06/29/2020   PFIZER  Comirnaty(Gray Top)Covid-19 Tri-Sucrose Vaccine 01/28/2021   PFIZER(Purple Top)SARS-COV-2 Vaccination 09/25/2019, 10/16/2019   Pfizer Covid-19 Vaccine Bivalent Booster 77yr & up 07/09/2021   Tdap 08/22/2014   Unspecified SARS-COV-2 Vaccination 09/25/2019   Zoster Recombinat (Shingrix) 02/18/2021, 06/23/2021   Zoster, Live 02/18/2021   4. Cervical cancer screening- 06/04/21 completed with Dr. GRunell Gessbut we do not have records 5. Breast cancer screening-  breast exam with GYN- referred last year and mammogram - 12/26/20 and scheduled in june 6. Colon cancer screening - 02/04/22 with 10 year repeat planned- had some issues with bleeding as reason for repeat after 3 years- thankfully reassuring- does have prolapse rectal and referred to PT 7. Skin cancer screening- sees cFrancederm. advised regular sunscreen use. Denies worrisome, changing, or new skin lesions.  8. Birth control/STD check- postmenopause/monogamous  9. Osteoporosis screening at 77-  2016 by physicians for women with mild osteopenia worst t score -1.2 and last  year wanted to have repeat with gyn- reports was updated and told reassuring 10. Smoking associated screening - ner smoker  Status of chronic or acute concerns   #Left hip pain- working with sports medicine and most recently PT with aquatic therapy. Set back on her walking due to her hip. Hip injection was not helpful.   #Rectal prolapse- had updated colonoscopy- doing pelvic floor therapy  #hypertension S: medication: Hydrochlorothiazide 25 mg Home readings #s: 110-120/70-80 BP Readings from Last 3 Encounters:  02/19/22 100/60  02/04/22 110/83  01/15/22 (!) 106/58  A/P: Controlled. Continue current medications.   #hyperlipidemia S: Medication:None.The 10-year ASCVD risk score (Arnett DK, et al., 2019) is: 3% . 50 brother with MI in 89s but different mothers.  -Small vessel disease was noted on MRI of the brain in the past but patient has wanted to work on  lifestyle changes -had myalgias on statin in past- thinks possibly pravastatin Lab Results  Component Value Date   CHOL 235 (H) 02/18/2021   HDL 82.50 02/18/2021   LDLCALC 139 (H) 02/18/2021   TRIG 66.0 02/18/2021   CHOLHDL 3 02/18/2021    A/P: interested in ct cardiac scoring- ordered and update lipids   % # Depression/anxiety/ADD-now managed by psychiatry Thayer Headings, NP S: Medication:Fluoxetine 10 mg - 3 tablets daily for depression and anxiety, Abilify 1 mg for depression (tried to come off but was not able- plummeted into depression off of it and suicidal thoughts), Vyvanse 20 mg for ADD -Also on clonazepam as needed for anxiety -Also on melatonin for sleep -no SI back on abilify    02/19/2022    8:00 AM 02/18/2021    8:04 AM 07/24/2020    1:01 PM  Depression screen PHQ 2/9  Decreased Interest 0 0 0  Down, Depressed, Hopeless 0 0 0  PHQ - 2 Score 0 0 0  Altered sleeping 0  0  Tired, decreased energy 0  0  Change in appetite 0  0  Feeling bad or failure about yourself  0  0  Trouble concentrating 0  0  Moving slowly or fidgety/restless 0  0  Suicidal thoughts 0  0  PHQ-9 Score 0  0  Difficult doing work/chores Not difficult at all    A/P: doing better back on abilify/full remisison- continue current meds   # GERD S:Medication: Protonix 20 mg in the past-now on famotidine 40 mg in the evening but not working  A/P: poor control on famoitidine- restart protonix 20 mg- let us know if fails to control    Recommended follow up: Return in about 1 year (around 02/20/2023) for physical or sooner if needed.Schedule b4 you leave. Future Appointments  Date Time Provider [place]  02/23/2022  1:00 PM Daleen Bo, PT [facility]  03/02/2022  8:00 AM Daleen Bo, PT [facility]  03/03/2022  8:20 AM [facility]  03/04/2022  8:00 AM Daleen Bo, PT [facility]  03/09/2022  8:00 AM Daleen Bo, PT [facility]  03/12/2022  8:00 AM Daleen Bo, PT [facility]  03/16/2022   8:00 AM Daleen Bo, PT [facility]  03/18/2022  8:00 AM Daleen Bo, PT [facility]  03/29/2022  8:00 AM Monico Hoar, PT [facility] None  04/02/2022 10:30 AM [name], DO [facility] None  04/23/2022  8:00 AM  Earlie Counts F, PT [facility] None  04/30/2022  8:00 AM Monico Hoar, PT [facility] None  05/03/2022  1:15 PM Thayer Headings, PMHNP [facility] None   Lab/Order associations: fasting   ICD-10-CM   1. Preventative health care  Z00.00     2. Essential hypertension  I10     3. Hyperlipidemia, unspecified hyperlipidemia type  E78.5       No orders of the defined types were placed in this encounter.   Return precautions advised.  Garret Reddish, MD

## 2022-02-22 ENCOUNTER — Ambulatory Visit: Payer: 59 | Admitting: Family Medicine

## 2022-02-23 ENCOUNTER — Ambulatory Visit (HOSPITAL_BASED_OUTPATIENT_CLINIC_OR_DEPARTMENT_OTHER): Payer: 59 | Attending: Family Medicine | Admitting: Physical Therapy

## 2022-02-23 ENCOUNTER — Encounter (HOSPITAL_BASED_OUTPATIENT_CLINIC_OR_DEPARTMENT_OTHER): Payer: Self-pay | Admitting: Physical Therapy

## 2022-02-23 DIAGNOSIS — M6281 Muscle weakness (generalized): Secondary | ICD-10-CM | POA: Insufficient documentation

## 2022-02-23 DIAGNOSIS — R262 Difficulty in walking, not elsewhere classified: Secondary | ICD-10-CM | POA: Diagnosis not present

## 2022-02-23 DIAGNOSIS — M25552 Pain in left hip: Secondary | ICD-10-CM | POA: Diagnosis not present

## 2022-02-23 NOTE — Therapy (Signed)
OUTPATIENT PHYSICAL THERAPY LOWER EXTREMITY Treatment   Patient Name: Tammy [name] MRN: [id number] DOB:1960/11/18, 61 y.o., female Today's Date: 02/23/2022   PT End of Session - 02/23/22 1345     Visit Number 5    Number of Visits 16    Date for PT Re-Evaluation 05/03/22    Authorization Type [employer]    PT Start Time 1302    PT Stop Time 1342    PT Time Calculation (min) 40 min    Activity Tolerance Patient tolerated treatment well    Behavior During Therapy [facility] for tasks assessed/performed                Past Medical History:  Diagnosis Date   Arthritis    Chronic fatigue syndrome    Diverticulosis    External hemorrhoids    Hypertension    Internal hemorrhoids    Neuropathy    Spinal stenosis    Past Surgical History:  Procedure Laterality Date   CARDIAC CATHETERIZATION  09/18/2012   LEFT HEART CATHETERIZATION WITH CORONARY ANGIOGRAM N/A 09/18/2012   Procedure: LEFT HEART CATHETERIZATION WITH CORONARY ANGIOGRAM;  Surgeon: Sherren Mocha, MD;  Location: [facility] CATH LAB;  Service: Cardiovascular;  Laterality: N/A;   TONSILLECTOMY  1965   Patient Active Problem List   Diagnosis Date Noted   Greater trochanteric bursitis of left hip 01/11/2022   Anxiety 07/24/2020   Nonallopathic lesion of cervical region 04/03/2020   Nonallopathic lesion of thoracic region 04/03/2020   Nonallopathic lesion of lumbar region 04/03/2020   Cervical stenosis of spine 03/31/2020   Attention deficit disorder (ADD) in adult 03/31/2020   Low bone density 03/17/2020   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[place]) 02/15/2020   GERD (gastroesophageal reflux disease) 02/15/2020   Allergic rhinitis 03/09/2016   Essential hypertension 02/18/2014   Chronic pain syndrome 02/22/2013   Chronic insomnia 02/22/2013   Paresthesias 10/25/2012   Hyperlipidemia 09/18/2012    PCP: [name], MD  REFERRING PROVIDER: [name], DO  REFERRING DIAG: [phone] (ICD-10-CM) -  Left hip pain  THERAPY DIAG:  Pain in left hip  Difficulty walking  Muscle weakness (generalized)  ONSET DATE: Fall 2022  SUBJECTIVE:   SUBJECTIVE STATEMENT: Pt states that she felt better after the STM but sitting in a booth for 3 hours has caused more pain. She feels like that entire L lateral quad was painful. She states she had no issues with aquatic therapy.   PERTINENT HISTORY: Depression, spinal stenosis, HTN, chronic pain  PAIN:  Are you having pain? Yes: NPRS scale: 2/10 Pain location: lower back, ant thigh, posterior hip Pain description: "tight Aggravating factors: walking, hip ER, stairs, crossing the legs  Relieving factors: swimming, hot tub,   PRECAUTIONS: None  WEIGHT BEARING RESTRICTIONS No  FALLS:  Has patient fallen in last 6 months? 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 No acute findings.  Hip Xray FINDINGS: Mildly decreased bone mineralization. Minimal superior left femoroacetabular joint space narrowing. Mild superolateral left acetabular degenerative osteophytosis. No significant degenerative change within the right hip, pubic symphysis, or sacroiliac joints. No acute fracture or dislocation.   IMPRESSION: Mild left femoroacetabular osteoarthritis.  PATIENT SURVEYS:  LEFS Lower Extremity Functional Score: 46 / 80 = 57.5 %  POSTURE:  R SB in standing and with gait  PALPATION: TTP and hypertonicity of L hip flexors, especially in region of TFL and rec fem  Mild L hip joint tightness at end range ER and flexion; springy end feel vs firm  LE  ROM:  Passive ROM Right 02/02/2022 Left 02/02/2022  Hip flexion WFL 110  Hip extension WFL 0  Hip abduction WFL WNL  Hip adduction WFL WNL  Hip internal rotation WFL WNL p!  Hip external rotation [facility] WNL p!   (Blank rows = not tested)  LE MMT:  MMT Right 02/02/2022 Left 02/02/2022  Hip flexion 5/5 4+/5 p!   Hip extension 5/5 4+/5  Hip abduction 5/5 5/5  Hip adduction 5/5 5/5  Knee extension 5/5 5/5   (Blank rows = not tested)  LOWER EXTREMITY SPECIAL TESTS:  Hip special tests: Saralyn Pilar (FABER) test: positive , Trendelenburg test: positive , Ely's test: positive , SI compression test: negative, Hip scouring test: positive , Anterior hip impingement test: positive , and Piriformis test: positive   FUNCTIONAL TESTS:  Step up/step down: 6 stairs, antalgic gait pattern with descending, decreased lowering control, pain with end range hip extension  GAIT: Distance walked: 45f Assistive device utilized: None Level of assistance: Complete Independence Comments: Decreased step length, R SB with gait, antalgic pattern; pain with end range extension    TODAY'S TREATMENT: 6/6  L rec fem, VL and TFL STM;  Mulligan belt mob grade III lateral and inf, IR/ER mob with movement grade III, LAD grade III with ABD/ADD  Standing HS stretch 30s 3x Standing hip flexor stretch 30s 2x Standing lunge stretch to R 30s 3x   5/31 On pool deck:  Manual therapy- IASTM / STM to Lt quad to decrease fascial restrictions and improve mobility.  Standing L Hip flexor stretch x 20 sec  Pt seen for aquatic therapy today.  Treatment took place in water 3.25-[facility] Temp of water was 91.  Pt entered/exited the pool via stairs independently with bilat rail.  Warm up: forward and backward walking,  side stepping - cues for even step length Holding yellow noodle: curtsy squats; hip circles with knee flexed;  hip circles CW/CCW with knee straight (focus on hip ext); 3  way hip with knee straight Return to forward / backward walking Standing quad stretch with ankle supported by blue square noodle x 3 reps of 20s on LLE, 1 rep 20s on RLE Pt requires buoyancy for support and to offload joints with strengthening exercises. Viscosity of the water is needed for resistance of strengthening; water current perturbations provides challenge to standing balance unsupported, requiring increased core activation.  5/25  L rec fem, VL and TFL STM Bridge with GTB at knees 2x10 Standing hip flexor stretch 30s 3x Jefferson curl 5x Hip flexor isometric 5s 10x   5/20: IASTM Lt quads 3-layer heel lift in Left shoe SLS with level pelvis Modified pool: prone flutter kicks, LE curl ins to be in prone in pool but performed in supine on table  EVAL - Supine Bridge  - 2 x daily - 7 x weekly - 2 sets - 10 reps - Prone Quadriceps Stretch with Strap  - 2 x daily - 7 x weekly - 1 sets - 3 reps - 30 hold - Seated Quadratus Lumborum Stretch in Chair  - 2 x daily - 7  x weekly - 1 sets - 3 reps - 30 hold   PATIENT EDUCATION:  Education details: self STM technique, anatomy, exercise progression, DOMS expectations, muscle firing,  envelope of function, HEP, POC   Person educated: Patient Education method: Explanation, Demonstration, Tactile cues, Verbal cues, and Handouts Education comprehension: verbalized understanding, returned demonstration, verbal cues required, and tactile cues required   HOME EXERCISE PROGRAM: Access Code: [email or web address] URL: [email or web address] Date: 02/02/2022   ASSESSMENT:  CLINICAL IMPRESSION: Pt with improvement in hip extension during gait following manual therapy and exercise. Pt began session with significant L quad spasm and tightness likely from period of extended static positioning. Pt with report of decreased pain and improvement hip ROM following stretching.Plan to continue with joint mobilizations, STM, and generalized L hip  stretching. Consider TPDN PRN for L quad/hip. Pt would benefit from continued skilled therapy in order to reach goals and maximize functional L LE strength and ROM for full return to PLOF.     OBJECTIVE IMPAIRMENTS Abnormal gait, decreased activity tolerance, decreased mobility, difficulty walking, decreased ROM, decreased strength, hypomobility, increased fascial restrictions, increased muscle spasms, impaired flexibility, impaired sensation, improper body mechanics, postural dysfunction, and pain.   ACTIVITY LIMITATIONS cleaning, community activity, occupation, yard work, shopping, and exercise .   PERSONAL FACTORS Age, Behavior pattern, Past/current experiences, Time since onset of injury/illness/exacerbation, and 1-2 comorbidities:    are also affecting patient's functional outcome.    REHAB POTENTIAL: Good  CLINICAL DECISION MAKING: Stable/uncomplicated  EVALUATION COMPLEXITY: Low   GOALS:  SHORT TERM GOALS: Target date: 03/16/2022   Pt will become independent with HEP in order to demonstrate synthesis of PT education.  Goal status: INITIAL  2.  Pt will be able to demonstrate active hip extension during gait without pain in order to demonstrate functional improvement in LE function for self-care and house hold duties.   Goal status: INITIAL  3.  Pt will report at least 2 pt reduction on NPRS scale for pain during walking in order to demonstrate functional improvement with household activity, self care, and ADL.  Goal status: INITIAL    LONG TERM GOALS: Target date: 04/27/2022    Pt  will become independent with final HEP in order to demonstrate synthesis of PT education.  Goal status: INITIAL  2.  Pt will be able to demonstrate/report ability to walk >40 mins without pain in order to demonstrate functional improvement and tolerance to exercise and community mobility.   Goal status: INITIAL  3.  Pt will have an at least 18 pt improvement in LEFS measure in order to  demonstrate MCID improvement in daily function.  Goal status: INITIAL  4.  Pt will be able to demonstrate ability to perform reciprocal stair stepping without pain or discomfort in order to demonstrate functional improvement in LE function for daily mobility and house hold duties.   Goal status: INITIAL   PLAN: PT FREQUENCY: 1-2x/week  PT DURATION: 12 weeks (likely DC by 8wks)  PLANNED INTERVENTIONS: Therapeutic exercises, Therapeutic activity, Neuromuscular re-education, Balance training, Gait training, Patient/Family education, Joint manipulation, Joint mobilization, Stair training, Orthotic/Fit training, DME instructions, Aquatic Therapy, Dry Needling, Electrical stimulation, Spinal manipulation, Spinal mobilization, Cryotherapy, Moist heat, scar mobilization, Taping, Vasopneumatic device, Traction, Ultrasound, Ionotophoresis '4mg'$ /ml Dexamethasone, Manual therapy, and Re-evaluation  PLAN FOR NEXT SESSION: continue lumbopelvic stability and manual to Lt ant thigh/hip  Daleen Bo PT, DPT 02/23/22 1:46 PM

## 2022-03-02 ENCOUNTER — Encounter (HOSPITAL_BASED_OUTPATIENT_CLINIC_OR_DEPARTMENT_OTHER): Payer: Self-pay | Admitting: Physical Therapy

## 2022-03-02 ENCOUNTER — Ambulatory Visit (HOSPITAL_BASED_OUTPATIENT_CLINIC_OR_DEPARTMENT_OTHER): Payer: 59 | Admitting: Physical Therapy

## 2022-03-02 DIAGNOSIS — M6281 Muscle weakness (generalized): Secondary | ICD-10-CM | POA: Diagnosis not present

## 2022-03-02 DIAGNOSIS — M25552 Pain in left hip: Secondary | ICD-10-CM

## 2022-03-02 DIAGNOSIS — R262 Difficulty in walking, not elsewhere classified: Secondary | ICD-10-CM

## 2022-03-02 NOTE — Therapy (Signed)
OUTPATIENT PHYSICAL THERAPY LOWER EXTREMITY Treatment   Patient Name: Tammy [name] MRN: [id number] DOB:[birth year], 61 y.o., female Today's Date: 03/02/2022   PT End of Session - 03/02/22 0808     Visit Number 6    Number of Visits 16    Date for PT Re-Evaluation 05/03/22    Authorization Type [employer]    PT Start Time 0801    PT Stop Time 0841    PT Time Calculation (min) 40 min    Activity Tolerance Patient tolerated treatment well    Behavior During Therapy [facility] for tasks assessed/performed                 Past Medical History:  Diagnosis Date   Arthritis    Chronic fatigue syndrome    Diverticulosis    External hemorrhoids    Hypertension    Internal hemorrhoids    Neuropathy    Spinal stenosis    Past Surgical History:  Procedure Laterality Date   CARDIAC CATHETERIZATION  09/18/2012   LEFT HEART CATHETERIZATION WITH CORONARY ANGIOGRAM N/A 09/18/2012   Procedure: LEFT HEART CATHETERIZATION WITH CORONARY ANGIOGRAM;  Surgeon: Sherren Mocha, MD;  Location: [facility] CATH LAB;  Service: Cardiovascular;  Laterality: N/A;   TONSILLECTOMY  1965   Patient Active Problem List   Diagnosis Date Noted   Greater trochanteric bursitis of left hip 01/11/2022   Anxiety 07/24/2020   Nonallopathic lesion of cervical region 04/03/2020   Nonallopathic lesion of thoracic region 04/03/2020   Nonallopathic lesion of lumbar region 04/03/2020   Cervical stenosis of spine 03/31/2020   Attention deficit disorder (ADD) in adult 03/31/2020   Low bone density 03/17/2020   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[place]) 02/15/2020   GERD (gastroesophageal reflux disease) 02/15/2020   Allergic rhinitis 03/09/2016   Essential hypertension 02/18/2014   Chronic pain syndrome 02/22/2013   Chronic insomnia 02/22/2013   Paresthesias 10/25/2012   Hyperlipidemia 09/18/2012    PCP: [name], MD  REFERRING PROVIDER: [name], DO  REFERRING DIAG: [phone] (ICD-10-CM)  - Left hip pain  THERAPY DIAG:  Pain in left hip  Difficulty walking  Muscle weakness (generalized)  ONSET DATE: Fall 2022  SUBJECTIVE:   SUBJECTIVE STATEMENT: Pt states that manual helped after last session that lasted for a few days. Pt states she was swimming this morning without issue. She states her husband noticed her limping into the pool.   PERTINENT HISTORY: Depression, spinal stenosis, HTN, chronic pain  PAIN:  Are you having pain? No: NPRS scale: 0/10 Pain location: lower back, ant thigh, posterior hip Pain description: "tight Aggravating factors: walking, hip ER, stairs, crossing the legs  Relieving factors: swimming, hot tub,   PRECAUTIONS: None  WEIGHT BEARING RESTRICTIONS No  FALLS:  Has patient fallen in last 6 months? 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 No acute findings.  Hip Xray FINDINGS: Mildly decreased bone mineralization. Minimal superior left femoroacetabular joint space narrowing. Mild superolateral left acetabular degenerative osteophytosis. No significant degenerative change within the right hip, pubic symphysis, or sacroiliac joints. No acute fracture or dislocation.   IMPRESSION: Mild left femoroacetabular osteoarthritis.    PATIENT SURVEYS:  LEFS Lower Extremity Functional Score: 46 / 80 = 57.5 %  POSTURE:  R SB in standing and with gait  PALPATION: TTP and hypertonicity of L hip flexors, especially in region of TFL and rec fem  Mild L hip joint tightness at end range ER and flexion; springy end feel vs firm  LE ROM:  Passive  ROM Right 02/02/2022 Left 02/02/2022  Hip flexion WFL 110  Hip extension WFL 0  Hip abduction WFL WNL  Hip adduction WFL WNL  Hip internal rotation WFL WNL p!  Hip external rotation [facility] WNL p!   (Blank rows = not tested)  LE MMT:  MMT Right 02/02/2022 Left 02/02/2022  Hip flexion 5/5 4+/5 p!   Hip extension 5/5 4+/5  Hip abduction 5/5 5/5  Hip adduction 5/5 5/5  Knee extension 5/5 5/5   (Blank rows = not tested)  LOWER EXTREMITY SPECIAL TESTS:  Hip special tests: Saralyn Pilar (FABER) test: positive , Trendelenburg test: positive , Ely's test: positive , SI compression test: negative, Hip scouring test: positive , Anterior hip impingement test: positive , and Piriformis test: positive   FUNCTIONAL TESTS:  Step up/step down: 6 stairs, antalgic gait pattern with descending, decreased lowering control, pain with end range hip extension    TODAY'S TREATMENT:  6/13  L rec fem, VL and TFL STM;   DKTC 5s 10x PPT 2s 10x Figure 4 bridge 2x10 each Jefferson curl 6x Figure 4 supine stretch (hands through window) 30s 3x Sidestepping GTB at ankles with ab brace 86f x2   6/6  L rec fem, VL and TFL STM;  Mulligan belt mob grade III lateral and inf, IR/ER mob with movement grade III, LAD grade III with ABD/ADD  Standing HS stretch 30s 3x Standing hip flexor stretch 30s 2x Standing lunge stretch to R 30s 3x   5/31 On pool deck:  Manual therapy- IASTM / STM to Lt quad to decrease fascial restrictions and improve mobility.  Standing L Hip flexor stretch x 20 sec  Pt seen for aquatic therapy today.  Treatment took place in water 3.25-4 ft [facility] Temp of water was 91.  Pt entered/exited the pool via stairs independently with bilat rail.  Warm up: forward and backward walking,  side stepping - cues for even step length Holding yellow noodle: curtsy squats; hip circles with knee flexed;  hip circles CW/CCW with knee straight (focus on hip ext); 3 way  hip with knee straight Return to forward / backward walking Standing quad stretch with ankle supported by blue square noodle x 3 reps of 20s on LLE, 1 rep 20s on RLE Pt requires buoyancy for support and to offload joints with strengthening exercises. Viscosity of the water is needed for resistance of strengthening; water current perturbations provides challenge to standing balance unsupported, requiring increased core activation.  5/25  L rec fem, VL and TFL STM Bridge with GTB at knees 2x10 Standing hip flexor stretch 30s 3x Jefferson curl 5x Hip flexor isometric 5s 10x   5/20: IASTM Lt quads 3-layer heel lift in Left shoe SLS with level pelvis Modified pool: prone flutter kicks, LE curl ins to be in prone in pool but performed in supine on table  EVAL - Supine Bridge  - 2 x daily - 7 x weekly - 2 sets - 10 reps - Prone Quadriceps Stretch with Strap  - 2 x daily - 7 x weekly - 1 sets - 3 reps - 30 hold -  Seated Quadratus Lumborum Stretch in Chair  - 2 x daily - 7 x weekly - 1 sets - 3 reps - 30 hold   PATIENT EDUCATION:  Education details: exercise progression, muscle firing,  envelope of function, HEP, POC   Person educated: Patient Education method: Explanation, Demonstration, Tactile cues, Verbal cues, and Handouts Education comprehension: verbalized understanding, returned demonstration, verbal cues required, and tactile cues required   HOME EXERCISE PROGRAM: Access Code: [email or web address] URL: [email or web address] Date: 02/02/2022   ASSESSMENT:  CLINICAL IMPRESSION: Given no significant change in quad spasm between visits, approach changed to focus more on L hip and L/S. Pt responded well to PPT and spinal flexion based movements. With single leg bridging, pt notes less engagement with the L glute and more QL tightening. Pt has more L sided L/S stiff compared to R with flexion movements. Plan to continue with lumbar approach. HEP modified at this time. Pt would  benefit from continued skilled therapy in order to reach goals and maximize functional L LE strength and ROM for full return to PLOF.     OBJECTIVE IMPAIRMENTS Abnormal gait, decreased activity tolerance, decreased mobility, difficulty walking, decreased ROM, decreased strength, hypomobility, increased fascial restrictions, increased muscle spasms, impaired flexibility, impaired sensation, improper body mechanics, postural dysfunction, and pain.   ACTIVITY LIMITATIONS cleaning, community activity, occupation, yard work, shopping, and exercise .   PERSONAL FACTORS Age, Behavior pattern, Past/current experiences, Time since onset of injury/illness/exacerbation, and 1-2 comorbidities:    are also affecting patient's functional outcome.    REHAB POTENTIAL: Good  CLINICAL DECISION MAKING: Stable/uncomplicated  EVALUATION COMPLEXITY: Low   GOALS:  SHORT TERM GOALS: Target date: 03/16/2022   Pt will become independent with HEP in order to demonstrate synthesis of PT education.  Goal status: INITIAL  2.  Pt will be able to demonstrate active hip extension during gait without pain in order to demonstrate functional improvement in LE function for self-care and house hold duties.   Goal status: INITIAL  3.  Pt will report at least 2 pt reduction on NPRS scale for pain during walking in order to demonstrate functional improvement with household activity, self care, and ADL.  Goal status: INITIAL    LONG TERM GOALS: Target date: 04/27/2022    Pt  will become independent with final HEP in order to demonstrate synthesis of PT education.  Goal status: INITIAL  2.  Pt will be able to demonstrate/report ability to walk >40 mins without pain in order to demonstrate functional improvement and tolerance to exercise and community mobility.   Goal status: INITIAL  3.  Pt will have an at least 18 pt improvement in LEFS measure in order to demonstrate MCID improvement in daily function.  Goal  status: INITIAL  4.  Pt will be able to demonstrate ability to perform reciprocal stair stepping without pain or discomfort in order to demonstrate functional improvement in LE function for daily mobility and house hold duties.   Goal status: INITIAL   PLAN: PT FREQUENCY: 1-2x/week  PT DURATION: 12 weeks (likely DC by 8wks)  PLANNED INTERVENTIONS: Therapeutic exercises, Therapeutic activity, Neuromuscular re-education, Balance training, Gait training, Patient/Family education, Joint manipulation, Joint mobilization, Stair training, Orthotic/Fit training, DME instructions, Aquatic Therapy, Dry Needling, Electrical stimulation, Spinal manipulation, Spinal mobilization, Cryotherapy, Moist heat, scar mobilization, Taping, Vasopneumatic device, Traction, Ultrasound, Ionotophoresis '4mg'$ /ml Dexamethasone, Manual therapy, and Re-evaluation  PLAN FOR NEXT SESSION: continue lumbopelvic stability and manual to Lt ant thigh/hip  Daleen Bo PT, DPT 03/02/22 8:44 AM

## 2022-03-03 ENCOUNTER — Encounter (HOSPITAL_BASED_OUTPATIENT_CLINIC_OR_DEPARTMENT_OTHER): Payer: Self-pay

## 2022-03-03 ENCOUNTER — Ambulatory Visit (HOSPITAL_BASED_OUTPATIENT_CLINIC_OR_DEPARTMENT_OTHER)
Admission: RE | Admit: 2022-03-03 | Discharge: 2022-03-03 | Disposition: A | Discharge status: Home / Self Care | Payer: 59 | Source: Ambulatory Visit | Attending: Family Medicine | Admitting: Family Medicine

## 2022-03-03 DIAGNOSIS — Z1231 Encounter for screening mammogram for malignant neoplasm of breast: Secondary | ICD-10-CM | POA: Diagnosis not present

## 2022-03-04 ENCOUNTER — Encounter (HOSPITAL_BASED_OUTPATIENT_CLINIC_OR_DEPARTMENT_OTHER): Payer: Self-pay | Admitting: Physical Therapy

## 2022-03-04 ENCOUNTER — Ambulatory Visit (HOSPITAL_BASED_OUTPATIENT_CLINIC_OR_DEPARTMENT_OTHER): Payer: 59 | Admitting: Physical Therapy

## 2022-03-04 DIAGNOSIS — R262 Difficulty in walking, not elsewhere classified: Secondary | ICD-10-CM

## 2022-03-04 DIAGNOSIS — F411 Generalized anxiety disorder: Secondary | ICD-10-CM | POA: Diagnosis not present

## 2022-03-04 DIAGNOSIS — M25552 Pain in left hip: Secondary | ICD-10-CM

## 2022-03-04 DIAGNOSIS — M6281 Muscle weakness (generalized): Secondary | ICD-10-CM

## 2022-03-04 DIAGNOSIS — F332 Major depressive disorder, recurrent severe without psychotic features: Secondary | ICD-10-CM | POA: Diagnosis not present

## 2022-03-04 NOTE — Therapy (Signed)
OUTPATIENT PHYSICAL THERAPY LOWER EXTREMITY Treatment   Patient Name: Tammy [name] MRN: [id number] DOB:[birth year], 61 y.o., female Today's Date: 03/04/2022   PT End of Session - 03/04/22 0848     Visit Number 7    Number of Visits 16    Date for PT Re-Evaluation 05/03/22    Authorization Type [employer]    PT Start Time 0803    PT Stop Time 0843    PT Time Calculation (min) 40 min    Activity Tolerance Patient tolerated treatment well    Behavior During Therapy [facility] for tasks assessed/performed                  Past Medical History:  Diagnosis Date   Arthritis    Chronic fatigue syndrome    Diverticulosis    External hemorrhoids    Hypertension    Internal hemorrhoids    Neuropathy    Spinal stenosis    Past Surgical History:  Procedure Laterality Date   CARDIAC CATHETERIZATION  09/18/2012   LEFT HEART CATHETERIZATION WITH CORONARY ANGIOGRAM N/A 09/18/2012   Procedure: LEFT HEART CATHETERIZATION WITH CORONARY ANGIOGRAM;  Surgeon: Sherren Mocha, MD;  Location: [facility] CATH LAB;  Service: Cardiovascular;  Laterality: N/A;   TONSILLECTOMY  1965   Patient Active Problem List   Diagnosis Date Noted   Greater trochanteric bursitis of left hip 01/11/2022   Anxiety 07/24/2020   Nonallopathic lesion of cervical region 04/03/2020   Nonallopathic lesion of thoracic region 04/03/2020   Nonallopathic lesion of lumbar region 04/03/2020   Cervical stenosis of spine 03/31/2020   Attention deficit disorder (ADD) in adult 03/31/2020   Low bone density 03/17/2020   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[place]) 02/15/2020   GERD (gastroesophageal reflux disease) 02/15/2020   Allergic rhinitis 03/09/2016   Essential hypertension 02/18/2014   Chronic pain syndrome 02/22/2013   Chronic insomnia 02/22/2013   Paresthesias 10/25/2012   Hyperlipidemia 09/18/2012    PCP: [name], MD  REFERRING PROVIDER: [name], DO  REFERRING DIAG: [phone]  (ICD-10-CM) - Left hip pain  THERAPY DIAG:  Pain in left hip  Difficulty walking  Muscle weakness (generalized)  ONSET DATE: Fall 2022  SUBJECTIVE:   SUBJECTIVE STATEMENT: Pt states that she is generally feeling "rough" due to longer work day yesterday. She states that swimming this morning just felt like more effort as well.   Pt states she walked 20 mins on Tuesday that felt okay but progressively worsening pain.   PERTINENT HISTORY: Depression, spinal stenosis, HTN, chronic pain  PAIN:  Are you having pain? No: NPRS scale: 0/10 Pain location: lower back, ant thigh, posterior hip Pain description: "tight Aggravating factors: walking, hip ER, stairs, crossing the legs  Relieving factors: swimming, hot tub,   PRECAUTIONS: None  WEIGHT BEARING RESTRICTIONS No  FALLS:  Has patient fallen in last 6 months? 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 No acute findings.  Hip Xray FINDINGS: Mildly decreased bone mineralization. Minimal superior left femoroacetabular joint space narrowing. Mild superolateral left acetabular degenerative osteophytosis. No significant degenerative change within the right hip, pubic symphysis, or sacroiliac joints. No acute fracture or dislocation.  IMPRESSION: Mild left femoroacetabular osteoarthritis.    PATIENT SURVEYS:  LEFS Lower Extremity Functional Score: 46 / 80 = 57.5 %  POSTURE:  R SB in standing and with gait  PALPATION: TTP and hypertonicity of L hip flexors, especially in region of TFL and rec fem  Mild L hip joint tightness at  end range ER and flexion; springy end feel vs firm  LE ROM:  Passive ROM Right 02/02/2022 Left 02/02/2022  Hip flexion WFL 110  Hip extension WFL 0  Hip abduction WFL WNL  Hip adduction WFL WNL  Hip internal rotation WFL WNL p!  Hip external rotation [facility] WNL p!   (Blank rows = not tested)  LE MMT:  MMT Right 02/02/2022 Left 02/02/2022  Hip flexion 5/5 4+/5 p!   Hip extension 5/5 4+/5  Hip abduction 5/5 5/5  Hip adduction 5/5 5/5  Knee extension 5/5 5/5   (Blank rows = not tested)  LOWER EXTREMITY SPECIAL TESTS:  Hip special tests: Saralyn Pilar (FABER) test: positive , Trendelenburg test: positive , Ely's test: positive , SI compression test: negative, Hip scouring test: positive , Anterior hip impingement test: positive , and Piriformis test: positive   FUNCTIONAL TESTS:  Step up/step down: 6 stairs, antalgic gait pattern with descending, decreased lowering control, pain with end range hip extension    TODAY'S TREATMENT:  6/13  L rec fem, VL and TFL STM;  L hip Mulligan belt mob grade III lateral and inf, IR/ER mob with movement grade IV  DKTC 5s 10x PPT 2s 10x Standing L hip flexor stretch 30s 3x with trunk SB away  Thomas stretch edge of bed with strap 30s 3x Figure 4 bridge 2x10 each Jefferson curl 6x Figure 4 supine stretch (hands through window) 30s 3x Standing HS stretch 30s 3x   6/6  L rec fem, VL and TFL STM;  Mulligan belt mob grade III lateral and inf, IR/ER mob with movement grade III, LAD grade III with ABD/ADD  Standing HS stretch 30s 3x Standing hip flexor stretch 30s 2x Standing lunge stretch to R 30s 3x   5/31 On pool deck:  Manual therapy- IASTM / STM to Lt quad to decrease fascial restrictions and improve mobility.  Standing L Hip flexor stretch x 20 sec  Pt seen for aquatic therapy today.  Treatment took place in water 3.25-4 ft in depth at the [facility] pool. Temp of water was 91.  Pt entered/exited the pool via stairs  independently with bilat rail.  Warm up: forward and backward walking,  side stepping - cues for even step length Holding yellow noodle: curtsy squats; hip circles with knee flexed;  hip circles CW/CCW with knee straight (focus on hip ext); 3 way hip with knee straight Return to forward / backward walking Standing quad stretch with ankle supported by blue square noodle x 3 reps of 20s on LLE, 1 rep 20s on RLE Pt requires buoyancy for support and to offload joints with strengthening exercises. Viscosity of the water is needed for resistance of strengthening; water current perturbations provides challenge to standing balance unsupported, requiring increased core activation.  5/25  L rec fem, VL and TFL STM Bridge with GTB at knees 2x10 Standing hip flexor stretch 30s 3x Jefferson curl 5x Hip flexor isometric 5s 10x   5/20: IASTM Lt quads 3-layer heel lift in Left shoe SLS with level pelvis Modified pool: prone flutter kicks, LE curl ins to be in prone in pool but performed in supine on table  EVAL - Supine Bridge  - 2 x daily - 7 x weekly - 2 sets - 10 reps - Prone Quadriceps Stretch with Strap  - 2 x daily - 7 x weekly - 1 sets - 3 reps - 30 hold - Seated Quadratus Lumborum Stretch in Chair  - 2 x daily - 7 x weekly - 1 sets - 3 reps - 30 hold   PATIENT EDUCATION:  Education details: exercise progression, muscle firing,  envelope of function, HEP, POC   Person educated: Patient Education method: Explanation, Demonstration, Tactile cues, Verbal cues, and Handouts Education comprehension: verbalized understanding, returned demonstration, verbal cues required, and tactile cues required   HOME EXERCISE PROGRAM: Access Code: [email or web address] URL: [email or web address] Date: 02/02/2022   ASSESSMENT:  CLINICAL IMPRESSION: Pt found good relief of pain today with L hip joint mobilization, stiffness was noted with inf glides today that may be related to static positioning  during work day. Pt with improvement in hip flexion and extension with gait following joint mobilization and stretching. HEP modified at this time. Continue with L lumbopelvic approach with L hip mobilizations. S/s do continue to appear consistent with FAI and OA. Pt would benefit from continued skilled therapy in order to reach goals and maximize functional L LE strength and ROM for full return to PLOF.     OBJECTIVE IMPAIRMENTS Abnormal gait, decreased activity tolerance, decreased mobility, difficulty walking, decreased ROM, decreased strength, hypomobility, increased fascial restrictions, increased muscle spasms, impaired flexibility, impaired sensation, improper body mechanics, postural dysfunction, and pain.   ACTIVITY LIMITATIONS cleaning, community activity, occupation, yard work, shopping, and exercise .   PERSONAL FACTORS Age, Behavior pattern, Past/current experiences, Time since onset of injury/illness/exacerbation, and 1-2 comorbidities:    are also affecting patient's functional outcome.    REHAB POTENTIAL: Good  CLINICAL DECISION MAKING: Stable/uncomplicated  EVALUATION COMPLEXITY: Low   GOALS:  SHORT TERM GOALS: Target date: 03/16/2022   Pt will become independent with HEP in order to demonstrate synthesis of PT education.  Goal status: INITIAL  2.  Pt will be able to demonstrate active hip extension during gait without pain in order to demonstrate functional improvement in LE function for self-care and house hold duties.   Goal status: INITIAL  3.  Pt will report at least 2 pt reduction on NPRS scale for pain during walking in order to demonstrate functional improvement with household activity, self care, and ADL.  Goal status: INITIAL    LONG TERM GOALS: Target date: 04/27/2022    Pt  will become independent with final HEP in order to demonstrate synthesis of PT education.  Goal status: INITIAL  2.  Pt will be able to demonstrate/report ability to walk >40  mins without pain in order to demonstrate functional improvement and tolerance to exercise and community mobility.   Goal status: INITIAL  3.  Pt will have an at least 18 pt improvement in LEFS measure in order to demonstrate MCID improvement in daily function.  Goal status: INITIAL  4.  Pt will be able to demonstrate ability to perform reciprocal stair stepping without pain or discomfort in order to demonstrate functional improvement in LE function for daily mobility and house hold duties.   Goal status: INITIAL   PLAN: PT FREQUENCY: 1-2x/week  PT DURATION: 12 weeks (likely DC by 8wks)  PLANNED INTERVENTIONS: Therapeutic exercises, Therapeutic activity, Neuromuscular re-education, Balance training, Gait training, Patient/Family education, Joint manipulation, Joint mobilization, Stair training, Orthotic/Fit training, DME instructions, Aquatic Therapy, Dry Needling, Electrical stimulation, Spinal  manipulation, Spinal mobilization, Cryotherapy, Moist heat, scar mobilization, Taping, Vasopneumatic device, Traction, Ultrasound, Ionotophoresis '4mg'$ /ml Dexamethasone, Manual therapy, and Re-evaluation  PLAN FOR NEXT SESSION: continue lumbopelvic stability and manual to Lt ant thigh/hip  Daleen Bo PT, DPT 03/04/22 9:01 AM

## 2022-03-09 ENCOUNTER — Encounter (HOSPITAL_BASED_OUTPATIENT_CLINIC_OR_DEPARTMENT_OTHER): Payer: Self-pay | Admitting: Physical Therapy

## 2022-03-09 ENCOUNTER — Ambulatory Visit (HOSPITAL_BASED_OUTPATIENT_CLINIC_OR_DEPARTMENT_OTHER): Payer: 59 | Admitting: Physical Therapy

## 2022-03-09 DIAGNOSIS — R262 Difficulty in walking, not elsewhere classified: Secondary | ICD-10-CM

## 2022-03-09 DIAGNOSIS — M25552 Pain in left hip: Secondary | ICD-10-CM | POA: Diagnosis not present

## 2022-03-09 DIAGNOSIS — M6281 Muscle weakness (generalized): Secondary | ICD-10-CM

## 2022-03-09 NOTE — Therapy (Signed)
OUTPATIENT PHYSICAL THERAPY LOWER EXTREMITY Treatment   Patient Name: Tammy [name] MRN: [id number] DOB:16-Nov-1960, 61 y.o., female Today's Date: 03/09/2022   PT End of Session - 03/09/22 0833     Visit Number 8    Number of Visits 16    Date for PT Re-Evaluation 05/03/22    Authorization Type [employer]    PT Start Time 0802    PT Stop Time 0843    PT Time Calculation (min) 41 min    Activity Tolerance Patient tolerated treatment well    Behavior During Therapy [facility] for tasks assessed/performed                   Past Medical History:  Diagnosis Date   Arthritis    Chronic fatigue syndrome    Diverticulosis    External hemorrhoids    Hypertension    Internal hemorrhoids    Neuropathy    Spinal stenosis    Past Surgical History:  Procedure Laterality Date   CARDIAC CATHETERIZATION  09/18/2012   LEFT HEART CATHETERIZATION WITH CORONARY ANGIOGRAM N/A 09/18/2012   Procedure: LEFT HEART CATHETERIZATION WITH CORONARY ANGIOGRAM;  Surgeon: Sherren Mocha, MD;  Location: [facility] CATH LAB;  Service: Cardiovascular;  Laterality: N/A;   TONSILLECTOMY  1965   Patient Active Problem List   Diagnosis Date Noted   Greater trochanteric bursitis of left hip 01/11/2022   Anxiety 07/24/2020   Nonallopathic lesion of cervical region 04/03/2020   Nonallopathic lesion of thoracic region 04/03/2020   Nonallopathic lesion of lumbar region 04/03/2020   Cervical stenosis of spine 03/31/2020   Attention deficit disorder (ADD) in adult 03/31/2020   Low bone density 03/17/2020   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[place]) 02/15/2020   GERD (gastroesophageal reflux disease) 02/15/2020   Allergic rhinitis 03/09/2016   Essential hypertension 02/18/2014   Chronic pain syndrome 02/22/2013   Chronic insomnia 02/22/2013   Paresthesias 10/25/2012   Hyperlipidemia 09/18/2012    PCP: [name], MD  REFERRING PROVIDER: [name], DO  REFERRING DIAG: [phone]  (ICD-10-CM) - Left hip pain  THERAPY DIAG:  Pain in left hip  Difficulty walking  Muscle weakness (generalized)  ONSET DATE: Fall 2022  SUBJECTIVE:   SUBJECTIVE STATEMENT: Pt states that she felt better after last session. She walked during lunch and it started hurting after about 5 mins. Pt states pain with sleeping is better but walking has not changed.   PERTINENT HISTORY: Depression, spinal stenosis, HTN, chronic pain  PAIN:  Are you having pain? No: NPRS scale: 1/10 Pain location: lower back, ant thigh, posterior hip Pain description: "tight Aggravating factors: walking, hip ER, stairs, crossing the legs  Relieving factors: swimming, hot tub,   PRECAUTIONS: None  WEIGHT BEARING RESTRICTIONS No  FALLS:  Has patient fallen in last 6 months? 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 No acute findings.  Hip Xray FINDINGS: Mildly decreased bone mineralization. Minimal superior left femoroacetabular joint space narrowing. Mild superolateral left acetabular degenerative osteophytosis. No significant degenerative change within the right hip, pubic symphysis, or sacroiliac joints. No acute fracture or dislocation.   IMPRESSION: Mild left femoroacetabular osteoarthritis.    PATIENT  SURVEYS:  LEFS Lower Extremity Functional Score: 46 / 80 = 57.5 %  POSTURE:  R SB in standing and with gait  PALPATION: TTP and hypertonicity of L hip flexors, especially in region of TFL and rec fem  Mild L hip joint tightness at end range ER and flexion; springy end feel vs firm  LE  ROM:  Passive ROM Right 02/02/2022 Left 02/02/2022  Hip flexion WFL 110  Hip extension WFL 0  Hip abduction WFL WNL  Hip adduction WFL WNL  Hip internal rotation WFL WNL p!  Hip external rotation [facility] WNL p!   (Blank rows = not tested)  LE MMT:  MMT Right 02/02/2022 Left 02/02/2022  Hip flexion 5/5 4+/5 p!   Hip extension 5/5 4+/5  Hip abduction 5/5 5/5  Hip adduction 5/5 5/5  Knee extension 5/5 5/5   (Blank rows = not tested)  LOWER EXTREMITY SPECIAL TESTS:  Hip special tests: Saralyn Pilar (FABER) test: positive , Trendelenburg test: positive , Ely's test: positive , SI compression test: negative, Hip scouring test: positive , Anterior hip impingement test: positive , and Piriformis test: positive   FUNCTIONAL TESTS:  Step up/step down: 6 stairs, antalgic gait pattern with descending, decreased lowering control, pain with end range hip extension    TODAY'S TREATMENT:  6/20  L hip Mulligan belt mob grade IV lateral and inf, IR/ER mob with movement grade IV; LAD grade IV S/L ext mob grade IV  DKTC 5s 10x Standing L hip flexor stretch 30s 3x with trunk SB away  Thomas stretch edge of bed with strap 30s 3x Figure 4 bridge 2x10 each Figure 4 supine stretch (hands through window) 30s 3x Step up 6" box 2x10  6/13  L rec fem, VL and TFL STM;  L hip Mulligan belt mob grade III lateral and inf, IR/ER mob with movement grade IV  DKTC 5s 10x PPT 2s 10x Standing L hip flexor stretch 30s 3x with trunk SB away  Thomas stretch edge of bed with strap 30s 3x Figure 4 bridge 2x10 each Jefferson curl 6x Figure 4 supine stretch (hands through window) 30s 3x Standing HS stretch 30s 3x   PATIENT EDUCATION:  Education details: trial of walking/hiking pole, exercise progression, muscle firing,  envelope of function, HEP, POC   Person educated: Patient Education method: Explanation, Demonstration, Tactile cues, Verbal cues, and Handouts Education comprehension: verbalized  understanding, returned demonstration, verbal cues required, and tactile cues required   HOME EXERCISE PROGRAM: Access Code: [email or web address] URL: [email or web address] Date: 02/02/2022   ASSESSMENT:  CLINICAL IMPRESSION: Pt with improvement in hip extension and flexion following more aggressive joint mobilizations and stretching. Pt was able to improve gait pattern following session. Pt was advised to consider use of hiking pole in order to return to walking and reduce pain with longer duration walking. Pt has made improvement with baseline pain but functional mobility has made minimal change. Plan to finish 10th visit and re-assess need for return to MD at that time. Continue with L lumbopelvic approach with L hip mobilizations and SL strength. Pt was able to introduce SL strengthening today without discomfort. Pt would benefit from continued skilled therapy in order to reach goals and maximize functional L LE strength and ROM for full return to PLOF.     OBJECTIVE IMPAIRMENTS Abnormal gait, decreased activity tolerance, decreased mobility, difficulty walking, decreased ROM, decreased strength, hypomobility, increased fascial restrictions, increased muscle spasms, impaired flexibility, impaired sensation, improper body mechanics, postural dysfunction, and pain.   ACTIVITY  LIMITATIONS cleaning, community activity, occupation, yard work, shopping, and exercise .   PERSONAL FACTORS Age, Behavior pattern, Past/current experiences, Time since onset of injury/illness/exacerbation, and 1-2 comorbidities:    are also affecting patient's functional outcome.    REHAB POTENTIAL: Good  CLINICAL DECISION MAKING: Stable/uncomplicated  EVALUATION COMPLEXITY: Low   GOALS:  SHORT TERM GOALS: Target date: 03/16/2022   Pt will become independent with HEP in order to demonstrate synthesis of PT education.  Goal status: INITIAL  2.  Pt will be able to demonstrate active hip extension during  gait without pain in order to demonstrate functional improvement in LE function for self-care and house hold duties.   Goal status: INITIAL  3.  Pt will report at least 2 pt reduction on NPRS scale for pain during walking in order to demonstrate functional improvement with household activity, self care, and ADL.  Goal status: INITIAL    LONG TERM GOALS: Target date: 04/27/2022    Pt  will become independent with final HEP in order to demonstrate synthesis of PT education.  Goal status: INITIAL  2.  Pt will be able to demonstrate/report ability to walk >40 mins without pain in order to demonstrate functional improvement and tolerance to exercise and community mobility.   Goal status: INITIAL  3.  Pt will have an at least 18 pt improvement in LEFS measure in order to demonstrate MCID improvement in daily function.  Goal status: INITIAL  4.  Pt will be able to demonstrate ability to perform reciprocal stair stepping without pain or discomfort in order to demonstrate functional improvement in LE function for daily mobility and house hold duties.   Goal status: INITIAL   PLAN: PT FREQUENCY: 1-2x/week  PT DURATION: 12 weeks (likely DC by 8wks)  PLANNED INTERVENTIONS: Therapeutic exercises, Therapeutic activity, Neuromuscular re-education, Balance training, Gait training, Patient/Family education, Joint manipulation, Joint mobilization, Stair training, Orthotic/Fit training, DME instructions, Aquatic Therapy, Dry Needling, Electrical stimulation, Spinal manipulation, Spinal mobilization, Cryotherapy, Moist heat, scar mobilization, Taping, Vasopneumatic device, Traction, Ultrasound, Ionotophoresis '4mg'$ /ml Dexamethasone, Manual therapy, and Re-evaluation  PLAN FOR NEXT SESSION: continue lumbopelvic stability and manual to Lt ant thigh/hip  Daleen Bo PT, DPT 03/09/22 8:46 AM

## 2022-03-12 ENCOUNTER — Ambulatory Visit (HOSPITAL_BASED_OUTPATIENT_CLINIC_OR_DEPARTMENT_OTHER): Payer: 59 | Admitting: Physical Therapy

## 2022-03-12 ENCOUNTER — Encounter (HOSPITAL_BASED_OUTPATIENT_CLINIC_OR_DEPARTMENT_OTHER): Payer: Self-pay | Admitting: Physical Therapy

## 2022-03-12 DIAGNOSIS — R262 Difficulty in walking, not elsewhere classified: Secondary | ICD-10-CM | POA: Diagnosis not present

## 2022-03-12 DIAGNOSIS — M6281 Muscle weakness (generalized): Secondary | ICD-10-CM

## 2022-03-12 DIAGNOSIS — M25552 Pain in left hip: Secondary | ICD-10-CM | POA: Diagnosis not present

## 2022-03-16 ENCOUNTER — Encounter (HOSPITAL_BASED_OUTPATIENT_CLINIC_OR_DEPARTMENT_OTHER): Payer: Self-pay | Admitting: Physical Therapy

## 2022-03-16 ENCOUNTER — Ambulatory Visit (HOSPITAL_BASED_OUTPATIENT_CLINIC_OR_DEPARTMENT_OTHER): Payer: 59 | Admitting: Physical Therapy

## 2022-03-16 DIAGNOSIS — M25552 Pain in left hip: Secondary | ICD-10-CM | POA: Diagnosis not present

## 2022-03-16 DIAGNOSIS — R262 Difficulty in walking, not elsewhere classified: Secondary | ICD-10-CM | POA: Diagnosis not present

## 2022-03-16 DIAGNOSIS — M6281 Muscle weakness (generalized): Secondary | ICD-10-CM

## 2022-03-18 ENCOUNTER — Encounter (HOSPITAL_BASED_OUTPATIENT_CLINIC_OR_DEPARTMENT_OTHER): Payer: Self-pay | Admitting: Physical Therapy

## 2022-03-18 ENCOUNTER — Ambulatory Visit (HOSPITAL_BASED_OUTPATIENT_CLINIC_OR_DEPARTMENT_OTHER): Payer: 59 | Admitting: Physical Therapy

## 2022-03-18 DIAGNOSIS — R262 Difficulty in walking, not elsewhere classified: Secondary | ICD-10-CM | POA: Diagnosis not present

## 2022-03-18 DIAGNOSIS — M25552 Pain in left hip: Secondary | ICD-10-CM | POA: Diagnosis not present

## 2022-03-18 DIAGNOSIS — M6281 Muscle weakness (generalized): Secondary | ICD-10-CM | POA: Diagnosis not present

## 2022-03-18 NOTE — Therapy (Signed)
OUTPATIENT PHYSICAL THERAPY LOWER EXTREMITY Treatment   Patient Name: VIANNE GRIESHOP MRN: [id number] DOB:1961/08/08, 61 y.o., female Today's Date: 03/18/2022   PT End of Session - 03/18/22 0825     Visit Number 11    Number of Visits 16    Date for PT Re-Evaluation 05/03/22    Authorization Type [employer]    PT Start Time 0803    PT Stop Time 0841    PT Time Calculation (min) 38 min    Activity Tolerance Patient tolerated treatment well    Behavior During Therapy [facility] for tasks assessed/performed                      Past Medical History:  Diagnosis Date   Arthritis    Chronic fatigue syndrome    Diverticulosis    External hemorrhoids    Hypertension    Internal hemorrhoids    Neuropathy    Spinal stenosis    Past Surgical History:  Procedure Laterality Date   CARDIAC CATHETERIZATION  09/18/2012   LEFT HEART CATHETERIZATION WITH CORONARY ANGIOGRAM N/A 09/18/2012   Procedure: LEFT HEART CATHETERIZATION WITH CORONARY ANGIOGRAM;  Surgeon: Sherren Mocha, MD;  Location: [facility] CATH LAB;  Service: Cardiovascular;  Laterality: N/A;   TONSILLECTOMY  1965   Patient Active Problem List   Diagnosis Date Noted   Greater trochanteric bursitis of left hip 01/11/2022   Anxiety 07/24/2020   Nonallopathic lesion of cervical region 04/03/2020   Nonallopathic lesion of thoracic region 04/03/2020   Nonallopathic lesion of lumbar region 04/03/2020   Cervical stenosis of spine 03/31/2020   Attention deficit disorder (ADD) in adult 03/31/2020   Low bone density 03/17/2020   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[place]) 02/15/2020   GERD (gastroesophageal reflux disease) 02/15/2020   Allergic rhinitis 03/09/2016   Essential hypertension 02/18/2014   Chronic pain syndrome 02/22/2013   Chronic insomnia 02/22/2013   Paresthesias 10/25/2012   Hyperlipidemia 09/18/2012    PCP: [name], MD  REFERRING PROVIDER: [name], DO  REFERRING DIAG: [phone]  (ICD-10-CM) - Left hip pain  THERAPY DIAG:  Pain in left hip  Difficulty walking  Muscle weakness (generalized)  ONSET DATE: Fall 2022  SUBJECTIVE:   SUBJECTIVE STATEMENT: Pt states it continues to feel good. She was able to walk again with minor catching in the front.   PERTINENT HISTORY: Depression, spinal stenosis, HTN, chronic pain  PAIN:  Are you having pain? No: NPRS scale: 1/10 Pain location: lower back, ant thigh, posterior hip Pain description: "tight Aggravating factors: walking, hip ER, stairs, crossing the legs  Relieving factors: swimming, hot tub,   PRECAUTIONS: None  WEIGHT BEARING RESTRICTIONS No  FALLS:  Has patient fallen in last 6 months? 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 No acute findings.  Hip Xray FINDINGS: Mildly decreased bone mineralization. Minimal superior left femoroacetabular joint space narrowing. Mild superolateral left acetabular degenerative osteophytosis. No significant degenerative change within the right hip, pubic symphysis, or sacroiliac joints. No acute fracture or dislocation.   IMPRESSION: Mild left femoroacetabular osteoarthritis.    PATIENT SURVEYS:  LEFS Lower Extremity Functional Score: 46 / 80 =  57.5 %  Lower Extremity Functional Score: 64 / 80 = 80.0 %    PALPATION:  Mild L hip joint tightness at end range flexion; springy end feel vs firm  LE ROM:  Passive ROM Right 02/02/2022 Left 02/02/2022 L 6/27  Hip flexion WFL 110 120  Hip extension WFL 0 5  Hip abduction WFL WNL WNL  Hip adduction WFL WNL WNL   Hip internal rotation WFL WNL p! WNL  Hip external rotation WFL WNL p! WNL   (Blank rows = not tested)  LE MMT:  MMT Right 02/02/2022 Left 02/02/2022 L 6/27  Hip flexion 5/5 4+/5 p!  4+/5  Hip extension 5/5 4+/5 5/5  Hip abduction 5/5 5/5 5/5  Hip adduction 5/5 5/5 5/5  Knee extension 5/5 5/5 5/5   (Blank rows = not tested)  Gait: Stairs: Bilat Trendelenburg but no pain with ascent or descent  TODAY'S TREATMENT:   6/29   L hip Mulligan belt mob grade IV lateral and inf, IR/ER mob with movement grade IV; LAD grade IV S/L ext mob grade IV  Self hip extension mob with black TB pulling ant 5s hold 15x Prone hip flexor stretch with towel prop under knee 30s 3x  RTB at ankles monster walk fwd and retro 76fx2  Step up 8" box 2x10 fwd 15lb KB  8" lateral step  2x10 Shuttle leg press staggered stance 125 lbs 3x10 Lateral shuttle leg press 2x10 50lbs  Fwd lunge at silver bar 2x10 bilat  Goblet squat to low table height 3x10 15lb KB   6/27     L hip Mulligan belt mob grade IV lateral and inf, IR/ER mob with movement grade IV; LAD grade IV S/L ext mob grade IV     RTB at ankles monster walk fwd and retro 313f2 Step up 8" box 2x10 lateral Supine Hip flexor march YTB at toe box 2x10 90/90 position Shuttle leg press staggered stance 118lbs 3x10 Lateral shuttle leg press 2x10 50lbs  Fwd lunge at silver bar 2x10 bilat  6/23  L hip Mulligan belt mob grade IV lateral and inf, IR/ER mob with movement grade IV; LAD grade IV S/L ext mob grade IV  Thomas stretch edge of bed with strap 30s 3x Sidestepping RTB 3566f laps RTB at ankles monster walk fwd and retro 26f59fFigure 4 supine stretch (hands through window) 30s 3x Step up 8" box 2x10 fwd and lateral white leg press staggered stance 3x10 75lbs  6/20  L hip Mulligan belt mob grade IV lateral and inf, IR/ER mob with movement grade IV; LAD grade IV S/L ext mob grade IV  DKTC 5s 10x Standing L hip flexor stretch 30s 3x  with trunk SB away  Thomas stretch edge of bed with strap 30s 3x Figure 4 bridge 2x10 each Figure 4 supine stretch (hands through window) 30s 3x Step up 6" box 2x10  6/13  L rec fem, VL and TFL STM;  L hip Mulligan belt mob grade III lateral and inf, IR/ER mob with movement grade IV  DKTC 5s 10x PPT 2s 10x Standing L hip flexor stretch 30s 3x with trunk SB away  Thomas stretch edge of bed with strap 30s 3x Figure 4 bridge 2x10 each Jefferson curl 6x Figure 4 supine stretch (hands through window) 30s 3x Standing HS stretch 30s 3x   PATIENT EDUCATION:  Education details:  exercise progression, muscle firing,  envelope of function, HEP, POC   Person educated: Patient Education method: Explanation, Demonstration, Tactile  cues, Verbal cues, and Handouts Education comprehension: verbalized understanding, returned demonstration, verbal cues required, and tactile cues required   HOME EXERCISE PROGRAM: Access Code: [email or web address] URL: [email or web address] Date: 02/02/2022   ASSESSMENT:  CLINICAL IMPRESSION: Pt found relief anterior hip stiffness and tightness following manual therapy and self  mobilization. Pt able to increase intensity and volume of loading as well as loaded CKC without increasing pain. Pt found most difficulty with goblet squatting. Plan to continue with mobilizations and L LE strength/stability. Pt tolerated sessoin well without exacerbation of hip pain. Pt would benefit from continued skilled therapy in order to reach goals and maximize functional L LE strength and ROM for full return to PLOF.     OBJECTIVE IMPAIRMENTS Abnormal gait, decreased activity tolerance, decreased mobility, difficulty walking, decreased ROM, decreased strength, hypomobility, increased fascial restrictions, increased muscle spasms, impaired flexibility, impaired sensation, improper body mechanics, postural dysfunction, and pain.   ACTIVITY LIMITATIONS cleaning, community  activity, occupation, yard work, shopping, and exercise .   PERSONAL FACTORS Age, Behavior pattern, Past/current experiences, Time since onset of injury/illness/exacerbation, and 1-2 comorbidities:    are also affecting patient's functional outcome.    REHAB POTENTIAL: Good  CLINICAL DECISION MAKING: Stable/uncomplicated  EVALUATION COMPLEXITY: Low   GOALS:  SHORT TERM GOALS: Target date: 03/16/2022   Pt will become independent with HEP in order to demonstrate synthesis of PT education.  Goal status: MET  2.  Pt will be able to demonstrate active hip extension during gait without pain in order to demonstrate functional improvement in LE function for self-care and house hold duties.   Goal status: MET  3.  Pt will report at least 2 pt reduction on NPRS scale for pain during walking in order to demonstrate functional improvement with household activity, self care, and ADL.  Goal status: MET    LONG TERM GOALS: Target date: 04/27/2022    Pt  will become independent with final HEP in order to demonstrate synthesis of PT education.  Goal status: ongoing  2.  Pt will be able to demonstrate/report ability to walk >40 mins without pain in order to demonstrate functional improvement and tolerance to exercise and community mobility.   Goal status: ongoing  3.  Pt will have an at least 18 pt improvement in LEFS measure in order to demonstrate MCID improvement in daily function.  Goal status: MET  4.  Pt will be able to demonstrate ability to perform reciprocal stair stepping without pain or discomfort in order to demonstrate functional improvement in LE function for daily mobility and house hold duties.   Goal status: MET   PLAN: PT FREQUENCY: 1-2x/week  PT DURATION: 12 weeks (likely DC by 8wks)  PLANNED INTERVENTIONS: Therapeutic exercises, Therapeutic activity, Neuromuscular re-education, Balance training, Gait training, Patient/Family education, Joint manipulation, Joint  mobilization, Stair training, Orthotic/Fit training, DME instructions, Aquatic Therapy, Dry Needling, Electrical stimulation, Spinal manipulation, Spinal mobilization, Cryotherapy, Moist heat, scar mobilization, Taping, Vasopneumatic device, Traction, Ultrasound, Ionotophoresis 54m/ml Dexamethasone, Manual therapy, and Re-evaluation  PLAN FOR NEXT SESSION: continue lumbopelvic stability and manual to Lt ant thigh/hip  ADaleen BoPT, DPT 03/18/22 8:50 AM

## 2022-03-22 DIAGNOSIS — G894 Chronic pain syndrome: Secondary | ICD-10-CM | POA: Diagnosis not present

## 2022-03-22 DIAGNOSIS — Z9189 Other specified personal risk factors, not elsewhere classified: Secondary | ICD-10-CM | POA: Diagnosis not present

## 2022-03-22 DIAGNOSIS — E785 Hyperlipidemia, unspecified: Secondary | ICD-10-CM | POA: Diagnosis not present

## 2022-03-22 DIAGNOSIS — R0602 Shortness of breath: Secondary | ICD-10-CM | POA: Diagnosis not present

## 2022-03-22 DIAGNOSIS — F324 Major depressive disorder, single episode, in partial remission: Secondary | ICD-10-CM | POA: Diagnosis not present

## 2022-03-22 DIAGNOSIS — K219 Gastro-esophageal reflux disease without esophagitis: Secondary | ICD-10-CM | POA: Diagnosis not present

## 2022-03-22 DIAGNOSIS — R5383 Other fatigue: Secondary | ICD-10-CM | POA: Diagnosis not present

## 2022-03-22 DIAGNOSIS — Z6829 Body mass index (BMI) 29.0-29.9, adult: Secondary | ICD-10-CM | POA: Diagnosis not present

## 2022-03-22 DIAGNOSIS — I10 Essential (primary) hypertension: Secondary | ICD-10-CM | POA: Diagnosis not present

## 2022-03-25 ENCOUNTER — Ambulatory Visit (HOSPITAL_BASED_OUTPATIENT_CLINIC_OR_DEPARTMENT_OTHER): Payer: 59 | Attending: Family Medicine | Admitting: Physical Therapy

## 2022-03-25 ENCOUNTER — Encounter (HOSPITAL_BASED_OUTPATIENT_CLINIC_OR_DEPARTMENT_OTHER): Payer: Self-pay | Admitting: Physical Therapy

## 2022-03-25 DIAGNOSIS — M25552 Pain in left hip: Secondary | ICD-10-CM | POA: Diagnosis not present

## 2022-03-25 DIAGNOSIS — R262 Difficulty in walking, not elsewhere classified: Secondary | ICD-10-CM | POA: Insufficient documentation

## 2022-03-25 DIAGNOSIS — M6281 Muscle weakness (generalized): Secondary | ICD-10-CM | POA: Insufficient documentation

## 2022-03-25 NOTE — Therapy (Signed)
OUTPATIENT PHYSICAL THERAPY LOWER EXTREMITY Treatment   Patient Name: Tammy [name] MRN: [id number] DOB:Feb 05, 1961, 61 y.o., female Today's Date: 03/25/2022   PT End of Session - 03/25/22 0803     Visit Number 12    Number of Visits 16    Date for PT Re-Evaluation 05/03/22    Authorization Type [employer]    PT Start Time 0715    PT Stop Time 0758    PT Time Calculation (min) 43 min    Activity Tolerance Patient tolerated treatment well    Behavior During Therapy [facility] for tasks assessed/performed                       Past Medical History:  Diagnosis Date   Arthritis    Chronic fatigue syndrome    Diverticulosis    External hemorrhoids    Hypertension    Internal hemorrhoids    Neuropathy    Spinal stenosis    Past Surgical History:  Procedure Laterality Date   CARDIAC CATHETERIZATION  09/18/2012   LEFT HEART CATHETERIZATION WITH CORONARY ANGIOGRAM N/A 09/18/2012   Procedure: LEFT HEART CATHETERIZATION WITH CORONARY ANGIOGRAM;  Surgeon: Sherren Mocha, MD;  Location: [facility] CATH LAB;  Service: Cardiovascular;  Laterality: N/A;   TONSILLECTOMY  1965   Patient Active Problem List   Diagnosis Date Noted   Greater trochanteric bursitis of left hip 01/11/2022   Anxiety 07/24/2020   Nonallopathic lesion of cervical region 04/03/2020   Nonallopathic lesion of thoracic region 04/03/2020   Nonallopathic lesion of lumbar region 04/03/2020   Cervical stenosis of spine 03/31/2020   Attention deficit disorder (ADD) in adult 03/31/2020   Low bone density 03/17/2020   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[place]) 02/15/2020   GERD (gastroesophageal reflux disease) 02/15/2020   Allergic rhinitis 03/09/2016   Essential hypertension 02/18/2014   Chronic pain syndrome 02/22/2013   Chronic insomnia 02/22/2013   Paresthesias 10/25/2012   Hyperlipidemia 09/18/2012    PCP: [name], MD  REFERRING PROVIDER: [name], DO  REFERRING DIAG: [phone]  (ICD-10-CM) - Left hip pain  THERAPY DIAG:  Pain in left hip  Difficulty walking  Muscle weakness (generalized)  ONSET DATE: Fall 2022  SUBJECTIVE:   SUBJECTIVE STATEMENT: Went swimming this AM. Denies popping/clicking in hip but does have tightness on anterior thigh.   PERTINENT HISTORY: Depression, spinal stenosis, HTN, chronic pain  PAIN:  Are you having pain? No: NPRS scale: 1/10 Pain location: lower back, ant thigh, posterior hip Pain description: "tight Aggravating factors: walking, hip ER, stairs, crossing the legs  Relieving factors: swimming, hot tub,   PRECAUTIONS: None  WEIGHT BEARING RESTRICTIONS No  FALLS:  Has patient fallen in last 6 months? 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 No acute findings.  Hip Xray FINDINGS: Mildly decreased bone mineralization. Minimal superior left femoroacetabular joint space narrowing. Mild superolateral left acetabular degenerative osteophytosis. No significant degenerative change within the right hip, pubic symphysis, or sacroiliac joints. No acute fracture or dislocation.   IMPRESSION: Mild left femoroacetabular osteoarthritis.    PATIENT SURVEYS:  LEFS Lower Extremity Functional Score: 46 / 80 = 57.5 %  Lower Extremity Functional Score: 64 / 80 = 80.0 %    PALPATION:  Mild L hip joint tightness at end range flexion; springy end feel vs firm  LE ROM:  Passive ROM Right 02/02/2022 Left 02/02/2022 L 6/27  Hip flexion WFL 110 120  Hip extension WFL 0 5  Hip abduction WFL WNL WNL  Hip adduction WFL WNL WNL  Hip  internal rotation WFL WNL p! WNL  Hip external rotation WFL WNL p! WNL   (Blank rows = not tested)  LE MMT:  MMT Right 02/02/2022 Left 02/02/2022 L 6/27  Hip flexion 5/5 4+/5 p!  4+/5  Hip extension 5/5 4+/5 5/5  Hip abduction 5/5 5/5 5/5  Hip adduction 5/5 5/5 5/5  Knee extension 5/5 5/5 5/5   (Blank rows = not tested)  Gait: Stairs: Bilat Trendelenburg but no pain with ascent or descent  03/25/22: notable post Lt innom rotation in supine with functional LLD  TODAY'S TREATMENT:  7/6  MANUAL: roller Lt lateral thigh, STM Lt pectineus, hip mobs- LAD and lateral in hooklying  Hesch self correction for Lt post innom rotation- advised of leg off of table as well as Rt single knee to chest  Hooklying core/pelvic floor contraction with breathing Bridges Clams  Contractions with exhale and lift   6/29   L hip Mulligan belt mob grade IV lateral and inf, IR/ER mob with movement grade IV; LAD grade IV S/L ext mob grade IV  Self hip extension mob with black TB pulling ant 5s hold 15x Prone hip flexor stretch with towel prop under knee 30s 3x  RTB at ankles monster walk fwd and retro 54fx2  Step up 8" box 2x10 fwd 15lb KB  8" lateral step  2x10 Shuttle leg press staggered stance 125 lbs 3x10 Lateral shuttle leg press 2x10 50lbs  Fwd lunge at silver bar 2x10 bilat  Goblet squat to low table height 3x10 15lb KB   6/27     L hip Mulligan belt mob grade IV lateral and inf, IR/ER mob with movement grade IV; LAD grade IV S/L ext mob grade IV     RTB at ankles monster walk fwd and retro 363f2 Step up 8" box 2x10 lateral Supine Hip flexor march YTB at toe box 2x10 90/90 position Shuttle leg press staggered stance 118lbs 3x10 Lateral shuttle leg press 2x10 50lbs  Fwd lunge at silver bar 2x10 bilat  6/23  L hip Mulligan belt mob grade IV lateral and inf, IR/ER mob with movement grade IV; LAD grade IV S/L ext mob grade IV  Thomas stretch edge of bed with strap 30s  3x Sidestepping RTB 3545f laps RTB at ankles monster walk fwd and retro 53f47fFigure 4 supine stretch (hands through window) 30s 3x Step up 8" box 2x10 fwd and lateral white leg press staggered stance 3x10 75lbs    PATIENT EDUCATION:  Education details:  exercise progression, muscle firing,  envelope of function, HEP, POC   Person educated: Patient Education method: Explanation, Demonstration, Tactile cues, Verbal cues, and Handouts Education comprehension: verbalized understanding, returned demonstration, verbal cues required, and tactile cues required   HOME EXERCISE PROGRAM: Access Code: [email or web address]: [email or web address] Date: 02/02/2022   ASSESSMENT:  CLINICAL IMPRESSION: Asked her to focus on Rt knee to chest for innom correction and bridges, clams with pelvic floor/core engagement. Pt reported feeling good following treatment today. Beginning pelvic floor therapy for rectal prolapse in about 3 weeks.     OBJECTIVE IMPAIRMENTS Abnormal gait, decreased  activity tolerance, decreased mobility, difficulty walking, decreased ROM, decreased strength, hypomobility, increased fascial restrictions, increased muscle spasms, impaired flexibility, impaired sensation, improper body mechanics, postural dysfunction, and pain.   ACTIVITY LIMITATIONS cleaning, community activity, occupation, yard work, shopping, and exercise .   PERSONAL FACTORS Age, Behavior pattern, Past/current experiences, Time since onset of injury/illness/exacerbation, and 1-2 comorbidities:    are also affecting patient's functional outcome.    REHAB POTENTIAL: Good  CLINICAL DECISION MAKING: Stable/uncomplicated  EVALUATION COMPLEXITY: Low   GOALS:  SHORT TERM GOALS: Target date: 03/16/2022   Pt will become independent with HEP in order to demonstrate synthesis of PT education.  Goal status: MET  2.  Pt will be able to demonstrate active hip extension during gait without pain in order  to demonstrate functional improvement in LE function for self-care and house hold duties.   Goal status: MET  3.  Pt will report at least 2 pt reduction on NPRS scale for pain during walking in order to demonstrate functional improvement with household activity, self care, and ADL.  Goal status: MET    LONG TERM GOALS: Target date: 04/27/2022    Pt  will become independent with final HEP in order to demonstrate synthesis of PT education.  Goal status: ongoing  2.  Pt will be able to demonstrate/report ability to walk >40 mins without pain in order to demonstrate functional improvement and tolerance to exercise and community mobility.   Goal status: ongoing  3.  Pt will have an at least 18 pt improvement in LEFS measure in order to demonstrate MCID improvement in daily function.  Goal status: MET  4.  Pt will be able to demonstrate ability to perform reciprocal stair stepping without pain or discomfort in order to demonstrate functional improvement in LE function for daily mobility and house hold duties.   Goal status: MET   PLAN: PT FREQUENCY: 1-2x/week  PT DURATION: 12 weeks (likely DC by 8wks)  PLANNED INTERVENTIONS: Therapeutic exercises, Therapeutic activity, Neuromuscular re-education, Balance training, Gait training, Patient/Family education, Joint manipulation, Joint mobilization, Stair training, Orthotic/Fit training, DME instructions, Aquatic Therapy, Dry Needling, Electrical stimulation, Spinal manipulation, Spinal mobilization, Cryotherapy, Moist heat, scar mobilization, Taping, Vasopneumatic device, Traction, Ultrasound, Ionotophoresis 22m/ml Dexamethasone, Manual therapy, and Re-evaluation  PLAN FOR NEXT SESSION: continue lumbopelvic stability and manual to Lt ant thigh/hip  Hitomi Slape C. Marenda Accardi PT, DPT 03/25/22 8:08 AM

## 2022-03-29 ENCOUNTER — Encounter (HOSPITAL_BASED_OUTPATIENT_CLINIC_OR_DEPARTMENT_OTHER): Payer: Self-pay | Admitting: Physical Therapy

## 2022-03-29 ENCOUNTER — Ambulatory Visit: Payer: 59 | Admitting: Physical Therapy

## 2022-03-29 ENCOUNTER — Ambulatory Visit (HOSPITAL_BASED_OUTPATIENT_CLINIC_OR_DEPARTMENT_OTHER): Payer: 59 | Admitting: Physical Therapy

## 2022-03-29 ENCOUNTER — Encounter (HOSPITAL_BASED_OUTPATIENT_CLINIC_OR_DEPARTMENT_OTHER): Payer: Self-pay

## 2022-03-29 DIAGNOSIS — M25552 Pain in left hip: Secondary | ICD-10-CM

## 2022-03-29 DIAGNOSIS — R262 Difficulty in walking, not elsewhere classified: Secondary | ICD-10-CM

## 2022-03-29 DIAGNOSIS — M6281 Muscle weakness (generalized): Secondary | ICD-10-CM

## 2022-03-29 NOTE — Therapy (Signed)
[place], [place], [address] Phone: [phone]   Fax:  [phone]  Patient Details  Name: Tammy [name] MRN: [id number] Date of Birth: [birth year] Referring Provider:  [name], MD  Encounter Date: 03/29/2022  Pt arrived, and informed therapist that she had two other scheduled appts for the week. Plan of care is for 1-2x/wk.  Pt interested in keeping 2 appts on land with PT and requested to cancel aquatic appt for today.    Pt not seen for aquatic appt.  Confirmed upcoming appts with pt.     Shelbie Hutching 03/29/2022, 7:54 AM  [facility] [address] [place], [place], [address] Phone: [phone]   Fax:  [phone]

## 2022-03-30 ENCOUNTER — Ambulatory Visit (HOSPITAL_BASED_OUTPATIENT_CLINIC_OR_DEPARTMENT_OTHER): Payer: 59 | Admitting: Physical Therapy

## 2022-03-30 ENCOUNTER — Encounter (HOSPITAL_BASED_OUTPATIENT_CLINIC_OR_DEPARTMENT_OTHER): Payer: Self-pay | Admitting: Physical Therapy

## 2022-03-30 DIAGNOSIS — M25552 Pain in left hip: Secondary | ICD-10-CM | POA: Diagnosis not present

## 2022-03-30 DIAGNOSIS — M6281 Muscle weakness (generalized): Secondary | ICD-10-CM | POA: Diagnosis not present

## 2022-03-30 DIAGNOSIS — R262 Difficulty in walking, not elsewhere classified: Secondary | ICD-10-CM | POA: Diagnosis not present

## 2022-03-30 NOTE — Therapy (Signed)
OUTPATIENT PHYSICAL THERAPY LOWER EXTREMITY Treatment   Patient Name: Tammy [name] MRN: [id number] DOB:[birth year], 61 y.o., female Today's Date: 03/30/2022   PT End of Session - 03/30/22 0832     Visit Number 13    Number of Visits 16    Date for PT Re-Evaluation 05/03/22    Authorization Type [employer]    PT Start Time 0803    PT Stop Time 0841    PT Time Calculation (min) 38 min    Activity Tolerance Patient tolerated treatment well    Behavior During Therapy [facility] for tasks assessed/performed                        Past Medical History:  Diagnosis Date   Arthritis    Chronic fatigue syndrome    Diverticulosis    External hemorrhoids    Hypertension    Internal hemorrhoids    Neuropathy    Spinal stenosis    Past Surgical History:  Procedure Laterality Date   CARDIAC CATHETERIZATION  09/18/2012   LEFT HEART CATHETERIZATION WITH CORONARY ANGIOGRAM N/A 09/18/2012   Procedure: LEFT HEART CATHETERIZATION WITH CORONARY ANGIOGRAM;  Surgeon: Sherren Mocha, MD;  Location: [facility] CATH LAB;  Service: Cardiovascular;  Laterality: N/A;   TONSILLECTOMY  1965   Patient Active Problem List   Diagnosis Date Noted   Greater trochanteric bursitis of left hip 01/11/2022   Anxiety 07/24/2020   Nonallopathic lesion of cervical region 04/03/2020   Nonallopathic lesion of thoracic region 04/03/2020   Nonallopathic lesion of lumbar region 04/03/2020   Cervical stenosis of spine 03/31/2020   Attention deficit disorder (ADD) in adult 03/31/2020   Low bone density 03/17/2020   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[place]) 02/15/2020   GERD (gastroesophageal reflux disease) 02/15/2020   Allergic rhinitis 03/09/2016   Essential hypertension 02/18/2014   Chronic pain syndrome 02/22/2013   Chronic insomnia 02/22/2013   Paresthesias 10/25/2012   Hyperlipidemia 09/18/2012    PCP: [name], MD  REFERRING PROVIDER: [name], DO  REFERRING DIAG:  [phone] (ICD-10-CM) - Left hip pain  THERAPY DIAG:  Pain in left hip  Difficulty walking  Muscle weakness (generalized)  ONSET DATE: Fall 2022  SUBJECTIVE:   SUBJECTIVE STATEMENT: Pt states she has had little pain since last session. Able to walk with the walking stick without pain at a higher pace.   PERTINENT HISTORY: Depression, spinal stenosis, HTN, chronic pain  PAIN:  Are you having pain? No: NPRS scale: 1/10 Pain location: lower back, ant thigh, posterior hip Pain description: "tight Aggravating factors: walking, hip ER, stairs, crossing the legs  Relieving factors: swimming, hot tub,   PRECAUTIONS: None  WEIGHT BEARING RESTRICTIONS No  FALLS:  Has patient fallen in last 6 months? No  LIVING ENVIRONMENT: Lives with: lives with their spouse Lives in: House/apartment   OCCUPATION: [employer] therapist, standing desk   PLOF: Independent  PATIENT GOALS : Returning to normal walking   OBJECTIVE: objective measures taken at [facility] unless otherwise specified  DIAGNOSTIC FINDINGS:   Lumbar Xray: IMPRESSION: Mild-to-moderate lower lumbar spondylosis without evidence of interval progression from prior. No acute findings.  Hip Xray FINDINGS: Mildly decreased bone mineralization. Minimal superior left femoroacetabular joint space narrowing. Mild superolateral left acetabular degenerative osteophytosis. No significant degenerative change within the right hip, pubic symphysis, or sacroiliac joints. No acute fracture or dislocation.   IMPRESSION: Mild left femoroacetabular osteoarthritis.    PATIENT SURVEYS:  LEFS Lower Extremity  Functional Score: 46 / 80 = 57.5 %  Lower Extremity Functional Score: 64 / 80 = 80.0 %    PALPATION:  Mild L hip joint tightness at end range flexion; springy end feel vs firm  LE ROM:  Passive ROM Right 02/02/2022 Left 02/02/2022 L 6/27  Hip flexion WFL 110 120  Hip extension WFL 0 5  Hip abduction WFL WNL WNL   Hip adduction WFL WNL WNL  Hip internal rotation WFL WNL p! WNL  Hip external rotation WFL WNL p! WNL   (Blank rows = not tested)  LE MMT:  MMT Right 02/02/2022 Left 02/02/2022 L 6/27  Hip flexion 5/5 4+/5 p!  4+/5  Hip extension 5/5 4+/5 5/5  Hip abduction 5/5 5/5 5/5  Hip adduction 5/5 5/5 5/5  Knee extension 5/5 5/5 5/5   (Blank rows = not tested)   03/25/22: notable post Lt innom rotation in supine with functional LLD  TODAY'S TREATMENT:  7/11 L hip Mulligan belt mob grade IV lateral and inf, IR/ER mob with movement grade IV; LAD grade IV S/L ext mob grade IV  Single leg bridge 2x10 RDL 10lbs 3x10 RTB at ankles monster walk fwd and retro 12fx2 Step up 8" box 2x10 fwd 15lb KB  Fwd lunge at silver bar 2x10 bilat  Goblet squat to low table height 3x10 15lb KB   7/6  MANUAL: roller Lt lateral thigh, STM Lt pectineus, hip mobs- LAD and lateral in hooklying  Hesch self correction for Lt post innom rotation- advised of leg off of table as well as Rt single knee to chest  Hooklying core/pelvic floor contraction with breathing Bridges Clams  Contractions with exhale and lift   6/29   L hip Mulligan belt mob grade IV lateral and inf, IR/ER mob with movement grade IV; LAD grade IV S/L ext mob grade IV  Self hip extension mob with black TB pulling ant 5s hold 15x Prone hip flexor stretch with towel prop under knee 30s 3x  RTB at ankles monster walk fwd and retro 373f2  Step up 8" box 2x10 fwd 15lb KB  8" lateral step  2x10 Shuttle leg press staggered stance 125 lbs 3x10 Lateral shuttle leg press 2x10 50lbs  Fwd lunge at silver bar 2x10 bilat (longer stride length) Goblet squat to low table height 3x10 15lb KB   6/27     L hip Mulligan belt mob grade IV lateral and inf, IR/ER mob with movement grade IV; LAD grade IV S/L ext mob grade IV     RTB at ankles monster walk fwd and retro 3566f Step up 8" box 2x10 lateral Supine Hip flexor march YTB at toe box  2x10 90/90 position Shuttle leg press staggered stance 118lbs 3x10 Lateral shuttle leg press 2x10 50lbs  Fwd lunge at silver bar 2x10 bilat  6/23  L hip Mulligan belt mob grade IV lateral and inf, IR/ER mob with movement grade IV; LAD grade IV S/L ext mob grade IV  Thomas stretch edge of bed with strap 30s 3x Sidestepping RTB 67f18flaps RTB at ankles monster walk fwd and retro 67ft49figure 4 supine stretch (hands through window) 30s 3x Step up 8" box 2x10 fwd and lateral white leg press staggered stance 3x10 75lbs    PATIENT EDUCATION:  Education details:  exercise progression, muscle firing,  envelope of function, HEP, POC   Person educated: Patient Education method: Explanation, Demonstration, Tactile cues, Verbal cues, and Handouts Education comprehension: verbalized understanding, returned demonstration, verbal  cues required, and tactile cues required   HOME EXERCISE PROGRAM: Access Code: [email or web address] URL: [email or web address] Date: 02/02/2022   ASSESSMENT:  CLINICAL IMPRESSION: Pt presents with increased L hip extension stiffness at today's session that limited full extension with gait and SL bridging. May be related to walking without offloading of walking stick. Pt exercise focused on improving hip extension strength and re-edu of hip positioning with exercise. Pt was able to perform continued exercise with resistance of the L hip at a higher volume without pain. Plan to continue with L hip ROM and joint mobs in addition to SL stability. Plan to revisit 8" step up with weight. Pt would benefit from continued skilled therapy in order to reach goals and maximize functional L LE strength and ROM for full return to PLOF.    OBJECTIVE IMPAIRMENTS Abnormal gait, decreased activity tolerance, decreased mobility, difficulty walking, decreased ROM, decreased strength, hypomobility, increased fascial restrictions, increased muscle spasms, impaired flexibility, impaired  sensation, improper body mechanics, postural dysfunction, and pain.   ACTIVITY LIMITATIONS cleaning, community activity, occupation, yard work, shopping, and exercise .   PERSONAL FACTORS Age, Behavior pattern, Past/current experiences, Time since onset of injury/illness/exacerbation, and 1-2 comorbidities:    are also affecting patient's functional outcome.    REHAB POTENTIAL: Good  CLINICAL DECISION MAKING: Stable/uncomplicated  EVALUATION COMPLEXITY: Low   GOALS:  SHORT TERM GOALS: Target date: 03/16/2022   Pt will become independent with HEP in order to demonstrate synthesis of PT education.  Goal status: MET  2.  Pt will be able to demonstrate active hip extension during gait without pain in order to demonstrate functional improvement in LE function for self-care and house hold duties.   Goal status: MET  3.  Pt will report at least 2 pt reduction on NPRS scale for pain during walking in order to demonstrate functional improvement with household activity, self care, and ADL.  Goal status: MET    LONG TERM GOALS: Target date: 04/27/2022    Pt  will become independent with final HEP in order to demonstrate synthesis of PT education.  Goal status: ongoing  2.  Pt will be able to demonstrate/report ability to walk >40 mins without pain in order to demonstrate functional improvement and tolerance to exercise and community mobility.   Goal status: ongoing  3.  Pt will have an at least 18 pt improvement in LEFS measure in order to demonstrate MCID improvement in daily function.  Goal status: MET  4.  Pt will be able to demonstrate ability to perform reciprocal stair stepping without pain or discomfort in order to demonstrate functional improvement in LE function for daily mobility and house hold duties.   Goal status: MET   PLAN: PT FREQUENCY: 1-2x/week  PT DURATION: 12 weeks (likely DC by 8wks)  PLANNED INTERVENTIONS: Therapeutic exercises, Therapeutic activity,  Neuromuscular re-education, Balance training, Gait training, Patient/Family education, Joint manipulation, Joint mobilization, Stair training, Orthotic/Fit training, DME instructions, Aquatic Therapy, Dry Needling, Electrical stimulation, Spinal manipulation, Spinal mobilization, Cryotherapy, Moist heat, scar mobilization, Taping, Vasopneumatic device, Traction, Ultrasound, Ionotophoresis 47m/ml Dexamethasone, Manual therapy, and Re-evaluation  PLAN FOR NEXT SESSION: continue lumbopelvic stability and manual to Lt ant thigh/hip  ADaleen BoPT, DPT 03/30/22 8:45 AM

## 2022-04-01 DIAGNOSIS — F411 Generalized anxiety disorder: Secondary | ICD-10-CM | POA: Diagnosis not present

## 2022-04-01 DIAGNOSIS — F332 Major depressive disorder, recurrent severe without psychotic features: Secondary | ICD-10-CM | POA: Diagnosis not present

## 2022-04-01 NOTE — Progress Notes (Signed)
Zach Kevonna Nolte [place] [address] [place] Phone: [phone] Subjective:   IVilma Meckel, am serving as a scribe for Dr. Hulan Saas.  I'm seeing this patient by the request  of:  [name], MD  CC: Left hip pain  FKC:[id number]  01/11/2022 Patient given injection today.  Concern for potential calcific changes, awaiting x-rays.  I do not think that there is underlying arthritic changes.  Discussed home exercises and will refer to formal physical therapy which I think will be significantly beneficial especially dry needling and other modalities.  Patient will follow-up with me again in 6 to 8 weeks otherwise.  Update 04/02/2022 Keilah Lemire Cavazos is a 61 y.o. female coming in with complaint of L hip pain. Has been doing PT. x-rays did show the patient did have mild arthritic changes noted.  Patient states doing better. Hip pain has gotten better since doing PT. Injection didn't help much.     Past Medical History:  Diagnosis Date   Arthritis    Chronic fatigue syndrome    Diverticulosis    External hemorrhoids    Hypertension    Internal hemorrhoids    Neuropathy    Spinal stenosis    Past Surgical History:  Procedure Laterality Date   CARDIAC CATHETERIZATION  09/18/2012   LEFT HEART CATHETERIZATION WITH CORONARY ANGIOGRAM N/A 09/18/2012   Procedure: LEFT HEART CATHETERIZATION WITH CORONARY ANGIOGRAM;  Surgeon: Sherren Mocha, MD;  Location: [facility] CATH LAB;  Service: Cardiovascular;  Laterality: N/A;   TONSILLECTOMY  1965   Social History   Socioeconomic History   Marital status: Married    Spouse name: Not on file   Number of children: 2   Years of education: Not on file   Highest education level: Not on file  Occupational History   Occupation: Therapist    Employer: [employer]  Tobacco Use   Smoking status: Never   Smokeless tobacco: Never  Substance and Sexual Activity   Alcohol use: No   Drug use: No   Sexual  activity: Yes  Other Topics Concern   Not on file  Social History Narrative   Married. 2 adopted children- adopted daughter (71) when single after first marriage from [place]. Son from [place] with 2nd husband(18) in 2021. 1st husband local physician Linus Mako      LCSW with [employer]      Hobbies: animal rescue- has litter of kittens in 2021, walking, time outside   Social Determinants of Health   Financial Resource Strain: Not on file  Food Insecurity: Not on file  Transportation Needs: Not on file  Physical Activity: Not on file  Stress: Not on file  Social Connections: Not on file   Allergies  Allergen Reactions   Eggs Or Egg-Derived Products Other (See Comments)    Makes pt feel like burping up rotten eggs   Family History  Problem Relation Age of Onset   Heart attack Maternal Grandmother 37   Arthritis Maternal Grandmother    Hyperlipidemia Maternal Grandmother    Heart disease Maternal Grandmother    Diabetes Maternal Grandmother    Pancreatic cancer Mother        died 53. 2.5 years past diagnosis.     Anxiety disorder Mother    Depression Mother    Colon polyps Father    Healthy Sister    Healthy Brother    Depression Brother    Heart attack Brother  age 37   Suicidality Maternal Grandfather    Colon cancer Neg Hx    Esophageal cancer Neg Hx    Kidney disease Neg Hx       Reviewed prior external information including notes and imaging from  primary care provider As well as notes that were available from care everywhere and other healthcare systems.  Past medical history, social, surgical and family history all reviewed in electronic medical record.  No pertanent information unless stated regarding to the chief complaint.   Review of Systems:  No headache, visual changes, nausea, vomiting, diarrhea, constipation, dizziness, abdominal pain, skin rash, fevers, chills, night sweats, weight loss, swollen lymph nodes, body aches, joint swelling,  chest pain, shortness of breath, mood changes. POSITIVE muscle aches  Objective  Blood pressure 108/68, pulse 91, height '5\' 1"'$  (1.549 m), weight 154 lb (69.9 kg), last menstrual period 11/05/2007, SpO2 96 %.   General: No apparent distress alert and oriented x3 mood and affect normal, dressed appropriately.  HEENT: Pupils equal, extraocular movements intact  Respiratory: Patient's speak in full sentences and does not appear short of breath  Cardiovascular: No lower extremity edema, non tender, no erythema  Left hip exam shows the patient still has some tightness of the hip flexor noted.  Tightness with FABER test.  Still some mild discomfort noted over the lateral aspect of the greater trochanteric area.  Negative straight leg test.  Osteopathic findings  T4 extended rotated and side bent right L3 flexed rotated and side bent right Sacrum left on left    Impression and Recommendations:    The above documentation has been reviewed and is accurate and complete [name], DO

## 2022-04-02 ENCOUNTER — Ambulatory Visit (HOSPITAL_BASED_OUTPATIENT_CLINIC_OR_DEPARTMENT_OTHER): Payer: 59 | Admitting: Physical Therapy

## 2022-04-02 ENCOUNTER — Ambulatory Visit (INDEPENDENT_AMBULATORY_CARE_PROVIDER_SITE_OTHER): Payer: 59 | Admitting: Family Medicine

## 2022-04-02 ENCOUNTER — Encounter (HOSPITAL_BASED_OUTPATIENT_CLINIC_OR_DEPARTMENT_OTHER): Payer: Self-pay | Admitting: Physical Therapy

## 2022-04-02 VITALS — BP 108/68 | HR 91 | Ht 61.0 in | Wt 154.0 lb

## 2022-04-02 DIAGNOSIS — M9903 Segmental and somatic dysfunction of lumbar region: Secondary | ICD-10-CM | POA: Diagnosis not present

## 2022-04-02 DIAGNOSIS — M9904 Segmental and somatic dysfunction of sacral region: Secondary | ICD-10-CM

## 2022-04-02 DIAGNOSIS — M25552 Pain in left hip: Secondary | ICD-10-CM

## 2022-04-02 DIAGNOSIS — M999 Biomechanical lesion, unspecified: Secondary | ICD-10-CM

## 2022-04-02 DIAGNOSIS — R262 Difficulty in walking, not elsewhere classified: Secondary | ICD-10-CM | POA: Diagnosis not present

## 2022-04-02 DIAGNOSIS — M6281 Muscle weakness (generalized): Secondary | ICD-10-CM | POA: Diagnosis not present

## 2022-04-02 DIAGNOSIS — M24552 Contracture, left hip: Secondary | ICD-10-CM | POA: Insufficient documentation

## 2022-04-02 DIAGNOSIS — M9902 Segmental and somatic dysfunction of thoracic region: Secondary | ICD-10-CM | POA: Diagnosis not present

## 2022-04-02 NOTE — Therapy (Signed)
OUTPATIENT PHYSICAL THERAPY LOWER EXTREMITY Treatment   Patient Name: Tammy [name] MRN: [id number] DOB:1960/11/02, 61 y.o., female Today's Date: 04/02/2022   PT End of Session - 04/02/22 0853     Visit Number 14    Number of Visits 16    Date for PT Re-Evaluation 05/03/22    Authorization Type [employer]    PT Start Time 0802    PT Stop Time 0847    PT Time Calculation (min) 45 min    Activity Tolerance Patient tolerated treatment well    Behavior During Therapy [facility] for tasks assessed/performed                         Past Medical History:  Diagnosis Date   Arthritis    Chronic fatigue syndrome    Diverticulosis    External hemorrhoids    Hypertension    Internal hemorrhoids    Neuropathy    Spinal stenosis    Past Surgical History:  Procedure Laterality Date   CARDIAC CATHETERIZATION  09/18/2012   LEFT HEART CATHETERIZATION WITH CORONARY ANGIOGRAM N/A 09/18/2012   Procedure: LEFT HEART CATHETERIZATION WITH CORONARY ANGIOGRAM;  Surgeon: Sherren Mocha, MD;  Location: [facility] CATH LAB;  Service: Cardiovascular;  Laterality: N/A;   TONSILLECTOMY  1965   Patient Active Problem List   Diagnosis Date Noted   Greater trochanteric bursitis of left hip 01/11/2022   Anxiety 07/24/2020   Nonallopathic lesion of cervical region 04/03/2020   Nonallopathic lesion of thoracic region 04/03/2020   Nonallopathic lesion of lumbar region 04/03/2020   Cervical stenosis of spine 03/31/2020   Attention deficit disorder (ADD) in adult 03/31/2020   Low bone density 03/17/2020   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[place]) 02/15/2020   GERD (gastroesophageal reflux disease) 02/15/2020   Allergic rhinitis 03/09/2016   Essential hypertension 02/18/2014   Chronic pain syndrome 02/22/2013   Chronic insomnia 02/22/2013   Paresthesias 10/25/2012   Hyperlipidemia 09/18/2012    PCP: [name], MD  REFERRING PROVIDER: [name], DO  REFERRING DIAG:  [phone] (ICD-10-CM) - Left hip pain  THERAPY DIAG:  Pain in left hip  Difficulty walking  Muscle weakness (generalized)  ONSET DATE: Fall 2022  SUBJECTIVE:   SUBJECTIVE STATEMENT: Pt states she was able to walk about 40 mins with a walking stick yesterday. She had pain into the anterior thigh afterwards but was able to complete her longer walk at her same pace.   PERTINENT HISTORY: Depression, spinal stenosis, HTN, chronic pain  PAIN:  Are you having pain? No: NPRS scale: 0/10 Pain location: lower back, ant thigh, posterior hip Pain description: "tight Aggravating factors: walking, hip ER, stairs, crossing the legs  Relieving factors: swimming, hot tub,   PRECAUTIONS: None  WEIGHT BEARING RESTRICTIONS No  FALLS:  Has patient fallen in last 6 months? 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 No acute findings.  Hip Xray FINDINGS: Mildly decreased bone mineralization. Minimal superior left femoroacetabular joint space narrowing. Mild superolateral left acetabular degenerative osteophytosis. No significant degenerative change within the right hip, pubic symphysis, or sacroiliac joints. No acute fracture or dislocation.   IMPRESSION:  Mild left femoroacetabular osteoarthritis.    PATIENT SURVEYS:  LEFS Lower Extremity Functional Score: 46 / 80 = 57.5 %  Lower Extremity Functional Score: 64 / 80 = 80.0 %    PALPATION:  Mild L hip joint tightness at end range flexion; springy end feel vs firm  LE ROM:  Passive ROM Right 02/02/2022 Left 02/02/2022 L 6/27  Hip flexion  WFL 110 120  Hip extension WFL 0 5  Hip abduction WFL WNL WNL  Hip adduction WFL WNL WNL  Hip internal rotation WFL WNL p! WNL  Hip external rotation WFL WNL p! WNL   (Blank rows = not tested)  LE MMT:  MMT Right 02/02/2022 Left 02/02/2022 L 6/27  Hip flexion 5/5 4+/5 p!  4+/5  Hip extension 5/5 4+/5 5/5  Hip abduction 5/5 5/5 5/5  Hip adduction 5/5 5/5 5/5  Knee extension 5/5 5/5 5/5   (Blank rows = not tested)   03/25/22: notable post Lt innom rotation in supine with functional LLD  TODAY'S TREATMENT:  7/14 L hip Mulligan belt mob grade IV lateral and inf, IR/ER mob with movement grade IV; LAD grade IV S/L ext mob grade IV  Gray Leg press 25lbs 3x8 Seated knee extension 35lbs 3x8 Single leg bridge 2x10 RTB at ankles monster walk fwd and retro 68fx2 Step up 8" box 2x10 fwd and lateral 15lb KB  Fwd lunge at silver bar 2x10 bilat (no UE support)   7/11 L hip Mulligan belt mob grade IV lateral and inf, IR/ER mob with movement grade IV; LAD grade IV S/L ext mob grade IV  Single leg bridge 2x10 RDL 10lbs 3x10 RTB at ankles monster walk fwd and retro 350f2 Step up 8" box 2x10 fwd 15lb KB  Fwd lunge at silver bar 2x10 bilat  Goblet squat to low table height 3x10 15lb KB   7/6  MANUAL: roller Lt lateral thigh, STM Lt pectineus, hip mobs- LAD and lateral in hooklying  Hesch self correction for Lt post innom rotation- advised of leg off of table as well as Rt single knee to chest  Hooklying core/pelvic floor contraction with breathing Bridges Clams  Contractions with exhale and lift   6/29   L hip Mulligan belt mob grade IV lateral and inf, IR/ER mob with movement grade IV; LAD grade IV S/L ext mob grade IV  Self hip extension mob with black TB pulling ant 5s hold 15x Prone hip flexor stretch with towel prop under knee 30s 3x  RTB at ankles monster walk fwd and retro 3554f  Step up 8" box 2x10 fwd 15lb KB  8" lateral step  2x10 Shuttle leg press staggered  stance 125 lbs 3x10 Lateral shuttle leg press 2x10 50lbs  Fwd lunge at silver bar 2x10 bilat (longer stride length) Goblet squat to low table height 3x10 15lb KB   6/27     L hip Mulligan belt mob grade IV lateral and inf, IR/ER mob with movement grade IV; LAD grade IV S/L ext mob grade IV     RTB at ankles monster walk fwd and retro [id number] up 8" box 2x10 lateral Supine Hip flexor march YTB at toe box 2x10 90/90 position Shuttle leg press staggered stance 118lbs 3x10 Lateral shuttle leg press 2x10 50lbs  Fwd lunge at silver bar 2x10 bilat  6/23  L hip Mulligan belt mob grade IV lateral and inf, IR/ER mob with movement grade IV; LAD grade IV S/L ext mob grade IV  Thomas stretch  edge of bed with strap 30s 3x Sidestepping RTB 47fx2 laps RTB at ankles monster walk fwd and retro 343f2 Figure 4 supine stretch (hands through window) 30s 3x Step up 8" box 2x10 fwd and lateral white leg press staggered stance 3x10 75lbs    PATIENT EDUCATION:  Education details:  programming, acceptable levels of pain, exercise progression, muscle firing,  envelope of function, HEP, POC   Person educated: Patient Education method: Explanation, Demonstration, Tactile cues, Verbal cues, and Handouts Education comprehension: verbalized understanding, returned demonstration, verbal cues required, and tactile cues required   HOME EXERCISE PROGRAM: Access Code: [email or web address]: [email or web address] Date: 02/02/2022   ASSESSMENT:  CLINICAL IMPRESSION: Pt able to progress intensity of exercise at today's session without pain today. Pt advised on RPE/RIR 7 for building up intensity of exercise in order to promote strength adaptations. Pt also given edu about programming intensity, volume, and frequency in order to reduce irritation of the L hip. Pt did have improved hip mobility following joint mobilizations and able to perform CKC exercise with external load. Pt to trial decreased  frequency of visits for transition to independent management. Pt to see MD today for follow up. Pt would benefit from continued skilled therapy in order to reach goals and maximize functional L LE strength and ROM for full return to PLOF.    OBJECTIVE IMPAIRMENTS Abnormal gait, decreased activity tolerance, decreased mobility, difficulty walking, decreased ROM, decreased strength, hypomobility, increased fascial restrictions, increased muscle spasms, impaired flexibility, impaired sensation, improper body mechanics, postural dysfunction, and pain.   ACTIVITY LIMITATIONS cleaning, community activity, occupation, yard work, shopping, and exercise .   PERSONAL FACTORS Age, Behavior pattern, Past/current experiences, Time since onset of injury/illness/exacerbation, and 1-2 comorbidities:    are also affecting patient's functional outcome.    REHAB POTENTIAL: Good  CLINICAL DECISION MAKING: Stable/uncomplicated  EVALUATION COMPLEXITY: Low   GOALS:  SHORT TERM GOALS: Target date: 03/16/2022   Pt will become independent with HEP in order to demonstrate synthesis of PT education.  Goal status: MET  2.  Pt will be able to demonstrate active hip extension during gait without pain in order to demonstrate functional improvement in LE function for self-care and house hold duties.   Goal status: MET  3.  Pt will report at least 2 pt reduction on NPRS scale for pain during walking in order to demonstrate functional improvement with household activity, self care, and ADL.  Goal status: MET    LONG TERM GOALS: Target date: 04/27/2022    Pt  will become independent with final HEP in order to demonstrate synthesis of PT education.  Goal status: ongoing  2.  Pt will be able to demonstrate/report ability to walk >40 mins without pain in order to demonstrate functional improvement and tolerance to exercise and community mobility.   Goal status: ongoing  3.  Pt will have an at least 18 pt  improvement in LEFS measure in order to demonstrate MCID improvement in daily function.  Goal status: MET  4.  Pt will be able to demonstrate ability to perform reciprocal stair stepping without pain or discomfort in order to demonstrate functional improvement in LE function for daily mobility and house hold duties.   Goal status: MET   PLAN: PT FREQUENCY: 1-2x/week  PT DURATION: 12 weeks (likely DC by 8wks)  PLANNED INTERVENTIONS: Therapeutic exercises, Therapeutic activity, Neuromuscular re-education, Balance training, Gait training, Patient/Family education, Joint manipulation, Joint mobilization, Stair training, Orthotic/Fit training, DME instructions, Aquatic Therapy, Dry Needling, Electrical stimulation,  Spinal manipulation, Spinal mobilization, Cryotherapy, Moist heat, scar mobilization, Taping, Vasopneumatic device, Traction, Ultrasound, Ionotophoresis 36m/ml Dexamethasone, Manual therapy, and Re-evaluation  PLAN FOR NEXT SESSION: continue lumbopelvic stability and manual to Lt ant thigh/hip  ADaleen BoPT, DPT 04/02/22 8:54 AM

## 2022-04-02 NOTE — Patient Instructions (Signed)
Good to see you! Think you're making great progression You an increase activity See you again in 2 months

## 2022-04-02 NOTE — Assessment & Plan Note (Signed)

## 2022-04-02 NOTE — Assessment & Plan Note (Signed)
Patient has been working with physical therapy.  Making some progress.  Still has some tightness of the hip flexor and some weakness of the hip abductor.  Discussed with patient to continue to work on this.  Attempted some osteopathic manipulation with some mild improvement.  Discussed which activities to do and which ones to avoid.  Increase activity again and follow-up with me again in 6 to 8 weeks

## 2022-04-09 ENCOUNTER — Other Ambulatory Visit (HOSPITAL_COMMUNITY): Payer: Self-pay

## 2022-04-12 DIAGNOSIS — F324 Major depressive disorder, single episode, in partial remission: Secondary | ICD-10-CM | POA: Diagnosis not present

## 2022-04-12 DIAGNOSIS — K219 Gastro-esophageal reflux disease without esophagitis: Secondary | ICD-10-CM | POA: Diagnosis not present

## 2022-04-12 DIAGNOSIS — G894 Chronic pain syndrome: Secondary | ICD-10-CM | POA: Diagnosis not present

## 2022-04-12 DIAGNOSIS — E785 Hyperlipidemia, unspecified: Secondary | ICD-10-CM | POA: Diagnosis not present

## 2022-04-12 DIAGNOSIS — E669 Obesity, unspecified: Secondary | ICD-10-CM | POA: Diagnosis not present

## 2022-04-12 DIAGNOSIS — F900 Attention-deficit hyperactivity disorder, predominantly inattentive type: Secondary | ICD-10-CM | POA: Diagnosis not present

## 2022-04-15 DIAGNOSIS — F411 Generalized anxiety disorder: Secondary | ICD-10-CM | POA: Diagnosis not present

## 2022-04-15 DIAGNOSIS — F332 Major depressive disorder, recurrent severe without psychotic features: Secondary | ICD-10-CM | POA: Diagnosis not present

## 2022-04-20 ENCOUNTER — Encounter (HOSPITAL_BASED_OUTPATIENT_CLINIC_OR_DEPARTMENT_OTHER): Payer: Self-pay | Admitting: Physical Therapy

## 2022-04-20 ENCOUNTER — Ambulatory Visit (HOSPITAL_BASED_OUTPATIENT_CLINIC_OR_DEPARTMENT_OTHER): Payer: 59 | Attending: Family Medicine | Admitting: Physical Therapy

## 2022-04-20 DIAGNOSIS — M6281 Muscle weakness (generalized): Secondary | ICD-10-CM | POA: Insufficient documentation

## 2022-04-20 DIAGNOSIS — M25552 Pain in left hip: Secondary | ICD-10-CM | POA: Diagnosis not present

## 2022-04-20 DIAGNOSIS — R262 Difficulty in walking, not elsewhere classified: Secondary | ICD-10-CM | POA: Insufficient documentation

## 2022-04-20 NOTE — Therapy (Signed)
OUTPATIENT PHYSICAL THERAPY LOWER EXTREMITY Treatment   Patient Name: Tammy [name] MRN: [id number] DOB:Sep 05, 1961, 61 y.o., female Today's Date: 04/20/2022   PT End of Session - 04/20/22 0900     Visit Number 15    Number of Visits 16    Date for PT Re-Evaluation 05/03/22    Authorization Type [employer]    PT Start Time 0805    PT Stop Time 0845    PT Time Calculation (min) 40 min    Activity Tolerance Patient tolerated treatment well    Behavior During Therapy [facility] for tasks assessed/performed                          Past Medical History:  Diagnosis Date   Arthritis    Chronic fatigue syndrome    Diverticulosis    External hemorrhoids    Hypertension    Internal hemorrhoids    Neuropathy    Spinal stenosis    Past Surgical History:  Procedure Laterality Date   CARDIAC CATHETERIZATION  09/18/2012   LEFT HEART CATHETERIZATION WITH CORONARY ANGIOGRAM N/A 09/18/2012   Procedure: LEFT HEART CATHETERIZATION WITH CORONARY ANGIOGRAM;  Surgeon: Sherren Mocha, MD;  Location: [facility] CATH LAB;  Service: Cardiovascular;  Laterality: N/A;   TONSILLECTOMY  1965   Patient Active Problem List   Diagnosis Date Noted   Hip flexor tendon tightness, left 04/02/2022   Greater trochanteric bursitis of left hip 01/11/2022   Anxiety 07/24/2020   Nonallopathic lesion of cervical region 04/03/2020   Nonallopathic lesion of thoracic region 04/03/2020   Nonallopathic lesion of lumbar region 04/03/2020   Cervical stenosis of spine 03/31/2020   Attention deficit disorder (ADD) in adult 03/31/2020   Low bone density 03/17/2020   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[place]) 02/15/2020   GERD (gastroesophageal reflux disease) 02/15/2020   Allergic rhinitis 03/09/2016   Essential hypertension 02/18/2014   Chronic pain syndrome 02/22/2013   Chronic insomnia 02/22/2013   Paresthesias 10/25/2012   Hyperlipidemia 09/18/2012    PCP: [name], MD  REFERRING  PROVIDER: [name], DO  REFERRING DIAG: [phone] (ICD-10-CM) - Left hip pain  THERAPY DIAG:  Pain in left hip  Difficulty walking  Muscle weakness (generalized)  ONSET DATE: Fall 2022  SUBJECTIVE:   SUBJECTIVE STATEMENT: Pt states she was able to do her normal walk 1 day but the 2nd attempt started to progressively hurt more. She states it feels tight and locking up.   PERTINENT HISTORY: Depression, spinal stenosis, HTN, chronic pain  PAIN:  Are you having pain? No: NPRS scale: 0/10 Pain location: lower back, ant thigh, posterior hip Pain description: "tight Aggravating factors: walking, hip ER, stairs, crossing the legs  Relieving factors: swimming, hot tub,   PRECAUTIONS: None  WEIGHT BEARING RESTRICTIONS No  FALLS:  Has patient fallen in last 6 months? 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 No acute findings.  Hip Xray FINDINGS: Mildly decreased bone mineralization. Minimal superior left femoroacetabular joint space narrowing. Mild superolateral left acetabular degenerative osteophytosis. No significant degenerative change within the right hip, pubic symphysis, or sacroiliac joints. No acute fracture or dislocation.  IMPRESSION: Mild left femoroacetabular osteoarthritis.    PATIENT SURVEYS:  LEFS Lower Extremity Functional Score: 46 / 80 = 57.5 %  Lower Extremity Functional Score: 64 / 80 = 80.0 %    PALPATION:  Mild L hip joint tightness at end range flexion; springy end feel vs firm  LE ROM:  Passive ROM Right 02/02/2022 Left 02/02/2022 L 6/27   Hip flexion WFL 110 120  Hip extension WFL 0 5  Hip abduction WFL WNL WNL  Hip adduction WFL WNL WNL  Hip internal rotation WFL WNL p! WNL  Hip external rotation WFL WNL p! WNL   (Blank rows = not tested)  LE MMT:  MMT Right 02/02/2022 Left 02/02/2022 L 6/27  Hip flexion 5/5 4+/5 p!  4+/5  Hip extension 5/5 4+/5 5/5  Hip abduction 5/5 5/5 5/5  Hip adduction 5/5 5/5 5/5  Knee extension 5/5 5/5 5/5   (Blank rows = not tested)   8/1: start hip flexion AROM 95, after treatment 110 AROM  TODAY'S TREATMENT:  8/1 L hip Mulligan belt mob grade IV lateral and inf, IR/ER mob with movement grade IV; LAD grade IV S/L ext mob grade IV  Self stair distraction stretch 5lb ankle weight with leg swinging  Self hip quadruped mobilization with black band anchored to table- flexion, flexion with SB, flexion with IR and ER Deep squat hold at bar in pain free range  Activity modification with yardwork, pre-walking warm up, daily mobility stretching, rest/recovery times  7/14 L hip Mulligan belt mob grade IV lateral and inf, IR/ER mob with movement grade IV; LAD grade IV S/L ext mob grade IV  Gray Leg press 25lbs 3x8 Seated knee extension 35lbs 3x8 Single leg bridge 2x10 RTB at ankles monster walk fwd and retro 29fx2 Step up 8" box 2x10 fwd and lateral 15lb KB  Fwd lunge at silver bar 2x10 bilat (no UE support)   7/11 L hip Mulligan belt mob grade IV lateral and inf, IR/ER mob with movement grade IV; LAD grade IV S/L ext mob grade IV  Single leg bridge 2x10 RDL 10lbs 3x10 RTB at ankles monster walk fwd and retro 374f2 Step up 8" box 2x10 fwd 15lb KB  Fwd lunge at silver bar 2x10 bilat  Goblet squat to low table height 3x10 15lb KB   7/6  MANUAL: roller Lt lateral thigh, STM Lt pectineus, hip mobs- LAD and lateral in hooklying  Hesch self correction for Lt post innom rotation- advised of leg off of table as well as Rt single knee to chest  Hooklying core/pelvic floor  contraction with breathing Bridges Clams  Contractions with exhale and lift   6/29   L hip Mulligan belt mob grade IV lateral and inf, IR/ER mob with movement grade IV; LAD grade IV S/L ext mob grade IV  Self hip extension mob with black TB pulling ant 5s hold 15x Prone hip flexor stretch with towel prop under knee 30s 3x  RTB at ankles monster walk fwd and retro 3547f  Step up 8" box 2x10 fwd 15lb KB  8" lateral step  2x10 Shuttle leg press staggered stance 125 lbs 3x10 Lateral shuttle leg press 2x10 50lbs  Fwd lunge at silver bar 2x10 bilat (longer stride length) Goblet squat to low table height 3x10 15lb KB   6/27     L hip Mulligan belt mob grade IV lateral and inf, IR/ER mob with movement grade IV; LAD grade IV S/L ext mob grade IV  RTB at ankles monster walk fwd and retro 44fx2 Step up 8" box 2x10 lateral Supine Hip flexor march YTB at toe box 2x10 90/90 position Shuttle leg press staggered stance 118lbs 3x10 Lateral shuttle leg press 2x10 50lbs  Fwd lunge at silver bar 2x10 bilat  6/23  L hip Mulligan belt mob grade IV lateral and inf, IR/ER mob with movement grade IV; LAD grade IV S/L ext mob grade IV  Thomas stretch edge of bed with strap 30s 3x Sidestepping RTB 366f2 laps RTB at ankles monster walk fwd and retro 357f Figure 4 supine stretch (hands through window) 30s 3x Step up 8" box 2x10 fwd and lateral white leg press staggered stance 3x10 75lbs    PATIENT EDUCATION:  Education details:  programming, acceptable levels of pain, exercise progression, muscle firing,  envelope of function, HEP, POC   Person educated: Patient Education method: Explanation, Demonstration, Tactile cues, Verbal cues, and Handouts Education comprehension: verbalized understanding, returned demonstration, verbal cues required, and tactile cues required   HOME EXERCISE PROGRAM: Access Code: [email or web address]: [email or web address] Date:  02/02/2022   ASSESSMENT:  CLINICAL IMPRESSION: Pt with improved hip flexion and ER position following manual therapy and self stretching/mobility. Pt session focused on demo'ing self mobilization and hip stretching techniques so that pt is able to independently manage L hip joint stiffness and OA moving forward. Pt has been able to return to previous level of walking, per pt report, but is unable to sustain it for multiple sessions. Hopefully, with self mob techniques and activity modification edu, pt will be independently relieve hip tightness and motion deficits between PT sessions. Plan for D/C at next session if no exacerbation of pain of complications with hip. Plan to do personal training referral following PT. Pt would benefit from continued skilled therapy in order to reach goals and maximize functional L LE strength and ROM for full return to PLOF.    OBJECTIVE IMPAIRMENTS Abnormal gait, decreased activity tolerance, decreased mobility, difficulty walking, decreased ROM, decreased strength, hypomobility, increased fascial restrictions, increased muscle spasms, impaired flexibility, impaired sensation, improper body mechanics, postural dysfunction, and pain.   ACTIVITY LIMITATIONS cleaning, community activity, occupation, yard work, shopping, and exercise .   PERSONAL FACTORS Age, Behavior pattern, Past/current experiences, Time since onset of injury/illness/exacerbation, and 1-2 comorbidities:    are also affecting patient's functional outcome.    REHAB POTENTIAL: Good  CLINICAL DECISION MAKING: Stable/uncomplicated  EVALUATION COMPLEXITY: Low   GOALS:  SHORT TERM GOALS: Target date: 03/16/2022   Pt will become independent with HEP in order to demonstrate synthesis of PT education.  Goal status: MET  2.  Pt will be able to demonstrate active hip extension during gait without pain in order to demonstrate functional improvement in LE function for self-care and house hold  duties.   Goal status: MET  3.  Pt will report at least 2 pt reduction on NPRS scale for pain during walking in order to demonstrate functional improvement with household activity, self care, and ADL.  Goal status: MET    LONG TERM GOALS: Target date: 04/27/2022    Pt  will become independent with final HEP in order to demonstrate synthesis of PT education.  Goal status: ongoing  2.  Pt will be able to demonstrate/report ability to walk >40 mins without pain in order to demonstrate functional improvement and tolerance to exercise and community mobility.   Goal status: ongoing  3.  Pt will have an at least 18 pt improvement in LEFS measure  in order to demonstrate MCID improvement in daily function.  Goal status: MET  4.  Pt will be able to demonstrate ability to perform reciprocal stair stepping without pain or discomfort in order to demonstrate functional improvement in LE function for daily mobility and house hold duties.   Goal status: MET   PLAN: PT FREQUENCY: 1-2x/week  PT DURATION: 12 weeks (likely DC by 8wks)  PLANNED INTERVENTIONS: Therapeutic exercises, Therapeutic activity, Neuromuscular re-education, Balance training, Gait training, Patient/Family education, Joint manipulation, Joint mobilization, Stair training, Orthotic/Fit training, DME instructions, Aquatic Therapy, Dry Needling, Electrical stimulation, Spinal manipulation, Spinal mobilization, Cryotherapy, Moist heat, scar mobilization, Taping, Vasopneumatic device, Traction, Ultrasound, Ionotophoresis 35m/ml Dexamethasone, Manual therapy, and Re-evaluation  PLAN FOR NEXT SESSION: continue lumbopelvic stability and manual to Lt ant thigh/hip  ADaleen BoPT, DPT 04/20/22 9:16 AM

## 2022-04-22 NOTE — Therapy (Signed)
OUTPATIENT PHYSICAL THERAPY FEMALE PELVIC EVALUATION   Patient Name: Tammy [name] MRN: [id number] DOB:1960-11-02, 61 y.o., female Today's Date: 04/23/2022   PT End of Session - 04/23/22 0803     Visit Number 1   pelvic   Date for PT Re-Evaluation 56/38/75   cert for hip is 6/43/3295   Authorization Type [employer]    Authorization - Visit Number 15    Authorization - Number of Visits 27    PT Start Time 0800    PT Stop Time 0838    PT Time Calculation (min) 38 min    Activity Tolerance Patient tolerated treatment well    Behavior During Therapy [facility] for tasks assessed/performed             Past Medical History:  Diagnosis Date   Arthritis    Chronic fatigue syndrome    Diverticulosis    External hemorrhoids    Hypertension    Internal hemorrhoids    Neuropathy    Spinal stenosis    Past Surgical History:  Procedure Laterality Date   CARDIAC CATHETERIZATION  09/18/2012   LEFT HEART CATHETERIZATION WITH CORONARY ANGIOGRAM N/A 09/18/2012   Procedure: LEFT HEART CATHETERIZATION WITH CORONARY ANGIOGRAM;  Surgeon: Sherren Mocha, MD;  Location: [facility] CATH LAB;  Service: Cardiovascular;  Laterality: N/A;   TONSILLECTOMY  1965   Patient Active Problem List   Diagnosis Date Noted   Hip flexor tendon tightness, left 04/02/2022   Greater trochanteric bursitis of left hip 01/11/2022   Anxiety 07/24/2020   Nonallopathic lesion of cervical region 04/03/2020   Nonallopathic lesion of thoracic region 04/03/2020   Nonallopathic lesion of lumbar region 04/03/2020   Cervical stenosis of spine 03/31/2020   Attention deficit disorder (ADD) in adult 03/31/2020   Low bone density 03/17/2020   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[place]) 02/15/2020   GERD (gastroesophageal reflux disease) 02/15/2020   Allergic rhinitis 03/09/2016   Essential hypertension 02/18/2014   Chronic pain syndrome 02/22/2013   Chronic insomnia 02/22/2013   Paresthesias 10/25/2012   Hyperlipidemia  09/18/2012    PCP: [name], MD  REFERRING PROVIDER: Jerene Bears, MD  REFERRING DIAG: K62.3 (ICD-10-CM) - Rectal prolapse THERAPY DIAG:  Muscle weakness (generalized)  Other lack of coordination  Rationale for Evaluation and Treatment Rehabilitation  ONSET DATE: 04/23/2021  SUBJECTIVE:                                                                                                                                                                                           SUBJECTIVE STATEMENT: Patient noticed her recral prolapse 1 year ago.  Fluid  intake: Yes: water     PAIN:  Are you having pain? No  PRECAUTIONS: None  WEIGHT BEARING RESTRICTIONS No  FALLS:  Has patient fallen in last 6 months? 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  PLOF: Independent  PATIENT GOALS  education on rectal prolapse management and strengthen PERTINENT HISTORY:  Chronic fatigue syndrome, Diverticulosis, External and internal hemorrhoids  BOWEL MOVEMENT Pain with bowel movement: No Type of bowel movement:Type (Bristol Stool Scale) Type 5 or 6, Frequency daily, Strain Yes, and Splinting no; feels like straining when the prolapse is out Fully empty rectum: Yes:   Leakage: No, mucous leakage Pads: No Fiber supplement: Yes: magnesium , uses glycerin suppositories every morning    PROLAPSE Rectal prolapse   OBJECTIVE:   DIAGNOSTIC FINDINGS:  none  PATIENT SURVEYS:  none  COGNITION:  Overall cognitive status: Within functional limits for tasks assessed     SENSATION:  Light touch: Appears intact  Proprioception: Appears intact                POSTURE: No Significant postural limitations   PELVIC ALIGNMENT:  LUMBARAROM/PROM  A/PROM A/PROM  eval  Flexion full  Extension Decreased by 25%  Right lateral flexion Decreased by 25%  Left lateral flexion Decreased by 25%  Right rotation  Decreased by 25%  Left rotation Decreased by 25%   (Blank rows = not tested)  LOWER EXTREMITY ROM:  Passive ROM Right eval Left eval  Hip flexion    Hip extension    Hip abduction    Hip adduction    Hip internal rotation    Hip external rotation    Knee flexion    Knee extension    Ankle dorsiflexion    Ankle plantarflexion    Ankle inversion    Ankle eversion     (Blank rows = not tested)  LOWER EXTREMITY MMT:  MMT Right eval Left eval  Hip flexion    Hip extension    Hip abduction    Hip adduction    Hip internal rotation    Hip external rotation    Knee flexion    Knee extension    Ankle dorsiflexion    Ankle plantarflexion    Ankle inversion    Ankle eversion      PALPATION:   General  Patient was not able to push the therapist finger out of the anal canal                 External Perineal Exam tightness in the anal sphincter, perineal body, had hemorrhoids were apparent                             Internal Pelvic Floor thickness on the anterior rectum, thickness along the internal anal sphincter, decreased movement of the puborectalis, tenderness located on the levator ani  Patient confirms identification and approves PT to assess internal pelvic floor and treatment Yes  PELVIC MMT:   MMT eval  Vaginal   Internal Anal Sphincter Ant. Is 1/5; post. 2/5  External Anal Sphincter Ant. Is 1/5; post. 2/5  Puborectalis 1/5  (Blank rows = not tested)        TONE: increased  PROLAPSE: Rectal prolapse  TODAY'S TREATMENT  EVAL Date: 04/23/2022 HEP established-see below    PATIENT EDUCATION:  04/23/2022 Education details: educated patient on vaginal mositurizers and contract  the anus 5 times after bowel movement Person educated: Patient Education method: Explanation, Demonstration, Tactile cues, Verbal cues, and Handouts Education comprehension: verbalized understanding, returned demonstration, verbal cues required, tactile cues required, and needs  further education   HOME EXERCISE PROGRAM: See above  ASSESSMENT:  CLINICAL IMPRESSION: Patient is a 61 y.o. female who was seen today for physical therapy evaluation and treatment for rectal prolapse. Patient reports she noticed a prolapse 1 year ago. Patient has type 5 or 6 stool. She will strain to have a bowel movement due to the prolapse is out of the rectum giving her the sensation there is still stool present. Internal and external anal sphincter strength is 1/5 anteriorly and 2/5 posteriorly. The puborectalis strength is 1/5. She has thickness in the anterior wall of the rectum and along the internal anal sphincter. She has tightness in the perineal area and the outside to the rectum. She has an external hemorrhoid present.  Patient reports she has vaginal dryness and this was addressed at the eval to improve the health of the tissue. Patient reports at times she will have mucous that comes from the rectum. Patient was not able to push the therapist finger out of the anal canal. Patient will benefit from skilled therapy to improve health of tissue, coordination of the muscles and strength.    OBJECTIVE IMPAIRMENTS decreased coordination, decreased endurance, decreased strength, increased fascial restrictions, and impaired tone.   ACTIVITY LIMITATIONS toileting  PARTICIPATION LIMITATIONS:  toileting  PERSONAL FACTORS Fitness are also affecting patient's functional outcome.   REHAB POTENTIAL: Excellent  CLINICAL DECISION MAKING: Stable/uncomplicated  EVALUATION COMPLEXITY: Low   GOALS: Goals reviewed with patient? Yes  SHORT TERM GOALS: Target date: 05/21/2022  Patient independent with massage of the external anus to relax the tissue.  Baseline: Goal status: INITIAL  2.  Patient able to perform diaphragmatic breathing to relax the anal tissue.  Baseline:  Goal status: INITIAL  3.  Patient reduced her straining with a bowel movement due to contracting the pelvic floor  afterwards.  Baseline:  Goal status: INITIAL   LONG TERM GOALS: Target date: 07/16/2022   Patient is independent with advanced HEP for pelvic floor.  Baseline:  Goal status: INITIAL  2.  Patient is able to have a bowel movement without straining due to improved coordination of the pelvic floor muscles.  Baseline:  Goal status: INITIAL  3.  Patient educated on how to manage rectal prolapse.  Baseline:  Goal status: INITIAL  4.  Pelvic floor strength >/= 3/5 to fully push the stool out of the rectum.  Baseline:  Goal status: INITIAL   PLAN: PT FREQUENCY: 1x/week  PT DURATION: 12 weeks  PLANNED INTERVENTIONS: Therapeutic exercises, Therapeutic activity, Neuromuscular re-education, Patient/Family education, Self Care, Joint mobilization, Dry Needling, Biofeedback, and Manual therapy  PLAN FOR NEXT SESSION: manual work to the anus, diaphragmatic breathing   Earlie Counts, PT 04/23/22 11:49 AM

## 2022-04-23 ENCOUNTER — Encounter: Payer: Self-pay | Admitting: Physical Therapy

## 2022-04-23 ENCOUNTER — Other Ambulatory Visit: Payer: Self-pay

## 2022-04-23 ENCOUNTER — Ambulatory Visit
Admission: RE | Admit: 2022-04-23 | Discharge: 2022-04-23 | Disposition: A | Discharge status: Home / Self Care | Payer: 59 | Source: Ambulatory Visit | Attending: Family Medicine | Admitting: Family Medicine

## 2022-04-23 ENCOUNTER — Ambulatory Visit: Payer: 59 | Attending: Internal Medicine | Admitting: Physical Therapy

## 2022-04-23 DIAGNOSIS — Z8249 Family history of ischemic heart disease and other diseases of the circulatory system: Secondary | ICD-10-CM | POA: Diagnosis not present

## 2022-04-23 DIAGNOSIS — K623 Rectal prolapse: Secondary | ICD-10-CM | POA: Insufficient documentation

## 2022-04-23 DIAGNOSIS — M6281 Muscle weakness (generalized): Secondary | ICD-10-CM | POA: Insufficient documentation

## 2022-04-23 DIAGNOSIS — R278 Other lack of coordination: Secondary | ICD-10-CM | POA: Insufficient documentation

## 2022-04-23 DIAGNOSIS — E785 Hyperlipidemia, unspecified: Secondary | ICD-10-CM

## 2022-04-23 NOTE — Patient Instructions (Addendum)
Moisturizers They are used in the vagina to hydrate the mucous membrane that make up the vaginal canal. Designed to keep a more normal acid balance (ph) Once placed in the vagina, it will last between two to three days.  Use 2-3 times per week at bedtime  Ingredients to avoid is glycerin and fragrance, can increase chance of infection Should not be used just before sex due to causing irritation Most are gels administered either in a tampon-shaped applicator or as a vaginal suppository. They are non-hormonal.   Types of Moisturizers(internal use)  Vitamin E vaginal suppositories- Whole foods, Amazon Moist Again Coconut oil- can break down condoms Julva- (Do no use if on Tamoxifen) amazon Yes moisturizer- amazon NeuEve Silk , NeuEve Silver for menopausal or over 65 (if have severe vaginal atrophy or cancer treatments use NeuEve Silk for  1 month than move to [employer])- [employer], [place].com Olive and Bee intimate cream- [email or web address].com.au Mae vaginal moisturizer- Amazon Aloe Good Clean Love Hyaluronic acid Hyalofemme replens   Creams to use externally on the Vulva area V-magic cream - amazon Julva-amazon Vital "V [place] salve ( help moisturize and help with thinning vulvar area, does have [place] by Irwin Brakeman labial moisturizer (Amazon,  Coconut or olive oil aloe Good Clean Love Enchanted Rose by intimate rose  Things to avoid in the vaginal area Do not use things to irritate the vulvar area No lotions just specialized creams for the vulva area- Neogyn, V-magic, No soaps; can use Aveeno or Calendula cleanser if needed. Must be gentle No deodorants No douches Good to sleep without underwear to let the vaginal area to air out No scrubbing: spread the lips to let warm water rinse over labias and pat dry  [facility] [address] [place], [place]  [address] Phone # [phone] Fax [phone]

## 2022-04-26 DIAGNOSIS — E782 Mixed hyperlipidemia: Secondary | ICD-10-CM | POA: Diagnosis not present

## 2022-04-26 DIAGNOSIS — Z9189 Other specified personal risk factors, not elsewhere classified: Secondary | ICD-10-CM | POA: Diagnosis not present

## 2022-04-26 DIAGNOSIS — F324 Major depressive disorder, single episode, in partial remission: Secondary | ICD-10-CM | POA: Diagnosis not present

## 2022-04-26 DIAGNOSIS — F5081 Binge eating disorder: Secondary | ICD-10-CM | POA: Diagnosis not present

## 2022-04-26 DIAGNOSIS — Z6829 Body mass index (BMI) 29.0-29.9, adult: Secondary | ICD-10-CM | POA: Diagnosis not present

## 2022-04-26 DIAGNOSIS — G894 Chronic pain syndrome: Secondary | ICD-10-CM | POA: Diagnosis not present

## 2022-04-26 DIAGNOSIS — E663 Overweight: Secondary | ICD-10-CM | POA: Diagnosis not present

## 2022-04-26 DIAGNOSIS — R632 Polyphagia: Secondary | ICD-10-CM | POA: Diagnosis not present

## 2022-04-27 ENCOUNTER — Ambulatory Visit (HOSPITAL_BASED_OUTPATIENT_CLINIC_OR_DEPARTMENT_OTHER): Payer: 59 | Admitting: Physical Therapy

## 2022-04-27 ENCOUNTER — Encounter (HOSPITAL_BASED_OUTPATIENT_CLINIC_OR_DEPARTMENT_OTHER): Payer: Self-pay | Admitting: Physical Therapy

## 2022-04-27 DIAGNOSIS — R262 Difficulty in walking, not elsewhere classified: Secondary | ICD-10-CM

## 2022-04-27 DIAGNOSIS — M25552 Pain in left hip: Secondary | ICD-10-CM | POA: Diagnosis not present

## 2022-04-27 DIAGNOSIS — M6281 Muscle weakness (generalized): Secondary | ICD-10-CM | POA: Diagnosis not present

## 2022-04-27 NOTE — Therapy (Signed)
OUTPATIENT PHYSICAL THERAPY LOWER EXTREMITY Treatment  PHYSICAL THERAPY DISCHARGE SUMMARY  Visits from Start of Care: 16  Plan: Patient agrees to discharge.  Patient goals were met. Patient is being discharged due to meeting the stated rehab goals.         Patient Name: Tammy [name] MRN: [id number] DOB:1960/11/21, 61 y.o., female Today's Date: 04/27/2022   PT End of Session - 04/27/22 0857     Visit Number 16    Number of Visits 16    Date for PT Re-Evaluation 05/03/22    Authorization Type [employer]    PT Start Time 0802    PT Stop Time 0843    PT Time Calculation (min) 41 min    Activity Tolerance Patient tolerated treatment well    Behavior During Therapy [facility] for tasks assessed/performed                           Past Medical History:  Diagnosis Date   Arthritis    Chronic fatigue syndrome    Diverticulosis    External hemorrhoids    Hypertension    Internal hemorrhoids    Neuropathy    Spinal stenosis    Past Surgical History:  Procedure Laterality Date   CARDIAC CATHETERIZATION  09/18/2012   LEFT HEART CATHETERIZATION WITH CORONARY ANGIOGRAM N/A 09/18/2012   Procedure: LEFT HEART CATHETERIZATION WITH CORONARY ANGIOGRAM;  Surgeon: Sherren Mocha, MD;  Location: [facility] CATH LAB;  Service: Cardiovascular;  Laterality: N/A;   TONSILLECTOMY  1965   Patient Active Problem List   Diagnosis Date Noted   Hip flexor tendon tightness, left 04/02/2022   Greater trochanteric bursitis of left hip 01/11/2022   Anxiety 07/24/2020   Nonallopathic lesion of cervical region 04/03/2020   Nonallopathic lesion of thoracic region 04/03/2020   Nonallopathic lesion of lumbar region 04/03/2020   Cervical stenosis of spine 03/31/2020   Attention deficit disorder (ADD) in adult 03/31/2020   Low bone density 03/17/2020   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[place]) 02/15/2020   GERD (gastroesophageal reflux disease) 02/15/2020   Allergic rhinitis  03/09/2016   Essential hypertension 02/18/2014   Chronic pain syndrome 02/22/2013   Chronic insomnia 02/22/2013   Paresthesias 10/25/2012   Hyperlipidemia 09/18/2012    PCP: [name], MD  REFERRING PROVIDER: [name], DO  REFERRING DIAG: [phone] (ICD-10-CM) - Left hip pain  THERAPY DIAG:  Pain in left hip  Difficulty walking  ONSET DATE: Fall 2022  SUBJECTIVE:   SUBJECTIVE STATEMENT: Pt states she was able to walk about 45 mins 2x days in a row but on the 3rd day, all of the original pain came back as she started the walk. She states this was a "major setback."  She has had more hip pain at night bc of the this. The hot tub at the pool seemed to help. She was able to walk about 30 mins following/after it was resting. However, it still hurt.   F/u with Dr. Tamala Julian in 2-3 weeks.    PERTINENT HISTORY: Depression, spinal stenosis, HTN, chronic pain  PAIN:  Are you having pain? Yes: NPRS scale: 1/10 Pain location: lower back, ant thigh, posterior hip Pain description: "tight" Aggravating factors: walking, hip ER, stairs, crossing the legs  Relieving factors: swimming, hot tub,   PRECAUTIONS: None  WEIGHT BEARING RESTRICTIONS No  FALLS:  Has patient fallen in last 6 months? No  LIVING ENVIRONMENT: Lives with: lives with their spouse  Lives in: House/apartment   OCCUPATION: [employer], standing desk   PLOF: Independent  PATIENT GOALS : Returning to normal walking   OBJECTIVE: objective measures taken at [facility] unless otherwise specified  DIAGNOSTIC FINDINGS:   Lumbar Xray: IMPRESSION: Mild-to-moderate lower lumbar spondylosis without evidence of interval progression from prior. No acute findings.  Hip Xray FINDINGS: Mildly decreased bone mineralization. Minimal superior left femoroacetabular joint space narrowing. Mild superolateral left acetabular degenerative osteophytosis. No significant degenerative change within the  right hip, pubic symphysis, or sacroiliac joints. No acute fracture or dislocation.   IMPRESSION: Mild left femoroacetabular osteoarthritis.    PATIENT SURVEYS:  LEFS Lower Extremity Functional Score: 46 / 80 = 57.5 %  Lower Extremity Functional Score: 64 / 80 = 80.0 %    PALPATION:  Mild L hip joint tightness at end range flexion; springy end feel vs firm  LE ROM:  Passive ROM Right 02/02/2022 Left 02/02/2022 L 6/27 L 8/8  Hip flexion WFL 110 120 130  Hip extension WFL 0 5 5  Hip abduction WFL WNL WNL WNL  Hip adduction WFL WNL WNL WNL  Hip internal rotation WFL WNL p! WNL WNL  Hip external rotation WFL WNL p! WNL WNL   (Blank rows = not tested)  LE MMT:  MMT Right 02/02/2022 Left 02/02/2022 L 6/27 8/8  Hip flexion 5/5 4+/5 p!  4+/5 5/5  Hip extension 5/5 4+/5 5/5 5/5  Hip abduction 5/5 5/5 5/5 5/5  Hip adduction 5/5 5/5 5/5 5/5  Knee extension 5/5 5/5 5/5 5/5   (Blank rows = not tested)    TODAY'S TREATMENT:  8/7 L hip Mulligan belt mob grade IV lateral and inf, IR/ER mob with movement grade IV; LAD grade IV S/L ext mob grade IV  Deep squat hold at bar in pain free range 5s 10x Discussion of HEP and transition to personal training  Activity modification with exercise, avoiding agg factors, pre-walking warm up, daily mobility stretching, rest/recovery times  8/1 L hip Mulligan belt mob grade IV lateral and inf, IR/ER mob with movement grade IV; LAD grade IV S/L ext mob grade IV  Self stair distraction stretch 5lb ankle weight with leg swinging  Self hip quadruped mobilization with black band anchored to table- flexion, flexion with SB, flexion with IR and ER Deep squat hold at bar in pain free range  Activity modification with yardwork, pre-walking warm up, daily mobility stretching, rest/recovery times  7/14 L hip Mulligan belt mob grade IV lateral and inf, IR/ER mob with movement grade IV; LAD grade IV S/L ext mob grade IV  Gray Leg press 25lbs  3x8 Seated knee extension 35lbs 3x8 Single leg bridge 2x10 RTB at ankles monster walk fwd and retro 27fx2 Step up 8" box 2x10 fwd and lateral 15lb KB  Fwd lunge at silver bar 2x10 bilat (no UE support)   7/11 L hip Mulligan belt mob grade IV lateral and inf, IR/ER mob with movement grade IV; LAD grade IV S/L ext mob grade IV  Single leg bridge 2x10 RDL 10lbs 3x10 RTB at ankles monster walk fwd and retro 372f2 Step up 8" box 2x10 fwd 15lb KB  Fwd lunge at silver bar 2x10 bilat  Goblet squat to low table height 3x10 15lb KB   7/6  MANUAL: roller Lt lateral thigh, STM Lt pectineus, hip mobs- LAD and lateral in hooklying  Hesch self correction for Lt post innom rotation- advised of leg off of table as well as Rt single  knee to chest  Hooklying core/pelvic floor contraction with breathing Bridges Clams  Contractions with exhale and lift   6/29   L hip Mulligan belt mob grade IV lateral and inf, IR/ER mob with movement grade IV; LAD grade IV S/L ext mob grade IV  Self hip extension mob with black TB pulling ant 5s hold 15x Prone hip flexor stretch with towel prop under knee 30s 3x  RTB at ankles monster walk fwd and retro 9fx2  Step up 8" box 2x10 fwd 15lb KB  8" lateral step  2x10 Shuttle leg press staggered stance 125 lbs 3x10 Lateral shuttle leg press 2x10 50lbs  Fwd lunge at silver bar 2x10 bilat (longer stride length) Goblet squat to low table height 3x10 15lb KB   6/27     L hip Mulligan belt mob grade IV lateral and inf, IR/ER mob with movement grade IV; LAD grade IV S/L ext mob grade IV     RTB at ankles monster walk fwd and retro 353f2 Step up 8" box 2x10 lateral Supine Hip flexor march YTB at toe box 2x10 90/90 position Shuttle leg press staggered stance 118lbs 3x10 Lateral shuttle leg press 2x10 50lbs  Fwd lunge at silver bar 2x10 bilat  6/23  L hip Mulligan belt mob grade IV lateral and inf, IR/ER mob with movement grade IV; LAD grade IV S/L ext  mob grade IV  Thomas stretch edge of bed with strap 30s 3x Sidestepping RTB 3554f laps RTB at ankles monster walk fwd and retro [id number] 4 supine stretch (hands through window) 30s 3x Step up 8" box 2x10 fwd and lateral white leg press staggered stance 3x10 75lbs    PATIENT EDUCATION:  Education details:  programming, acceptable levels of pain, exercise progression, envelope of function, HEP, POC   Person educated: Patient Education method: Explanation, Demonstration, Tactile cues, Verbal cues, and Handouts Education comprehension: verbalized understanding, returned demonstration, verbal cues required, and tactile cues required   HOME EXERCISE PROGRAM: Access Code: [email or web address]: [email or web address] Date: 02/02/2022   ASSESSMENT:  CLINICAL IMPRESSION: Pt demonstrated continued improvement in hip ROM and strength and has return to functional exercise. However, frequency and duration of waking need to be modified at this time in order to reduce exacerbation of pain. Pt has reach functional plateau currently with therapy and is slated to transition to personal training here at [facility] with good understanding of self pian management techniques and gives verbal understanding to edu regarding aggravating factors. Precautions for personal training provided to pt and spoke directly with personal trainer today regarding pt case after pt consent given. Pt advised to consider asking MD about potential injection to aid in pain management for continuation of exercise. D/C this episode of care at this time. Pt would benefit from continued skilled therapy in order to reach goals and maximize functional L LE strength and ROM for full return to PLOF.    OBJECTIVE IMPAIRMENTS Abnormal gait, decreased activity tolerance, decreased mobility, difficulty walking, decreased ROM, decreased strength, hypomobility, increased fascial restrictions, increased muscle spasms, impaired  flexibility, impaired sensation, improper body mechanics, postural dysfunction, and pain.   ACTIVITY LIMITATIONS cleaning, community activity, occupation, yard work, shopping, and exercise .   PERSONAL FACTORS Age, Behavior pattern, Past/current experiences, Time since onset of injury/illness/exacerbation, and 1-2 comorbidities:    are also affecting patient's functional outcome.    REHAB POTENTIAL: Good  CLINICAL DECISION MAKING: Stable/uncomplicated  EVALUATION COMPLEXITY: Low   GOALS:  SHORT TERM GOALS: Target date: 03/16/2022  Pt will become independent with HEP in order to demonstrate synthesis of PT education.  Goal status: MET  2.  Pt will be able to demonstrate active hip extension during gait without pain in order to demonstrate functional improvement in LE function for self-care and house hold duties.   Goal status: MET  3.  Pt will report at least 2 pt reduction on NPRS scale for pain during walking in order to demonstrate functional improvement with household activity, self care, and ADL.  Goal status: MET    LONG TERM GOALS: Target date: 04/27/2022    Pt  will become independent with final HEP in order to demonstrate synthesis of PT education.  Goal status: MET  2.  Pt will be able to demonstrate/report ability to walk >40 mins without pain in order to demonstrate functional improvement and tolerance to exercise and community mobility.   Goal status: MET  3.  Pt will have an at least 18 pt improvement in LEFS measure in order to demonstrate MCID improvement in daily function.  Goal status: MET  4.  Pt will be able to demonstrate ability to perform reciprocal stair stepping without pain or discomfort in order to demonstrate functional improvement in LE function for daily mobility and house hold duties.   Goal status: MET   PLAN: PT FREQUENCY: 1-2x/week  PT DURATION: 12 weeks (likely DC by 8wks)  PLANNED INTERVENTIONS: Therapeutic exercises,  Therapeutic activity, Neuromuscular re-education, Balance training, Gait training, Patient/Family education, Joint manipulation, Joint mobilization, Stair training, Orthotic/Fit training, DME instructions, Aquatic Therapy, Dry Needling, Electrical stimulation, Spinal manipulation, Spinal mobilization, Cryotherapy, Moist heat, scar mobilization, Taping, Vasopneumatic device, Traction, Ultrasound, Ionotophoresis 82m/ml Dexamethasone, Manual therapy, and Re-evaluation   ADaleen BoPT, DPT 04/27/22 9:04 AM

## 2022-04-28 ENCOUNTER — Other Ambulatory Visit (HOSPITAL_COMMUNITY): Payer: Self-pay

## 2022-04-28 MED ORDER — WEGOVY 1 MG/0.5ML ~~LOC~~ SOAJ
SUBCUTANEOUS | 0 refills | Status: DC
  Filled 2022-05-12 (×2): qty 2, 28d supply, fill #0

## 2022-04-28 MED ORDER — WEGOVY 0.5 MG/0.5ML ~~LOC~~ SOAJ
SUBCUTANEOUS | 0 refills | Status: DC
  Filled 2022-05-26: qty 2, 28d supply, fill #0

## 2022-04-28 MED ORDER — ONDANSETRON 4 MG PO TBDP
ORAL_TABLET | ORAL | 0 refills | Status: DC
Start: 2022-04-27 — End: 2023-02-22
  Filled 2022-04-28: qty 20, 7d supply, fill #0

## 2022-04-28 MED ORDER — WEGOVY 1.7 MG/0.75ML ~~LOC~~ SOAJ: SUBCUTANEOUS | 0 refills | Status: DC

## 2022-04-28 MED ORDER — WEGOVY 0.25 MG/0.5ML ~~LOC~~ SOAJ
SUBCUTANEOUS | 0 refills | Status: DC
  Filled 2022-04-28: qty 2, 28d supply, fill #0

## 2022-04-29 DIAGNOSIS — F411 Generalized anxiety disorder: Secondary | ICD-10-CM | POA: Diagnosis not present

## 2022-04-29 DIAGNOSIS — F332 Major depressive disorder, recurrent severe without psychotic features: Secondary | ICD-10-CM | POA: Diagnosis not present

## 2022-04-30 ENCOUNTER — Ambulatory Visit: Payer: 59 | Admitting: Physical Therapy

## 2022-04-30 ENCOUNTER — Encounter: Payer: Self-pay | Admitting: Physical Therapy

## 2022-04-30 DIAGNOSIS — K623 Rectal prolapse: Secondary | ICD-10-CM | POA: Diagnosis not present

## 2022-04-30 DIAGNOSIS — R278 Other lack of coordination: Secondary | ICD-10-CM

## 2022-04-30 DIAGNOSIS — M6281 Muscle weakness (generalized): Secondary | ICD-10-CM

## 2022-04-30 NOTE — Therapy (Signed)
OUTPATIENT PHYSICAL THERAPY TREATMENT NOTE   Patient Name: Tammy [name] MRN: [id number] DOB:[birth year], 61 y.o., female Today's Date: 04/30/2022  PCP: [name], MD REFERRING PROVIDER: Jerene Bears, MD  END OF SESSION:   PT End of Session - 04/30/22 0849     Visit Number 2    Date for PT Re-Evaluation 07/16/22    Authorization Type [employer]    Authorization - Visit Number 17    Authorization - Number of Visits 27    PT Start Time 0800    PT Stop Time 0840    PT Time Calculation (min) 40 min    Activity Tolerance Patient tolerated treatment well    Behavior During Therapy [facility] for tasks assessed/performed             Past Medical History:  Diagnosis Date   Arthritis    Chronic fatigue syndrome    Diverticulosis    External hemorrhoids    Hypertension    Internal hemorrhoids    Neuropathy    Spinal stenosis    Past Surgical History:  Procedure Laterality Date   CARDIAC CATHETERIZATION  09/18/2012   LEFT HEART CATHETERIZATION WITH CORONARY ANGIOGRAM N/A 09/18/2012   Procedure: LEFT HEART CATHETERIZATION WITH CORONARY ANGIOGRAM;  Surgeon: Sherren Mocha, MD;  Location: [facility] CATH LAB;  Service: Cardiovascular;  Laterality: N/A;   TONSILLECTOMY  1965   Patient Active Problem List   Diagnosis Date Noted   Hip flexor tendon tightness, left 04/02/2022   Greater trochanteric bursitis of left hip 01/11/2022   Anxiety 07/24/2020   Nonallopathic lesion of cervical region 04/03/2020   Nonallopathic lesion of thoracic region 04/03/2020   Nonallopathic lesion of lumbar region 04/03/2020   Cervical stenosis of spine 03/31/2020   Attention deficit disorder (ADD) in adult 03/31/2020   Low bone density 03/17/2020   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[place]) 02/15/2020   GERD (gastroesophageal reflux disease) 02/15/2020   Allergic rhinitis 03/09/2016   Essential hypertension 02/18/2014   Chronic pain syndrome 02/22/2013   Chronic insomnia 02/22/2013    Paresthesias 10/25/2012   Hyperlipidemia 09/18/2012   REFERRING PROVIDER: Jerene Bears, MD   REFERRING DIAG: K62.3 (ICD-10-CM) - Rectal prolapse THERAPY DIAG:  Muscle weakness (generalized)   Other lack of coordination   Rationale for Evaluation and Treatment Rehabilitation   ONSET DATE: 04/23/2021   SUBJECTIVE:                                                                                                                                                                                            SUBJECTIVE STATEMENT: I have used the  vaginal moisturizer and had discomfort with insertion.     PAIN:  Are you having pain? No   PRECAUTIONS: None   WEIGHT BEARING RESTRICTIONS No   FALLS:  Has patient fallen in last 6 months? 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   PLOF: Independent   PATIENT GOALS  education on rectal prolapse management and strengthen PERTINENT HISTORY:  Chronic fatigue syndrome, Diverticulosis, External and internal hemorrhoids   BOWEL MOVEMENT Pain with bowel movement: No Type of bowel movement:Type (Bristol Stool Scale) Type 5 or 6, Frequency daily, Strain Yes, and Splinting no; feels like straining when the prolapse is out Fully empty rectum: Yes:   Leakage: No, mucous leakage Pads: No Fiber supplement: Yes: magnesium , uses glycerin suppositories every morning      PROLAPSE Rectal prolapse     OBJECTIVE:    DIAGNOSTIC FINDINGS:  none   PATIENT SURVEYS:  none   COGNITION:            Overall cognitive status: Within functional limits for tasks assessed                          SENSATION:            Light touch: Appears intact            Proprioception: Appears intact                  POSTURE: No Significant postural limitations               PELVIC ALIGNMENT:   LUMBARAROM/PROM   A/PROM A/PROM  eval  Flexion full  Extension Decreased by 25%  Right  lateral flexion Decreased by 25%  Left lateral flexion Decreased by 25%  Right rotation Decreased by 25%  Left rotation Decreased by 25%   (Blank rows = not tested)   LOWER EXTREMITY ROM:   Passive ROM Right eval Left eval  Hip flexion      Hip extension      Hip abduction      Hip adduction      Hip internal rotation      Hip external rotation      Knee flexion      Knee extension      Ankle dorsiflexion      Ankle plantarflexion      Ankle inversion      Ankle eversion       (Blank rows = not tested)   LOWER EXTREMITY MMT:   MMT Right eval Left eval  Hip flexion      Hip extension      Hip abduction      Hip adduction      Hip internal rotation      Hip external rotation      Knee flexion      Knee extension      Ankle dorsiflexion      Ankle plantarflexion      Ankle inversion      Ankle eversion         PALPATION:   General  Patient was not able to push the therapist finger out of the anal canal                  External Perineal Exam tightness in the anal sphincter, perineal body, had hemorrhoids were apparent  Internal Pelvic Floor thickness on the anterior rectum, thickness along the internal anal sphincter, decreased movement of the puborectalis, tenderness located on the levator ani   Patient confirms identification and approves PT to assess internal pelvic floor and treatment Yes   PELVIC MMT:   MMT eval 04/30/2022  Vaginal     Internal Anal Sphincter Ant. Is 1/5; post. 2/5 3/5 with hug of therapist finger  External Anal Sphincter Ant. Is 1/5; post. 2/5 3/5 with hug of therapist finger  Puborectalis 1/5 2/5  (Blank rows = not tested)         TONE: increased   PROLAPSE: Rectal prolapse   TODAY'S TREATMENT  04/30/2022 Manual: Internal pelvic floor techniques:No emotional/communication barriers or cognitive limitation. Patient is motivated to learn. Patient understands and agrees with treatment goals and plan. PT  explains patient will be examined in standing, sitting, and lying down to see how their muscles and joints work. When they are ready, they will be asked to remove their underwear so PT can examine their perineum. The patient is also given the option of providing their own chaperone as one is not provided in our facility. The patient also has the right and is explained the right to defer or refuse any part of the evaluation or treatment including the internal exam. With the patient's consent, PT will use one gloved finger to gently assess the muscles of the pelvic floor, seeing how well it contracts and relaxes and if there is muscle symmetry. After, the patient will get dressed and PT and patient will discuss exam findings and plan of care. PT and patient discuss plan of care, schedule, attendance policy and HEP activities.    Going through the anus working on the external and internal sphincter, along the puborectalis, along the obturator internist, iliococcygeus, and anococcygeal ligament Neuromuscular re-education: Pelvic floor contraction training:pelvic floor contraction with therapist finger in the anus to give tactile cues for a circular and lift contraction Down training: Diaphragmatic breathing to elongate the pelvic floor Therapeutic activities: Functional strengthening activities:educated patient on correct toileting techniique using a squatty potty, correct breath, and knees above hips.  Self-care: Reviewed vaginal moisturizers and how to use them.     EVAL Date: 04/23/2022 HEP established-see below      PATIENT EDUCATION:  04/30/2022 Education details: Access Code: [email or web address], toileting technique, sent you tube video for pelvic floor stretches with legs on the wall Person educated: Patient Education method: Explanation, Demonstration, Tactile cues, Verbal cues, and Handouts, you tube Education comprehension: verbalized understanding, returned demonstration, verbal cues required, tactile  cues required, and needs further education     HOME EXERCISE PROGRAM: 04/30/2022 Access Code: [email or web address] URL: [email or web address] Date: 04/30/2022 Prepared by: Earlie Counts  Exercises - Supine Diaphragmatic Breathing  - 1 x daily - 7 x weekly - 3 sets - 10 reps - Seated Diaphragmatic Breathing  - 1 x daily - 7 x weekly - 1 sets - 10 reps - Seated Pelvic Floor Contraction  - 3 x daily - 7 x weekly - 1 sets - 10 reps - 5 sec hold   ASSESSMENT:   CLINICAL IMPRESSION: Patient is a 60 y.o. female who was seen today for physical therapy treatment for rectal prolapse. Patient reports she noticed a prolapse 1 year ago. Pelvic floor strength increased to 3/5 after manual work . She was able to demonstrate diaphragmatic breathing for toileting and push the therapist finger out of the anus after several tries. Patient was not able to push  the therapist finger out of the anal canal. Patient will benefit from skilled therapy to improve health of tissue, coordination of the muscles and strength.      OBJECTIVE IMPAIRMENTS decreased coordination, decreased endurance, decreased strength, increased fascial restrictions, and impaired tone.    ACTIVITY LIMITATIONS toileting   PARTICIPATION LIMITATIONS:  toileting   PERSONAL FACTORS Fitness are also affecting patient's functional outcome.    REHAB POTENTIAL: Excellent   CLINICAL DECISION MAKING: Stable/uncomplicated   EVALUATION COMPLEXITY: Low     GOALS: Goals reviewed with patient? Yes   SHORT TERM GOALS: Target date: 05/21/2022   Patient independent with massage of the external anus to relax the tissue.  Baseline: Goal status: INITIAL   2.  Patient able to perform diaphragmatic breathing to relax the anal tissue.  Baseline:  Goal status: INITIAL   3.  Patient reduced her straining with a bowel movement due to contracting the pelvic floor afterwards.  Baseline:  Goal status: INITIAL     LONG TERM GOALS: Target date:  07/16/2022    Patient is independent with advanced HEP for pelvic floor.  Baseline:  Goal status: INITIAL   2.  Patient is able to have a bowel movement without straining due to improved coordination of the pelvic floor muscles.  Baseline:  Goal status: INITIAL   3.  Patient educated on how to manage rectal prolapse.  Baseline:  Goal status: INITIAL   4.  Pelvic floor strength >/= 3/5 to fully push the stool out of the rectum.  Baseline:  Goal status: INITIAL     PLAN: PT FREQUENCY: 1x/week   PT DURATION: 12 weeks   PLANNED INTERVENTIONS: Therapeutic exercises, Therapeutic activity, Neuromuscular re-education, Patient/Family education, Self Care, Joint mobilization, Dry Needling, Biofeedback, and Manual therapy   PLAN FOR NEXT SESSION: manual work to the anus, educated on massage of the external anus prior to toileting; core strength   Earlie Counts, PT 04/30/22 8:51 AM

## 2022-04-30 NOTE — Patient Instructions (Signed)

## 2022-05-03 ENCOUNTER — Ambulatory Visit: Payer: 59 | Admitting: Psychiatry

## 2022-05-06 ENCOUNTER — Other Ambulatory Visit (HOSPITAL_COMMUNITY): Payer: Self-pay

## 2022-05-06 ENCOUNTER — Other Ambulatory Visit: Payer: Self-pay | Admitting: Psychiatry

## 2022-05-06 DIAGNOSIS — F988 Other specified behavioral and emotional disorders with onset usually occurring in childhood and adolescence: Secondary | ICD-10-CM

## 2022-05-06 MED ORDER — LISDEXAMFETAMINE DIMESYLATE 20 MG PO CAPS
20.0000 mg | ORAL_CAPSULE | Freq: Every day | ORAL | 0 refills | Status: DC
  Filled 2022-05-06 – 2022-08-02 (×2): qty 90, 90d supply, fill #0

## 2022-05-07 ENCOUNTER — Ambulatory Visit: Payer: 59 | Admitting: Psychiatry

## 2022-05-10 ENCOUNTER — Other Ambulatory Visit (HOSPITAL_COMMUNITY): Payer: Self-pay

## 2022-05-10 DIAGNOSIS — R632 Polyphagia: Secondary | ICD-10-CM | POA: Diagnosis not present

## 2022-05-10 DIAGNOSIS — F324 Major depressive disorder, single episode, in partial remission: Secondary | ICD-10-CM | POA: Diagnosis not present

## 2022-05-10 DIAGNOSIS — F5081 Binge eating disorder: Secondary | ICD-10-CM | POA: Diagnosis not present

## 2022-05-10 DIAGNOSIS — E663 Overweight: Secondary | ICD-10-CM | POA: Diagnosis not present

## 2022-05-12 ENCOUNTER — Other Ambulatory Visit (HOSPITAL_COMMUNITY): Payer: Self-pay

## 2022-05-13 DIAGNOSIS — F411 Generalized anxiety disorder: Secondary | ICD-10-CM | POA: Diagnosis not present

## 2022-05-13 DIAGNOSIS — F332 Major depressive disorder, recurrent severe without psychotic features: Secondary | ICD-10-CM | POA: Diagnosis not present

## 2022-05-17 ENCOUNTER — Other Ambulatory Visit (HOSPITAL_COMMUNITY): Payer: Self-pay

## 2022-05-17 ENCOUNTER — Ambulatory Visit: Payer: 59 | Admitting: Psychiatry

## 2022-05-18 ENCOUNTER — Other Ambulatory Visit (HOSPITAL_COMMUNITY): Payer: Self-pay

## 2022-05-18 MED ORDER — AMOXICILLIN 500 MG PO CAPS
ORAL_CAPSULE | ORAL | 0 refills | Status: DC
  Filled 2022-05-18: qty 21, 7d supply, fill #0

## 2022-05-26 ENCOUNTER — Other Ambulatory Visit (HOSPITAL_COMMUNITY): Payer: Self-pay

## 2022-05-26 DIAGNOSIS — F5081 Binge eating disorder: Secondary | ICD-10-CM | POA: Diagnosis not present

## 2022-05-26 DIAGNOSIS — F324 Major depressive disorder, single episode, in partial remission: Secondary | ICD-10-CM | POA: Diagnosis not present

## 2022-05-26 DIAGNOSIS — E663 Overweight: Secondary | ICD-10-CM | POA: Diagnosis not present

## 2022-05-26 DIAGNOSIS — Z9189 Other specified personal risk factors, not elsewhere classified: Secondary | ICD-10-CM | POA: Diagnosis not present

## 2022-05-26 DIAGNOSIS — R632 Polyphagia: Secondary | ICD-10-CM | POA: Diagnosis not present

## 2022-05-26 DIAGNOSIS — Z6827 Body mass index (BMI) 27.0-27.9, adult: Secondary | ICD-10-CM | POA: Diagnosis not present

## 2022-05-27 DIAGNOSIS — F332 Major depressive disorder, recurrent severe without psychotic features: Secondary | ICD-10-CM | POA: Diagnosis not present

## 2022-05-27 DIAGNOSIS — F411 Generalized anxiety disorder: Secondary | ICD-10-CM | POA: Diagnosis not present

## 2022-05-28 NOTE — Progress Notes (Unsigned)
Tammy [name] [address] [place] Phone: [phone] Subjective:   IVilma [name], am serving as a scribe for Dr. Hulan Saas.  I'm seeing this patient by the request  of:  [name], MD  CC: Neck and back pain follow-up  NIO:[id number]  DENIM START is a 61 y.o. female coming in with complaint of back and neck pain. OMT on 04/02/2022.  Reviewing patient's chart has been doing physical therapy and has been discharged recently by 1 provider but continuing to see another provider.  Focusing more on the lower back.  Patient states   Medications patient has been prescribed: None  Taking:         Reviewed prior external information including notes and imaging from previsou exam, outside providers and external EMR if available.   As well as notes that were available from care everywhere and other healthcare systems.  Past medical history, social, surgical and family history all reviewed in electronic medical record.  No pertanent information unless stated regarding to the chief complaint.   Past Medical History:  Diagnosis Date   Arthritis    Chronic fatigue syndrome    Diverticulosis    External hemorrhoids    Hypertension    Internal hemorrhoids    Neuropathy    Spinal stenosis     Allergies  Allergen Reactions   Eggs Or Egg-Derived Products Other (See Comments)    Makes pt feel like burping up rotten eggs     Review of Systems:  No headache, visual changes, nausea, vomiting, diarrhea, constipation, dizziness, abdominal pain, skin rash, fevers, chills, night sweats, weight loss, swollen lymph nodes, body aches, joint swelling, chest pain, shortness of breath, mood changes. POSITIVE muscle aches  Objective  Blood pressure 120/76, pulse 86, height '5\' 1"'$  (1.549 m), weight 154 lb (69.9 kg), last menstrual period 11/05/2007, SpO2 97 %.   General: No apparent distress alert and oriented x3 mood and affect normal,  dressed appropriately.  HEENT: Pupils equal, extraocular movements intact  Respiratory: Patient's speak in full sentences and does not appear short of breath  Cardiovascular: No lower extremity edema, non tender, no erythema  Gait MSK:  Back does have some loss of lordosis noted.  Does have some tightness noted with worsening pain with flexion.  Positive straight leg test on the left side.  Patient is tender to palpation but diffusely in the greater trochanteric area.  No worsening pain with internal rotation of the hip.  Seems to have tightness of the FABER test but very minimal improvement from previous exam.      Assessment and Plan:  Hip flexor tendon tightness, left Continue tightness noted.  Discussed with patient the patient is having more radicular symptoms.  Infection has had some difficulty with her pelvic floor as well and is doing physical therapy.  Has had over the signs and symptoms that is consistent with either greater trochanteric bursitis but did not respond to the conservative therapy.  Questionable lumbar radiculopathy.  We will get MRI to further evaluate with patient failing over 6 weeks of physical therapy, medications including anti-inflammatories, depending on findings we will discuss the possibility of epidurals versus other medical management.      The above documentation has been reviewed and is accurate and complete [name], DO         Note: This dictation was prepared with Dragon dictation along with smaller phrase technology. Any transcriptional errors that result from  this process are unintentional.

## 2022-05-31 ENCOUNTER — Ambulatory Visit (INDEPENDENT_AMBULATORY_CARE_PROVIDER_SITE_OTHER): Payer: 59 | Admitting: Family Medicine

## 2022-05-31 VITALS — BP 120/76 | HR 86 | Ht 61.0 in | Wt 154.0 lb

## 2022-05-31 DIAGNOSIS — M24552 Contracture, left hip: Secondary | ICD-10-CM | POA: Diagnosis not present

## 2022-05-31 DIAGNOSIS — M9903 Segmental and somatic dysfunction of lumbar region: Secondary | ICD-10-CM

## 2022-05-31 NOTE — Patient Instructions (Signed)
[place] [phone] Call Today  When we receive your results we will contact you.

## 2022-05-31 NOTE — Assessment & Plan Note (Signed)
Continue tightness noted.  Discussed with patient the patient is having more radicular symptoms.  Infection has had some difficulty with her pelvic floor as well and is doing physical therapy.  Has had over the signs and symptoms that is consistent with either greater trochanteric bursitis but did not respond to the conservative therapy.  Questionable lumbar radiculopathy.  We will get MRI to further evaluate with patient failing over 6 weeks of physical therapy, medications including anti-inflammatories, depending on findings we will discuss the possibility of epidurals versus other medical management.

## 2022-06-02 ENCOUNTER — Other Ambulatory Visit: Payer: Self-pay | Admitting: Family Medicine

## 2022-06-02 ENCOUNTER — Encounter: Payer: Self-pay | Admitting: Family Medicine

## 2022-06-02 DIAGNOSIS — M9903 Segmental and somatic dysfunction of lumbar region: Secondary | ICD-10-CM

## 2022-06-02 DIAGNOSIS — M24552 Contracture, left hip: Secondary | ICD-10-CM

## 2022-06-04 ENCOUNTER — Other Ambulatory Visit (HOSPITAL_COMMUNITY): Payer: Self-pay

## 2022-06-07 ENCOUNTER — Ambulatory Visit: Payer: 59 | Attending: Internal Medicine | Admitting: Physical Therapy

## 2022-06-07 ENCOUNTER — Encounter: Payer: Self-pay | Admitting: Physical Therapy

## 2022-06-07 DIAGNOSIS — R278 Other lack of coordination: Secondary | ICD-10-CM | POA: Insufficient documentation

## 2022-06-07 DIAGNOSIS — M6281 Muscle weakness (generalized): Secondary | ICD-10-CM | POA: Insufficient documentation

## 2022-06-07 NOTE — Therapy (Signed)
OUTPATIENT PHYSICAL THERAPY TREATMENT NOTE   Patient Name: Tammy [name] MRN: [id number] DOB:Dec 12, 1960, 61 y.o., female Today's Date: 06/07/2022  PCP: [name], MD REFERRING PROVIDER: Jerene Bears, MD  END OF SESSION:   PT End of Session - 06/07/22 0804     Visit Number 3    Date for PT Re-Evaluation 07/16/22    Authorization Type [employer]    Authorization - Visit Number 18    Authorization - Number of Visits 27    PT Start Time 0800    PT Stop Time 0840    PT Time Calculation (min) 40 min    Activity Tolerance Patient tolerated treatment well    Behavior During Therapy [facility] for tasks assessed/performed             Past Medical History:  Diagnosis Date   Arthritis    Chronic fatigue syndrome    Diverticulosis    External hemorrhoids    Hypertension    Internal hemorrhoids    Neuropathy    Spinal stenosis    Past Surgical History:  Procedure Laterality Date   CARDIAC CATHETERIZATION  09/18/2012   LEFT HEART CATHETERIZATION WITH CORONARY ANGIOGRAM N/A 09/18/2012   Procedure: LEFT HEART CATHETERIZATION WITH CORONARY ANGIOGRAM;  Surgeon: Sherren Mocha, MD;  Location: [facility] CATH LAB;  Service: Cardiovascular;  Laterality: N/A;   TONSILLECTOMY  1965   Patient Active Problem List   Diagnosis Date Noted   Hip flexor tendon tightness, left 04/02/2022   Greater trochanteric bursitis of left hip 01/11/2022   Anxiety 07/24/2020   Nonallopathic lesion of cervical region 04/03/2020   Nonallopathic lesion of thoracic region 04/03/2020   Nonallopathic lesion of lumbar region 04/03/2020   Cervical stenosis of spine 03/31/2020   Attention deficit disorder (ADD) in adult 03/31/2020   Low bone density 03/17/2020   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[place]) 02/15/2020   GERD (gastroesophageal reflux disease) 02/15/2020   Allergic rhinitis 03/09/2016   Essential hypertension 02/18/2014   Chronic pain syndrome 02/22/2013   Chronic insomnia 02/22/2013    Paresthesias 10/25/2012   Hyperlipidemia 09/18/2012   REFERRING DIAG: K62.3 (ICD-10-CM) - Rectal prolapse THERAPY DIAG:  Muscle weakness (generalized)   Other lack of coordination   Rationale for Evaluation and Treatment Rehabilitation   ONSET DATE: 04/23/2021   SUBJECTIVE:                                                                                                                                                                                            SUBJECTIVE STATEMENT: There are times I feel the pressure to have a bowel movement  but nothing is there so I may strain. I am still having mucous leakage.      PAIN:  Are you having pain? No   PRECAUTIONS: None   WEIGHT BEARING RESTRICTIONS No   FALLS:  Has patient fallen in last 6 months? 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   PLOF: Independent   PATIENT GOALS  education on rectal prolapse management and strengthen PERTINENT HISTORY:  Chronic fatigue syndrome, Diverticulosis, External and internal hemorrhoids   BOWEL MOVEMENT Pain with bowel movement: No Type of bowel movement:Type (Bristol Stool Scale) Type 5 or 6, Frequency daily, Strain Yes, and Splinting no; feels like straining when the prolapse is out Fully empty rectum: Yes:   Leakage: No, mucous leakage Pads: No Fiber supplement: Yes: magnesium , uses glycerin suppositories every morning      PROLAPSE Rectal prolapse     OBJECTIVE:    DIAGNOSTIC FINDINGS:  none   PATIENT SURVEYS:  none   COGNITION:            Overall cognitive status: Within functional limits for tasks assessed                          SENSATION:            Light touch: Appears intact            Proprioception: Appears intact                  POSTURE: No Significant postural limitations               PELVIC ALIGNMENT:   LUMBARAROM/PROM   A/PROM A/PROM  eval  Flexion full  Extension  Decreased by 25%  Right lateral flexion Decreased by 25%  Left lateral flexion Decreased by 25%  Right rotation Decreased by 25%  Left rotation Decreased by 25%   (Blank rows = not tested)    PALPATION:   General  Patient was not able to push the therapist finger out of the anal canal                  External Perineal Exam tightness in the anal sphincter, perineal body, had hemorrhoids were apparent                             Internal Pelvic Floor thickness on the anterior rectum, thickness along the internal anal sphincter, decreased movement of the puborectalis, tenderness located on the levator ani   Patient confirms identification and approves PT to assess internal pelvic floor and treatment Yes   PELVIC MMT:   MMT eval 04/30/2022 06/07/2022  Vaginal       Internal Anal Sphincter Ant. Is 1/5; post. 2/5 3/5 with hug of therapist finger 3/5  External Anal Sphincter Ant. Is 1/5; post. 2/5 3/5 with hug of therapist finger 3/5  Puborectalis 1/5 2/5 3/5  (Blank rows = not tested)         TONE: increased   PROLAPSE: Rectal prolapse   TODAY'S TREATMENT  06/07/2022 Manual: Soft tissue mobilization:Manual work around the anus externally to open up the muscles for pushing stool out.  Internal pelvic floor techniques:No emotional/communication barriers or cognitive limitation. Patient is motivated to learn. Patient understands and agrees with treatment goals and plan. PT explains patient will be examined in standing,  sitting, and lying down to see how their muscles and joints work. When they are ready, they will be asked to remove their underwear so PT can examine their perineum. The patient is also given the option of providing their own chaperone as one is not provided in our facility. The patient also has the right and is explained the right to defer or refuse any part of the evaluation or treatment including the internal exam. With the patient's consent, PT will use one gloved finger  to gently assess the muscles of the pelvic floor, seeing how well it contracts and relaxes and if there is muscle symmetry. After, the patient will get dressed and PT and patient will discuss exam findings and plan of care. PT and patient discuss plan of care, schedule, attendance policy and HEP activities.  Going through the anus working on the sphincters and releasing from internal and external, working around the levator ani and anococcygeal ligament Neuromuscular re-education: Pelvic floor contraction training:contraction of the anus holding 5 sec 10x; diaphragmatic breathing to open the anus and push the stool with tactile and verbal cues to increase the pressure.  Self-care: Educated patient on how to reduce urge to have a bowel movement by laying on her back with legs elevated and contracting the pelvic floor 5 times to reduce the urge Discussed with patient reduce her magnesium fy 1 tablet to firm up her stool    04/30/2022 Manual: Internal pelvic floor techniques:No emotional/communication barriers or cognitive limitation. Patient is motivated to learn. Patient understands and agrees with treatment goals and plan. PT explains patient will be examined in standing, sitting, and lying down to see how their muscles and joints work. When they are ready, they will be asked to remove their underwear so PT can examine their perineum. The patient is also given the option of providing their own chaperone as one is not provided in our facility. The patient also has the right and is explained the right to defer or refuse any part of the evaluation or treatment including the internal exam. With the patient's consent, PT will use one gloved finger to gently assess the muscles of the pelvic floor, seeing how well it contracts and relaxes and if there is muscle symmetry. After, the patient will get dressed and PT and patient will discuss exam findings and plan of care. PT and patient discuss plan of care,  schedule, attendance policy and HEP activities.                          Going through the anus working on the external and internal sphincter, along the puborectalis, along the obturator internist, iliococcygeus, and anococcygeal ligament Neuromuscular re-education: Pelvic floor contraction training:pelvic floor contraction with therapist finger in the anus to give tactile cues for a circular and lift contraction Down training: Diaphragmatic breathing to elongate the pelvic floor Therapeutic activities: Functional strengthening activities:educated patient on correct toileting techniique using a squatty potty, correct breath, and knees above hips.  Self-care: Reviewed vaginal moisturizers and how to use them.     EVAL Date: 04/23/2022 HEP established-see below      PATIENT EDUCATION:  06/07/2022 Education details: Access Code: [email or web address], reducing her magnesium to firm up her stool, ways to massage the anus Person educated: Patient Education method: Explanation, Demonstration, Tactile cues, Verbal cues, and Handouts, you tube Education comprehension: verbalized understanding, returned demonstration, verbal cues required, tactile cues required, and needs further education  HOME EXERCISE PROGRAM: 04/30/2022 Access Code: [email or web address] URL: [email or web address] Date: 04/30/2022 Prepared by: Earlie Counts   Exercises - Supine Diaphragmatic Breathing  - 1 x daily - 7 x weekly - 3 sets - 10 reps - Seated Diaphragmatic Breathing  - 1 x daily - 7 x weekly - 1 sets - 10 reps - Seated Pelvic Floor Contraction  - 3 x daily - 7 x weekly - 1 sets - 10 reps - 5 sec hold   ASSESSMENT:   CLINICAL IMPRESSION: Patient is a 61 y.o. female who was seen today for physical therapy treatment for rectal prolapse. Anal strength is 3/5 with hug of therapist finger and circular. Patient has some areas around the anus that felt like a ball and could be an internal hemorrhoid. She has had increased  pressure on the anus. Patient was  able to push the therapist finger out of the anal canal with increased force by 50% and she was able to feel herself doing it.  Patient will benefit from skilled therapy to improve health of tissue, coordination of the muscles and strength.      OBJECTIVE IMPAIRMENTS decreased coordination, decreased endurance, decreased strength, increased fascial restrictions, and impaired tone.    ACTIVITY LIMITATIONS toileting   PARTICIPATION LIMITATIONS:  toileting   PERSONAL FACTORS Fitness are also affecting patient's functional outcome.    REHAB POTENTIAL: Excellent   CLINICAL DECISION MAKING: Stable/uncomplicated   EVALUATION COMPLEXITY: Low     GOALS: Goals reviewed with patient? Yes   SHORT TERM GOALS: Target date: 05/21/2022   Patient independent with massage of the external anus to relax the tissue.  Baseline: Goal status: Met 06/07/2022   2.  Patient able to perform diaphragmatic breathing to relax the anal tissue.  Baseline:  Goal status: Met 06/07/2022   3.  Patient reduced her straining with a bowel movement due to contracting the pelvic floor afterwards.  Baseline:  Goal status: INITIAL     LONG TERM GOALS: Target date: 07/16/2022    Patient is independent with advanced HEP for pelvic floor.  Baseline:  Goal status: INITIAL   2.  Patient is able to have a bowel movement without straining due to improved coordination of the pelvic floor muscles.  Baseline:  Goal status: INITIAL   3.  Patient educated on how to manage rectal prolapse.  Baseline:  Goal status: INITIAL   4.  Pelvic floor strength >/= 3/5 to fully push the stool out of the rectum.  Baseline:  Goal status: INITIAL     PLAN: PT FREQUENCY: 1x/week   PT DURATION: 12 weeks   PLANNED INTERVENTIONS: Therapeutic exercises, Therapeutic activity, Neuromuscular re-education, Patient/Family education, Self Care, Joint mobilization, Dry Needling, Biofeedback, and Manual  therapy   PLAN FOR NEXT SESSION: manual work to the anus,  core strength   Earlie Counts, PT 06/07/22 8:43 AM

## 2022-06-09 DIAGNOSIS — F5081 Binge eating disorder: Secondary | ICD-10-CM | POA: Diagnosis not present

## 2022-06-09 DIAGNOSIS — Z6827 Body mass index (BMI) 27.0-27.9, adult: Secondary | ICD-10-CM | POA: Diagnosis not present

## 2022-06-09 DIAGNOSIS — E663 Overweight: Secondary | ICD-10-CM | POA: Diagnosis not present

## 2022-06-09 DIAGNOSIS — F324 Major depressive disorder, single episode, in partial remission: Secondary | ICD-10-CM | POA: Diagnosis not present

## 2022-06-10 DIAGNOSIS — F411 Generalized anxiety disorder: Secondary | ICD-10-CM | POA: Diagnosis not present

## 2022-06-10 DIAGNOSIS — F332 Major depressive disorder, recurrent severe without psychotic features: Secondary | ICD-10-CM | POA: Diagnosis not present

## 2022-06-14 ENCOUNTER — Ambulatory Visit: Payer: 59 | Admitting: Physical Therapy

## 2022-06-14 ENCOUNTER — Encounter: Payer: Self-pay | Admitting: *Deleted

## 2022-06-14 ENCOUNTER — Encounter: Payer: Self-pay | Admitting: Physical Therapy

## 2022-06-14 DIAGNOSIS — M6281 Muscle weakness (generalized): Secondary | ICD-10-CM | POA: Diagnosis not present

## 2022-06-14 DIAGNOSIS — R278 Other lack of coordination: Secondary | ICD-10-CM | POA: Diagnosis not present

## 2022-06-14 NOTE — Therapy (Signed)
OUTPATIENT PHYSICAL THERAPY TREATMENT NOTE   Patient Name: Tammy [name] MRN: [id number] DOB:02-04-1961, 61 y.o., female Today's Date: 06/14/2022  PCP: [name], MD REFERRING PROVIDER: Jerene Bears, MD  END OF SESSION:   PT End of Session - 06/14/22 0847     Visit Number 4    Date for PT Re-Evaluation 07/16/22    Authorization Type [employer]    Authorization - Visit Number 19    Authorization - Number of Visits 27    PT Start Time 0800    PT Stop Time 3888    PT Time Calculation (min) 38 min    Activity Tolerance Patient tolerated treatment well    Behavior During Therapy [facility] for tasks assessed/performed             Past Medical History:  Diagnosis Date   Arthritis    Chronic fatigue syndrome    Diverticulosis    External hemorrhoids    Hypertension    Internal hemorrhoids    Neuropathy    Spinal stenosis    Past Surgical History:  Procedure Laterality Date   CARDIAC CATHETERIZATION  09/18/2012   LEFT HEART CATHETERIZATION WITH CORONARY ANGIOGRAM N/A 09/18/2012   Procedure: LEFT HEART CATHETERIZATION WITH CORONARY ANGIOGRAM;  Surgeon: Sherren Mocha, MD;  Location: [facility] CATH LAB;  Service: Cardiovascular;  Laterality: N/A;   TONSILLECTOMY  1965   Patient Active Problem List   Diagnosis Date Noted   Hip flexor tendon tightness, left 04/02/2022   Greater trochanteric bursitis of left hip 01/11/2022   Anxiety 07/24/2020   Nonallopathic lesion of cervical region 04/03/2020   Nonallopathic lesion of thoracic region 04/03/2020   Nonallopathic lesion of lumbar region 04/03/2020   Cervical stenosis of spine 03/31/2020   Attention deficit disorder (ADD) in adult 03/31/2020   Low bone density 03/17/2020   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[place]) 02/15/2020   GERD (gastroesophageal reflux disease) 02/15/2020   Allergic rhinitis 03/09/2016   Essential hypertension 02/18/2014   Chronic pain syndrome 02/22/2013   Chronic insomnia 02/22/2013    Paresthesias 10/25/2012   Hyperlipidemia 09/18/2012   REFERRING DIAG: K62.3 (ICD-10-CM) - Rectal prolapse THERAPY DIAG:  Muscle weakness (generalized)   Other lack of coordination   Rationale for Evaluation and Treatment Rehabilitation   ONSET DATE: 04/23/2021   SUBJECTIVE:                                                                                                                                                                                            SUBJECTIVE STATEMENT: Iam able to reduce the prolapse. Reducing the amonunt of magnesium has  helped the form of the stool. Mucous leakage does not happen during the day just when she strains.    PAIN:  Are you having pain? No   PRECAUTIONS: None   WEIGHT BEARING RESTRICTIONS No   FALLS:  Has patient fallen in last 6 months? 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   PLOF: Independent   PATIENT GOALS  education on rectal prolapse management and strengthen PERTINENT HISTORY:  Chronic fatigue syndrome, Diverticulosis, External and internal hemorrhoids   BOWEL MOVEMENT Pain with bowel movement: No Type of bowel movement:Type (Bristol Stool Scale) Type 5 or 6, Frequency daily, Strain Yes, and Splinting no; feels like straining when the prolapse is out Fully empty rectum: Yes:   Leakage: No, mucous leakage Pads: No Fiber supplement: Yes: magnesium , uses glycerin suppositories every morning      PROLAPSE Rectal prolapse     OBJECTIVE:    DIAGNOSTIC FINDINGS:  none   PATIENT SURVEYS:  none   COGNITION:            Overall cognitive status: Within functional limits for tasks assessed                          SENSATION:            Light touch: Appears intact            Proprioception: Appears intact                  POSTURE: No Significant postural limitations               PELVIC ALIGNMENT:   LUMBARAROM/PROM   A/PROM A/PROM   eval  Flexion full  Extension Decreased by 25%  Right lateral flexion Decreased by 25%  Left lateral flexion Decreased by 25%  Right rotation Decreased by 25%  Left rotation Decreased by 25%   (Blank rows = not tested)    PALPATION:   General  Patient was not able to push the therapist finger out of the anal canal                  External Perineal Exam tightness in the anal sphincter, perineal body, had hemorrhoids were apparent                             Internal Pelvic Floor thickness on the anterior rectum, thickness along the internal anal sphincter, decreased movement of the puborectalis, tenderness located on the levator ani   Patient confirms identification and approves PT to assess internal pelvic floor and treatment Yes   PELVIC MMT:   MMT eval 04/30/2022 06/07/2022 06/14/2022  Vaginal         Internal Anal Sphincter Ant. Is 1/5; post. 2/5 3/5 with hug of therapist finger 3/5 3/5  External Anal Sphincter Ant. Is 1/5; post. 2/5 3/5 with hug of therapist finger 3/5 3/5  Puborectalis 1/5 2/5 3/5 3/5  (Blank rows = not tested)         TONE: increased   PROLAPSE: Rectal prolapse   TODAY'S TREATMENT  06/14/2022 Manual: Internal pelvic floor techniques:No emotional/communication barriers or cognitive limitation. Patient is motivated to learn. Patient understands and agrees with treatment goals and plan. PT explains patient will be examined in standing, sitting, and lying down to see how their muscles and  joints work. When they are ready, they will be asked to remove their underwear so PT can examine their perineum. The patient is also given the option of providing their own chaperone as one is not provided in our facility. The patient also has the right and is explained the right to defer or refuse any part of the evaluation or treatment including the internal exam. With the patient's consent, PT will use one gloved finger to gently assess the muscles of the pelvic floor,  seeing how well it contracts and relaxes and if there is muscle symmetry. After, the patient will get dressed and PT and patient will discuss exam findings and plan of care. PT and patient discuss plan of care, schedule, attendance policy and HEP activities.  Going through the anus working on the left obturator internist, levator ani, along the superficial transverse perineum, along the internal anal sphincter Neuromuscular re-education: Pelvic floor contraction training:supine transverse abdominus with ball squeeze and pelvic floor contraction 10x hold 5 sec Supine with ball between knees and alternate shoulder extension pulling band from knees with ball between knees Supine ball between knees pulling band upward and engaging the pelvic floor and abdominals Tactile cues to the anus with with working on circular contraction and lift  Self-care: After urinating to contract the pelvic floor 5 x to reduce the pressure in the anus.     06/07/2022 Manual: Soft tissue mobilization:Manual work around the anus externally to open up the muscles for pushing stool out.  Internal pelvic floor techniques:No emotional/communication barriers or cognitive limitation. Patient is motivated to learn. Patient understands and agrees with treatment goals and plan. PT explains patient will be examined in standing, sitting, and lying down to see how their muscles and joints work. When they are ready, they will be asked to remove their underwear so PT can examine their perineum. The patient is also given the option of providing their own chaperone as one is not provided in our facility. The patient also has the right and is explained the right to defer or refuse any part of the evaluation or treatment including the internal exam. With the patient's consent, PT will use one gloved finger to gently assess the muscles of the pelvic floor, seeing how well it contracts and relaxes and if there is muscle symmetry. After, the patient  will get dressed and PT and patient will discuss exam findings and plan of care. PT and patient discuss plan of care, schedule, attendance policy and HEP activities.  Going through the anus working on the sphincters and releasing from internal and external, working around the levator ani and anococcygeal ligament Neuromuscular re-education: Pelvic floor contraction training:contraction of the anus holding 5 sec 10x; diaphragmatic breathing to open the anus and push the stool with tactile and verbal cues to increase the pressure.  Self-care: Educated patient on how to reduce urge to have a bowel movement by laying on her back with legs elevated and contracting the pelvic floor 5 times to reduce the urge Discussed with patient reduce her magnesium fy 1 tablet to firm up her stool    04/30/2022 Manual: Internal pelvic floor techniques:No emotional/communication barriers or cognitive limitation. Patient is motivated to learn. Patient understands and agrees with treatment goals and plan. PT explains patient will be examined in standing, sitting, and lying down to see how their muscles and joints work. When they are ready, they will be asked to remove their underwear so PT can examine their perineum. The patient is  also given the option of providing their own chaperone as one is not provided in our facility. The patient also has the right and is explained the right to defer or refuse any part of the evaluation or treatment including the internal exam. With the patient's consent, PT will use one gloved finger to gently assess the muscles of the pelvic floor, seeing how well it contracts and relaxes and if there is muscle symmetry. After, the patient will get dressed and PT and patient will discuss exam findings and plan of care. PT and patient discuss plan of care, schedule, attendance policy and HEP activities.                          Going through the anus working on the external and internal sphincter, along  the puborectalis, along the obturator internist, iliococcygeus, and anococcygeal ligament Neuromuscular re-education: Pelvic floor contraction training:pelvic floor contraction with therapist finger in the anus to give tactile cues for a circular and lift contraction Down training: Diaphragmatic breathing to elongate the pelvic floor Therapeutic activities: Functional strengthening activities:educated patient on correct toileting techniique using a squatty potty, correct breath, and knees above hips.  Self-care: Reviewed vaginal moisturizers and how to use them.      PATIENT EDUCATION:  06/07/2022 Education details: Access Code: [email or web address],  Person educated: Patient Education method: Explanation, Demonstration, Tactile cues, Verbal cues, and Handouts, you tube Education comprehension: verbalized understanding, returned demonstration, verbal cues required, tactile cues required, and needs further education     HOME EXERCISE PROGRAM: 06/14/2022 Access Code: [email or web address] URL: [email or web address] Date: 06/14/2022 Prepared by: Earlie Counts  Exercises - Hooklying Transversus Abdominis Palpation  - 1 x daily - 3 x weekly - 3 sets - 10 reps - Dead Bug Alternating Arm Extension  - 1 x daily - 3 x weekly - 3 sets - 10 reps    ASSESSMENT:   CLINICAL IMPRESSION: Patient is a 61 y.o. female who was seen today for physical therapy treatment for rectal prolapse. Anal strength is 3/5 with hug of therapist finger and circular. Patient has learned that the pressure she feels with urination should be resolved with contraction of the anus. She is having more type 4 stool with reducing her magnesium. She has more restrictions of the left pelvic floor compared to the right and has more pain in left hip. She will have more mucous when she strains. Patient will benefit from skilled therapy to improve health of tissue, coordination of the muscles and strength.      OBJECTIVE IMPAIRMENTS decreased  coordination, decreased endurance, decreased strength, increased fascial restrictions, and impaired tone.    ACTIVITY LIMITATIONS toileting   PARTICIPATION LIMITATIONS:  toileting   PERSONAL FACTORS Fitness are also affecting patient's functional outcome.    REHAB POTENTIAL: Excellent   CLINICAL DECISION MAKING: Stable/uncomplicated   EVALUATION COMPLEXITY: Low     GOALS: Goals reviewed with patient? Yes   SHORT TERM GOALS: Target date: 05/21/2022   Patient independent with massage of the external anus to relax the tissue.  Baseline: Goal status: Met 06/07/2022   2.  Patient able to perform diaphragmatic breathing to relax the anal tissue.  Baseline:  Goal status: Met 06/07/2022   3.  Patient reduced her straining with a bowel movement due to contracting the pelvic floor afterwards.  Baseline:  Goal status: Met 06/14/2022     LONG TERM GOALS: Target date: 07/16/2022    Patient is independent with advanced HEP  for pelvic floor.  Baseline:  Goal status: INITIAL   2.  Patient is able to have a bowel movement without straining due to improved coordination of the pelvic floor muscles.  Baseline:  Goal status: ongoing 06/14/2022   3.  Patient educated on how to manage rectal prolapse.  Baseline:  Goal status: ongoing 06/14/2022   4.  Pelvic floor strength >/= 3/5 to fully push the stool out of the rectum.  Baseline:  Goal status: ongoing 06/14/2022     PLAN: PT FREQUENCY: 1x/week   PT DURATION: 12 weeks   PLANNED INTERVENTIONS: Therapeutic exercises, Therapeutic activity, Neuromuscular re-education, Patient/Family education, Self Care, Joint mobilization, Dry Needling, Biofeedback, and Manual therapy   PLAN FOR NEXT SESSION: manual work to the anus on left side and work on pushing finger out of anus, advance  core strength   Earlie Counts, PT 06/14/22 8:48 AM

## 2022-06-18 ENCOUNTER — Encounter: Payer: Self-pay | Admitting: Psychiatry

## 2022-06-18 ENCOUNTER — Other Ambulatory Visit (HOSPITAL_COMMUNITY): Payer: Self-pay

## 2022-06-18 ENCOUNTER — Ambulatory Visit (INDEPENDENT_AMBULATORY_CARE_PROVIDER_SITE_OTHER): Payer: 59 | Admitting: Psychiatry

## 2022-06-18 DIAGNOSIS — F3342 Major depressive disorder, recurrent, in full remission: Secondary | ICD-10-CM | POA: Diagnosis not present

## 2022-06-18 DIAGNOSIS — F988 Other specified behavioral and emotional disorders with onset usually occurring in childhood and adolescence: Secondary | ICD-10-CM | POA: Diagnosis not present

## 2022-06-18 DIAGNOSIS — H5213 Myopia, bilateral: Secondary | ICD-10-CM | POA: Diagnosis not present

## 2022-06-18 DIAGNOSIS — F419 Anxiety disorder, unspecified: Secondary | ICD-10-CM

## 2022-06-18 MED ORDER — LISDEXAMFETAMINE DIMESYLATE 20 MG PO CAPS
20.0000 mg | ORAL_CAPSULE | Freq: Every day | ORAL | 0 refills | Status: DC
  Filled 2022-11-25: qty 90, 90d supply, fill #0

## 2022-06-18 MED ORDER — FLUOXETINE HCL 10 MG PO CAPS
30.0000 mg | ORAL_CAPSULE | Freq: Every day | ORAL | 1 refills | Status: DC
  Filled 2022-06-18 – 2022-06-24 (×2): qty 270, 90d supply, fill #0
  Filled 2022-10-01: qty 270, 90d supply, fill #1

## 2022-06-18 MED ORDER — ARIPIPRAZOLE 2 MG PO TABS
ORAL_TABLET | ORAL | 1 refills | Status: DC
  Filled 2022-06-18: qty 90, 90d supply, fill #0

## 2022-06-18 NOTE — Progress Notes (Signed)
Tammy [name] [id number] 10-27-1960 61 y.o.  Subjective:   Patient ID:  Tammy [name] is a 61 y.o. (DOB 21-Aug-1961) female.  Chief Complaint:  Chief Complaint  Patient presents with   Follow-up    Depression, anxiety, and ADHD    HPI Tammy [name] presents to the office today for follow-up of depression, anxiety, and ADHD. She reports that her depression has been well-controlled. Denies depressed mood. She reports that her energy and motivation have been good. She has been getting up at 5:15 am to get to the pool to swim by 6. Anxiety has been "fine." She had one episode of increased anxiety in response to family visiting her house and used 1/2 a tab of Klonopin to help get to sleep. Sleeping well overall. Appetite has been good. Concentration has been good. Denies SI.   She saw [facility] after feeling "out of control with my eating." She reports binge eating. She was anorexic as a teenager and got down to 75 lbs. She had bulimia in her 20's and 30's. She noticed that thoughts around eating have decreased. She reports that she has been working on "building healthy habits." She is swimming, walking, and exercising. She has been doing some strength training. She has seen a Physiological scientist.   Goes into the office one day a week and works virtually other days.   Continues to foster kittens.   Vyvanse last filled 05/06/22.  Klonopin 12/28/21  Past Psychiatric Medication Trials: Cymbalta Zoloft- Interfered with concentration and thinking was not clear Lexapro- Nocturnal panic attacks.   Prozac- Was on 40 mg of Prozac at the time she was having akathisia with Abilify 2 mg. Higher doses seemed to exacerbate akathisia Abilify- helpful. Started on 2 mg daily. Had akathisia and twitching at 5 mg Rexulti- Had very low motivation and fatigue Lamictal- rash Klonopin- Takes as needed. Did not help with rumination. Ambien Vyvanse- took 20 mg in the past for ADHD. Re-started 10 mg  dose. Remeron- Gained 20 lbs. Gabapentin- Given for neuropathy and caused cloudy thoughts.  Lamotrigine- tingling/burning around mouth and vagina. No rash or blisters.  May have been helpful for mood.   Had [employer] testing that indicated adverse effects to Zoloft, Lexapro, and Paxil.  [place] Office Visit from 06/18/2022 in [place] Office Visit from 07/29/2021 in [place] Visit from 06/09/2021 in [place] Total Score 0 0 0      GAD-7    [place] Office Visit from 02/18/2021 in [place]  Total GAD-7 Score 0      PHQ2-9    [place] Office Visit from 02/19/2022 in [place] Visit from 02/18/2021 in [place] Visit from 07/24/2020 in [place] Visit from 03/31/2020 in [place] Visit from 02/15/2020 in [place]  PHQ-2 Total Score 0 0 0 0 0  PHQ-9 Total Score 0 -- 0 0 0      Flowsheet Row ED from 10/24/2021 in [facility] [place] No Risk        Review of Systems:  Review of Systems  Cardiovascular:  Negative for palpitations.  Musculoskeletal:  Negative for gait problem.  Neurological:  Negative for tremors.  Psychiatric/Behavioral:         Please refer to HPI    Medications: I have reviewed  the patient's current medications.  Current Outpatient Medications  Medication Sig Dispense Refill   ACETAMINOPHEN-CAFFEINE PO Take by mouth in the morning.     clonazePAM (KLONOPIN) 0.5 MG tablet Take 1 tablet (0.5 mg total) by mouth daily as needed. 30 tablet 0   hydrochlorothiazide (HYDRODIURIL) 25 MG tablet Take 1 tablet (25 mg total) by mouth daily. 90 tablet 3   MAGNESIUM CHLORIDE-CALCIUM PO Take by mouth.     Melatonin 3 MG CAPS Take 6 mg by mouth at bedtime.      pantoprazole (PROTONIX) 20 MG tablet Take 1 tablet by mouth daily. 90 tablet 3   Probiotic Product (PROBIOTIC DAILY PO) Take by mouth.     Semaglutide-Weight Management (WEGOVY) 0.5 MG/0.5ML SOAJ Inject 0.5 mg subcutaneously as directed 2 mL 0   ARIPiprazole (ABILIFY) 2 MG tablet Take 1/2 to 1 tablet by mouth once daily. 90 tablet 1   FLUoxetine (PROZAC) 10 MG capsule Take 3 capsules (30 mg total) by mouth daily. 270 capsule 1   lisdexamfetamine (VYVANSE) 20 MG capsule Take 1 capsule by mouth daily. 90 capsule 0   [START ON 07/29/2022] lisdexamfetamine (VYVANSE) 20 MG capsule Take 1 capsule (20 mg total) by mouth daily. 90 capsule 0   ondansetron (ZOFRAN-ODT) 4 MG disintegrating tablet Dissolve 1 tablet on the tongue every 8 hours as needed 20 tablet 0   Semaglutide-Weight Management (WEGOVY) 0.25 MG/0.5ML SOAJ Inject 0.25 mg (Patient not taking: Reported on 06/18/2022) 2 mL 0   Semaglutide-Weight Management (WEGOVY) 1 MG/0.5ML SOAJ Inject 1 mg subcutaneously once a week (Patient not taking: Reported on 06/18/2022) 2 mL 0   Semaglutide-Weight Management (WEGOVY) 1.7 MG/0.75ML SOAJ Inject 1 pen (1.7 MG) into the skin once a week (Patient not taking: Reported on 06/18/2022) 3 mL 0   No current facility-administered medications for this visit.    Medication Side Effects: None  Allergies:  Allergies  Allergen Reactions   Eggs Or Egg-Derived Products Other (See Comments)    Makes pt feel like burping up rotten eggs    Past Medical History:  Diagnosis Date   Arthritis    Chronic fatigue syndrome    Diverticulosis    External hemorrhoids    Hypertension    Internal hemorrhoids    Neuropathy    Spinal stenosis     Past Medical History, Surgical history, Social history, and Family history were reviewed and updated as appropriate.   Please see review of systems for further details on the patient's review from today.   Objective:   Physical Exam:  BP 128/80   Pulse 78   Wt 148 lb (67.1  kg)   LMP 11/05/2007   BMI 27.96 kg/m   Physical Exam Constitutional:      General: She is not in acute distress. Musculoskeletal:        General: No deformity.  Neurological:     Mental Status: She is alert and oriented to person, place, and time.     Coordination: Coordination normal.  Psychiatric:        Attention and Perception: Attention and perception normal. She does not perceive auditory or visual hallucinations.        Mood and Affect: Mood normal. Mood is not anxious or depressed. Affect is not labile, blunt, angry or inappropriate.        Speech: Speech normal.        Behavior: Behavior normal.        Thought Content: Thought content normal. Thought content is not  paranoid or delusional. Thought content does not include homicidal or suicidal ideation. Thought content does not include homicidal or suicidal plan.        Cognition and Memory: Cognition and memory normal.        Judgment: Judgment normal.     Comments: Insight intact     Lab Review:     Component Value Date/Time   NA 139 02/19/2022 0912   K 4.1 02/19/2022 0912   CL 101 02/19/2022 0912   CO2 27 02/19/2022 0912   GLUCOSE 70 02/19/2022 0912   BUN 16 02/19/2022 0912   CREATININE 0.72 02/19/2022 0912   CREATININE 0.83 02/15/2020 1627   CALCIUM 9.9 02/19/2022 0912   PROT 7.3 02/19/2022 0912   ALBUMIN 4.4 02/19/2022 0912   AST 19 02/19/2022 0912   ALT 19 02/19/2022 0912   ALKPHOS 59 02/19/2022 0912   BILITOT 0.4 02/19/2022 0912   GFRNONAA >90 09/18/2012 0525   GFRAA >90 09/18/2012 0525       Component Value Date/Time   WBC 3.6 (L) 02/19/2022 0912   RBC 4.68 02/19/2022 0912   HGB 12.9 02/19/2022 0912   HCT 38.6 02/19/2022 0912   PLT 204.0 02/19/2022 0912   MCV 82.4 02/19/2022 0912   MCH 28.0 02/15/2020 1627   MCHC 33.3 02/19/2022 0912   RDW 14.7 02/19/2022 0912   LYMPHSABS 0.9 02/19/2022 0912   MONOABS 0.3 02/19/2022 0912   EOSABS 0.2 02/19/2022 0912   BASOSABS 0.0 02/19/2022 0912    No  results found for: "POCLITH", "LITHIUM"   No results found for: "PHENYTOIN", "PHENOBARB", "VALPROATE", "CBMZ"   .res Assessment: Plan:   Continue Abilify 2 mg 1/2 tab po qd for depression.  Continue Prozac 30 mg po qd for depression and anxiety.  Continue Klonopin 0.5 mg po qd prn anxiety. She reports that she currently has an adequate supply. Will send script to be put on file. Continue Vyvnase 20 mg po qd for ADHD.  Pt to follow-up in 6 months or sooner if clinically indicated.  Requested pt call in 3 months to provide update and request additional scripts.  Patient advised to contact office with any questions, adverse effects, or acute worsening in signs and symptoms.  Athalie was seen today for follow-up.  Diagnoses and all orders for this visit:  Recurrent major depressive disorder, in full remission ([place]) -     ARIPiprazole (ABILIFY) 2 MG tablet; Take 1/2 to 1 tablet by mouth once daily. -     FLUoxetine (PROZAC) 10 MG capsule; Take 3 capsules (30 mg total) by mouth daily.  Anxiety -     FLUoxetine (PROZAC) 10 MG capsule; Take 3 capsules (30 mg total) by mouth daily.  Attention deficit disorder (ADD) in adult -     lisdexamfetamine (VYVANSE) 20 MG capsule; Take 1 capsule (20 mg total) by mouth daily.     Please see After Visit Summary for patient specific instructions.  Future Appointments  Date Time Provider [place]  06/19/2022  1:10 PM GI-315 MR 2 [facility]-315MRI [facility]  06/19/2022  1:40 PM GI-315 MR 2 GI-315MRI [facility]  06/28/2022  8:00 AM Earlie Counts F, PT [facility] None  07/05/2022  8:00 AM Monico Hoar, PT [facility] None  07/12/2022  8:00 AM Monico Hoar, PT [facility] None  12/17/2022  1:15 PM Thayer Headings, PMHNP [facility] None  02/22/2023  8:00 AM [name], MD [facility]    No orders of the defined types were  placed in this encounter.   -------------------------------

## 2022-06-19 ENCOUNTER — Ambulatory Visit
Admission: RE | Admit: 2022-06-19 | Discharge: 2022-06-19 | Disposition: A | Discharge status: Home / Self Care | Payer: 59 | Source: Ambulatory Visit | Attending: Family Medicine | Admitting: Family Medicine

## 2022-06-19 DIAGNOSIS — M9903 Segmental and somatic dysfunction of lumbar region: Secondary | ICD-10-CM

## 2022-06-19 DIAGNOSIS — M48061 Spinal stenosis, lumbar region without neurogenic claudication: Secondary | ICD-10-CM | POA: Diagnosis not present

## 2022-06-19 DIAGNOSIS — M5126 Other intervertebral disc displacement, lumbar region: Secondary | ICD-10-CM | POA: Diagnosis not present

## 2022-06-19 DIAGNOSIS — S73192A Other sprain of left hip, initial encounter: Secondary | ICD-10-CM | POA: Diagnosis not present

## 2022-06-19 DIAGNOSIS — M24552 Contracture, left hip: Secondary | ICD-10-CM

## 2022-06-21 ENCOUNTER — Other Ambulatory Visit (HOSPITAL_COMMUNITY): Payer: Self-pay

## 2022-06-23 ENCOUNTER — Encounter: Payer: Self-pay | Admitting: Family Medicine

## 2022-06-23 ENCOUNTER — Encounter (HOSPITAL_BASED_OUTPATIENT_CLINIC_OR_DEPARTMENT_OTHER): Payer: Self-pay | Admitting: Physical Therapy

## 2022-06-23 DIAGNOSIS — F5081 Binge eating disorder: Secondary | ICD-10-CM | POA: Diagnosis not present

## 2022-06-23 DIAGNOSIS — E782 Mixed hyperlipidemia: Secondary | ICD-10-CM | POA: Diagnosis not present

## 2022-06-23 DIAGNOSIS — E669 Obesity, unspecified: Secondary | ICD-10-CM | POA: Diagnosis not present

## 2022-06-23 DIAGNOSIS — G894 Chronic pain syndrome: Secondary | ICD-10-CM | POA: Diagnosis not present

## 2022-06-24 ENCOUNTER — Other Ambulatory Visit (HOSPITAL_COMMUNITY): Payer: Self-pay

## 2022-06-24 DIAGNOSIS — F411 Generalized anxiety disorder: Secondary | ICD-10-CM | POA: Diagnosis not present

## 2022-06-24 DIAGNOSIS — F332 Major depressive disorder, recurrent severe without psychotic features: Secondary | ICD-10-CM | POA: Diagnosis not present

## 2022-06-25 ENCOUNTER — Encounter: Payer: Self-pay | Admitting: Family Medicine

## 2022-06-25 ENCOUNTER — Encounter: Payer: 59 | Admitting: Physical Therapy

## 2022-06-25 ENCOUNTER — Ambulatory Visit (INDEPENDENT_AMBULATORY_CARE_PROVIDER_SITE_OTHER): Payer: 59 | Admitting: Family Medicine

## 2022-06-25 DIAGNOSIS — M1612 Unilateral primary osteoarthritis, left hip: Secondary | ICD-10-CM | POA: Insufficient documentation

## 2022-06-25 NOTE — Progress Notes (Signed)
Virtual Visit via Video Note  I connected with MIREL HUNDAL on 06/25/22 at  3:30 PM EDT by a video enabled telemedicine application and verified that I am speaking with the correct person using two identifiers.  Location: Patient:  Provider:    I discussed the limitations of evaluation and management by telemedicine and the availability of in person appointments. The patient expressed understanding and agreed to proceed.  History of Present Illness: Patient is a very pleasant 61 year old female who was having bilateral hip and back pain.  Seem to be left greater than right.  Sent for imaging.  This was after failing all conservative therapy.  MRI of the lumbar spine did show the patient does have spinal stenosis mild to moderate at the L4-L5 area.  Unfortunately the MRI of the pelvis though did show high-grade partial-thickness cartilage loss mostly of the left femoral head with moderate to severe osteoarthritic changes.    Observations/Objective: Did not have a visual platform and only had phone at this time.  Assessment and Plan: Arthritis of left hip Patient does have arthritis of the left hip.  It is fairly severe at this moment.  We did discuss different treatment options but I did discuss with patient that I do feel that it is more beneficial for patient to consider surgical replacement.  Patient is stating that she is having more and more discomfort even in her knee and her back is secondary to compensating for this hip.  Patient will be referred today to further discuss surgical intervention and will follow-up with me again as needed   Follow Up Instructions:    I discussed the assessment and treatment plan with the patient. The patient was provided an opportunity to ask questions and all were answered. The patient agreed with the plan and demonstrated an understanding of the instructions.   The patient was advised to call back or seek an in-person evaluation if the symptoms  worsen or if the condition fails to improve as anticipated.  I provided 27 minutes of non-face-to-face time during this encounter.   [name], DO

## 2022-06-25 NOTE — Assessment & Plan Note (Signed)
Patient does have arthritis of the left hip.  It is fairly severe at this moment.  We did discuss different treatment options but I did discuss with patient that I do feel that it is more beneficial for patient to consider surgical replacement.  Patient is stating that she is having more and more discomfort even in her knee and her back is secondary to compensating for this hip.  Patient will be referred today to further discuss surgical intervention and will follow-up with me again as needed

## 2022-06-28 ENCOUNTER — Encounter: Payer: Self-pay | Admitting: Physical Therapy

## 2022-06-28 ENCOUNTER — Ambulatory Visit: Payer: 59 | Attending: Internal Medicine | Admitting: Physical Therapy

## 2022-06-28 DIAGNOSIS — M6281 Muscle weakness (generalized): Secondary | ICD-10-CM | POA: Insufficient documentation

## 2022-06-28 DIAGNOSIS — R278 Other lack of coordination: Secondary | ICD-10-CM | POA: Diagnosis not present

## 2022-06-28 NOTE — Therapy (Signed)
OUTPATIENT PHYSICAL THERAPY TREATMENT NOTE   Patient Name: Tammy [name] MRN: [id number] DOB:1960/12/13, 61 y.o., female Today's Date: 06/28/2022  PCP: [name], MD REFERRING PROVIDER: Jerene Bears, MD  END OF SESSION:   PT End of Session - 06/28/22 0844     Visit Number 5    Date for PT Re-Evaluation 07/16/22    Authorization Type [employer]    Authorization - Visit Number 20    Authorization - Number of Visits 27    PT Start Time 0800    PT Stop Time 0840    PT Time Calculation (min) 40 min    Activity Tolerance Patient tolerated treatment well    Behavior During Therapy [facility] for tasks assessed/performed             Past Medical History:  Diagnosis Date   Arthritis    Chronic fatigue syndrome    Diverticulosis    External hemorrhoids    Hypertension    Internal hemorrhoids    Neuropathy    Spinal stenosis    Past Surgical History:  Procedure Laterality Date   CARDIAC CATHETERIZATION  09/18/2012   LEFT HEART CATHETERIZATION WITH CORONARY ANGIOGRAM N/A 09/18/2012   Procedure: LEFT HEART CATHETERIZATION WITH CORONARY ANGIOGRAM;  Surgeon: Sherren Mocha, MD;  Location: [facility] CATH LAB;  Service: Cardiovascular;  Laterality: N/A;   TONSILLECTOMY  1965   Patient Active Problem List   Diagnosis Date Noted   Arthritis of left hip 06/25/2022   Hip flexor tendon tightness, left 04/02/2022   Greater trochanteric bursitis of left hip 01/11/2022   Anxiety 07/24/2020   Nonallopathic lesion of cervical region 04/03/2020   Nonallopathic lesion of thoracic region 04/03/2020   Nonallopathic lesion of lumbar region 04/03/2020   Cervical stenosis of spine 03/31/2020   Attention deficit disorder (ADD) in adult 03/31/2020   Low bone density 03/17/2020   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[place]) 02/15/2020   GERD (gastroesophageal reflux disease) 02/15/2020   Allergic rhinitis 03/09/2016   Essential hypertension 02/18/2014   Chronic pain syndrome 02/22/2013    Chronic insomnia 02/22/2013   Paresthesias 10/25/2012   Hyperlipidemia 09/18/2012   REFERRING DIAG: K62.3 (ICD-10-CM) - Rectal prolapse THERAPY DIAG:  Muscle weakness (generalized)   Other lack of coordination   Rationale for Evaluation and Treatment Rehabilitation   ONSET DATE: 04/23/2021   SUBJECTIVE:                                                                                                                                                                                            SUBJECTIVE STATEMENT: I will be needing a  hip replacement. I have been doing my exercises and not straining.    PAIN:  Are you having pain? No   PRECAUTIONS: None   WEIGHT BEARING RESTRICTIONS No   FALLS:  Has patient fallen in last 6 months? 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   PLOF: Independent   PATIENT GOALS  education on rectal prolapse management and strengthen PERTINENT HISTORY:  Chronic fatigue syndrome, Diverticulosis, External and internal hemorrhoids   BOWEL MOVEMENT Pain with bowel movement: No Type of bowel movement:Type (Bristol Stool Scale) Type 5 or 6, Frequency daily, Strain Yes, and Splinting no; feels like straining when the prolapse is out Fully empty rectum: Yes:   Leakage: No, mucous leakage Pads: No Fiber supplement: Yes: magnesium , uses glycerin suppositories every morning      PROLAPSE Rectal prolapse     OBJECTIVE:    DIAGNOSTIC FINDINGS:  none   PATIENT SURVEYS:  none   COGNITION:            Overall cognitive status: Within functional limits for tasks assessed                          SENSATION:            Light touch: Appears intact            Proprioception: Appears intact                  POSTURE: No Significant postural limitations               PELVIC ALIGNMENT:   LUMBARAROM/PROM   A/PROM A/PROM  eval  Flexion full  Extension Decreased by 25%   Right lateral flexion Decreased by 25%  Left lateral flexion Decreased by 25%  Right rotation Decreased by 25%  Left rotation Decreased by 25%   (Blank rows = not tested)    PALPATION:   General  Patient was not able to push the therapist finger out of the anal canal                  External Perineal Exam tightness in the anal sphincter, perineal body, had hemorrhoids were apparent                             Internal Pelvic Floor thickness on the anterior rectum, thickness along the internal anal sphincter, decreased movement of the puborectalis, tenderness located on the levator ani   Patient confirms identification and approves PT to assess internal pelvic floor and treatment Yes   PELVIC MMT:   MMT eval 04/30/2022 06/07/2022 06/14/2022  Vaginal          Internal Anal Sphincter Ant. Is 1/5; post. 2/5 3/5 with hug of therapist finger 3/5 3/5  External Anal Sphincter Ant. Is 1/5; post. 2/5 3/5 with hug of therapist finger 3/5 3/5  Puborectalis 1/5 2/5 3/5 3/5  (Blank rows = not tested)         TONE: increased   PROLAPSE: Rectal prolapse   TODAY'S TREATMENT  06/28/2022 Manual: Internal pelvic floor techniques:No emotional/communication barriers or cognitive limitation. Patient is motivated to learn. Patient understands and agrees with treatment goals and plan. PT explains patient will be examined in standing, sitting, and lying down to see how their muscles and joints work. When they are ready,  they will be asked to remove their underwear so PT can examine their perineum. The patient is also given the option of providing their own chaperone as one is not provided in our facility. The patient also has the right and is explained the right to defer or refuse any part of the evaluation or treatment including the internal exam. With the patient's consent, PT will use one gloved finger to gently assess the muscles of the pelvic floor, seeing how well it contracts and relaxes and if there  is muscle symmetry. After, the patient will get dressed and PT and patient will discuss exam findings and plan of care. PT and patient discuss plan of care, schedule, attendance policy and HEP activities.  Manual work to the anococcygeal ligament, along the puborectalis, the internal sphincter Neuromuscular re-education: Core facilitation:Bird dog with abdominal contraction 10x each Dead bug 10x each way Pelvic floor contraction training:supine with therapist finger in the anus working on pushing the therapist finger out with pushing on her knees.   Anal contraction around the therapist finger with a lift and adding the abdominals at the end, working on 5 second , 10 second, quick flicks Self-care: Instructed patient on how to splint the rectal area to have a bowel movement with greater ease and get the last bit out.     06/14/2022 Manual: Internal pelvic floor techniques:No emotional/communication barriers or cognitive limitation. Patient is motivated to learn. Patient understands and agrees with treatment goals and plan. PT explains patient will be examined in standing, sitting, and lying down to see how their muscles and joints work. When they are ready, they will be asked to remove their underwear so PT can examine their perineum. The patient is also given the option of providing their own chaperone as one is not provided in our facility. The patient also has the right and is explained the right to defer or refuse any part of the evaluation or treatment including the internal exam. With the patient's consent, PT will use one gloved finger to gently assess the muscles of the pelvic floor, seeing how well it contracts and relaxes and if there is muscle symmetry. After, the patient will get dressed and PT and patient will discuss exam findings and plan of care. PT and patient discuss plan of care, schedule, attendance policy and HEP activities.  Going through the anus working on the left obturator  internist, levator ani, along the superficial transverse perineum, along the internal anal sphincter Neuromuscular re-education: Pelvic floor contraction training:supine transverse abdominus with ball squeeze and pelvic floor contraction 10x hold 5 sec Supine with ball between knees and alternate shoulder extension pulling band from knees with ball between knees Supine ball between knees pulling band upward and engaging the pelvic floor and abdominals Tactile cues to the anus with with working on circular contraction and lift   Self-care: After urinating to contract the pelvic floor 5 x to reduce the pressure in the anus.     06/07/2022 Manual: Soft tissue mobilization:Manual work around the anus externally to open up the muscles for pushing stool out.  Internal pelvic floor techniques:No emotional/communication barriers or cognitive limitation. Patient is motivated to learn. Patient understands and agrees with treatment goals and plan. PT explains patient will be examined in standing, sitting, and lying down to see how their muscles and joints work. When they are ready, they will be asked to remove their underwear so PT can examine their perineum. The patient is also given the option of  providing their own chaperone as one is not provided in our facility. The patient also has the right and is explained the right to defer or refuse any part of the evaluation or treatment including the internal exam. With the patient's consent, PT will use one gloved finger to gently assess the muscles of the pelvic floor, seeing how well it contracts and relaxes and if there is muscle symmetry. After, the patient will get dressed and PT and patient will discuss exam findings and plan of care. PT and patient discuss plan of care, schedule, attendance policy and HEP activities.  Going through the anus working on the sphincters and releasing from internal and external, working around the levator ani and anococcygeal  ligament Neuromuscular re-education: Pelvic floor contraction training:contraction of the anus holding 5 sec 10x; diaphragmatic breathing to open the anus and push the stool with tactile and verbal cues to increase the pressure.  Self-care: Educated patient on how to reduce urge to have a bowel movement by laying on her back with legs elevated and contracting the pelvic floor 5 times to reduce the urge Discussed with patient reduce her magnesium fy 1 tablet to firm up her stool      PATIENT EDUCATION:  06/28/2022 Education details: Access Code: [email or web address] [phone],  Person educated: Patient Education method: Explanation, Demonstration, Tactile cues, Verbal cues, and Handouts, you tube Education comprehension: verbalized understanding, returned demonstration, verbal cues required, tactile cues required, and needs further education     HOME EXERCISE PROGRAM: 06/28/2022 Access Code: [email or web address] URL: [email or web address] Date: 06/28/2022 Prepared by: Earlie Counts  Exercises - Supine Diaphragmatic Breathing  - 1 x daily - 7 x weekly - 3 sets - 10 reps - Seated Diaphragmatic Breathing  - 1 x daily - 7 x weekly - 1 sets - 10 reps - Seated Pelvic Floor Contraction  - 3 x daily - 7 x weekly - 1 sets - 10 reps - 5 sec hold - Hooklying Transversus Abdominis Palpation  - 1 x daily - 3 x weekly - 3 sets - 10 reps - Dead Bug  - 1 x daily - 3 x weekly - 2 sets - 10 reps - Quadruped Pelvic Floor Contraction with Opposite Arm and Leg Lift  - 1 x daily - 3 x weekly - 2 sets - 10 reps  ASSESSMENT:   CLINICAL IMPRESSION: Patient is a 61 y.o. female who was seen today for physical therapy treatment for rectal prolapse. Anal strength is 3/5 with hug of therapist finger and circular. Patient needed assistance with the lift of the pelvic floor with end engagement of the abdominals. Patient needs to push on her knees to push the therapist finger out of the canal. Patient will benefit from skilled therapy to  improve health of tissue, coordination of the muscles and strength.      OBJECTIVE IMPAIRMENTS decreased coordination, decreased endurance, decreased strength, increased fascial restrictions, and impaired tone.    ACTIVITY LIMITATIONS toileting   PARTICIPATION LIMITATIONS:  toileting   PERSONAL FACTORS Fitness are also affecting patient's functional outcome.    REHAB POTENTIAL: Excellent   CLINICAL DECISION MAKING: Stable/uncomplicated   EVALUATION COMPLEXITY: Low     GOALS: Goals reviewed with patient? Yes   SHORT TERM GOALS: Target date: 05/21/2022   Patient independent with massage of the external anus to relax the tissue.  Baseline: Goal status: Met 06/07/2022   2.  Patient able to perform diaphragmatic breathing to relax the anal tissue.  Baseline:  Goal status: Met 06/07/2022  3.  Patient reduced her straining with a bowel movement due to contracting the pelvic floor afterwards.  Baseline:  Goal status: Met 06/14/2022     LONG TERM GOALS: Target date: 07/16/2022    Patient is independent with advanced HEP for pelvic floor.  Baseline:  Goal status: INITIAL   2.  Patient is able to have a bowel movement without straining due to improved coordination of the pelvic floor muscles.  Baseline:  Goal status: ongoing 06/14/2022   3.  Patient educated on how to manage rectal prolapse.  Baseline:  Goal status: ongoing 06/14/2022   4.  Pelvic floor strength >/= 3/5 to fully push the stool out of the rectum.  Baseline:  Goal status: ongoing 06/14/2022     PLAN: PT FREQUENCY: 1x/week   PT DURATION: 12 weeks   PLANNED INTERVENTIONS: Therapeutic exercises, Therapeutic activity, Neuromuscular re-education, Patient/Family education, Self Care, Joint mobilization, Dry Needling, Biofeedback, and Manual therapy   PLAN FOR NEXT SESSION: fo over visit number for hip replacement, work on pushing finger out of anus, advance  core strength     Earlie Counts, PT 06/28/22 8:46 AM

## 2022-06-29 ENCOUNTER — Other Ambulatory Visit: Payer: Self-pay

## 2022-06-29 DIAGNOSIS — M7062 Trochanteric bursitis, left hip: Secondary | ICD-10-CM

## 2022-07-02 ENCOUNTER — Other Ambulatory Visit (HOSPITAL_COMMUNITY): Payer: Self-pay

## 2022-07-05 ENCOUNTER — Encounter: Payer: Self-pay | Admitting: Physical Therapy

## 2022-07-05 ENCOUNTER — Ambulatory Visit: Payer: 59 | Admitting: Physical Therapy

## 2022-07-05 DIAGNOSIS — M6281 Muscle weakness (generalized): Secondary | ICD-10-CM

## 2022-07-05 DIAGNOSIS — R278 Other lack of coordination: Secondary | ICD-10-CM

## 2022-07-05 NOTE — Therapy (Signed)
OUTPATIENT PHYSICAL THERAPY TREATMENT NOTE   Patient Name: Tammy [name] MRN: [id number] DOB:24-Jan-1961, 61 y.o., female Today's Date: 07/05/2022  PCP: [name], MD REFERRING PROVIDER: Jerene Bears, MD  END OF SESSION:   PT End of Session - 07/05/22 0805     Visit Number 6    Date for PT Re-Evaluation 07/16/22    Authorization Type [employer]    Authorization - Visit Number 21    Authorization - Number of Visits 25    PT Start Time 0800    PT Stop Time 0840    PT Time Calculation (min) 40 min    Activity Tolerance Patient tolerated treatment well    Behavior During Therapy [facility] for tasks assessed/performed             Past Medical History:  Diagnosis Date   Arthritis    Chronic fatigue syndrome    Diverticulosis    External hemorrhoids    Hypertension    Internal hemorrhoids    Neuropathy    Spinal stenosis    Past Surgical History:  Procedure Laterality Date   CARDIAC CATHETERIZATION  09/18/2012   LEFT HEART CATHETERIZATION WITH CORONARY ANGIOGRAM N/A 09/18/2012   Procedure: LEFT HEART CATHETERIZATION WITH CORONARY ANGIOGRAM;  Surgeon: Sherren Mocha, MD;  Location: [facility] CATH LAB;  Service: Cardiovascular;  Laterality: N/A;   TONSILLECTOMY  1965   Patient Active Problem List   Diagnosis Date Noted   Arthritis of left hip 06/25/2022   Hip flexor tendon tightness, left 04/02/2022   Greater trochanteric bursitis of left hip 01/11/2022   Anxiety 07/24/2020   Nonallopathic lesion of cervical region 04/03/2020   Nonallopathic lesion of thoracic region 04/03/2020   Nonallopathic lesion of lumbar region 04/03/2020   Cervical stenosis of spine 03/31/2020   Attention deficit disorder (ADD) in adult 03/31/2020   Low bone density 03/17/2020   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[place]) 02/15/2020   GERD (gastroesophageal reflux disease) 02/15/2020   Allergic rhinitis 03/09/2016   Essential hypertension 02/18/2014   Chronic pain syndrome  02/22/2013   Chronic insomnia 02/22/2013   Paresthesias 10/25/2012   Hyperlipidemia 09/18/2012   REFERRING DIAG: K62.3 (ICD-10-CM) - Rectal prolapse THERAPY DIAG:  Muscle weakness (generalized)   Other lack of coordination   Rationale for Evaluation and Treatment Rehabilitation   ONSET DATE: 04/23/2021   SUBJECTIVE:                                                                                                                                                                                            SUBJECTIVE STATEMENT: I am practicing not straining  and not doing the exercises.    PAIN:  Are you having pain? No   PRECAUTIONS: None   WEIGHT BEARING RESTRICTIONS No   FALLS:  Has patient fallen in last 6 months? 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   PLOF: Independent   PATIENT GOALS  education on rectal prolapse management and strengthen PERTINENT HISTORY:  Chronic fatigue syndrome, Diverticulosis, External and internal hemorrhoids   BOWEL MOVEMENT Pain with bowel movement: No Type of bowel movement:Type (Bristol Stool Scale) Type 5 or 6, Frequency daily, Strain Yes, and Splinting no; feels like straining when the prolapse is out Fully empty rectum: Yes:   Leakage: No, mucous leakage Pads: No Fiber supplement: Yes: magnesium , uses glycerin suppositories every morning      PROLAPSE Rectal prolapse     OBJECTIVE:    DIAGNOSTIC FINDINGS:  none   PATIENT SURVEYS:  none   COGNITION:            Overall cognitive status: Within functional limits for tasks assessed                          SENSATION:            Light touch: Appears intact            Proprioception: Appears intact                  POSTURE: No Significant postural limitations               PELVIC ALIGNMENT:   LUMBARAROM/PROM   A/PROM A/PROM  eval 07/05/2022  Flexion full full  Extension Decreased by 25%  Decreased by 25%  Right lateral flexion Decreased by 25% Decreased by 25%  Left lateral flexion Decreased by 25% Decreased by 25%  Right rotation Decreased by 25% Decreased by 25%  Left rotation Decreased by 25% Decreased by 25%   (Blank rows = not tested)    PALPATION:                                           Patient confirms identification and approves PT to assess internal pelvic floor and treatment Yes   PELVIC MMT:   MMT eval 04/30/2022 06/07/2022 06/14/2022 07/05/2022  Vaginal           Internal Anal Sphincter Ant. Is 1/5; post. 2/5 3/5 with hug of therapist finger 3/5 3/5 4/5   External Anal Sphincter Ant. Is 1/5; post. 2/5 3/5 with hug of therapist finger 3/5 3/5 4/5  Puborectalis 1/5 2/5 3/5 3/5 4/5  (Blank rows = not tested)         TONE: average  PROLAPSE: Rectal prolapse   TODAY'S TREATMENT  07/05/2022 Manual: Internal pelvic floor techniques:No emotional/communication barriers or cognitive limitation. Patient is motivated to learn. Patient understands and agrees with treatment goals and plan. PT explains patient will be examined in standing, sitting, and lying down to see how their muscles and joints work. When they are ready, they will be asked to remove their underwear so PT can examine their perineum. The patient is also given the option of providing their own chaperone as one is not provided in our facility. The patient also has the right and is explained the right to  defer or refuse any part of the evaluation or treatment including the internal exam. With the patient's consent, PT will use one gloved finger to gently assess the muscles of the pelvic floor, seeing how well it contracts and relaxes and if there is muscle symmetry. After, the patient will get dressed and PT and patient will discuss exam findings and plan of care. PT and patient discuss plan of care, schedule, attendance policy and HEP activities.  Manual work to the internal and external anal sphincter  and puborectalis in left side Breathing out to push the therapist finger out of the canal while pushing on her knees to engage the lower abdominals further Neuromuscular re-education: Pelvic floor contraction training:while therapist finger int he anal canal contracting the pelvic floor holding for 10 sec  Quadruped lift opposite extremity with abdominal contraction with pelvic floor contraction Dead bug with red band around knees  and pelvic floor contraction Sitting pelvic floor contraction holding for 10 sec 49E then 5 quick flicks    01/0/0712 Manual: Internal pelvic floor techniques:No emotional/communication barriers or cognitive limitation. Patient is motivated to learn. Patient understands and agrees with treatment goals and plan. PT explains patient will be examined in standing, sitting, and lying down to see how their muscles and joints work. When they are ready, they will be asked to remove their underwear so PT can examine their perineum. The patient is also given the option of providing their own chaperone as one is not provided in our facility. The patient also has the right and is explained the right to defer or refuse any part of the evaluation or treatment including the internal exam. With the patient's consent, PT will use one gloved finger to gently assess the muscles of the pelvic floor, seeing how well it contracts and relaxes and if there is muscle symmetry. After, the patient will get dressed and PT and patient will discuss exam findings and plan of care. PT and patient discuss plan of care, schedule, attendance policy and HEP activities.  Manual work to the anococcygeal ligament, along the puborectalis, the internal sphincter Neuromuscular re-education: Core facilitation:Bird dog with abdominal contraction 10x each Dead bug 10x each way Pelvic floor contraction training:supine with therapist finger in the anus working on pushing the therapist finger out with pushing on her  knees.            Anal contraction around the therapist finger with a lift and adding the abdominals at the end, working on 5 second , 10 second, quick flicks Self-care: Instructed patient on how to splint the rectal area to have a bowel movement with greater ease and get the last bit out.     06/14/2022 Manual: Internal pelvic floor techniques:No emotional/communication barriers or cognitive limitation. Patient is motivated to learn. Patient understands and agrees with treatment goals and plan. PT explains patient will be examined in standing, sitting, and lying down to see how their muscles and joints work. When they are ready, they will be asked to remove their underwear so PT can examine their perineum. The patient is also given the option of providing their own chaperone as one is not provided in our facility. The patient also has the right and is explained the right to defer or refuse any part of the evaluation or treatment including the internal exam. With the patient's consent, PT will use one gloved finger to gently assess the muscles of the pelvic floor, seeing how well it contracts and relaxes and if there  is muscle symmetry. After, the patient will get dressed and PT and patient will discuss exam findings and plan of care. PT and patient discuss plan of care, schedule, attendance policy and HEP activities.  Going through the anus working on the left obturator internist, levator ani, along the superficial transverse perineum, along the internal anal sphincter Neuromuscular re-education: Pelvic floor contraction training:supine transverse abdominus with ball squeeze and pelvic floor contraction 10x hold 5 sec Supine with ball between knees and alternate shoulder extension pulling band from knees with ball between knees Supine ball between knees pulling band upward and engaging the pelvic floor and abdominals Tactile cues to the anus with with working on circular contraction and lift    Self-care: After urinating to contract the pelvic floor 5 x to reduce the pressure in the anus.       PATIENT EDUCATION:  07/05/2022 Education details: Access Code: [phone] Person educated: Patient Education method: Explanation, Demonstration, Tactile cues, Verbal cues, and Handouts, you tube Education comprehension: verbalized understanding, returned demonstration, verbal cues required, tactile cues required, and needs further education     HOME EXERCISE PROGRAM: 07/05/2022 Access Code: [email or web address] URL: [email or web address] Date: 07/05/2022 Prepared by: Earlie Counts  Exercises - Supine Diaphragmatic Breathing  - 1 x daily - 7 x weekly - 3 sets - 10 reps - Seated Diaphragmatic Breathing  - 1 x daily - 7 x weekly - 1 sets - 10 reps - Hooklying Transversus Abdominis Palpation  - 1 x daily - 3 x weekly - 3 sets - 10 reps - Dead Bug  - 1 x daily - 3 x weekly - 2 sets - 10 reps - Quadruped Pelvic Floor Contraction with Opposite Arm and Leg Lift  - 1 x daily - 3 x weekly - 2 sets - 10 reps - Seated Pelvic Floor Contraction  - 3 x daily - 7 x weekly - 1 sets - 10 reps - 10 sec hold - Seated Quick Flick Pelvic Floor Contractions  - 3 x daily - 7 x weekly - 1 sets - 5 reps   ASSESSMENT:   CLINICAL IMPRESSION: Patient is a 61 y.o. female who was seen today for physical therapy treatment for rectal prolapse. Anal strength is 4/5 with hug of therapist finger and circular holding for 10 sec. Patient is able to push the therapist finger out of the canal when she presses on her knees to engage the lower abdomen. Patient is straining less with bowel movements. She is independent with her HEP. Patient understands how to manage her prolapse.     OBJECTIVE IMPAIRMENTS decreased coordination, decreased endurance, decreased strength, increased fascial restrictions, and impaired tone.    ACTIVITY LIMITATIONS toileting   PARTICIPATION LIMITATIONS:  toileting   PERSONAL FACTORS Fitness  are also affecting patient's functional outcome.    REHAB POTENTIAL: Excellent   CLINICAL DECISION MAKING: Stable/uncomplicated   EVALUATION COMPLEXITY: Low     GOALS: Goals reviewed with patient? Yes   SHORT TERM GOALS: Target date: 05/21/2022   Patient independent with massage of the external anus to relax the tissue.  Baseline: Goal status: Met 06/07/2022   2.  Patient able to perform diaphragmatic breathing to relax the anal tissue.  Baseline:  Goal status: Met 06/07/2022   3.  Patient reduced her straining with a bowel movement due to contracting the pelvic floor afterwards.  Baseline:  Goal status: Met 06/14/2022     LONG TERM GOALS: Target date: 07/16/2022    Patient is independent with advanced  HEP for pelvic floor.  Baseline:  Goal status: Met 07/05/2022   2.  Patient is able to have a bowel movement without straining due to improved coordination of the pelvic floor muscles.  Baseline:  Goal status: partially met 07/05/2022  3.  Patient educated on how to manage rectal prolapse.  Baseline:  Goal status: Met 07/05/2022  4.  Pelvic floor strength >/= 3/5 to fully push the stool out of the rectum.  Baseline:  Goal status: Met 07/05/2022    PLAN:  PLAN FOR NEXT SESSION: discharge to [facility], PT 07/05/22 8:08 AM   PHYSICAL THERAPY DISCHARGE SUMMARY  Visits from Start of Care: 6  Current functional level related to goals / functional outcomes: See above.    Remaining deficits: See above.    Education / Equipment: HEP   Patient agrees to discharge. Patient goals were partially met. Patient is being discharged due to being pleased with the current functional level. Thank you for the referral. Earlie Counts, PT 07/05/22 8:47 AM

## 2022-07-07 ENCOUNTER — Other Ambulatory Visit (HOSPITAL_BASED_OUTPATIENT_CLINIC_OR_DEPARTMENT_OTHER): Payer: Self-pay

## 2022-07-07 ENCOUNTER — Other Ambulatory Visit (HOSPITAL_COMMUNITY): Payer: Self-pay

## 2022-07-07 ENCOUNTER — Encounter (HOSPITAL_COMMUNITY): Payer: Self-pay | Admitting: Pharmacist

## 2022-07-07 DIAGNOSIS — E782 Mixed hyperlipidemia: Secondary | ICD-10-CM | POA: Diagnosis not present

## 2022-07-07 DIAGNOSIS — Z6826 Body mass index (BMI) 26.0-26.9, adult: Secondary | ICD-10-CM | POA: Diagnosis not present

## 2022-07-07 DIAGNOSIS — F324 Major depressive disorder, single episode, in partial remission: Secondary | ICD-10-CM | POA: Diagnosis not present

## 2022-07-07 DIAGNOSIS — F5081 Binge eating disorder: Secondary | ICD-10-CM | POA: Diagnosis not present

## 2022-07-07 MED ORDER — COMIRNATY 30 MCG/0.3ML IM SUSY
PREFILLED_SYRINGE | INTRAMUSCULAR | 0 refills | Status: DC
  Filled 2022-07-07: qty 0.3, 1d supply, fill #0

## 2022-07-08 DIAGNOSIS — F411 Generalized anxiety disorder: Secondary | ICD-10-CM | POA: Diagnosis not present

## 2022-07-08 DIAGNOSIS — F332 Major depressive disorder, recurrent severe without psychotic features: Secondary | ICD-10-CM | POA: Diagnosis not present

## 2022-07-12 ENCOUNTER — Encounter: Payer: 59 | Admitting: Physical Therapy

## 2022-07-15 ENCOUNTER — Encounter (HOSPITAL_BASED_OUTPATIENT_CLINIC_OR_DEPARTMENT_OTHER): Payer: Self-pay | Admitting: Physical Therapy

## 2022-07-15 ENCOUNTER — Other Ambulatory Visit: Payer: Self-pay

## 2022-07-15 ENCOUNTER — Ambulatory Visit (HOSPITAL_BASED_OUTPATIENT_CLINIC_OR_DEPARTMENT_OTHER): Payer: 59 | Attending: Family Medicine | Admitting: Physical Therapy

## 2022-07-15 DIAGNOSIS — M7062 Trochanteric bursitis, left hip: Secondary | ICD-10-CM | POA: Diagnosis not present

## 2022-07-15 DIAGNOSIS — R262 Difficulty in walking, not elsewhere classified: Secondary | ICD-10-CM | POA: Insufficient documentation

## 2022-07-15 DIAGNOSIS — M25552 Pain in left hip: Secondary | ICD-10-CM | POA: Diagnosis not present

## 2022-07-15 NOTE — Therapy (Signed)
OUTPATIENT PHYSICAL THERAPY LOWER EXTREMITY EVALUATION   Patient Name: Tammy [name] MRN: [id number] DOB:12-Oct-1960, 61 y.o., female Today's Date: 07/15/2022   PT End of Session - 07/15/22 1507     Visit Number 1    Number of Visits 12   requires medical necessity at 25   Date for PT Re-Evaluation 08/27/22    Authorization Type [employer]    Authorization - Visit Number 22    Authorization - Number of Visits 25    PT Start Time 1300    PT Stop Time 1341    PT Time Calculation (min) 41 min    Activity Tolerance Patient tolerated treatment well    Behavior During Therapy [facility] for tasks assessed/performed             Past Medical History:  Diagnosis Date   Arthritis    Chronic fatigue syndrome    Diverticulosis    External hemorrhoids    Hypertension    Internal hemorrhoids    Neuropathy    Spinal stenosis    Past Surgical History:  Procedure Laterality Date   CARDIAC CATHETERIZATION  09/18/2012   LEFT HEART CATHETERIZATION WITH CORONARY ANGIOGRAM N/A 09/18/2012   Procedure: LEFT HEART CATHETERIZATION WITH CORONARY ANGIOGRAM;  Surgeon: Sherren Mocha, MD;  Location: [facility] CATH LAB;  Service: Cardiovascular;  Laterality: N/A;   TONSILLECTOMY  1965   Patient Active Problem List   Diagnosis Date Noted   Arthritis of left hip 06/25/2022   Hip flexor tendon tightness, left 04/02/2022   Greater trochanteric bursitis of left hip 01/11/2022   Anxiety 07/24/2020   Nonallopathic lesion of cervical region 04/03/2020   Nonallopathic lesion of thoracic region 04/03/2020   Nonallopathic lesion of lumbar region 04/03/2020   Cervical stenosis of spine 03/31/2020   Attention deficit disorder (ADD) in adult 03/31/2020   Low bone density 03/17/2020   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[place]) 02/15/2020   GERD (gastroesophageal reflux disease) 02/15/2020   Allergic rhinitis 03/09/2016   Essential hypertension 02/18/2014   Chronic pain syndrome 02/22/2013   Chronic  insomnia 02/22/2013   Paresthesias 10/25/2012   Hyperlipidemia 09/18/2012     REFERRING PROVIDER: [name], DO  REFERRING DIAG: M70.62 (ICD-10-CM) - Greater trochanteric bursitis of left hip  THERAPY DIAG:  Pain in left hip  Difficulty in walking, not elsewhere classified  Rationale for Evaluation and Treatment Rehabilitation  ONSET DATE: subacute on chronic  SUBJECTIVE:   SUBJECTIVE STATEMENT: To the point where it disrupts my sleep. Was doing elliptical and strength training 2/week. Got too bad and stopped for a few weeks. I have not stopped swimming 3/week. Leg press hurt specifically.  Pain alont Lt ant thigh in hip flexion.   PERTINENT HISTORY: LBP, low bone density PAIN:  Are you having pain? Yes: NPRS scale: 5/10 Pain location: Lt hip Pain description: sharp, achey Aggravating factors: sitting, laying Relieving factors: standing  PRECAUTIONS: None  WEIGHT BEARING RESTRICTIONS: No  FALLS:  Has patient fallen in last 6 months? No   OCCUPATION: psychologist  PLOF: Independent  PATIENT GOALS: decrease pain   OBJECTIVE:   DIAGNOSTIC FINDINGS:  MRI pelvis 9/0/23 IMPRESSION: 1. High-grade partial-thickness cartilage loss with areas of full-thickness cartilage loss of the left femoral head and acetabulum with mild subchondral reactive marrow edema in the superior anterior acetabulum consistent with moderate-severe osteoarthritis. 2. Partial-thickness cartilage loss of the right femoral head and acetabulum. 3. Right anterior labral tear. Left anterior labral tear with a 10 mm paralabral  cyst. 4. Broad-based disc bulge at L4-5 with a central annular fissure. Mild left and moderate right facet arthropathy at L4-5.  PATIENT SURVEYS:  FOTO not completed-prehab  COGNITION: Overall cognitive status: Within functional limits for tasks assessed     SENSATION: Tingling and nerve pain going down leg.     PALPATION: Tightness in left hip  flexors, reduced spring in post rotation of Rt innom.   LOWER EXTREMITY ROM:  PROM WFL but painful, empty end feel  LOWER EXTREMITY MMT:  MMT Right eval Left eval  Hip flexion    Hip extension    Hip abduction    Hip adduction    Hip internal rotation    Hip external rotation    Knee flexion    Knee extension    Ankle dorsiflexion    Ankle plantarflexion    Ankle inversion    Ankle eversion     (Blank rows = not tested)  GAIT: Distance walked: [facility] without AD    TODAY'S TREATMENT                                                                          Treatment                            EVAL 07/15/22:  MANUAL: hooklying FA joint distraction, IASTM Lt quads- focus on VL & VMO, STM hip flexors on Lt Seated HS & adductor stretches Standing gastroc stretches Discussed elliptical vs bike Standing hip distraction on step Pool- hip circles, quad stretch with noodle, squats   PATIENT EDUCATION:  Education details: Geophysicist/field seismologist of condition, POC, HEP, exercise form/rationale [name] educated: Patient Education method: Explanation, Demonstration, Tactile cues, Verbal cues, and Handouts Education comprehension: verbalized understanding, returned demonstration, verbal cues required, tactile cues required, and needs further education  HOME EXERCISE PROGRAM: [id number] Hip traction by standing on left foot on elevated surface  ASSESSMENT:  CLINICAL IMPRESSION: Patient is a 61 y.o. F who was seen today for physical therapy evaluation and treatment for subacute on chronic, worsening left hip pain. MRI reveals significant degeneration and is meeting with surgeon on Monday after which we will further determine POC. Improved tolerance to passive hip flx following manual therapy.    OBJECTIVE IMPAIRMENTS: decreased activity tolerance, decreased ROM, increased muscle spasms, improper body mechanics, and pain.   ACTIVITY LIMITATIONS: carrying, lifting, bending, sitting, squatting,  sleeping, stairs, and locomotion level  PARTICIPATION LIMITATIONS: meal prep, cleaning, laundry, driving, shopping, community activity, and occupation  PERSONAL FACTORS: 1-2 comorbidities: chronic back pain, known degeneration in hip, low bone density  are also affecting patient's functional outcome.   REHAB POTENTIAL: Fair significant degeneration  CLINICAL DECISION MAKING: Evolving/moderate complexity  EVALUATION COMPLEXITY: Moderate   GOALS: Goals reviewed with patient? Yes  SHORT TERM GOALS: Target date: 07/29/2022  Determine need to change POC following surgical visit Baseline: Goal status: INITIAL    LONG TERM GOALS: Target date: 08/27/22  Able to sleep without interruption by hip pain Baseline:  Goal status: INITIAL  2.  Able to sit for at least 20 min without increased hip pain Baseline:  Goal status: INITIAL  3.  Hip abd strength 90% of opp  LE Baseline:  Goal status: INITIAL  4.  Able to navigate stairs with minimal pain Baseline:  Goal status: INITIAL   PLAN:  PT FREQUENCY: 1-2x/week  PT DURATION: 6 weeks  PLANNED INTERVENTIONS: Therapeutic exercises, Therapeutic activity, Neuromuscular re-education, Balance training, Gait training, Patient/Family education, Self Care, Joint mobilization, Stair training, Aquatic Therapy, Dry Needling, Electrical stimulation, Spinal mobilization, Cryotherapy, Moist heat, Taping, Ionotophoresis '4mg'$ /ml Dexamethasone, Manual therapy, and Re-evaluation  PLAN FOR NEXT SESSION: DN PRN, continue with joint distraction, outcome of surgical visit?   Mirel Hundal C. Chloeanne Poteet PT, DPT 07/15/22 3:27 PM

## 2022-07-19 ENCOUNTER — Ambulatory Visit (INDEPENDENT_AMBULATORY_CARE_PROVIDER_SITE_OTHER): Payer: 59 | Admitting: Orthopaedic Surgery

## 2022-07-19 DIAGNOSIS — M7632 Iliotibial band syndrome, left leg: Secondary | ICD-10-CM

## 2022-07-19 DIAGNOSIS — M7062 Trochanteric bursitis, left hip: Secondary | ICD-10-CM | POA: Diagnosis not present

## 2022-07-19 DIAGNOSIS — Z760 Encounter for issue of repeat prescription: Secondary | ICD-10-CM | POA: Diagnosis not present

## 2022-07-19 MED ORDER — LIDOCAINE HCL 1 % IJ SOLN
3.0000 mL | INTRAMUSCULAR | Status: AC | PRN
  Administered 2022-07-19: 3 mL

## 2022-07-19 MED ORDER — METHYLPREDNISOLONE ACETATE 40 MG/ML IJ SUSP
40.0000 mg | INTRAMUSCULAR | Status: AC | PRN
  Administered 2022-07-19: 40 mg via INTRA_ARTICULAR

## 2022-07-19 NOTE — Progress Notes (Signed)
Office Visit Note   Patient: Tammy [name]           Date of Birth: 02-23-1961           MRN: [id number] Visit Date: 07/19/2022              Requested by: [name], DO [place],  [place] [id number] PCP: [name], MD   Assessment & Plan: Visit Diagnoses:  1. Trochanteric bursitis, left hip   2. It band syndrome, left     Plan: I did recommend an injection by me over both the trochanteric area and her IT band today.  I would like to place 2 separate injections since she is not a diabetic.  She agreed to this and tolerated them well.  I want her to completely stop all athletic activities and exercising for the next 3 weeks.  This includes swimming and elliptical.  I would like to reexamine her in 3 weeks now to see what response she has had to the injections as well as see what her exam is like.  I have let her know that there are plenty times where I have had a patient that I have seen the MRI report and taken to the OR for hip replacement and found minimal arthritic changes.  I think we need to proceed slowly with her and she agrees with this as well.  All question concerns were answered and addressed.  We will reevaluate in 3 weeks.  Follow-Up Instructions: Return in about 3 weeks (around 08/09/2022).   Orders:  Orders Placed This Encounter  Procedures   Large Joint Inj   No orders of the defined types were placed in this encounter.     Procedures: Large Joint Inj: L greater trochanter on 07/19/2022 1:35 PM Indications: pain and diagnostic evaluation Details: 22 G 1.5 in needle, lateral approach  Arthrogram: No  Medications: 3 mL lidocaine 1 %; 40 mg methylPREDNISolone acetate 40 MG/ML Outcome: tolerated well, no immediate complications Procedure, treatment alternatives, risks and benefits explained, specific risks discussed. Consent was given by the patient. Immediately prior to procedure a time out was called to verify the correct patient,  procedure, equipment, support staff and site/side marked as required. Patient was prepped and draped in the usual sterile fashion.       Clinical Data: No additional findings.   Subjective: Chief Complaint  Patient presents with   Left Hip - Pain  The patient is a very pleasant and active 61 year old female sent from Dr. Hulan Saas to evaluate and treat her left hip.  She has had a recent MRI of her left hip that the radiologist read as significant arthritic changes.  She is very athletic.  She does exercise every day of the week with 3 days where she swims for over an hour at a time and she does elliptical regularly as well.  She has not rested one bit in terms of stopping her activities.  She has been through aggressive physical therapy and has had at least 1 trochanteric injection of a steroid.  She denies any groin pain at all.  She has had MRI of her lumbar spine as well as her pelvis and plain films of the pelvis.  She says it does hurt when she lays on that side and it hurts when she sleeps.  She points to the IT band from her trochanteric area down to her knee as a source of her pain.  She  does not walk with a limp.  HPI  Review of Systems There is currently no fever, chills, nausea, vomiting  Objective: Vital Signs: LMP 11/05/2007   Physical Exam She is alert and orient x3 and in no acute distress Ortho Exam Examination of both hips shows full and fluid range of motion with no blocks to rotation of either hip and no pain in the groin when I stressed the left hip or compress the left hip.  There is no pain in the hip on the extremes of rotation.  However when I have her lay on her side she has significant pain over the trochanteric area and a lot of pain at the mid and lower IT band all along the lateral aspect of her thigh. Specialty Comments:  No specialty comments available.  Imaging: No results found. I did review the plain films as well as the MRI of her pelvis.  The  left hip on my read has minimal arthritic changes.  I see a small amount of edema in the femoral head and acetabulum but this is very small.  There is no effusion.  I do not see the full-thickness cartilage loss of the weightbearing surface of the left hip at all.  Her plain films also show no significant arthritic changes.  I also reviewed the MRI of her lumbar spine.  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[place]) 02/15/2020   GERD (gastroesophageal reflux disease) 02/15/2020   Allergic rhinitis 03/09/2016   Essential hypertension 02/18/2014   Chronic pain syndrome 02/22/2013   Chronic insomnia 02/22/2013   Paresthesias 10/25/2012   Hyperlipidemia 09/18/2012   Past Medical History:  Diagnosis Date   Arthritis    Chronic fatigue syndrome    Diverticulosis    External hemorrhoids    Hypertension    Internal hemorrhoids    Neuropathy    Spinal stenosis     Family History  Problem Relation Age of Onset   Heart attack Maternal Grandmother 60   Arthritis Maternal Grandmother    Hyperlipidemia Maternal Grandmother    Heart disease Maternal Grandmother    Diabetes Maternal Grandmother    Pancreatic cancer Mother        died 52. 2.5 years past diagnosis.     Anxiety disorder Mother    Depression Mother    Colon polyps Father    Healthy Sister    Healthy Brother    Depression Brother    Heart attack Brother        age 61   Suicidality Maternal Grandfather    Colon  cancer Neg Hx    Esophageal cancer Neg Hx    Kidney disease Neg Hx     Past Surgical History:  Procedure Laterality Date   CARDIAC CATHETERIZATION  09/18/2012   LEFT HEART CATHETERIZATION WITH CORONARY ANGIOGRAM N/A 09/18/2012   Procedure: LEFT HEART CATHETERIZATION WITH CORONARY ANGIOGRAM;  Surgeon: Sherren Mocha, MD;  Location: [facility] CATH LAB;  Service: Cardiovascular;  Laterality: N/A;   TONSILLECTOMY  1965   Social History   Occupational History   Occupation: Diplomatic Services operational officer: [employer]  Tobacco Use   Smoking status: Never   Smokeless tobacco: Never  Substance and Sexual Activity   Alcohol use: No   Drug use: No   Sexual activity: Yes

## 2022-07-20 ENCOUNTER — Ambulatory Visit (HOSPITAL_BASED_OUTPATIENT_CLINIC_OR_DEPARTMENT_OTHER): Payer: 59 | Admitting: Physical Therapy

## 2022-07-20 ENCOUNTER — Encounter (HOSPITAL_BASED_OUTPATIENT_CLINIC_OR_DEPARTMENT_OTHER): Payer: Self-pay | Admitting: Physical Therapy

## 2022-07-20 DIAGNOSIS — M7062 Trochanteric bursitis, left hip: Secondary | ICD-10-CM | POA: Diagnosis not present

## 2022-07-20 DIAGNOSIS — M25552 Pain in left hip: Secondary | ICD-10-CM | POA: Diagnosis not present

## 2022-07-20 DIAGNOSIS — R262 Difficulty in walking, not elsewhere classified: Secondary | ICD-10-CM

## 2022-07-20 NOTE — Therapy (Signed)
OUTPATIENT PHYSICAL THERAPY LOWER EXTREMITY EVALUATION   Patient Name: Tammy [name] MRN: [id number] DOB:Nov 19, 1960, 61 y.o., female Today's Date: 07/20/2022   PT End of Session - 07/20/22 1300     Visit Number 2    Number of Visits 12   requires medical necessity at 25   Date for PT Re-Evaluation 08/27/22    Authorization Type [employer]    Authorization - Visit Number 22    Authorization - Number of Visits 25    PT Start Time 0086    PT Stop Time 1300    PT Time Calculation (min) 30 min    Activity Tolerance Patient tolerated treatment well    Behavior During Therapy [facility] for tasks assessed/performed              Past Medical History:  Diagnosis Date   Arthritis    Chronic fatigue syndrome    Diverticulosis    External hemorrhoids    Hypertension    Internal hemorrhoids    Neuropathy    Spinal stenosis    Past Surgical History:  Procedure Laterality Date   CARDIAC CATHETERIZATION  09/18/2012   LEFT HEART CATHETERIZATION WITH CORONARY ANGIOGRAM N/A 09/18/2012   Procedure: LEFT HEART CATHETERIZATION WITH CORONARY ANGIOGRAM;  Surgeon: Sherren Mocha, MD;  Location: [facility] CATH LAB;  Service: Cardiovascular;  Laterality: N/A;   TONSILLECTOMY  1965   Patient Active Problem List   Diagnosis Date Noted   Arthritis of left hip 06/25/2022   Hip flexor tendon tightness, left 04/02/2022   Greater trochanteric bursitis of left hip 01/11/2022   Anxiety 07/24/2020   Nonallopathic lesion of cervical region 04/03/2020   Nonallopathic lesion of thoracic region 04/03/2020   Nonallopathic lesion of lumbar region 04/03/2020   Cervical stenosis of spine 03/31/2020   Attention deficit disorder (ADD) in adult 03/31/2020   Low bone density 03/17/2020   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[place]) 02/15/2020   GERD (gastroesophageal reflux disease) 02/15/2020   Allergic rhinitis 03/09/2016   Essential hypertension 02/18/2014   Chronic pain syndrome 02/22/2013   Chronic  insomnia 02/22/2013   Paresthesias 10/25/2012   Hyperlipidemia 09/18/2012     REFERRING PROVIDER: [name], DO  REFERRING DIAG: M70.62 (ICD-10-CM) - Greater trochanteric bursitis of left hip  THERAPY DIAG:  Pain in left hip  Difficulty in walking, not elsewhere classified  Rationale for Evaluation and Treatment Rehabilitation  ONSET DATE: subacute on chronic  SUBJECTIVE:   SUBJECTIVE STATEMENT: Two injections into hip and ITB.Dr Ninfa Linden wants me to stop all athletic activities for 3 weeks.   PERTINENT HISTORY: LBP, low bone density PAIN:  Are you having pain? Yes: NPRS scale: 5/10 Pain location: Lt hip Pain description: sharp, achey Aggravating factors: sitting, laying Relieving factors: standing  PRECAUTIONS: None  WEIGHT BEARING RESTRICTIONS: No  FALLS:  Has patient fallen in last 6 months? No   OCCUPATION: psychologist  PLOF: Independent  PATIENT GOALS: decrease pain   OBJECTIVE:   DIAGNOSTIC FINDINGS:  MRI pelvis 9/0/23 IMPRESSION: 1. High-grade partial-thickness cartilage loss with areas of full-thickness cartilage loss of the left femoral head and acetabulum with mild subchondral reactive marrow edema in the superior anterior acetabulum consistent with moderate-severe osteoarthritis. 2. Partial-thickness cartilage loss of the right femoral head and acetabulum. 3. Right anterior labral tear. Left anterior labral tear with a 10 mm paralabral cyst. 4. Broad-based disc bulge at L4-5 with a central annular fissure. Mild left and moderate right facet arthropathy at L4-5.  PATIENT SURVEYS:  FOTO not completed-prehab  COGNITION: Overall cognitive status: Within functional limits for tasks assessed     SENSATION: Tingling and nerve pain going down leg.     PALPATION: Tightness in left hip flexors, reduced spring in post rotation of Rt innom.   LOWER EXTREMITY ROM:  PROM WFL but painful, empty end feel  LOWER EXTREMITY  MMT:  MMT Right eval Left eval  Hip flexion    Hip extension    Hip abduction    Hip adduction    Hip internal rotation    Hip external rotation    Knee flexion    Knee extension    Ankle dorsiflexion    Ankle plantarflexion    Ankle inversion    Ankle eversion     (Blank rows = not tested)  GAIT: Distance walked: [facility] without AD    TODAY'S TREATMENT                                                                           Treatment                            07/20/22:  Stretches: HS, gastrocs, piriformis, quads, adductors   Treatment                            EVAL 07/15/22:  MANUAL: hooklying FA joint distraction, IASTM Lt quads- focus on VL & VMO, STM hip flexors on Lt Seated HS & adductor stretches Standing gastroc stretches Discussed elliptical vs bike Standing hip distraction on step Pool- hip circles, quad stretch with noodle, squats   PATIENT EDUCATION:  Education details: Geophysicist/field seismologist of condition, POC, HEP, exercise form/rationale Person educated: Patient Education method: Consulting civil engineer, Demonstration, Tactile cues, Verbal cues, and Handouts Education comprehension: verbalized understanding, returned demonstration, verbal cues required, tactile cues required, and needs further education  HOME EXERCISE PROGRAM: [id number] Hip traction by standing on left foot on elevated surface  ASSESSMENT:  CLINICAL IMPRESSION: Pt received injections at Lt femoral bursa and distal in ITband and MD has requested that she avoid all athletic activity for 3 weeks. We discussed functional mobility vs athletic strength training. We will also put PT on hold during this rest period to allow her to work on stretching and then progressively return her to the proper gym program for long term fitness.    OBJECTIVE IMPAIRMENTS: decreased activity tolerance, decreased ROM, increased muscle spasms, improper body mechanics, and pain.   ACTIVITY LIMITATIONS: carrying, lifting, bending,  sitting, squatting, sleeping, stairs, and locomotion level  PARTICIPATION LIMITATIONS: meal prep, cleaning, laundry, driving, shopping, community activity, and occupation  PERSONAL FACTORS: 1-2 comorbidities: chronic back pain, known degeneration in hip, low bone density  are also affecting patient's functional outcome.   REHAB POTENTIAL: Fair significant degeneration  CLINICAL DECISION MAKING: Evolving/moderate complexity  EVALUATION COMPLEXITY: Moderate   GOALS: Goals reviewed with patient? Yes  SHORT TERM GOALS: Target date: 08/03/2022  Determine need to change POC following surgical visit Baseline: Goal status: achieved    LONG TERM GOALS: Target date: 08/27/22  Able to sleep without interruption by hip pain Baseline:  Goal status: INITIAL  2.  Able to sit for at least 20 min without increased hip pain Baseline:  Goal status: INITIAL  3.  Hip abd strength 90% of opp LE Baseline:  Goal status: INITIAL  4.  Able to navigate stairs with minimal pain Baseline:  Goal status: INITIAL   PLAN:  PT FREQUENCY: 1-2x/week  PT DURATION: 6 weeks  PLANNED INTERVENTIONS: Therapeutic exercises, Therapeutic activity, Neuromuscular re-education, Balance training, Gait training, Patient/Family education, Self Care, Joint mobilization, Stair training, Aquatic Therapy, Dry Needling, Electrical stimulation, Spinal mobilization, Cryotherapy, Moist heat, Taping, Ionotophoresis '4mg'$ /ml Dexamethasone, Manual therapy, and Re-evaluation  PLAN FOR NEXT SESSION: DN PRN, gradual return to gym program- watch form   Torell Minder C. Alisi Lupien PT, DPT 07/20/22 2:16 PM

## 2022-07-21 ENCOUNTER — Other Ambulatory Visit (HOSPITAL_COMMUNITY): Payer: Self-pay

## 2022-07-21 DIAGNOSIS — Z8639 Personal history of other endocrine, nutritional and metabolic disease: Secondary | ICD-10-CM | POA: Diagnosis not present

## 2022-07-21 DIAGNOSIS — Z6826 Body mass index (BMI) 26.0-26.9, adult: Secondary | ICD-10-CM | POA: Diagnosis not present

## 2022-07-21 DIAGNOSIS — M25552 Pain in left hip: Secondary | ICD-10-CM | POA: Diagnosis not present

## 2022-07-21 DIAGNOSIS — E663 Overweight: Secondary | ICD-10-CM | POA: Diagnosis not present

## 2022-07-21 DIAGNOSIS — R632 Polyphagia: Secondary | ICD-10-CM | POA: Diagnosis not present

## 2022-07-21 MED ORDER — WEGOVY 1 MG/0.5ML ~~LOC~~ SOAJ
1.0000 mg | SUBCUTANEOUS | 3 refills | Status: DC
Start: 2022-07-21 — End: 2023-02-22
  Filled 2022-07-21: qty 2, 28d supply, fill #0
  Filled 2022-08-23: qty 2, 28d supply, fill #1

## 2022-07-27 ENCOUNTER — Encounter (HOSPITAL_BASED_OUTPATIENT_CLINIC_OR_DEPARTMENT_OTHER): Payer: 59 | Admitting: Physical Therapy

## 2022-07-28 ENCOUNTER — Other Ambulatory Visit (HOSPITAL_COMMUNITY): Payer: Self-pay

## 2022-08-02 ENCOUNTER — Other Ambulatory Visit (HOSPITAL_COMMUNITY): Payer: Self-pay

## 2022-08-02 ENCOUNTER — Encounter (HOSPITAL_BASED_OUTPATIENT_CLINIC_OR_DEPARTMENT_OTHER): Payer: 59 | Admitting: Physical Therapy

## 2022-08-03 ENCOUNTER — Other Ambulatory Visit (HOSPITAL_COMMUNITY): Payer: Self-pay

## 2022-08-04 ENCOUNTER — Other Ambulatory Visit (HOSPITAL_COMMUNITY): Payer: Self-pay

## 2022-08-04 DIAGNOSIS — E663 Overweight: Secondary | ICD-10-CM | POA: Diagnosis not present

## 2022-08-04 DIAGNOSIS — Z6825 Body mass index (BMI) 25.0-25.9, adult: Secondary | ICD-10-CM | POA: Diagnosis not present

## 2022-08-04 DIAGNOSIS — R632 Polyphagia: Secondary | ICD-10-CM | POA: Diagnosis not present

## 2022-08-04 DIAGNOSIS — Z8639 Personal history of other endocrine, nutritional and metabolic disease: Secondary | ICD-10-CM | POA: Diagnosis not present

## 2022-08-04 DIAGNOSIS — M5432 Sciatica, left side: Secondary | ICD-10-CM | POA: Diagnosis not present

## 2022-08-05 DIAGNOSIS — F332 Major depressive disorder, recurrent severe without psychotic features: Secondary | ICD-10-CM | POA: Diagnosis not present

## 2022-08-05 DIAGNOSIS — F411 Generalized anxiety disorder: Secondary | ICD-10-CM | POA: Diagnosis not present

## 2022-08-09 ENCOUNTER — Encounter: Payer: Self-pay | Admitting: Orthopaedic Surgery

## 2022-08-09 ENCOUNTER — Ambulatory Visit (INDEPENDENT_AMBULATORY_CARE_PROVIDER_SITE_OTHER): Payer: 59 | Admitting: Orthopaedic Surgery

## 2022-08-09 DIAGNOSIS — M25552 Pain in left hip: Secondary | ICD-10-CM

## 2022-08-09 DIAGNOSIS — M7632 Iliotibial band syndrome, left leg: Secondary | ICD-10-CM | POA: Diagnosis not present

## 2022-08-09 DIAGNOSIS — M7062 Trochanteric bursitis, left hip: Secondary | ICD-10-CM

## 2022-08-09 NOTE — Progress Notes (Signed)
The patient comes in today for continued follow-up as it relates to her left hip and IT band symptoms combined with trochanteric bursitis and sciatica.  She is also dealt with some piriformis symptoms.  She is an avid Academic librarian.  She works out most days a week.  Between swimming and elliptical and other exercises.  She is a very physically fit 61 year old female.  She was originally sent to me due to MRI of her hip showing moderate arthritis of that left hip.  However she continues to deny any groin pain.  At her last visit I did provide 2 steroid injections around the proximal IT band and the distal IT band.  She said she rested from sports and this helped greatly for about a week but then things seem to get worse over those areas.  It does sometimes radiate down to her leg.  She has had an MRI of her lumbar spine that showed some mild arthritic changes but no significant nerve compression.  On exam today her left hip still moves smoothly and fluidly with no blocks to rotation and no pain in the groin at all on the left side.  She still has pain around the sciatic area in the piriformis area on the left side and the proximal and distal IT bands.  I would like to send her to [facility] for more specific physical therapy as it relates to her left hip area.  I would like him to try any modalities including dry needling and e-stim as well as iontophoresis and phonophoresis.  Any modalities that the therapist feel could help calm the symptoms down.  I am fine with her getting back to her swimming routine.  I would like to see her back in 2 months after course of therapy.

## 2022-08-09 NOTE — Therapy (Signed)
OUTPATIENT PHYSICAL THERAPY LOWER EXTREMITY EVALUATION   Patient Name: Tammy [name] MRN: [id number] DOB:[birth year], 61 y.o., female Today's Date: 08/10/2022   PT End of Session - 08/10/22 1304     Visit Number 3    Number of Visits 12   requires medical necessity at 25   Date for PT Re-Evaluation 08/27/22    Authorization Type     Authorization - Number of Visits 25    PT Start Time 1300    PT Stop Time 1344    PT Time Calculation (min) 44 min    Activity Tolerance Patient tolerated treatment well    Behavior During Therapy [facility] for tasks assessed/performed               Past Medical History:  Diagnosis Date   Arthritis    Chronic fatigue syndrome    Diverticulosis    External hemorrhoids    Hypertension    Internal hemorrhoids    Neuropathy    Spinal stenosis    Past Surgical History:  Procedure Laterality Date   CARDIAC CATHETERIZATION  09/18/2012   LEFT HEART CATHETERIZATION WITH CORONARY ANGIOGRAM N/A 09/18/2012   Procedure: LEFT HEART CATHETERIZATION WITH CORONARY ANGIOGRAM;  Surgeon: Sherren Mocha, MD;  Location: [facility] CATH LAB;  Service: Cardiovascular;  Laterality: N/A;   TONSILLECTOMY  1965   Patient Active Problem List   Diagnosis Date Noted   Arthritis of left hip 06/25/2022   Hip flexor tendon tightness, left 04/02/2022   Greater trochanteric bursitis of left hip 01/11/2022   Anxiety 07/24/2020   Nonallopathic lesion of cervical region 04/03/2020   Nonallopathic lesion of thoracic region 04/03/2020   Nonallopathic lesion of lumbar region 04/03/2020   Cervical stenosis of spine 03/31/2020   Attention deficit disorder (ADD) in adult 03/31/2020   Low bone density 03/17/2020   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[place]) 02/15/2020   GERD (gastroesophageal reflux disease) 02/15/2020   Allergic rhinitis 03/09/2016   Essential hypertension 02/18/2014   Chronic pain syndrome 02/22/2013   Chronic insomnia 02/22/2013   Paresthesias  10/25/2012   Hyperlipidemia 09/18/2012     REFERRING PROVIDER: [name], DO  REFERRING DIAG: M70.62 (ICD-10-CM) - Greater trochanteric bursitis of left hip  THERAPY DIAG:  Pain in left hip  Difficulty in walking, not elsewhere classified  Muscle weakness (generalized)  Rationale for Evaluation and Treatment Rehabilitation  ONSET DATE: subacute on chronic  SUBJECTIVE:   SUBJECTIVE STATEMENT: Felt okay until about 1.5 week in and now it hurts worse than ever. It ifeels like it catches when lifting leg and hurts to step up a step.   PERTINENT HISTORY: LBP, low bone density PAIN:  Are you having pain? Yes: NPRS scale: 3 at rest, lifting is 6/10 Pain location: Lt leg Pain description: sharp, achey Aggravating factors: sitting, going up stairs Relieving factors: standing  PRECAUTIONS: None  WEIGHT BEARING RESTRICTIONS: No  FALLS:  Has patient fallen in last 6 months? No   OCCUPATION: psychologist  PLOF: Independent  PATIENT GOALS: decrease pain   OBJECTIVE:   DIAGNOSTIC FINDINGS:  MRI pelvis 9/0/23 IMPRESSION: 1. High-grade partial-thickness cartilage loss with areas of full-thickness cartilage loss of the left femoral head and acetabulum with mild subchondral reactive marrow edema in the superior anterior acetabulum consistent with moderate-severe osteoarthritis. 2. Partial-thickness cartilage loss of the right femoral head and acetabulum. 3. Right anterior labral tear. Left anterior labral tear with a 10 mm paralabral cyst. 4. Broad-based disc bulge at L4-5 with a  central annular fissure. Mild left and moderate right facet arthropathy at L4-5.  PATIENT SURVEYS:  FOTO not completed-prehab  COGNITION: Overall cognitive status: Within functional limits for tasks assessed     SENSATION: Tingling and nerve pain going down leg.     PALPATION: Tightness in left hip flexors, reduced spring in post rotation of Rt innom.   LOWER EXTREMITY  ROM:  PROM WFL but painful, empty end feel  LOWER EXTREMITY MMT:  MMT Right eval Left eval  Hip flexion    Hip extension    Hip abduction    Hip adduction    Hip internal rotation    Hip external rotation    Knee flexion    Knee extension    Ankle dorsiflexion    Ankle plantarflexion    Ankle inversion    Ankle eversion     (Blank rows = not tested)  GAIT: Distance walked: [facility] without AD    TODAY'S TREATMENT                                                                           Treatment                            08/10/22:  Trigger Point Dry Needling, Manual Therapy Treatment:  Initial or subsequent education regarding Trigger Point Dry Needling: Initial Did patient give consent to treatment with Trigger Point Dry Needling: Yes TPDN with skilled palpation and monitoring followed by STM to the following muscles: Lt piriformis, glut med/min, TFL  Ultrasound to rt glut med/min and piriformis- 8 min 100% 1.0 w/cm2   Treatment                            07/20/22:  Stretches: HS, gastrocs, piriformis, quads, adductors   Treatment                            EVAL 07/15/22:  MANUAL: hooklying FA joint distraction, IASTM Lt quads- focus on VL & VMO, STM hip flexors on Lt Seated HS & adductor stretches Standing gastroc stretches Discussed elliptical vs bike Standing hip distraction on step Pool- hip circles, quad stretch with noodle, squats   PATIENT EDUCATION:  Education details: Geophysicist/field seismologist of condition, POC, HEP, exercise form/rationale Person educated: Patient Education method: Consulting civil engineer, Demonstration, Tactile cues, Verbal cues, and Handouts Education comprehension: verbalized understanding, returned demonstration, verbal cues required, tactile cues required, and needs further education  HOME EXERCISE PROGRAM: [id number] Hip traction by standing on left foot on elevated surface  ASSESSMENT:  CLINICAL IMPRESSION: Time taken at the beginning of the session to  discuss symptoms and progress over the last 3 weeks.  Significant twitch response noted from gluten need upon dry needling and patient reported concordant symptoms with addressing gluten medial versus piriformis.  There was a twitch response in piriformis but patient denied concordant symptoms.  She is following up with MD in 2 months so we will work to control the symptoms of the musculoskeletal system around the hip joint.  MRI report does note bilateral hip labrum with tear but patient does  not complain of groin pain.  Will monitor symptoms as we make changes and encouraged her to focus on gentle mobility with frequent movement and stretching.  OBJECTIVE IMPAIRMENTS: decreased activity tolerance, decreased ROM, increased muscle spasms, improper body mechanics, and pain.   ACTIVITY LIMITATIONS: carrying, lifting, bending, sitting, squatting, sleeping, stairs, and locomotion level  PARTICIPATION LIMITATIONS: meal prep, cleaning, laundry, driving, shopping, community activity, and occupation  PERSONAL FACTORS: 1-2 comorbidities: chronic back pain, known degeneration in hip, low bone density  are also affecting patient's functional outcome.   GOALS: Goals reviewed with patient? Yes  SHORT TERM GOALS: Target date: 08/23/2022  Determine need to change POC following surgical visit Baseline: Goal status: achieved    LONG TERM GOALS: Target date: POC date  Able to sleep without interruption by hip pain Baseline:  Goal status: INITIAL  2.  Able to sit for at least 20 min without increased hip pain Baseline:  Goal status: INITIAL  3.  Hip abd strength 90% of opp LE Baseline:  Goal status: INITIAL  4.  Able to navigate stairs with minimal pain Baseline:  Goal status: INITIAL   PLAN:  PT FREQUENCY: 1-2x/week  PT DURATION:8 weeks  PLANNED INTERVENTIONS: Therapeutic exercises, Therapeutic activity, Neuromuscular re-education, Balance training, Gait training, Patient/Family education,  Self Care, Joint mobilization, Stair training, Aquatic Therapy, Dry Needling, Electrical stimulation, Spinal mobilization, Cryotherapy, Moist heat, Taping, Ionotophoresis '4mg'$ /ml Dexamethasone, Manual therapy, and Re-evaluation  PLAN FOR NEXT SESSION: DN PRN, outcome of [place]? Consider ionto.   Alajia Schmelzer C. Mattingly Fountaine PT, DPT 08/10/22 2:57 PM

## 2022-08-10 ENCOUNTER — Ambulatory Visit (HOSPITAL_BASED_OUTPATIENT_CLINIC_OR_DEPARTMENT_OTHER): Payer: 59 | Attending: Family Medicine | Admitting: Physical Therapy

## 2022-08-10 ENCOUNTER — Other Ambulatory Visit: Payer: Self-pay

## 2022-08-10 ENCOUNTER — Encounter (HOSPITAL_BASED_OUTPATIENT_CLINIC_OR_DEPARTMENT_OTHER): Payer: Self-pay | Admitting: Physical Therapy

## 2022-08-10 DIAGNOSIS — M25552 Pain in left hip: Secondary | ICD-10-CM | POA: Insufficient documentation

## 2022-08-10 DIAGNOSIS — M6281 Muscle weakness (generalized): Secondary | ICD-10-CM | POA: Diagnosis not present

## 2022-08-10 DIAGNOSIS — R262 Difficulty in walking, not elsewhere classified: Secondary | ICD-10-CM | POA: Diagnosis not present

## 2022-08-10 DIAGNOSIS — M7632 Iliotibial band syndrome, left leg: Secondary | ICD-10-CM

## 2022-08-10 DIAGNOSIS — M7062 Trochanteric bursitis, left hip: Secondary | ICD-10-CM

## 2022-08-18 DIAGNOSIS — Z6826 Body mass index (BMI) 26.0-26.9, adult: Secondary | ICD-10-CM | POA: Diagnosis not present

## 2022-08-18 DIAGNOSIS — R632 Polyphagia: Secondary | ICD-10-CM | POA: Diagnosis not present

## 2022-08-18 DIAGNOSIS — Z8639 Personal history of other endocrine, nutritional and metabolic disease: Secondary | ICD-10-CM | POA: Diagnosis not present

## 2022-08-18 DIAGNOSIS — F5081 Binge eating disorder: Secondary | ICD-10-CM | POA: Diagnosis not present

## 2022-08-18 DIAGNOSIS — E663 Overweight: Secondary | ICD-10-CM | POA: Diagnosis not present

## 2022-08-18 DIAGNOSIS — M25552 Pain in left hip: Secondary | ICD-10-CM | POA: Diagnosis not present

## 2022-08-19 DIAGNOSIS — F411 Generalized anxiety disorder: Secondary | ICD-10-CM | POA: Diagnosis not present

## 2022-08-19 DIAGNOSIS — F332 Major depressive disorder, recurrent severe without psychotic features: Secondary | ICD-10-CM | POA: Diagnosis not present

## 2022-08-19 NOTE — Therapy (Signed)
OUTPATIENT PHYSICAL THERAPY LOWER EXTREMITY TREATMENT   Patient Name: Tammy [name] MRN: [id number] DOB:1960/10/17, 61 y.o., female Today's Date: 08/20/2022   PT End of Session - 08/20/22 0832     Visit Number 4    Number of Visits 20    Date for PT Re-Evaluation 10/08/22    Authorization Type     Authorization - Number of Visits 28    PT Start Time 0800    PT Stop Time 0830    PT Time Calculation (min) 30 min    Activity Tolerance Patient tolerated treatment well    Behavior During Therapy [facility] for tasks assessed/performed               Past Medical History:  Diagnosis Date   Arthritis    Chronic fatigue syndrome    Diverticulosis    External hemorrhoids    Hypertension    Internal hemorrhoids    Neuropathy    Spinal stenosis    Past Surgical History:  Procedure Laterality Date   CARDIAC CATHETERIZATION  09/18/2012   LEFT HEART CATHETERIZATION WITH CORONARY ANGIOGRAM N/A 09/18/2012   Procedure: LEFT HEART CATHETERIZATION WITH CORONARY ANGIOGRAM;  Surgeon: Sherren Mocha, MD;  Location: [facility] CATH LAB;  Service: Cardiovascular;  Laterality: N/A;   TONSILLECTOMY  1965   Patient Active Problem List   Diagnosis Date Noted   Arthritis of left hip 06/25/2022   Hip flexor tendon tightness, left 04/02/2022   Greater trochanteric bursitis of left hip 01/11/2022   Anxiety 07/24/2020   Nonallopathic lesion of cervical region 04/03/2020   Nonallopathic lesion of thoracic region 04/03/2020   Nonallopathic lesion of lumbar region 04/03/2020   Cervical stenosis of spine 03/31/2020   Attention deficit disorder (ADD) in adult 03/31/2020   Low bone density 03/17/2020   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[place]) 02/15/2020   GERD (gastroesophageal reflux disease) 02/15/2020   Allergic rhinitis 03/09/2016   Essential hypertension 02/18/2014   Chronic pain syndrome 02/22/2013   Chronic insomnia 02/22/2013   Paresthesias 10/25/2012   Hyperlipidemia  09/18/2012     REFERRING PROVIDER: [name], DO  REFERRING DIAG: M70.62 (ICD-10-CM) - Greater trochanteric bursitis of left hip  THERAPY DIAG:  Trochanteric bursitis, left hip  It band syndrome, left  Rationale for Evaluation and Treatment Rehabilitation  ONSET DATE: subacute on chronic  SUBJECTIVE:   SUBJECTIVE STATEMENT: It is so bad, I have to lead with my Right leg to even go up stairs.   PERTINENT HISTORY: LBP, low bone density PAIN:  Are you having pain? Yes: NPRS scale: 3 at rest, lifting is 6/10 Pain location: Lt leg Pain description: sharp, achey Aggravating factors: sitting, going up stairs Relieving factors: standing  PRECAUTIONS: None  WEIGHT BEARING RESTRICTIONS: No  FALLS:  Has patient fallen in last 6 months? No   OCCUPATION: psychologist  PLOF: Independent  PATIENT GOALS: decrease pain   OBJECTIVE:   DIAGNOSTIC FINDINGS:  MRI pelvis 9/0/23 IMPRESSION: 1. High-grade partial-thickness cartilage loss with areas of full-thickness cartilage loss of the left femoral head and acetabulum with mild subchondral reactive marrow edema in the superior anterior acetabulum consistent with moderate-severe osteoarthritis. 2. Partial-thickness cartilage loss of the right femoral head and acetabulum. 3. Right anterior labral tear. Left anterior labral tear with a 10 mm paralabral cyst. 4. Broad-based disc bulge at L4-5 with a central annular fissure. Mild left and moderate right facet arthropathy at L4-5.  PATIENT SURVEYS:  FOTO not completed-prehab  COGNITION: Overall cognitive status:  Within functional limits for tasks assessed     SENSATION: Tingling and nerve pain going down leg.     PALPATION: Tightness in left hip flexors, reduced spring in post rotation of Rt innom.   LOWER EXTREMITY ROM:  PROM WFL but painful, empty end feel  LOWER EXTREMITY MMT:  MMT Right eval Left eval  Hip flexion    Hip extension    Hip  abduction    Hip adduction    Hip internal rotation    Hip external rotation    Knee flexion    Knee extension    Ankle dorsiflexion    Ankle plantarflexion    Ankle inversion    Ankle eversion     (Blank rows = not tested)  GAIT: Distance walked: [facility] without AD    TODAY'S TREATMENT                                                                            Treatment                            08/20/22:  Trigger Point Dry Needling, Manual Therapy Treatment:  Initial or subsequent education regarding Trigger Point Dry Needling: Initial Did patient give consent to treatment with Trigger Point Dry Needling: Yes TPDN with skilled palpation and monitoring followed by STM to the following muscles: Rt piriformis  Able to demo hip flexion against gravity to 90 following with less intense pain but did require UE assist to abduct/ER Lt SIJ upper quadrant PA Seated hip hinge in neutral & 2 angles of hip ER   Treatment                            08/10/22:  Trigger Point Dry Needling, Manual Therapy Treatment:  Initial or subsequent education regarding Trigger Point Dry Needling: Initial Did patient give consent to treatment with Trigger Point Dry Needling: Yes TPDN with skilled palpation and monitoring followed by STM to the following muscles: Lt piriformis, glut med/min, TFL  Ultrasound to rt glut med/min and piriformis- 8 min 100% 1.0 w/cm2   Treatment                            07/20/22:  Stretches: HS, gastrocs, piriformis, quads, adductors   PATIENT EDUCATION:  Education details: Geophysicist/field seismologist of condition, POC, HEP, exercise form/rationale Person educated: Patient Education method: Consulting civil engineer, Demonstration, Tactile cues, Verbal cues, and Handouts Education comprehension: verbalized understanding, returned demonstration, verbal cues required, tactile cues required, and needs further education  HOME EXERCISE PROGRAM: [id number] Hip traction by standing on left foot on elevated  surface  ASSESSMENT:  CLINICAL IMPRESSION: Able to reduce intensity of pain through the lower ranges of hip flexion with SI joint mobilizations and trigger point dry needling.  Patient denied lasting effect from ultrasounds we did not perform that again today.  Encouraged her to continue with recommendation from Dr. Ninfa Linden of modalities and keeping the hip as pain-free as possible while maintaining gentle mobility.  I left the patient upstairs with Dr. Sammuel Hines today to further investigate possibility of labral  tear input to hip pain.  OBJECTIVE IMPAIRMENTS: decreased activity tolerance, decreased ROM, increased muscle spasms, improper body mechanics, and pain.   ACTIVITY LIMITATIONS: carrying, lifting, bending, sitting, squatting, sleeping, stairs, and locomotion level  PARTICIPATION LIMITATIONS: meal prep, cleaning, laundry, driving, shopping, community activity, and occupation  PERSONAL FACTORS: 1-2 comorbidities: chronic back pain, known degeneration in hip, low bone density  are also affecting patient's functional outcome.   GOALS: Goals reviewed with patient? Yes  SHORT TERM GOALS: Target date: 08/23/2022  Determine need to change POC following surgical visit Baseline: Goal status: achieved    LONG TERM GOALS: Target date: POC date  Able to sleep without interruption by hip pain Baseline:  Goal status: INITIAL  2.  Able to sit for at least 20 min without increased hip pain Baseline:  Goal status: INITIAL  3.  Hip abd strength 90% of opp LE Baseline:  Goal status: INITIAL  4.  Able to navigate stairs with minimal pain Baseline:  Goal status: INITIAL   PLAN:  PT FREQUENCY: 1-2x/week  PT DURATION:8 weeks  PLANNED INTERVENTIONS: Therapeutic exercises, Therapeutic activity, Neuromuscular re-education, Balance training, Gait training, Patient/Family education, Self Care, Joint mobilization, Stair training, Aquatic Therapy, Dry Needling, Electrical stimulation, Spinal  mobilization, Cryotherapy, Moist heat, Taping, Ionotophoresis '4mg'$ /ml Dexamethasone, Manual therapy, and Re-evaluation  PLAN FOR NEXT SESSION: DN PRN, Consider ionto.   Cornell Gaber C. Demetric Dunnaway PT, DPT 08/20/22 8:38 AM

## 2022-08-20 ENCOUNTER — Ambulatory Visit (INDEPENDENT_AMBULATORY_CARE_PROVIDER_SITE_OTHER): Payer: 59 | Admitting: Orthopaedic Surgery

## 2022-08-20 ENCOUNTER — Ambulatory Visit (HOSPITAL_BASED_OUTPATIENT_CLINIC_OR_DEPARTMENT_OTHER): Payer: 59 | Attending: Family Medicine | Admitting: Physical Therapy

## 2022-08-20 DIAGNOSIS — M7632 Iliotibial band syndrome, left leg: Secondary | ICD-10-CM | POA: Insufficient documentation

## 2022-08-20 DIAGNOSIS — R262 Difficulty in walking, not elsewhere classified: Secondary | ICD-10-CM | POA: Diagnosis not present

## 2022-08-20 DIAGNOSIS — M7062 Trochanteric bursitis, left hip: Secondary | ICD-10-CM | POA: Insufficient documentation

## 2022-08-20 DIAGNOSIS — M6281 Muscle weakness (generalized): Secondary | ICD-10-CM | POA: Insufficient documentation

## 2022-08-20 DIAGNOSIS — M25552 Pain in left hip: Secondary | ICD-10-CM | POA: Diagnosis not present

## 2022-08-20 NOTE — Progress Notes (Signed)
Chief Complaint: Left hip pain     History of Present Illness:    Tammy [name] is a 61 y.o. female presents today with ongoing left hip pain which has been lasting and worse over the last several months.  This has been chronic in nature.  She denies any specific injury or incident.  She does enjoy being very active and enjoys freestyle swim as well as aerobic exercise on the elliptical.  She has been seen by my partner Dr. Zollie Beckers who has previously performed an IT injection.  She states that she did get some initial relief from this proximally.  She has been working in physical therapy with Janett Billow working on hip strengthening now for the last several months.  There has been somewhat limited relief from this.  She overall continues to have pain and weakness going up and down stairs.  She is stating that this is limiting her ability to be active or lay directly on the side.    Surgical History:   none  PMH/PSH/Family History/Social History/Meds/Allergies:    Past Medical History:  Diagnosis Date   Arthritis    Chronic fatigue syndrome    Diverticulosis    External hemorrhoids    Hypertension    Internal hemorrhoids    Neuropathy    Spinal stenosis    Past Surgical History:  Procedure Laterality Date   CARDIAC CATHETERIZATION  09/18/2012   LEFT HEART CATHETERIZATION WITH CORONARY ANGIOGRAM N/A 09/18/2012   Procedure: LEFT HEART CATHETERIZATION WITH CORONARY ANGIOGRAM;  Surgeon: Sherren Mocha, MD;  Location: [facility] CATH LAB;  Service: Cardiovascular;  Laterality: N/A;   TONSILLECTOMY  1965   Social History   Socioeconomic History   Marital status: Married    Spouse name: Not on file   Number of children: 2   Years of education: Not on file   Highest education level: Not on file  Occupational History   Occupation: Therapist    Employer: [employer]  Tobacco Use   Smoking status: Never   Smokeless tobacco: Never  Substance and  Sexual Activity   Alcohol use: No   Drug use: No   Sexual activity: Yes  Other Topics Concern   Not on file  Social History Narrative   Married. 2 adopted children- adopted daughter (10) when single after first marriage from [place]. Son from [place] with 2nd husband(18) in 2021. 1st husband local physician Linus Mako      LCSW with [employer]      Hobbies: animal rescue- has litter of kittens in 2021, walking, time outside   Social Determinants of Health   Financial Resource Strain: Not on file  Food Insecurity: Not on file  Transportation Needs: Not on file  Physical Activity: Not on file  Stress: Not on file  Social Connections: Not on file   Family History  Problem Relation Age of Onset   Heart attack Maternal Grandmother 5   Arthritis Maternal Grandmother    Hyperlipidemia Maternal Grandmother    Heart disease Maternal Grandmother    Diabetes Maternal Grandmother    Pancreatic cancer Mother        died 76. 2.5 years past diagnosis.     Anxiety disorder Mother    Depression Mother    Colon polyps Father    Healthy Sister  Healthy Brother    Depression Brother    Heart attack Brother        age 75   Suicidality Maternal Grandfather    Colon cancer Neg Hx    Esophageal cancer Neg Hx    Kidney disease Neg Hx    Allergies  Allergen Reactions   Eggs Or Egg-Derived Products Other (See Comments)    Makes pt feel like burping up rotten eggs   Current Outpatient Medications  Medication Sig Dispense Refill   ACETAMINOPHEN-CAFFEINE PO Take by mouth in the morning.     ARIPiprazole (ABILIFY) 2 MG tablet Take 1/2 to 1 tablet by mouth once daily. 90 tablet 1   clonazePAM (KLONOPIN) 0.5 MG tablet Take 1 tablet (0.5 mg total) by mouth daily as needed. 30 tablet 0   COVID-19 mRNA vaccine 2023-2024 (COMIRNATY) syringe Inject into the muscle. 0.3 mL 0   FLUoxetine (PROZAC) 10 MG capsule Take 3 capsules (30 mg total) by mouth daily. 270 capsule 1   hydrochlorothiazide  (HYDRODIURIL) 25 MG tablet Take 1 tablet (25 mg total) by mouth daily. 90 tablet 3   lisdexamfetamine (VYVANSE) 20 MG capsule Take 1 capsule by mouth daily. 90 capsule 0   lisdexamfetamine (VYVANSE) 20 MG capsule Take 1 capsule (20 mg total) by mouth daily. 90 capsule 0   MAGNESIUM CHLORIDE-CALCIUM PO Take by mouth.     Melatonin 3 MG CAPS Take 6 mg by mouth at bedtime.     ondansetron (ZOFRAN-ODT) 4 MG disintegrating tablet Dissolve 1 tablet on the tongue every 8 hours as needed 20 tablet 0   pantoprazole (PROTONIX) 20 MG tablet Take 1 tablet by mouth daily. 90 tablet 3   Probiotic Product (PROBIOTIC DAILY PO) Take by mouth.     Semaglutide-Weight Management (WEGOVY) 0.25 MG/0.5ML SOAJ Inject 0.25 mg 2 mL 0   Semaglutide-Weight Management (WEGOVY) 0.5 MG/0.5ML SOAJ Inject 0.5 mg subcutaneously as directed 2 mL 0   Semaglutide-Weight Management (WEGOVY) 1 MG/0.5ML SOAJ Inject 1 mg into the skin once a week. 2 mL 3   Semaglutide-Weight Management (WEGOVY) 1.7 MG/0.75ML SOAJ Inject 1 pen (1.7 MG) into the skin once a week 3 mL 0   No current facility-administered medications for this visit.   No results found.  Review of Systems:   A ROS was performed including pertinent positives and negatives as documented in the HPI.  Physical Exam :   Constitutional: NAD and appears stated age Neurological: Alert and oriented Psych: Appropriate affect and cooperative Last menstrual period 11/05/2007.   Comprehensive Musculoskeletal Exam:    There is tenderness about the greater trochanter with radiation from the gluteus muscles.  Minimal tenderness about the SI joint.  There is pain with resisted abduction of the left hip.  Positive Trendelenburg on the left hip.  She has 40 degrees of internal/external rotation of the left hip with minimal pain.  Negative FADIR.  Imaging:   Xray (3 views left hip): Very mild degenerative findings with some calcification of the labrum  MRI (pelvis): There appears  to be a tear of the anterior portion of the gluteus medius tendon as well as the gluteus minimus tendon  I personally reviewed and interpreted the radiographs.   Assessment:   61 y.o. female with left hip gluteus medius tear as well as gluteus minimus tear.  At this time she has trialed multiple months of physical therapy.  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  At  this time she is working on therapy which I do believe would be the initial correct step in order to get her some relief.  She has hoping to get some relief from the holidays since that effect I recommended ultrasound-guided injection of the gluteus tendon.  She would like to proceed with this.  I will plan to see her back in 1 month for reassessment.  Plan :    -Return to clinic 1 month for reassessment     I personally saw and evaluated the patient, and participated in the management and treatment plan.  Vanetta Mulders, MD Attending Physician, Orthopedic Surgery  This document was dictated using Dragon voice recognition software. A reasonable attempt at proof reading has been made to minimize errors.

## 2022-08-23 ENCOUNTER — Other Ambulatory Visit (HOSPITAL_COMMUNITY): Payer: Self-pay

## 2022-08-27 ENCOUNTER — Ambulatory Visit (HOSPITAL_BASED_OUTPATIENT_CLINIC_OR_DEPARTMENT_OTHER): Payer: 59 | Admitting: Physical Therapy

## 2022-08-27 ENCOUNTER — Encounter (HOSPITAL_BASED_OUTPATIENT_CLINIC_OR_DEPARTMENT_OTHER): Payer: Self-pay | Admitting: Physical Therapy

## 2022-08-27 DIAGNOSIS — M7632 Iliotibial band syndrome, left leg: Secondary | ICD-10-CM | POA: Diagnosis not present

## 2022-08-27 DIAGNOSIS — M7062 Trochanteric bursitis, left hip: Secondary | ICD-10-CM | POA: Diagnosis not present

## 2022-08-27 DIAGNOSIS — R262 Difficulty in walking, not elsewhere classified: Secondary | ICD-10-CM

## 2022-08-27 DIAGNOSIS — M25552 Pain in left hip: Secondary | ICD-10-CM

## 2022-08-27 DIAGNOSIS — M6281 Muscle weakness (generalized): Secondary | ICD-10-CM

## 2022-08-27 NOTE — Therapy (Signed)
OUTPATIENT PHYSICAL THERAPY LOWER EXTREMITY TREATMENT   Patient Name: Tammy [name] MRN: [id number] DOB:[birth year], 61 y.o., female Today's Date: 08/27/2022   PT End of Session - 08/27/22 0814     Visit Number 5    Number of Visits 20    Date for PT Re-Evaluation 10/08/22    Authorization Type [employer] [employer]    Authorization - Number of Visits 28    PT Start Time 0802    PT Stop Time 0845    PT Time Calculation (min) 43 min    Activity Tolerance Patient tolerated treatment well    Behavior During Therapy [facility] for tasks assessed/performed               Past Medical History:  Diagnosis Date   Arthritis    Chronic fatigue syndrome    Diverticulosis    External hemorrhoids    Hypertension    Internal hemorrhoids    Neuropathy    Spinal stenosis    Past Surgical History:  Procedure Laterality Date   CARDIAC CATHETERIZATION  09/18/2012   LEFT HEART CATHETERIZATION WITH CORONARY ANGIOGRAM N/A 09/18/2012   Procedure: LEFT HEART CATHETERIZATION WITH CORONARY ANGIOGRAM;  Surgeon: Sherren Mocha, MD;  Location: [facility] CATH LAB;  Service: Cardiovascular;  Laterality: N/A;   TONSILLECTOMY  1965   Patient Active Problem List   Diagnosis Date Noted   Arthritis of left hip 06/25/2022   Hip flexor tendon tightness, left 04/02/2022   Greater trochanteric bursitis of left hip 01/11/2022   Anxiety 07/24/2020   Nonallopathic lesion of cervical region 04/03/2020   Nonallopathic lesion of thoracic region 04/03/2020   Nonallopathic lesion of lumbar region 04/03/2020   Cervical stenosis of spine 03/31/2020   Attention deficit disorder (ADD) in adult 03/31/2020   Low bone density 03/17/2020   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[place]) 02/15/2020   GERD (gastroesophageal reflux disease) 02/15/2020   Allergic rhinitis 03/09/2016   Essential hypertension 02/18/2014   Chronic pain syndrome 02/22/2013   Chronic insomnia 02/22/2013   Paresthesias 10/25/2012   Hyperlipidemia  09/18/2012     REFERRING PROVIDER: [name], DO  REFERRING DIAG: I96.78 (ICD-10-CM) - Greater trochanteric bursitis of left hip  THERAPY DIAG:  Trochanteric bursitis, left hip  It band syndrome, left  Pain in left hip  Difficulty in walking, not elsewhere classified  Muscle weakness (generalized)  Rationale for Evaluation and Treatment Rehabilitation  ONSET DATE: subacute on chronic  SUBJECTIVE:   SUBJECTIVE STATEMENT: The spot we DN did not come back (on the right side). Was able to do stairs next day after injection but then hurt again.   PERTINENT HISTORY: LBP, low bone density PAIN:  Are you having pain? Yes: NPRS scale: 2 sitting, up stairs 5/10 Pain location: Lt leg Pain description: sharp, achey Aggravating factors: sitting, going up stairs Relieving factors: standing  PRECAUTIONS: None  WEIGHT BEARING RESTRICTIONS: No  FALLS:  Has patient fallen in last 6 months? No   OCCUPATION: psychologist  PLOF: Independent  PATIENT GOALS: decrease pain   OBJECTIVE:   DIAGNOSTIC FINDINGS:  MRI pelvis 9/0/23 IMPRESSION: 1. High-grade partial-thickness cartilage loss with areas of full-thickness cartilage loss of the left femoral head and acetabulum with mild subchondral reactive marrow edema in the superior anterior acetabulum consistent with moderate-severe osteoarthritis. 2. Partial-thickness cartilage loss of the right femoral head and acetabulum. 3. Right anterior labral tear. Left anterior labral tear with a 10 mm paralabral cyst. 4. Broad-based disc bulge at L4-5 with a central  annular fissure. Mild left and moderate right facet arthropathy at L4-5.  PATIENT SURVEYS:  FOTO not completed-prehab  COGNITION: Overall cognitive status: Within functional limits for tasks assessed     SENSATION: Tingling and nerve pain going down leg.     PALPATION: Tightness in left hip flexors, reduced spring in post rotation of Rt innom.   LOWER  EXTREMITY ROM:  PROM WFL but painful, empty end feel  LOWER EXTREMITY MMT:  MMT Right eval Left eval  Hip flexion    Hip extension    Hip abduction    Hip adduction    Hip internal rotation    Hip external rotation    Knee flexion    Knee extension    Ankle dorsiflexion    Ankle plantarflexion    Ankle inversion    Ankle eversion     (Blank rows = not tested)  GAIT: Distance walked: [facility] without AD    TODAY'S TREATMENT                                                                            Treatment                            08/27/22:  Discussion regarding MD appt IASTM Lt ITB Feet on wall bridges, knees at 90 & with knees close to exetnsion on heels Wall bridge with marching, alt; also added opp UE flexion Ionto, dex 1cc 6 hr wear   Treatment                            08/20/22:  Trigger Point Dry Needling, Manual Therapy Treatment:  Initial or subsequent education regarding Trigger Point Dry Needling: Initial Did patient give consent to treatment with Trigger Point Dry Needling: Yes TPDN with skilled palpation and monitoring followed by STM to the following muscles: Rt piriformis  Able to demo hip flexion against gravity to 90 following with less intense pain but did require UE assist to abduct/ER Lt SIJ upper quadrant PA Seated hip hinge in neutral & 2 angles of hip ER   Treatment                            08/10/22:  Trigger Point Dry Needling, Manual Therapy Treatment:  Initial or subsequent education regarding Trigger Point Dry Needling: Initial Did patient give consent to treatment with Trigger Point Dry Needling: Yes TPDN with skilled palpation and monitoring followed by STM to the following muscles: Lt piriformis, glut med/min, TFL  Ultrasound to rt glut med/min and piriformis- 8 min 100% 1.0 w/cm2   Treatment                            07/20/22:  Stretches: HS, gastrocs, piriformis, quads, adductors   PATIENT EDUCATION:  Education details:  Geophysicist/field seismologist of condition, POC, HEP, exercise form/rationale Person educated: Patient Education method: Explanation, Demonstration, Tactile cues, Verbal cues, and Handouts Education comprehension: verbalized understanding, returned demonstration, verbal cues required, tactile cues required, and needs further education  HOME EXERCISE  PROGRAM: [id number] Hip traction by standing on left foot on elevated surface  ASSESSMENT:  CLINICAL IMPRESSION: Continued focus on decreasing pain at ITB and strengthening glut max/hamstrings. Ionto placed at greater throchanter bursa and will determine outcome at next visit.    OBJECTIVE IMPAIRMENTS: decreased activity tolerance, decreased ROM, increased muscle spasms, improper body mechanics, and pain.   ACTIVITY LIMITATIONS: carrying, lifting, bending, sitting, squatting, sleeping, stairs, and locomotion level  PARTICIPATION LIMITATIONS: meal prep, cleaning, laundry, driving, shopping, community activity, and occupation  PERSONAL FACTORS: 1-2 comorbidities: chronic back pain, known degeneration in hip, low bone density  are also affecting patient's functional outcome.   GOALS: Goals reviewed with patient? Yes  SHORT TERM GOALS: Target date: 08/23/2022  Determine need to change POC following surgical visit Baseline: Goal status: achieved    LONG TERM GOALS: Target date: POC date  Able to sleep without interruption by hip pain Baseline:  Goal status: INITIAL  2.  Able to sit for at least 20 min without increased hip pain Baseline:  Goal status: INITIAL  3.  Hip abd strength 90% of opp LE Baseline:  Goal status: INITIAL  4.  Able to navigate stairs with minimal pain Baseline:  Goal status: INITIAL   PLAN:  PT FREQUENCY: 1-2x/week  PT DURATION:8 weeks  PLANNED INTERVENTIONS: Therapeutic exercises, Therapeutic activity, Neuromuscular re-education, Balance training, Gait training, Patient/Family education, Self Care, Joint mobilization,  Stair training, Aquatic Therapy, Dry Needling, Electrical stimulation, Spinal mobilization, Cryotherapy, Moist heat, Taping, Ionotophoresis '4mg'$ /ml Dexamethasone, Manual therapy, and Re-evaluation  PLAN FOR NEXT SESSION: DN PRN, ionto outcome?   Amberia Bayless C. Trisha Ken PT, DPT 08/27/22 1:54 PM

## 2022-08-31 ENCOUNTER — Encounter (HOSPITAL_BASED_OUTPATIENT_CLINIC_OR_DEPARTMENT_OTHER): Payer: Self-pay | Admitting: Physical Therapy

## 2022-08-31 ENCOUNTER — Ambulatory Visit (HOSPITAL_BASED_OUTPATIENT_CLINIC_OR_DEPARTMENT_OTHER): Payer: 59 | Admitting: Physical Therapy

## 2022-08-31 DIAGNOSIS — M7632 Iliotibial band syndrome, left leg: Secondary | ICD-10-CM

## 2022-08-31 DIAGNOSIS — M6281 Muscle weakness (generalized): Secondary | ICD-10-CM | POA: Diagnosis not present

## 2022-08-31 DIAGNOSIS — M7062 Trochanteric bursitis, left hip: Secondary | ICD-10-CM

## 2022-08-31 DIAGNOSIS — M25552 Pain in left hip: Secondary | ICD-10-CM

## 2022-08-31 DIAGNOSIS — R262 Difficulty in walking, not elsewhere classified: Secondary | ICD-10-CM | POA: Diagnosis not present

## 2022-08-31 NOTE — Therapy (Signed)
OUTPATIENT PHYSICAL THERAPY LOWER EXTREMITY TREATMENT   Patient Name: Tammy [name] MRN: [id number] DOB:10/21/1960, 61 y.o., female Today's Date: 08/31/2022   PT End of Session - 08/31/22 0803     Visit Number 6    Number of Visits 20    Date for PT Re-Evaluation 10/08/22    Authorization Type [employer]    Authorization - Number of Visits 28    PT Start Time 0801    PT Stop Time 6967    PT Time Calculation (min) 41 min    Activity Tolerance Patient tolerated treatment well    Behavior During Therapy [facility] for tasks assessed/performed               Past Medical History:  Diagnosis Date   Arthritis    Chronic fatigue syndrome    Diverticulosis    External hemorrhoids    Hypertension    Internal hemorrhoids    Neuropathy    Spinal stenosis    Past Surgical History:  Procedure Laterality Date   CARDIAC CATHETERIZATION  09/18/2012   LEFT HEART CATHETERIZATION WITH CORONARY ANGIOGRAM N/A 09/18/2012   Procedure: LEFT HEART CATHETERIZATION WITH CORONARY ANGIOGRAM;  Surgeon: Sherren Mocha, MD;  Location: [facility] CATH LAB;  Service: Cardiovascular;  Laterality: N/A;   TONSILLECTOMY  1965   Patient Active Problem List   Diagnosis Date Noted   Arthritis of left hip 06/25/2022   Hip flexor tendon tightness, left 04/02/2022   Greater trochanteric bursitis of left hip 01/11/2022   Anxiety 07/24/2020   Nonallopathic lesion of cervical region 04/03/2020   Nonallopathic lesion of thoracic region 04/03/2020   Nonallopathic lesion of lumbar region 04/03/2020   Cervical stenosis of spine 03/31/2020   Attention deficit disorder (ADD) in adult 03/31/2020   Low bone density 03/17/2020   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[place]) 02/15/2020   GERD (gastroesophageal reflux disease) 02/15/2020   Allergic rhinitis 03/09/2016   Essential hypertension 02/18/2014   Chronic pain syndrome 02/22/2013   Chronic insomnia 02/22/2013   Paresthesias 10/25/2012   Hyperlipidemia  09/18/2012     REFERRING PROVIDER: [name], DO  REFERRING DIAG: M70.62 (ICD-10-CM) - Greater trochanteric bursitis of left hip  THERAPY DIAG:  Trochanteric bursitis, left hip  It band syndrome, left  Pain in left hip  Rationale for Evaluation and Treatment Rehabilitation  ONSET DATE: subacute on chronic  SUBJECTIVE:   SUBJECTIVE STATEMENT: I think the patch helped. ITB was sore.   PERTINENT HISTORY: LBP, low bone density PAIN:  Are you having pain? Yes: NPRS scale: 2 sitting, up stairs 5/10 Pain location: Lt leg Pain description: sharp, achey Aggravating factors: sitting, going up stairs Relieving factors: standing  PRECAUTIONS: None  WEIGHT BEARING RESTRICTIONS: No  FALLS:  Has patient fallen in last 6 months? No   OCCUPATION: psychologist  PLOF: Independent  PATIENT GOALS: decrease pain   OBJECTIVE:   DIAGNOSTIC FINDINGS:  MRI pelvis 9/0/23 IMPRESSION: 1. High-grade partial-thickness cartilage loss with areas of full-thickness cartilage loss of the left femoral head and acetabulum with mild subchondral reactive marrow edema in the superior anterior acetabulum consistent with moderate-severe osteoarthritis. 2. Partial-thickness cartilage loss of the right femoral head and acetabulum. 3. Right anterior labral tear. Left anterior labral tear with a 10 mm paralabral cyst. 4. Broad-based disc bulge at L4-5 with a central annular fissure. Mild left and moderate right facet arthropathy at L4-5.  PATIENT SURVEYS:  FOTO not completed-prehab  COGNITION: Overall cognitive status: Within functional limits for tasks  assessed     SENSATION: Tingling and nerve pain going down leg.     PALPATION: Tightness in left hip flexors, reduced spring in post rotation of Rt innom.   LOWER EXTREMITY ROM:  PROM WFL but painful, empty end feel  LOWER EXTREMITY MMT:  MMT Right eval Left eval  Hip flexion    Hip extension    Hip abduction    Hip  adduction    Hip internal rotation    Hip external rotation    Knee flexion    Knee extension    Ankle dorsiflexion    Ankle plantarflexion    Ankle inversion    Ankle eversion     (Blank rows = not tested)  GAIT: Distance walked: [facility] without AD    TODAY'S TREATMENT                                                                            Treatment                            08/31/22:  Elliptical 5 min L8 Trial of leg press- stopped due to pain in starting position Seated hip hinge Discussed swimming exercises Seated round back- +shoulder flexion bil & alt LAQ with add Add with pelvic floor set Ionto dex 1cc 6 hr wear   Treatment                            08/27/22:  Discussion regarding MD appt IASTM Lt ITB Feet on wall bridges, knees at 90 & with knees close to exetnsion on heels Wall bridge with marching, alt; also added opp UE flexion Ionto, dex 1cc 6 hr wear   Treatment                            08/20/22:  Trigger Point Dry Needling, Manual Therapy Treatment:  Initial or subsequent education regarding Trigger Point Dry Needling: Initial Did patient give consent to treatment with Trigger Point Dry Needling: Yes TPDN with skilled palpation and monitoring followed by STM to the following muscles: Rt piriformis  Able to demo hip flexion against gravity to 90 following with less intense pain but did require UE assist to abduct/ER Lt SIJ upper quadrant PA Seated hip hinge in neutral & 2 angles of hip ER   Treatment                            08/10/22:  Trigger Point Dry Needling, Manual Therapy Treatment:  Initial or subsequent education regarding Trigger Point Dry Needling: Initial Did patient give consent to treatment with Trigger Point Dry Needling: Yes TPDN with skilled palpation and monitoring followed by STM to the following muscles: Lt piriformis, glut med/min, TFL  Ultrasound to rt glut med/min and piriformis- 8 min 100% 1.0 w/cm2   Treatment                             07/20/22:  Stretches: HS, gastrocs, piriformis, quads,  adductors   PATIENT EDUCATION:  Education details: Anatomy of condition, POC, HEP, exercise form/rationale Person educated: Patient Education method: Explanation, Demonstration, Tactile cues, Verbal cues, and Handouts Education comprehension: verbalized understanding, returned demonstration, verbal cues required, tactile cues required, and needs further education  HOME EXERCISE PROGRAM: [id number] Hip traction by standing on left foot on elevated surface  ASSESSMENT:  CLINICAL IMPRESSION: Second dose of ionto placed at greater trochanter bursa today. Will continue to work stability through lumbopelvic region in tolerance. Elliptical trialed today and requested that she be aware of her soreness during the day after various exercises   OBJECTIVE IMPAIRMENTS: decreased activity tolerance, decreased ROM, increased muscle spasms, improper body mechanics, and pain.   ACTIVITY LIMITATIONS: carrying, lifting, bending, sitting, squatting, sleeping, stairs, and locomotion level  PARTICIPATION LIMITATIONS: meal prep, cleaning, laundry, driving, shopping, community activity, and occupation  PERSONAL FACTORS: 1-2 comorbidities: chronic back pain, known degeneration in hip, low bone density  are also affecting patient's functional outcome.   GOALS: Goals reviewed with patient? Yes  SHORT TERM GOALS: Target date: 08/23/2022  Determine need to change POC following surgical visit Baseline: Goal status: achieved    LONG TERM GOALS: Target date: POC date  Able to sleep without interruption by hip pain Baseline:  Goal status: INITIAL  2.  Able to sit for at least 20 min without increased hip pain Baseline:  Goal status: INITIAL  3.  Hip abd strength 90% of opp LE Baseline:  Goal status: INITIAL  4.  Able to navigate stairs with minimal pain Baseline:  Goal status: INITIAL   PLAN:  PT FREQUENCY:  1-2x/week  PT DURATION:8 weeks  PLANNED INTERVENTIONS: Therapeutic exercises, Therapeutic activity, Neuromuscular re-education, Balance training, Gait training, Patient/Family education, Self Care, Joint mobilization, Stair training, Aquatic Therapy, Dry Needling, Electrical stimulation, Spinal mobilization, Cryotherapy, Moist heat, Taping, Ionotophoresis '4mg'$ /ml Dexamethasone, Manual therapy, and Re-evaluation  PLAN FOR NEXT SESSION: DN PRN, icontinue ionto, progress core strength   Carolyn Sylvia C. Yazlyn Wentzel PT, DPT 08/31/22 2:15 PM

## 2022-09-01 DIAGNOSIS — M25552 Pain in left hip: Secondary | ICD-10-CM | POA: Diagnosis not present

## 2022-09-01 DIAGNOSIS — Z8639 Personal history of other endocrine, nutritional and metabolic disease: Secondary | ICD-10-CM | POA: Diagnosis not present

## 2022-09-01 DIAGNOSIS — Z6826 Body mass index (BMI) 26.0-26.9, adult: Secondary | ICD-10-CM | POA: Diagnosis not present

## 2022-09-01 DIAGNOSIS — R632 Polyphagia: Secondary | ICD-10-CM | POA: Diagnosis not present

## 2022-09-01 DIAGNOSIS — F5081 Binge eating disorder: Secondary | ICD-10-CM | POA: Diagnosis not present

## 2022-09-01 DIAGNOSIS — E663 Overweight: Secondary | ICD-10-CM | POA: Diagnosis not present

## 2022-09-02 ENCOUNTER — Encounter: Payer: Self-pay | Admitting: *Deleted

## 2022-09-02 ENCOUNTER — Other Ambulatory Visit (HOSPITAL_COMMUNITY): Payer: Self-pay

## 2022-09-02 DIAGNOSIS — F411 Generalized anxiety disorder: Secondary | ICD-10-CM | POA: Diagnosis not present

## 2022-09-02 DIAGNOSIS — F332 Major depressive disorder, recurrent severe without psychotic features: Secondary | ICD-10-CM | POA: Diagnosis not present

## 2022-09-02 MED ORDER — ZEPBOUND 2.5 MG/0.5ML ~~LOC~~ SOAJ
2.5000 mg | SUBCUTANEOUS | 2 refills | Status: DC
  Filled 2022-09-02 – 2022-09-17 (×2): qty 2, 28d supply, fill #0
  Filled 2022-10-14: qty 2, 28d supply, fill #1

## 2022-09-02 NOTE — Therapy (Signed)
OUTPATIENT PHYSICAL THERAPY LOWER EXTREMITY TREATMENT   Patient Name: Tammy [name] MRN: [id number] DOB:[birth year], 61 y.o., female Today's Date: 09/03/2022   PT End of Session - 09/03/22 0801     Visit Number 7    Number of Visits 20    Date for PT Re-Evaluation 10/08/22    Authorization Type [place]    Authorization - Number of Visits 28    PT Start Time 0800    PT Stop Time 0845    PT Time Calculation (min) 45 min    Activity Tolerance Patient tolerated treatment well    Behavior During Therapy [facility] for tasks assessed/performed                Past Medical History:  Diagnosis Date   Arthritis    Chronic fatigue syndrome    Diverticulosis    External hemorrhoids    Hypertension    Internal hemorrhoids    Neuropathy    Spinal stenosis    Past Surgical History:  Procedure Laterality Date   CARDIAC CATHETERIZATION  09/18/2012   LEFT HEART CATHETERIZATION WITH CORONARY ANGIOGRAM N/A 09/18/2012   Procedure: LEFT HEART CATHETERIZATION WITH CORONARY ANGIOGRAM;  Surgeon: Sherren Mocha, MD;  Location: [facility] CATH LAB;  Service: Cardiovascular;  Laterality: N/A;   TONSILLECTOMY  1965   Patient Active Problem List   Diagnosis Date Noted   Arthritis of left hip 06/25/2022   Hip flexor tendon tightness, left 04/02/2022   Greater trochanteric bursitis of left hip 01/11/2022   Anxiety 07/24/2020   Nonallopathic lesion of cervical region 04/03/2020   Nonallopathic lesion of thoracic region 04/03/2020   Nonallopathic lesion of lumbar region 04/03/2020   Cervical stenosis of spine 03/31/2020   Attention deficit disorder (ADD) in adult 03/31/2020   Low bone density 03/17/2020   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[place]) 02/15/2020   GERD (gastroesophageal reflux disease) 02/15/2020   Allergic rhinitis 03/09/2016   Essential hypertension 02/18/2014   Chronic pain syndrome 02/22/2013   Chronic insomnia 02/22/2013   Paresthesias 10/25/2012   Hyperlipidemia  09/18/2012     REFERRING PROVIDER: [name], DO  REFERRING DIAG: M70.62 (ICD-10-CM) - Greater trochanteric bursitis of left hip  THERAPY DIAG:  Trochanteric bursitis, left hip  It band syndrome, left  Pain in left hip  Rationale for Evaluation and Treatment Rehabilitation  ONSET DATE: subacute on chronic  SUBJECTIVE:   SUBJECTIVE STATEMENT: It was really flared up after last time.   PERTINENT HISTORY: LBP, low bone density PAIN:  Are you having pain? Yes: NPRS scale: 2 sitting, up stairs 5/10 Pain location: Lt leg Pain description: sharp, achey Aggravating factors: sitting, going up stairs Relieving factors: standing  PRECAUTIONS: None  WEIGHT BEARING RESTRICTIONS: No  FALLS:  Has patient fallen in last 6 months? No   OCCUPATION: psychologist  PLOF: Independent  PATIENT GOALS: decrease pain   OBJECTIVE:   DIAGNOSTIC FINDINGS:  MRI pelvis 9/0/23 IMPRESSION: 1. High-grade partial-thickness cartilage loss with areas of full-thickness cartilage loss of the left femoral head and acetabulum with mild subchondral reactive marrow edema in the superior anterior acetabulum consistent with moderate-severe osteoarthritis. 2. Partial-thickness cartilage loss of the right femoral head and acetabulum. 3. Right anterior labral tear. Left anterior labral tear with a 10 mm paralabral cyst. 4. Broad-based disc bulge at L4-5 with a central annular fissure. Mild left and moderate right facet arthropathy at L4-5.  PATIENT SURVEYS:  FOTO not completed-prehab  COGNITION: Overall cognitive status: Within functional limits for  tasks assessed     SENSATION: Tingling and nerve pain going down leg.     PALPATION: Tightness in left hip flexors, reduced spring in post rotation of Rt innom.   LOWER EXTREMITY ROM:  PROM WFL but painful, empty end feel  LOWER EXTREMITY MMT:  MMT Right eval Left eval  Hip flexion    Hip extension    Hip abduction    Hip  adduction    Hip internal rotation    Hip external rotation    Knee flexion    Knee extension    Ankle dorsiflexion    Ankle plantarflexion    Ankle inversion    Ankle eversion     (Blank rows = not tested)  GAIT: Distance walked: [facility] without AD    TODAY'S TREATMENT                                                                            Treatment                            09/03/22:  Discussion regarding daily exercises and frequent mobility Standing weight shift into heels Increased toe off in gait IASTM Lt Quads, ITB, HS Prone Lt upper quadrant SIJ PA spring mob Ionto 1cc dex, 6 hr wear   Treatment                            08/31/22:  Elliptical 5 min L8 Trial of leg press- stopped due to pain in starting position Seated hip hinge Discussed swimming exercises Seated round back- +shoulder flexion bil & alt LAQ with add Add with pelvic floor set Ionto dex 1cc 6 hr wear   Treatment                            08/27/22:  Discussion regarding MD appt IASTM Lt ITB Feet on wall bridges, knees at 90 & with knees close to exetnsion on heels Wall bridge with marching, alt; also added opp UE flexion Ionto, dex 1cc 6 hr wear    PATIENT EDUCATION:  Education details: Geophysicist/field seismologist of condition, POC, HEP, exercise form/rationale Person educated: Patient Education method: Explanation, Demonstration, Tactile cues, Verbal cues, and Handouts Education comprehension: verbalized understanding, returned demonstration, verbal cues required, tactile cues required, and needs further education  HOME EXERCISE PROGRAM: [id number] Hip traction by standing on left foot on elevated surface  ASSESSMENT:  CLINICAL IMPRESSION: Asked that she focus on stretches, posture, frequenty mobility, self SIJ PA spring and walking daily shy of a painful time. 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    OBJECTIVE IMPAIRMENTS: decreased activity tolerance, decreased ROM,  increased muscle spasms, improper body mechanics, and pain.   ACTIVITY LIMITATIONS: carrying, lifting, bending, sitting, squatting, sleeping, stairs, and locomotion level  PARTICIPATION LIMITATIONS: meal prep, cleaning, laundry, driving, shopping, community activity, and occupation  PERSONAL FACTORS: 1-2 comorbidities: chronic back pain, known degeneration in hip, low bone density  are also affecting patient's functional outcome.   GOALS: Goals reviewed with patient? Yes  SHORT TERM GOALS: Target  date: 08/23/2022  Determine need to change POC following surgical visit Baseline: Goal status: achieved    LONG TERM GOALS: Target date: POC date  Able to sleep without interruption by hip pain Baseline:  Goal status: INITIAL  2.  Able to sit for at least 20 min without increased hip pain Baseline:  Goal status: INITIAL  3.  Hip abd strength 90% of opp LE Baseline:  Goal status: INITIAL  4.  Able to navigate stairs with minimal pain Baseline:  Goal status: INITIAL   PLAN:  PT FREQUENCY: 1-2x/week  PT DURATION:8 weeks  PLANNED INTERVENTIONS: Therapeutic exercises, Therapeutic activity, Neuromuscular re-education, Balance training, Gait training, Patient/Family education, Self Care, Joint mobilization, Stair training, Aquatic Therapy, Dry Needling, Electrical stimulation, Spinal mobilization, Cryotherapy, Moist heat, Taping, Ionotophoresis '4mg'$ /ml Dexamethasone, Manual therapy, and Re-evaluation  PLAN FOR NEXT SESSION: DN PRN, icontinue ionto, progress core strength   Darryle Dennie C. Koreena Joost PT, DPT 09/03/22 8:55 AM

## 2022-09-03 ENCOUNTER — Ambulatory Visit (HOSPITAL_BASED_OUTPATIENT_CLINIC_OR_DEPARTMENT_OTHER): Payer: 59 | Admitting: Physical Therapy

## 2022-09-03 ENCOUNTER — Encounter (HOSPITAL_BASED_OUTPATIENT_CLINIC_OR_DEPARTMENT_OTHER): Payer: Self-pay | Admitting: Physical Therapy

## 2022-09-03 DIAGNOSIS — M6281 Muscle weakness (generalized): Secondary | ICD-10-CM | POA: Diagnosis not present

## 2022-09-03 DIAGNOSIS — M25552 Pain in left hip: Secondary | ICD-10-CM | POA: Diagnosis not present

## 2022-09-03 DIAGNOSIS — M7632 Iliotibial band syndrome, left leg: Secondary | ICD-10-CM | POA: Diagnosis not present

## 2022-09-03 DIAGNOSIS — M7062 Trochanteric bursitis, left hip: Secondary | ICD-10-CM | POA: Diagnosis not present

## 2022-09-03 DIAGNOSIS — R262 Difficulty in walking, not elsewhere classified: Secondary | ICD-10-CM | POA: Diagnosis not present

## 2022-09-06 ENCOUNTER — Other Ambulatory Visit (HOSPITAL_COMMUNITY): Payer: Self-pay

## 2022-09-07 ENCOUNTER — Encounter (HOSPITAL_BASED_OUTPATIENT_CLINIC_OR_DEPARTMENT_OTHER): Payer: Self-pay | Admitting: Physical Therapy

## 2022-09-07 ENCOUNTER — Ambulatory Visit (HOSPITAL_BASED_OUTPATIENT_CLINIC_OR_DEPARTMENT_OTHER): Payer: 59 | Admitting: Physical Therapy

## 2022-09-07 DIAGNOSIS — R262 Difficulty in walking, not elsewhere classified: Secondary | ICD-10-CM | POA: Diagnosis not present

## 2022-09-07 DIAGNOSIS — M25552 Pain in left hip: Secondary | ICD-10-CM

## 2022-09-07 DIAGNOSIS — M6281 Muscle weakness (generalized): Secondary | ICD-10-CM | POA: Diagnosis not present

## 2022-09-07 DIAGNOSIS — M7632 Iliotibial band syndrome, left leg: Secondary | ICD-10-CM | POA: Diagnosis not present

## 2022-09-07 DIAGNOSIS — M7062 Trochanteric bursitis, left hip: Secondary | ICD-10-CM

## 2022-09-07 NOTE — Therapy (Signed)
OUTPATIENT PHYSICAL THERAPY LOWER EXTREMITY TREATMENT   Patient Name: Tammy [name] MRN: [id number] DOB:[birth year], 61 y.o., female Today's Date: 09/07/2022   PT End of Session - 09/07/22 0800     Visit Number 8    Number of Visits 20    Date for PT Re-Evaluation 10/08/22    Authorization Type [place]    Authorization - Number of Visits 28    PT Start Time 0759    PT Stop Time 0845    PT Time Calculation (min) 46 min    Activity Tolerance Patient tolerated treatment well    Behavior During Therapy [facility] for tasks assessed/performed                Past Medical History:  Diagnosis Date   Arthritis    Chronic fatigue syndrome    Diverticulosis    External hemorrhoids    Hypertension    Internal hemorrhoids    Neuropathy    Spinal stenosis    Past Surgical History:  Procedure Laterality Date   CARDIAC CATHETERIZATION  09/18/2012   LEFT HEART CATHETERIZATION WITH CORONARY ANGIOGRAM N/A 09/18/2012   Procedure: LEFT HEART CATHETERIZATION WITH CORONARY ANGIOGRAM;  Surgeon: Sherren Mocha, MD;  Location: [facility] CATH LAB;  Service: Cardiovascular;  Laterality: N/A;   TONSILLECTOMY  1965   Patient Active Problem List   Diagnosis Date Noted   Arthritis of left hip 06/25/2022   Hip flexor tendon tightness, left 04/02/2022   Greater trochanteric bursitis of left hip 01/11/2022   Anxiety 07/24/2020   Nonallopathic lesion of cervical region 04/03/2020   Nonallopathic lesion of thoracic region 04/03/2020   Nonallopathic lesion of lumbar region 04/03/2020   Cervical stenosis of spine 03/31/2020   Attention deficit disorder (ADD) in adult 03/31/2020   Low bone density 03/17/2020   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[place]) 02/15/2020   GERD (gastroesophageal reflux disease) 02/15/2020   Allergic rhinitis 03/09/2016   Essential hypertension 02/18/2014   Chronic pain syndrome 02/22/2013   Chronic insomnia 02/22/2013   Paresthesias 10/25/2012   Hyperlipidemia  09/18/2012     REFERRING PROVIDER: [name], DO  REFERRING DIAG: M70.62 (ICD-10-CM) - Greater trochanteric bursitis of left hip  THERAPY DIAG:  Trochanteric bursitis, left hip  It band syndrome, left  Pain in left hip  Rationale for Evaluation and Treatment Rehabilitation  ONSET DATE: subacute on chronic  SUBJECTIVE:   SUBJECTIVE STATEMENT: Its about the same. Still a lot of pain going up driveway and cannot lift hip into flexion. Walked for 10 min with discomfort but driveway hurts to return.   PERTINENT HISTORY: LBP, low bone density PAIN:  Are you having pain? Yes: NPRS scale: 2 sitting, up stairs 5/10 Pain location: Lt leg Pain description: sharp, achey Aggravating factors: sitting, going up stairs Relieving factors: standing  PRECAUTIONS: None  WEIGHT BEARING RESTRICTIONS: No  FALLS:  Has patient fallen in last 6 months? No   OCCUPATION: psychologist  PLOF: Independent  PATIENT GOALS: decrease pain   OBJECTIVE:   DIAGNOSTIC FINDINGS:  MRI pelvis 9/0/23 IMPRESSION: 1. High-grade partial-thickness cartilage loss with areas of full-thickness cartilage loss of the left femoral head and acetabulum with mild subchondral reactive marrow edema in the superior anterior acetabulum consistent with moderate-severe osteoarthritis. 2. Partial-thickness cartilage loss of the right femoral head and acetabulum. 3. Right anterior labral tear. Left anterior labral tear with a 10 mm paralabral cyst. 4. Broad-based disc bulge at L4-5 with a central annular fissure. Mild left and moderate  right facet arthropathy at L4-5.   PALPATION: Tightness in left hip flexors, reduced spring in post rotation of Rt innom.   LOWER EXTREMITY ROM:  PROM WFL but painful, empty end feel  LOWER EXTREMITY MMT:  MMT Right eval Left eval  Hip flexion    Hip extension    Hip abduction    Hip adduction    Hip internal rotation    Hip external rotation    Knee flexion     Knee extension    Ankle dorsiflexion    Ankle plantarflexion    Ankle inversion    Ankle eversion     (Blank rows = not tested)  GAIT: Distance walked: [facility] without AD    TODAY'S TREATMENT                                                                            Treatment                            09/06/22:  STM lt hip flexors Mod thomas stretch Prone IASTM Lt HS Bil SIJ PA mobilizations Sidelying diag hip abd Sidelying clam [place] 8 min Lt gr troch bursa 100% Ionto 1cc dex, 6hr wear    Treatment                            09/03/22:  Discussion regarding daily exercises and frequent mobility Standing weight shift into heels Increased toe off in gait IASTM Lt Quads, ITB, HS Prone Lt upper quadrant SIJ PA spring mob Ionto 1cc dex, 6 hr wear   Treatment                            08/31/22:  Elliptical 5 min L8 Trial of leg press- stopped due to pain in starting position Seated hip hinge Discussed swimming exercises Seated round back- +shoulder flexion bil & alt LAQ with add Add with pelvic floor set Ionto dex 1cc 6 hr wear   Treatment                            08/27/22:  Discussion regarding MD appt IASTM Lt ITB Feet on wall bridges, knees at 90 & with knees close to exetnsion on heels Wall bridge with marching, alt; also added opp UE flexion Ionto, dex 1cc 6 hr wear    PATIENT EDUCATION:  Education details: Geophysicist/field seismologist of condition, POC, HEP, exercise form/rationale Person educated: Patient Education method: Consulting civil engineer, Demonstration, Tactile cues, Verbal cues, and Handouts Education comprehension: verbalized understanding, returned demonstration, verbal cues required, tactile cues required, and needs further education  HOME EXERCISE PROGRAM: [id number] Hip traction by standing on left foot on elevated surface  ASSESSMENT:  CLINICAL IMPRESSION: Anterior thigh catching at 50 deg of hip flexion. Cont TTP at greater trochanter bursa but so combined  modalities in attempt to decrease irritation. Denied pain with [place] hip abd strength exercises so will continue to perform gently.    OBJECTIVE IMPAIRMENTS: decreased activity tolerance, decreased ROM, increased muscle spasms, improper body mechanics, and pain.  ACTIVITY LIMITATIONS: carrying, lifting, bending, sitting, squatting, sleeping, stairs, and locomotion level  PARTICIPATION LIMITATIONS: meal prep, cleaning, laundry, driving, shopping, community activity, and occupation  PERSONAL FACTORS: 1-2 comorbidities: chronic back pain, known degeneration in hip, low bone density  are also affecting patient's functional outcome.   GOALS: Goals reviewed with patient? Yes  SHORT TERM GOALS: Target date: 08/23/2022  Determine need to change POC following surgical visit Baseline: Goal status: achieved    LONG TERM GOALS: Target date: POC date  Able to sleep without interruption by hip pain Baseline:  Goal status: INITIAL  2.  Able to sit for at least 20 min without increased hip pain Baseline:  Goal status: INITIAL  3.  Hip abd strength 90% of opp LE Baseline:  Goal status: INITIAL  4.  Able to navigate stairs with minimal pain Baseline:  Goal status: INITIAL   PLAN:  PT FREQUENCY: 1-2x/week  PT DURATION:8 weeks  PLANNED INTERVENTIONS: Therapeutic exercises, Therapeutic activity, Neuromuscular re-education, Balance training, Gait training, Patient/Family education, Self Care, Joint mobilization, Stair training, Aquatic Therapy, Dry Needling, Electrical stimulation, Spinal mobilization, Cryotherapy, Moist heat, Taping, Ionotophoresis '4mg'$ /ml Dexamethasone, Manual therapy, and Re-evaluation  PLAN FOR NEXT SESSION: DN PRN, icontinue ionto, progress core strength   Ashir Kunz C. Ingra Rother PT, DPT 09/07/22 8:58 AM

## 2022-09-10 ENCOUNTER — Encounter (HOSPITAL_BASED_OUTPATIENT_CLINIC_OR_DEPARTMENT_OTHER): Payer: Self-pay | Admitting: Physical Therapy

## 2022-09-10 ENCOUNTER — Ambulatory Visit (HOSPITAL_BASED_OUTPATIENT_CLINIC_OR_DEPARTMENT_OTHER): Payer: 59 | Admitting: Physical Therapy

## 2022-09-10 DIAGNOSIS — R262 Difficulty in walking, not elsewhere classified: Secondary | ICD-10-CM | POA: Diagnosis not present

## 2022-09-10 DIAGNOSIS — M7632 Iliotibial band syndrome, left leg: Secondary | ICD-10-CM

## 2022-09-10 DIAGNOSIS — M6281 Muscle weakness (generalized): Secondary | ICD-10-CM | POA: Diagnosis not present

## 2022-09-10 DIAGNOSIS — M25552 Pain in left hip: Secondary | ICD-10-CM

## 2022-09-10 DIAGNOSIS — M7062 Trochanteric bursitis, left hip: Secondary | ICD-10-CM

## 2022-09-10 NOTE — Therapy (Signed)
OUTPATIENT PHYSICAL THERAPY LOWER EXTREMITY TREATMENT   Patient Name: Tammy [name] MRN: [id number] DOB:31-Aug-1961, 61 y.o., female Today's Date: 09/10/2022   PT End of Session - 09/10/22 1402     Visit Number 9    Number of Visits 20    Date for PT Re-Evaluation 10/08/22    Authorization Type [place]    Authorization - Number of Visits 28    PT Start Time 1401    PT Stop Time 1440    PT Time Calculation (min) 39 min    Activity Tolerance Patient tolerated treatment well    Behavior During Therapy [facility] for tasks assessed/performed                Past Medical History:  Diagnosis Date   Arthritis    Chronic fatigue syndrome    Diverticulosis    External hemorrhoids    Hypertension    Internal hemorrhoids    Neuropathy    Spinal stenosis    Past Surgical History:  Procedure Laterality Date   CARDIAC CATHETERIZATION  09/18/2012   LEFT HEART CATHETERIZATION WITH CORONARY ANGIOGRAM N/A 09/18/2012   Procedure: LEFT HEART CATHETERIZATION WITH CORONARY ANGIOGRAM;  Surgeon: Tammy Mocha, MD;  Location: [facility] CATH LAB;  Service: Cardiovascular;  Laterality: N/A;   TONSILLECTOMY  1965   Patient Active Problem List   Diagnosis Date Noted   Arthritis of left hip 06/25/2022   Hip flexor tendon tightness, left 04/02/2022   Greater trochanteric bursitis of left hip 01/11/2022   Anxiety 07/24/2020   Nonallopathic lesion of cervical region 04/03/2020   Nonallopathic lesion of thoracic region 04/03/2020   Nonallopathic lesion of lumbar region 04/03/2020   Cervical stenosis of spine 03/31/2020   Attention deficit disorder (ADD) in adult 03/31/2020   Low bone density 03/17/2020   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[place]) 02/15/2020   GERD (gastroesophageal reflux disease) 02/15/2020   Allergic rhinitis 03/09/2016   Essential hypertension 02/18/2014   Chronic pain syndrome 02/22/2013   Chronic insomnia 02/22/2013   Paresthesias 10/25/2012   Hyperlipidemia  09/18/2012     REFERRING PROVIDER: [name], DO  REFERRING DIAG: M70.62 (ICD-10-CM) - Greater trochanteric bursitis of left hip  THERAPY DIAG:  Trochanteric bursitis, left hip  It band syndrome, left  Pain in left hip  Rationale for Evaluation and Treatment Rehabilitation  ONSET DATE: subacute on chronic  SUBJECTIVE:   SUBJECTIVE STATEMENT: I was in a lot of pain down lateral thigh. Feels best when I sit with my heel propped on a space heater. Driving is very uncomfortable, including in my knee. Sometimes do get pain down below knee  PERTINENT HISTORY: LBP, low bone density PAIN:  Are you having pain? Yes: NPRS scale: 2 sitting, up stairs 5/10 Pain location: Lt leg Pain description: sharp, achey Aggravating factors: sitting, going up stairs Relieving factors: standing  PRECAUTIONS: None  WEIGHT BEARING RESTRICTIONS: No  FALLS:  Has patient fallen in last 6 months? No   OCCUPATION: psychologist  PLOF: Independent  PATIENT GOALS: decrease pain   OBJECTIVE:   DIAGNOSTIC FINDINGS:  MRI pelvis 06/19/22 IMPRESSION: 1. High-grade partial-thickness cartilage loss with areas of full-thickness cartilage loss of the left femoral head and acetabulum with mild subchondral reactive marrow edema in the superior anterior acetabulum consistent with moderate-severe osteoarthritis. 2. Partial-thickness cartilage loss of the right femoral head and acetabulum. 3. Right anterior labral tear. Left anterior labral tear with a 10 mm paralabral cyst. 4. Broad-based disc bulge at L4-5 with  a central annular fissure. Mild left and moderate right facet arthropathy at L4-5.  MRI lumbar spine 06/19/22:  T12- L1: Unremarkable.   L1-L2: Mild disc bulging   L2-L3: Mild disc bulging and facet spurring   L3-L4: Mild disc bulging with posterior annular fissure and shallow protrusion. Patent canal and foramina   L4-L5: Facet spurring and borderline anterolisthesis. Mild  disc bulging with shallow central protrusion at an annular fissure. The protrusion is mildly regressed and spinal canal patency is improved, especially at the subarticular recesses   L5-S1:Moderate facet spurring on the right.  PALPATION: Tightness in left hip flexors, reduced spring in post rotation of Rt innom.   LOWER EXTREMITY ROM:  PROM WFL but painful, empty end feel  LOWER EXTREMITY MMT:  MMT Right eval Left eval  Hip flexion    Hip extension    Hip abduction    Hip adduction    Hip internal rotation    Hip external rotation    Knee flexion    Knee extension    Ankle dorsiflexion    Ankle plantarflexion    Ankle inversion    Ankle eversion     (Blank rows = not tested)  GAIT: Distance walked: [facility] without AD    TODAY'S TREATMENT                                                                            Treatment                            09/10/22:  Sustained prone on elbows, sustained DKTC Hooklying post pelvic tilt Hooklying march Seated flexion- created distal symptoms Standing elbows on counter in hip hinge Stairs- glut set upon ascending, increase speed   Treatment                            09/06/22:  STM lt hip flexors Mod thomas stretch Prone IASTM Lt HS Bil SIJ PA mobilizations Sidelying diag hip abd Sidelying clam [place] 8 min Lt gr troch bursa 100% Ionto 1cc dex, 6hr wear    Treatment                            09/03/22:  Discussion regarding daily exercises and frequent mobility Standing weight shift into heels Increased toe off in gait IASTM Lt Quads, ITB, HS Prone Lt upper quadrant SIJ PA spring mob Ionto 1cc dex, 6 hr wear    PATIENT EDUCATION:  Education details: Geophysicist/field seismologist of condition, POC, HEP, exercise form/rationale Person educated: Patient Education method: Consulting civil engineer, Demonstration, Tactile cues, Verbal cues, and Handouts Education comprehension: verbalized understanding, returned demonstration, verbal cues required,  tactile cues required, and needs further education  HOME EXERCISE PROGRAM: [id number] Hip traction by standing on left foot on elevated surface [id number]- lumbar flexion program  ASSESSMENT:  CLINICAL IMPRESSION: Pt continues to have high levels of pain in leg so focus was switched to trial attention to lumbar spine. MRI reveals some changes and is enough, paired with symptoms, to consider trial of a flexion based program. Sustained prone laying resulted in  tingling in left foot today. Asked that she use a small lumbar support when in car and to obtain a flexion position in hooklying or elbows on counter with any onset of distal symptoms to determine if the lumbar spine is contributing to hip pain. Was able to ascend stairs with less pain/difficulty with increased speed and glut activation today. Placed 5th dose of ionto.    OBJECTIVE IMPAIRMENTS: decreased activity tolerance, decreased ROM, increased muscle spasms, improper body mechanics, and pain.   ACTIVITY LIMITATIONS: carrying, lifting, bending, sitting, squatting, sleeping, stairs, and locomotion level  PARTICIPATION LIMITATIONS: meal prep, cleaning, laundry, driving, shopping, community activity, and occupation  PERSONAL FACTORS: 1-2 comorbidities: chronic back pain, known degeneration in hip, low bone density  are also affecting patient's functional outcome.   GOALS: Goals reviewed with patient? Yes  SHORT TERM GOALS: Target date: 08/23/2022  Determine need to change POC following surgical visit Baseline: Goal status: achieved    LONG TERM GOALS: Target date: POC date  Able to sleep without interruption by hip pain Baseline:  Goal status: INITIAL  2.  Able to sit for at least 20 min without increased hip pain Baseline:  Goal status: INITIAL  3.  Hip abd strength 90% of opp LE Baseline:  Goal status: INITIAL  4.  Able to navigate stairs with minimal pain Baseline:  Goal status: INITIAL   PLAN:  PT FREQUENCY:  1-2x/week  PT DURATION:8 weeks  PLANNED INTERVENTIONS: Therapeutic exercises, Therapeutic activity, Neuromuscular re-education, Balance training, Gait training, Patient/Family education, Self Care, Joint mobilization, Stair training, Aquatic Therapy, Dry Needling, Electrical stimulation, Spinal mobilization, Cryotherapy, Moist heat, Taping, Ionotophoresis '4mg'$ /ml Dexamethasone, Manual therapy, and Re-evaluation  PLAN FOR NEXT SESSION: DN PRN, 1 more dose of ionto, outcome of flexion program?   Jancarlo Biermann C. Ger Ringenberg PT, DPT 09/10/22 2:58 PM

## 2022-09-13 ENCOUNTER — Encounter (HOSPITAL_BASED_OUTPATIENT_CLINIC_OR_DEPARTMENT_OTHER): Payer: Self-pay | Admitting: Physical Therapy

## 2022-09-14 ENCOUNTER — Ambulatory Visit (HOSPITAL_BASED_OUTPATIENT_CLINIC_OR_DEPARTMENT_OTHER): Payer: 59 | Admitting: Physical Therapy

## 2022-09-15 DIAGNOSIS — Z8639 Personal history of other endocrine, nutritional and metabolic disease: Secondary | ICD-10-CM | POA: Diagnosis not present

## 2022-09-15 DIAGNOSIS — Z6826 Body mass index (BMI) 26.0-26.9, adult: Secondary | ICD-10-CM | POA: Diagnosis not present

## 2022-09-15 DIAGNOSIS — R632 Polyphagia: Secondary | ICD-10-CM | POA: Diagnosis not present

## 2022-09-15 DIAGNOSIS — E663 Overweight: Secondary | ICD-10-CM | POA: Diagnosis not present

## 2022-09-15 NOTE — Progress Notes (Signed)
Tammy [name] [place] Phone: [phone] Subjective:    I'm seeing this patient by the request  of:  [name], MD  CC: Left hip pain  GGY:IRSWNIOEVO  06/25/2022 Patient does have arthritis of the left hip.  It is fairly severe at this moment.  We did discuss different treatment options but I did discuss with patient that I do feel that it is more beneficial for patient to consider surgical replacement.  Patient is stating that she is having more and more discomfort even in her knee and her back is secondary to compensating for this hip.  Patient will be referred today to further discuss surgical intervention and will follow-up with me again as needed     Update 09/17/2022 Tammy [name] is a 61 y.o. female coming in with complaint of L hip pain. Patient states that she is still having some problem with L hip and leg, no hip replacement back in PT , surgeon thinks its bursitis and IT band syndrome, received CSI from surgeon ([place]) in the greater trochanteric area that didn't help, different surgeon thinks its glute tear , received another CSI ([place]) and that didn't help either, she is still having the same pain,  pain with sitting and driving radiating down her whole leg , can't walk up stairs  Patient is able to do the water aerobics but not daily activities including sitting causes some more radicular symptoms at this time.  Patient feels like it is worsening overall.  He is very frustrated because she used to be running significant amounts.  Going up and down stairs without any difficulty and now is unable to do those at all.  Even regular daily activities even with walking has caused more discomfort and pain.      Past Medical History:  Diagnosis Date   Arthritis    Chronic fatigue syndrome    Diverticulosis    External hemorrhoids    Hypertension    Internal hemorrhoids    Neuropathy    Spinal stenosis     Past Surgical History:  Procedure Laterality Date   CARDIAC CATHETERIZATION  09/18/2012   LEFT HEART CATHETERIZATION WITH CORONARY ANGIOGRAM N/A 09/18/2012   Procedure: LEFT HEART CATHETERIZATION WITH CORONARY ANGIOGRAM;  Surgeon: Sherren Mocha, MD;  Location: [facility] CATH LAB;  Service: Cardiovascular;  Laterality: N/A;   TONSILLECTOMY  1965   Social History   Socioeconomic History   Marital status: Married    Spouse name: Not on file   Number of children: 2   Years of education: Not on file   Highest education level: Not on file  Occupational History   Occupation: Therapist    Employer: [employer]  Tobacco Use   Smoking status: Never   Smokeless tobacco: Never  Substance and Sexual Activity   Alcohol use: No   Drug use: No   Sexual activity: Yes  Other Topics Concern   Not on file  Social History Narrative   Married. 2 adopted children- adopted daughter (25) when single after first marriage from [place]. Son from [place] with 2nd husband(18) in 2021. 1st husband local physician Linus Mako      LCSW with [employer]      Hobbies: animal rescue- has litter of kittens in 2021, walking, time outside   Social Determinants of Health   Financial Resource Strain: Not on file  Food Insecurity: Not on file  Transportation Needs: Not on file  Physical Activity:  Not on file  Stress: Not on file  Social Connections: Not on file   Allergies  Allergen Reactions   Eggs Or Egg-Derived Products Other (See Comments)    Makes pt feel like burping up rotten eggs   Family History  Problem Relation Age of Onset   Heart attack Maternal Grandmother 92   Arthritis Maternal Grandmother    Hyperlipidemia Maternal Grandmother    Heart disease Maternal Grandmother    Diabetes Maternal Grandmother    Pancreatic cancer Mother        died 55. 2.5 years past diagnosis.     Anxiety disorder Mother    Depression Mother    Colon polyps Father    Healthy Sister    Healthy Brother     Depression Brother    Heart attack Brother        age 68   Suicidality Maternal Grandfather    Colon cancer Neg Hx    Esophageal cancer Neg Hx    Kidney disease Neg Hx      Current Outpatient Medications (Cardiovascular):    hydrochlorothiazide (HYDRODIURIL) 25 MG tablet, Take 1 tablet (25 mg total) by mouth daily.   Current Outpatient Medications (Analgesics):    ACETAMINOPHEN-CAFFEINE PO, Take by mouth in the morning.   Current Outpatient Medications (Other):    ARIPiprazole (ABILIFY) 2 MG tablet, Take 1/2 to 1 tablet by mouth once daily.   COVID-19 mRNA vaccine 2023-2024 (COMIRNATY) syringe, Inject into the muscle.   FLUoxetine (PROZAC) 10 MG capsule, Take 3 capsules (30 mg total) by mouth daily.   lisdexamfetamine (VYVANSE) 20 MG capsule, Take 1 capsule by mouth daily.   lisdexamfetamine (VYVANSE) 20 MG capsule, Take 1 capsule (20 mg total) by mouth daily.   MAGNESIUM CHLORIDE-CALCIUM PO, Take by mouth.   Melatonin 3 MG CAPS, Take 6 mg by mouth at bedtime.   ondansetron (ZOFRAN-ODT) 4 MG disintegrating tablet, Dissolve 1 tablet on the tongue every 8 hours as needed   pantoprazole (PROTONIX) 20 MG tablet, Take 1 tablet by mouth daily.   Probiotic Product (PROBIOTIC DAILY PO), Take by mouth.   Semaglutide-Weight Management (WEGOVY) 0.25 MG/0.5ML SOAJ, Inject 0.25 mg   Semaglutide-Weight Management (WEGOVY) 0.5 MG/0.5ML SOAJ, Inject 0.5 mg subcutaneously as directed   Semaglutide-Weight Management (WEGOVY) 1 MG/0.5ML SOAJ, Inject 1 mg into the skin once a week.   Semaglutide-Weight Management (WEGOVY) 1.7 MG/0.75ML SOAJ, Inject 1 pen (1.7 MG) into the skin once a week   tirzepatide (ZEPBOUND) 2.5 MG/0.5ML Pen, Inject 2.5 mg into the skin once a week.   clonazePAM (KLONOPIN) 0.5 MG tablet, Take 1 tablet (0.5 mg total) by mouth daily as needed.   Reviewed prior external information including notes and imaging from  primary care provider As well as notes that were available from  care everywhere and other healthcare systems.  Past medical history, social, surgical and family history all reviewed in electronic medical record.  No pertanent information unless stated regarding to the chief complaint.   Review of Systems:  No headache, visual changes, nausea, vomiting, diarrhea, constipation, dizziness, abdominal pain, skin rash, fevers, chills, night sweats, weight loss, swollen lymph nodes,  joint swelling, chest pain, shortness of breath, mood changes. POSITIVE muscle aches, body aches  Objective  Blood pressure 110/86, pulse 88, height '5\' 1"'$  (1.549 m), weight 149 lb (67.6 kg), last menstrual period 11/05/2007, SpO2 100 %.   General: No apparent distress alert and oriented x3 mood and affect normal, dressed appropriately.  HEENT: Pupils equal,  extraocular movements intact  Respiratory: Patient's speak in full sentences and does not appear short of breath  Cardiovascular: No lower extremity edema, non tender, no erythema  Antalgic gait noted favoring the left hip.  Does have some range of motion with pain in the right knee to the lateral aspect of the hip.  Nontender over the greater trochanteric area.  Does have tightness with FABER test.  Positive impingement noted in the femoral acetabular area.    Impression and Recommendations:     The above documentation has been reviewed and is accurate and complete [name], DO

## 2022-09-16 ENCOUNTER — Encounter (HOSPITAL_BASED_OUTPATIENT_CLINIC_OR_DEPARTMENT_OTHER): Payer: Self-pay | Admitting: Physical Therapy

## 2022-09-16 ENCOUNTER — Ambulatory Visit (HOSPITAL_BASED_OUTPATIENT_CLINIC_OR_DEPARTMENT_OTHER): Payer: 59 | Admitting: Physical Therapy

## 2022-09-16 DIAGNOSIS — R262 Difficulty in walking, not elsewhere classified: Secondary | ICD-10-CM | POA: Diagnosis not present

## 2022-09-16 DIAGNOSIS — M7062 Trochanteric bursitis, left hip: Secondary | ICD-10-CM | POA: Diagnosis not present

## 2022-09-16 DIAGNOSIS — F332 Major depressive disorder, recurrent severe without psychotic features: Secondary | ICD-10-CM | POA: Diagnosis not present

## 2022-09-16 DIAGNOSIS — M7632 Iliotibial band syndrome, left leg: Secondary | ICD-10-CM | POA: Diagnosis not present

## 2022-09-16 DIAGNOSIS — M25552 Pain in left hip: Secondary | ICD-10-CM | POA: Diagnosis not present

## 2022-09-16 DIAGNOSIS — F411 Generalized anxiety disorder: Secondary | ICD-10-CM | POA: Diagnosis not present

## 2022-09-16 DIAGNOSIS — M6281 Muscle weakness (generalized): Secondary | ICD-10-CM | POA: Diagnosis not present

## 2022-09-16 NOTE — Therapy (Signed)
OUTPATIENT PHYSICAL THERAPY LOWER EXTREMITY TREATMENT   Patient Name: Tammy [name] MRN: [id number] DOB:[birth year], 61 y.o., female Today's Date: 09/16/2022   PT End of Session - 09/16/22 1435     Visit Number 10    Number of Visits 20    Date for PT Re-Evaluation 10/08/22    Authorization Type [place]    Authorization - Number of Visits 28    PT Start Time 1430    PT Stop Time 1510    PT Time Calculation (min) 40 min    Activity Tolerance Patient tolerated treatment well    Behavior During Therapy [facility] for tasks assessed/performed                 Past Medical History:  Diagnosis Date   Arthritis    Chronic fatigue syndrome    Diverticulosis    External hemorrhoids    Hypertension    Internal hemorrhoids    Neuropathy    Spinal stenosis    Past Surgical History:  Procedure Laterality Date   CARDIAC CATHETERIZATION  09/18/2012   LEFT HEART CATHETERIZATION WITH CORONARY ANGIOGRAM N/A 09/18/2012   Procedure: LEFT HEART CATHETERIZATION WITH CORONARY ANGIOGRAM;  Surgeon: Sherren Mocha, MD;  Location: [facility] CATH LAB;  Service: Cardiovascular;  Laterality: N/A;   TONSILLECTOMY  1965   Patient Active Problem List   Diagnosis Date Noted   Arthritis of left hip 06/25/2022   Hip flexor tendon tightness, left 04/02/2022   Greater trochanteric bursitis of left hip 01/11/2022   Anxiety 07/24/2020   Nonallopathic lesion of cervical region 04/03/2020   Nonallopathic lesion of thoracic region 04/03/2020   Nonallopathic lesion of lumbar region 04/03/2020   Cervical stenosis of spine 03/31/2020   Attention deficit disorder (ADD) in adult 03/31/2020   Low bone density 03/17/2020   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[place]) 02/15/2020   GERD (gastroesophageal reflux disease) 02/15/2020   Allergic rhinitis 03/09/2016   Essential hypertension 02/18/2014   Chronic pain syndrome 02/22/2013   Chronic insomnia 02/22/2013   Paresthesias 10/25/2012   Hyperlipidemia  09/18/2012     REFERRING PROVIDER: [name], DO  REFERRING DIAG: I96.78 (ICD-10-CM) - Greater trochanteric bursitis of left hip  THERAPY DIAG:  Trochanteric bursitis, left hip  It band syndrome, left  Pain in left hip  Difficulty in walking, not elsewhere classified  Rationale for Evaluation and Treatment Rehabilitation  ONSET DATE: subacute on chronic  SUBJECTIVE:   SUBJECTIVE STATEMENT: I think the flexion helped with some of the nerve pain but still have the "ouch" down the lateral thigh.   PERTINENT HISTORY: LBP, low bone density PAIN:  Are you having pain? Yes: NPRS scale: 2 sitting, up stairs 5/10 Pain location: Lt leg Pain description: sharp, achey Aggravating factors: sitting, going up stairs Relieving factors: standing  PRECAUTIONS: None  WEIGHT BEARING RESTRICTIONS: No  FALLS:  Has patient fallen in last 6 months? No   OCCUPATION: psychologist  PLOF: Independent  PATIENT GOALS: decrease pain   OBJECTIVE:   DIAGNOSTIC FINDINGS:  MRI pelvis 06/19/22 IMPRESSION: 1. High-grade partial-thickness cartilage loss with areas of full-thickness cartilage loss of the left femoral head and acetabulum with mild subchondral reactive marrow edema in the superior anterior acetabulum consistent with moderate-severe osteoarthritis. 2. Partial-thickness cartilage loss of the right femoral head and acetabulum. 3. Right anterior labral tear. Left anterior labral tear with a 10 mm paralabral cyst. 4. Broad-based disc bulge at L4-5 with a central annular fissure. Mild left and moderate right  facet arthropathy at L4-5.  MRI lumbar spine 06/19/22:  T12- L1: Unremarkable.   L1-L2: Mild disc bulging   L2-L3: Mild disc bulging and facet spurring   L3-L4: Mild disc bulging with posterior annular fissure and shallow protrusion. Patent canal and foramina   L4-L5: Facet spurring and borderline anterolisthesis. Mild disc bulging with shallow central  protrusion at an annular fissure. The protrusion is mildly regressed and spinal canal patency is improved, especially at the subarticular recesses   L5-S1:Moderate facet spurring on the right.  PALPATION: Tightness in left hip flexors, reduced spring in post rotation of Rt innom.   LOWER EXTREMITY ROM:  PROM WFL but painful, empty end feel  LOWER EXTREMITY MMT:  MMT Right eval Left eval  Hip flexion    Hip extension    Hip abduction    Hip adduction    Hip internal rotation    Hip external rotation    Knee flexion    Knee extension    Ankle dorsiflexion    Ankle plantarflexion    Ankle inversion    Ankle eversion     (Blank rows = not tested)  GAIT: Distance walked: [facility] without AD    TODAY'S TREATMENT                                                                            Treatment                            09/16/22:  Trigger Point Dry Needling, Manual Therapy Treatment:  Initial or subsequent education regarding Trigger Point Dry Needling: Subsequent Did patient give consent to treatment with Trigger Point Dry Needling: Yes TPDN with skilled palpation and monitoring followed by STM to the following muscles: Lt VL, piriformis, glut med/min, TFL  Gait- heel strike Stair pattern with glut activation/quad eccentric control Gastroc stretch   Treatment                            09/10/22:  Sustained prone on elbows, sustained DKTC Hooklying post pelvic tilt Hooklying march Seated flexion- created distal symptoms Standing elbows on counter in hip hinge Stairs- glut set upon ascending, increase speed   Treatment                            09/06/22:  STM lt hip flexors Mod thomas stretch Prone IASTM Lt HS Bil SIJ PA mobilizations Sidelying diag hip abd Sidelying clam [place] 8 min Lt gr troch bursa 100% Ionto 1cc dex, 6hr wear     PATIENT EDUCATION:  Education details: Geophysicist/field seismologist of condition, POC, HEP, exercise form/rationale Person educated:  Patient Education method: Consulting civil engineer, Demonstration, Tactile cues, Verbal cues, and Handouts Education comprehension: verbalized understanding, returned demonstration, verbal cues required, tactile cues required, and needs further education  HOME EXERCISE PROGRAM: [id number] Hip traction by standing on left foot on elevated surface [id number]- lumbar flexion program  ASSESSMENT:  CLINICAL IMPRESSION: Notable heel whip and pt reports chronic calf tightness. Will add this stretch to her HEP on a daily basis. Added DN to quads which improved  ease of ascending stairs. Added trunk flexion to hip flexion when ascending stairs to improve core contribution.    OBJECTIVE IMPAIRMENTS: decreased activity tolerance, decreased ROM, increased muscle spasms, improper body mechanics, and pain.   ACTIVITY LIMITATIONS: carrying, lifting, bending, sitting, squatting, sleeping, stairs, and locomotion level  PARTICIPATION LIMITATIONS: meal prep, cleaning, laundry, driving, shopping, community activity, and occupation  PERSONAL FACTORS: 1-2 comorbidities: chronic back pain, known degeneration in hip, low bone density  are also affecting patient's functional outcome.   GOALS: Goals reviewed with patient? Yes  SHORT TERM GOALS: Target date: 08/23/2022  Determine need to change POC following surgical visit Baseline: Goal status: achieved    LONG TERM GOALS: Target date: POC date  Able to sleep without interruption by hip pain Baseline:  Goal status: INITIAL  2.  Able to sit for at least 20 min without increased hip pain Baseline:  Goal status: INITIAL  3.  Hip abd strength 90% of opp LE Baseline:  Goal status: INITIAL  4.  Able to navigate stairs with minimal pain Baseline:  Goal status: INITIAL   PLAN:  PT FREQUENCY: 1-2x/week  PT DURATION:8 weeks  PLANNED INTERVENTIONS: Therapeutic exercises, Therapeutic activity, Neuromuscular re-education, Balance training, Gait training,  Patient/Family education, Self Care, Joint mobilization, Stair training, Aquatic Therapy, Dry Needling, Electrical stimulation, Spinal mobilization, Cryotherapy, Moist heat, Taping, Ionotophoresis '4mg'$ /ml Dexamethasone, Manual therapy, and Re-evaluation  PLAN FOR NEXT SESSION: DN PRN, 1 more dose of ionto, continue flexion program   Jahmil Macleod C. Keshonna Valvo PT, DPT 09/16/22 5:36 PM

## 2022-09-17 ENCOUNTER — Encounter: Payer: Self-pay | Admitting: Family Medicine

## 2022-09-17 ENCOUNTER — Ambulatory Visit (INDEPENDENT_AMBULATORY_CARE_PROVIDER_SITE_OTHER): Payer: 59 | Admitting: Family Medicine

## 2022-09-17 ENCOUNTER — Ambulatory Visit: Payer: Self-pay

## 2022-09-17 ENCOUNTER — Other Ambulatory Visit (HOSPITAL_COMMUNITY): Payer: Self-pay

## 2022-09-17 VITALS — BP 110/86 | HR 88 | Ht 61.0 in | Wt 149.0 lb

## 2022-09-17 DIAGNOSIS — M7062 Trochanteric bursitis, left hip: Secondary | ICD-10-CM | POA: Diagnosis not present

## 2022-09-17 DIAGNOSIS — M999 Biomechanical lesion, unspecified: Secondary | ICD-10-CM

## 2022-09-17 DIAGNOSIS — M1612 Unilateral primary osteoarthritis, left hip: Secondary | ICD-10-CM | POA: Diagnosis not present

## 2022-09-17 DIAGNOSIS — M9903 Segmental and somatic dysfunction of lumbar region: Secondary | ICD-10-CM | POA: Diagnosis not present

## 2022-09-17 NOTE — Assessment & Plan Note (Addendum)
Patient does have arthritis of the hip noted.  MSK seem to be more on the anterior lateral aspect that is consistent with her pain.  Discussed with patient that patient not responding to greater trochanteric injection or the gluteal injection increases the likelihood that this is intra-articular hip pathology.  Differential does include possibly some lumbar radiculopathy and we could consider a potential epidural with mild spinal stenosis.  We gave patient the option of a possible intra-articular hip injection but she is likely would like to talk to the orthopedic surgeon first to see if this is necessary.  Discussed with her she can either have it done there or in our office if she would rather.  This will be a diagnostic and potentially therapeutic can help [place] with the long term planning.  We also did put in for a epidural in case she would like to proceed and this went to to help [place] with diagnosis.  Total time with patient 32 minutes

## 2022-09-17 NOTE — Assessment & Plan Note (Signed)
Did not respond to note to injections in this area

## 2022-09-17 NOTE — Patient Instructions (Signed)
Epidural L5-S1  Talk with Dr. Ninfa Linden see what he thinks of an interarticular injection We are here if you have any questions

## 2022-09-21 ENCOUNTER — Encounter (HOSPITAL_BASED_OUTPATIENT_CLINIC_OR_DEPARTMENT_OTHER): Payer: Self-pay | Admitting: Physical Therapy

## 2022-09-21 ENCOUNTER — Other Ambulatory Visit (HOSPITAL_COMMUNITY): Payer: Self-pay

## 2022-09-21 ENCOUNTER — Ambulatory Visit (HOSPITAL_BASED_OUTPATIENT_CLINIC_OR_DEPARTMENT_OTHER): Payer: 59 | Attending: Family Medicine | Admitting: Physical Therapy

## 2022-09-21 DIAGNOSIS — M25552 Pain in left hip: Secondary | ICD-10-CM | POA: Insufficient documentation

## 2022-09-21 DIAGNOSIS — M7632 Iliotibial band syndrome, left leg: Secondary | ICD-10-CM | POA: Diagnosis not present

## 2022-09-21 DIAGNOSIS — R262 Difficulty in walking, not elsewhere classified: Secondary | ICD-10-CM | POA: Insufficient documentation

## 2022-09-21 NOTE — Therapy (Signed)
OUTPATIENT PHYSICAL THERAPY LOWER EXTREMITY TREATMENT   Patient Name: Tammy [name] MRN: [id number] DOB:1961-01-31, 62 y.o., female Today's Date: 09/21/2022   PT End of Session - 09/21/22 0803     Visit Number 11    Number of Visits 20    Date for PT Re-Evaluation 10/08/22    Authorization Type [place]    Authorization - Number of Visits 28    PT Start Time 0800    PT Stop Time 0845    PT Time Calculation (min) 45 min    Activity Tolerance Patient tolerated treatment well    Behavior During Therapy [facility] for tasks assessed/performed                 Past Medical History:  Diagnosis Date   Arthritis    Chronic fatigue syndrome    Diverticulosis    External hemorrhoids    Hypertension    Internal hemorrhoids    Neuropathy    Spinal stenosis    Past Surgical History:  Procedure Laterality Date   CARDIAC CATHETERIZATION  09/18/2012   LEFT HEART CATHETERIZATION WITH CORONARY ANGIOGRAM N/A 09/18/2012   Procedure: LEFT HEART CATHETERIZATION WITH CORONARY ANGIOGRAM;  Surgeon: Sherren Mocha, MD;  Location: [facility] CATH LAB;  Service: Cardiovascular;  Laterality: N/A;   TONSILLECTOMY  1965   Patient Active Problem List   Diagnosis Date Noted   Arthritis of left hip 06/25/2022   Hip flexor tendon tightness, left 04/02/2022   Greater trochanteric bursitis of left hip 01/11/2022   Anxiety 07/24/2020   Nonallopathic lesion of cervical region 04/03/2020   Nonallopathic lesion of thoracic region 04/03/2020   Nonallopathic lesion of lumbar region 04/03/2020   Cervical stenosis of spine 03/31/2020   Attention deficit disorder (ADD) in adult 03/31/2020   Low bone density 03/17/2020   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[place]) 02/15/2020   GERD (gastroesophageal reflux disease) 02/15/2020   Allergic rhinitis 03/09/2016   Essential hypertension 02/18/2014   Chronic pain syndrome 02/22/2013   Chronic insomnia 02/22/2013   Paresthesias 10/25/2012   Hyperlipidemia  09/18/2012     REFERRING PROVIDER: [name], DO  REFERRING DIAG: M70.62 (ICD-10-CM) - Greater trochanteric bursitis of left hip  THERAPY DIAG:  It band syndrome, left  Pain in left hip  Difficulty in walking, not elsewhere classified  Rationale for Evaluation and Treatment Rehabilitation  ONSET DATE: subacute on chronic  SUBJECTIVE:   SUBJECTIVE STATEMENT: It was feeling a little better but then I think I sat a little too long so its a little tight now.   PERTINENT HISTORY: LBP, low bone density PAIN:  Are you having pain? Yes: NPRS scale: 2 sitting, up stairs 5/10 Pain location: Lt leg Pain description: sharp, achey Aggravating factors: sitting, going up stairs Relieving factors: standing  PRECAUTIONS: None  WEIGHT BEARING RESTRICTIONS: No  FALLS:  Has patient fallen in last 6 months? No   OCCUPATION: psychologist  PLOF: Independent  PATIENT GOALS: decrease pain   OBJECTIVE:   DIAGNOSTIC FINDINGS:  MRI pelvis 06/19/22 IMPRESSION: 1. High-grade partial-thickness cartilage loss with areas of full-thickness cartilage loss of the left femoral head and acetabulum with mild subchondral reactive marrow edema in the superior anterior acetabulum consistent with moderate-severe osteoarthritis. 2. Partial-thickness cartilage loss of the right femoral head and acetabulum. 3. Right anterior labral tear. Left anterior labral tear with a 10 mm paralabral cyst. 4. Broad-based disc bulge at L4-5 with a central annular fissure. Mild left and moderate right facet arthropathy at  L4-5.  MRI lumbar spine 06/19/22:  T12- L1: Unremarkable.   L1-L2: Mild disc bulging   L2-L3: Mild disc bulging and facet spurring   L3-L4: Mild disc bulging with posterior annular fissure and shallow protrusion. Patent canal and foramina   L4-L5: Facet spurring and borderline anterolisthesis. Mild disc bulging with shallow central protrusion at an annular fissure.  The protrusion is mildly regressed and spinal canal patency is improved, especially at the subarticular recesses   L5-S1:Moderate facet spurring on the right.  PALPATION: Tightness in left hip flexors, reduced spring in post rotation of Rt innom.   LOWER EXTREMITY ROM:  PROM WFL but painful, empty end feel  LOWER EXTREMITY MMT:  MMT Right eval Left eval  Hip flexion    Hip extension    Hip abduction    Hip adduction    Hip internal rotation    Hip external rotation    Knee flexion    Knee extension    Ankle dorsiflexion    Ankle plantarflexion    Ankle inversion    Ankle eversion     (Blank rows = not tested)  GAIT: 09/21/22: heel whip bil with lateral to medial heel strike    TODAY'S TREATMENT                                                                            Treatment                            09/21/22:  Trigger Point Dry Needling, Manual Therapy Treatment:  Initial or subsequent education regarding Trigger Point Dry Needling: Subsequent Did patient give consent to treatment with Trigger Point Dry Needling: Yes TPDN with skilled palpation and monitoring followed by STM to the following muscles: Lt VL, biceps femoris, TFL, piriformis  Gait with heel strike, retro stepping with slow/eccentric heel lower Heel stretch on steps Discussion regarding diagnoses and options for conservative approach via PT   Treatment                            09/16/22:  Trigger Point Dry Needling, Manual Therapy Treatment:  Initial or subsequent education regarding Trigger Point Dry Needling: Subsequent Did patient give consent to treatment with Trigger Point Dry Needling: Yes TPDN with skilled palpation and monitoring followed by STM to the following muscles: Lt VL, piriformis, glut med/min, TFL  Gait- heel strike Stair pattern with glut activation/quad eccentric control Gastroc stretch   Treatment                            09/10/22:  Sustained prone on elbows,  sustained DKTC Hooklying post pelvic tilt Hooklying march Seated flexion- created distal symptoms Standing elbows on counter in hip hinge Stairs- glut set upon ascending, increase speed     PATIENT EDUCATION:  Education details: Anatomy of condition, POC, HEP, exercise form/rationale Person educated: Patient Education method: Explanation, Demonstration, Tactile cues, Verbal cues, and Handouts Education comprehension: verbalized understanding, returned demonstration, verbal cues required, tactile cues required, and needs further education  HOME EXERCISE PROGRAM: [id number] Hip traction by  standing on left foot on elevated surface [id number]- lumbar flexion program  ASSESSMENT:  CLINICAL IMPRESSION: Repeat of last visit to DN excessive tension in quads, HS and hip on the left side with stretches/exercises to focus on post chain length and reducing pull into resting lumbar hyperextension. Pt denied pain at the end of the session and will continue a flexion bias program  as it shows to be appropriate.    OBJECTIVE IMPAIRMENTS: decreased activity tolerance, decreased ROM, increased muscle spasms, improper body mechanics, and pain.   ACTIVITY LIMITATIONS: carrying, lifting, bending, sitting, squatting, sleeping, stairs, and locomotion level  PARTICIPATION LIMITATIONS: meal prep, cleaning, laundry, driving, shopping, community activity, and occupation  PERSONAL FACTORS: 1-2 comorbidities: chronic back pain, known degeneration in hip, low bone density  are also affecting patient's functional outcome.   GOALS: Goals reviewed with patient? Yes  SHORT TERM GOALS: Target date: 08/23/2022  Determine need to change POC following surgical visit Baseline: Goal status: achieved    LONG TERM GOALS: Target date: POC date  Able to sleep without interruption by hip pain Baseline:  Goal status: INITIAL  2.  Able to sit for at least 20 min without increased hip pain Baseline:  Goal status:  INITIAL  3.  Hip abd strength 90% of opp LE Baseline:  Goal status: INITIAL  4.  Able to navigate stairs with minimal pain Baseline:  Goal status: INITIAL   PLAN:  PT FREQUENCY: 1-2x/week  PT DURATION:8 weeks  PLANNED INTERVENTIONS: Therapeutic exercises, Therapeutic activity, Neuromuscular re-education, Balance training, Gait training, Patient/Family education, Self Care, Joint mobilization, Stair training, Aquatic Therapy, Dry Needling, Electrical stimulation, Spinal mobilization, Cryotherapy, Moist heat, Taping, Ionotophoresis '4mg'$ /ml Dexamethasone, Manual therapy, and Re-evaluation  PLAN FOR NEXT SESSION: DN PRN, 1 more dose of ionto, continue flexion program   Montrez [place] C. Gianina Olinde PT, DPT 09/21/22 8:58 AM

## 2022-09-22 ENCOUNTER — Encounter (HOSPITAL_COMMUNITY): Payer: Self-pay

## 2022-09-22 ENCOUNTER — Other Ambulatory Visit (HOSPITAL_COMMUNITY): Payer: Self-pay

## 2022-09-23 ENCOUNTER — Other Ambulatory Visit (HOSPITAL_COMMUNITY): Payer: Self-pay

## 2022-09-24 ENCOUNTER — Ambulatory Visit (HOSPITAL_BASED_OUTPATIENT_CLINIC_OR_DEPARTMENT_OTHER): Payer: 59 | Admitting: Physical Therapy

## 2022-09-24 ENCOUNTER — Encounter (HOSPITAL_BASED_OUTPATIENT_CLINIC_OR_DEPARTMENT_OTHER): Payer: Self-pay | Admitting: Physical Therapy

## 2022-09-24 DIAGNOSIS — M7632 Iliotibial band syndrome, left leg: Secondary | ICD-10-CM | POA: Diagnosis not present

## 2022-09-24 DIAGNOSIS — M25552 Pain in left hip: Secondary | ICD-10-CM

## 2022-09-24 DIAGNOSIS — R262 Difficulty in walking, not elsewhere classified: Secondary | ICD-10-CM

## 2022-09-24 NOTE — Therapy (Signed)
OUTPATIENT PHYSICAL THERAPY LOWER EXTREMITY TREATMENT   Patient Name: Tammy [name] MRN: [id number] DOB:07/15/1961, 62 y.o., female Today's Date: 09/24/2022   PT End of Session - 09/24/22 0757     Visit Number 12    Number of Visits 20    Date for PT Re-Evaluation 10/08/22    Authorization Type [facility]    Authorization - Number of Visits 28    PT Start Time 0800    PT Stop Time 0839    PT Time Calculation (min) 39 min    Activity Tolerance Patient tolerated treatment well    Behavior During Therapy [facility] for tasks assessed/performed                 Past Medical History:  Diagnosis Date   Arthritis    Chronic fatigue syndrome    Diverticulosis    External hemorrhoids    Hypertension    Internal hemorrhoids    Neuropathy    Spinal stenosis    Past Surgical History:  Procedure Laterality Date   CARDIAC CATHETERIZATION  09/18/2012   LEFT HEART CATHETERIZATION WITH CORONARY ANGIOGRAM N/A 09/18/2012   Procedure: LEFT HEART CATHETERIZATION WITH CORONARY ANGIOGRAM;  Surgeon: Sherren Mocha, MD;  Location: [facility] CATH LAB;  Service: Cardiovascular;  Laterality: N/A;   TONSILLECTOMY  1965   Patient Active Problem List   Diagnosis Date Noted   Arthritis of left hip 06/25/2022   Hip flexor tendon tightness, left 04/02/2022   Greater trochanteric bursitis of left hip 01/11/2022   Anxiety 07/24/2020   Nonallopathic lesion of cervical region 04/03/2020   Nonallopathic lesion of thoracic region 04/03/2020   Nonallopathic lesion of lumbar region 04/03/2020   Cervical stenosis of spine 03/31/2020   Attention deficit disorder (ADD) in adult 03/31/2020   Low bone density 03/17/2020   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[place]) 02/15/2020   GERD (gastroesophageal reflux disease) 02/15/2020   Allergic rhinitis 03/09/2016   Essential hypertension 02/18/2014   Chronic pain syndrome 02/22/2013   Chronic insomnia 02/22/2013   Paresthesias 10/25/2012   Hyperlipidemia  09/18/2012     REFERRING PROVIDER: [name], DO  REFERRING DIAG: M70.62 (ICD-10-CM) - Greater trochanteric bursitis of left hip  THERAPY DIAG:  It band syndrome, left  Pain in left hip  Difficulty in walking, not elsewhere classified  Rationale for Evaluation and Treatment Rehabilitation  ONSET DATE: subacute on chronic  SUBJECTIVE:   SUBJECTIVE STATEMENT: Its about the same. Sitting is still the worst.   PERTINENT HISTORY: LBP, low bone density PAIN:  Are you having pain? Yes: NPRS scale: 2 sitting, up stairs 5/10 Pain location: Lt leg Pain description: sharp, achey Aggravating factors: sitting, going up stairs Relieving factors: standing  PRECAUTIONS: None  WEIGHT BEARING RESTRICTIONS: No  FALLS:  Has patient fallen in last 6 months? No   OCCUPATION: psychologist  PLOF: Independent  PATIENT GOALS: decrease pain   OBJECTIVE:   DIAGNOSTIC FINDINGS:  MRI pelvis 06/19/22 IMPRESSION: 1. High-grade partial-thickness cartilage loss with areas of full-thickness cartilage loss of the left femoral head and acetabulum with mild subchondral reactive marrow edema in the superior anterior acetabulum consistent with moderate-severe osteoarthritis. 2. Partial-thickness cartilage loss of the right femoral head and acetabulum. 3. Right anterior labral tear. Left anterior labral tear with a 10 mm paralabral cyst. 4. Broad-based disc bulge at L4-5 with a central annular fissure. Mild left and moderate right facet arthropathy at L4-5.  MRI lumbar spine 06/19/22:  T12- L1: Unremarkable.   L1-L2:  Mild disc bulging   L2-L3: Mild disc bulging and facet spurring   L3-L4: Mild disc bulging with posterior annular fissure and shallow protrusion. Patent canal and foramina   L4-L5: Facet spurring and borderline anterolisthesis. Mild disc bulging with shallow central protrusion at an annular fissure. The protrusion is mildly regressed and spinal canal patency is  improved, especially at the subarticular recesses   L5-S1:Moderate facet spurring on the right.  PALPATION: Tightness in left hip flexors, reduced spring in post rotation of Rt innom.   LOWER EXTREMITY ROM:  PROM WFL but painful, empty end feel  LOWER EXTREMITY MMT:  MMT Right eval Left eval  Hip flexion    Hip extension    Hip abduction    Hip adduction    Hip internal rotation    Hip external rotation    Knee flexion    Knee extension    Ankle dorsiflexion    Ankle plantarflexion    Ankle inversion    Ankle eversion     (Blank rows = not tested)  GAIT: 09/21/22: heel whip bil with lateral to medial heel strike    TODAY'S TREATMENT                                                                            Treatment                            09/24/22:  Mechanical traction 17 min 75lb pull 1 min hold, rest at 25 lb 15s; 3 min set up See plan   Treatment                            09/21/22:  Trigger Point Dry Needling, Manual Therapy Treatment:  Initial or subsequent education regarding Trigger Point Dry Needling: Subsequent Did patient give consent to treatment with Trigger Point Dry Needling: Yes TPDN with skilled palpation and monitoring followed by STM to the following muscles: Lt VL, biceps femoris, TFL, piriformis  Gait with heel strike, retro stepping with slow/eccentric heel lower Heel stretch on steps Discussion regarding diagnoses and options for conservative approach via PT   Treatment                            09/16/22:  Trigger Point Dry Needling, Manual Therapy Treatment:  Initial or subsequent education regarding Trigger Point Dry Needling: Subsequent Did patient give consent to treatment with Trigger Point Dry Needling: Yes TPDN with skilled palpation and monitoring followed by STM to the following muscles: Lt VL, piriformis, glut med/min, TFL  Gait- heel strike Stair pattern with glut activation/quad eccentric control Gastroc  stretch     PATIENT EDUCATION:  Education details: Anatomy of condition, POC, HEP, exercise form/rationale Person educated: Patient Education method: Consulting civil engineer, Demonstration, Tactile cues, Verbal cues, and Handouts Education comprehension: verbalized understanding, returned demonstration, verbal cues required, tactile cues required, and needs further education  HOME EXERCISE PROGRAM: [id number] Hip traction by standing on left foot on elevated surface [id number]- lumbar flexion program  ASSESSMENT:  CLINICAL IMPRESSION: Patient is not experiencing carryover from appointment.  In the beginning of today's appointment we reviewed what she is doing on a daily basis as far as stretching and positioning.  She does note that she has had a history of spinal cord/nerve issues that has created distal symptoms but she has just lived with it.  Lateral leg is tight consistent with greater trochanteric bursitis and gluteus medius and minimus tendinopathy but does present complaints consistent with radicular symptoms.  Placed on mechanical lumbar traction today in hook lying and asked that she avoids a 90 degree seated positioning.  This will help [place] to determine the input from the lumbar spine.   OBJECTIVE IMPAIRMENTS: decreased activity tolerance, decreased ROM, increased muscle spasms, improper body mechanics, and pain.   ACTIVITY LIMITATIONS: carrying, lifting, bending, sitting, squatting, sleeping, stairs, and locomotion level  PARTICIPATION LIMITATIONS: meal prep, cleaning, laundry, driving, shopping, community activity, and occupation  PERSONAL FACTORS: 1-2 comorbidities: chronic back pain, known degeneration in hip, low bone density  are also affecting patient's functional outcome.   GOALS: Goals reviewed with patient? Yes  SHORT TERM GOALS: Target date: 08/23/2022  Determine need to change POC following surgical visit Baseline: Goal status: achieved    LONG TERM GOALS: Target date:  POC date  Able to sleep without interruption by hip pain Baseline:  Goal status: INITIAL  2.  Able to sit for at least 20 min without increased hip pain Baseline:  Goal status: INITIAL  3.  Hip abd strength 90% of opp LE Baseline:  Goal status: INITIAL  4.  Able to navigate stairs with minimal pain Baseline:  Goal status: INITIAL   PLAN:  PT FREQUENCY: 1-2x/week  PT DURATION:8 weeks  PLANNED INTERVENTIONS: Therapeutic exercises, Therapeutic activity, Neuromuscular re-education, Balance training, Gait training, Patient/Family education, Self Care, Joint mobilization, Stair training, Aquatic Therapy, Dry Needling, Electrical stimulation, Spinal mobilization, Cryotherapy, Moist heat, Taping, Ionotophoresis '4mg'$ /ml Dexamethasone, Manual therapy, and Re-evaluation  PLAN FOR NEXT SESSION: DN PRN, 1 more dose of ionto, continue flexion program   Sosha Shepherd C. Katheren Jimmerson PT, DPT 09/24/22 8:56 AM

## 2022-09-27 ENCOUNTER — Encounter (HOSPITAL_BASED_OUTPATIENT_CLINIC_OR_DEPARTMENT_OTHER): Payer: Self-pay | Admitting: Physical Therapy

## 2022-09-28 ENCOUNTER — Encounter (HOSPITAL_BASED_OUTPATIENT_CLINIC_OR_DEPARTMENT_OTHER): Payer: Self-pay | Admitting: Physical Therapy

## 2022-09-28 ENCOUNTER — Ambulatory Visit (HOSPITAL_BASED_OUTPATIENT_CLINIC_OR_DEPARTMENT_OTHER): Payer: 59 | Admitting: Physical Therapy

## 2022-09-28 DIAGNOSIS — M7632 Iliotibial band syndrome, left leg: Secondary | ICD-10-CM

## 2022-09-28 DIAGNOSIS — M25552 Pain in left hip: Secondary | ICD-10-CM | POA: Diagnosis not present

## 2022-09-28 DIAGNOSIS — R262 Difficulty in walking, not elsewhere classified: Secondary | ICD-10-CM

## 2022-09-28 NOTE — Therapy (Signed)
OUTPATIENT PHYSICAL THERAPY LOWER EXTREMITY TREATMENT   Patient Name: Tammy [name] MRN: [id number] DOB:11-21-1960, 62 y.o., female Today's Date: 09/28/2022   PT End of Session - 09/28/22 1320     Visit Number 13    Number of Visits 20    Date for PT Re-Evaluation 10/08/22    Authorization Type [place]    Authorization - Number of Visits 28    PT Start Time 1318    PT Stop Time 1358    PT Time Calculation (min) 40 min    Activity Tolerance Patient tolerated treatment well    Behavior During Therapy [facility] for tasks assessed/performed                 Past Medical History:  Diagnosis Date   Arthritis    Chronic fatigue syndrome    Diverticulosis    External hemorrhoids    Hypertension    Internal hemorrhoids    Neuropathy    Spinal stenosis    Past Surgical History:  Procedure Laterality Date   CARDIAC CATHETERIZATION  09/18/2012   LEFT HEART CATHETERIZATION WITH CORONARY ANGIOGRAM N/A 09/18/2012   Procedure: LEFT HEART CATHETERIZATION WITH CORONARY ANGIOGRAM;  Surgeon: Sherren Mocha, MD;  Location: [facility] CATH LAB;  Service: Cardiovascular;  Laterality: N/A;   TONSILLECTOMY  1965   Patient Active Problem List   Diagnosis Date Noted   Arthritis of left hip 06/25/2022   Hip flexor tendon tightness, left 04/02/2022   Greater trochanteric bursitis of left hip 01/11/2022   Anxiety 07/24/2020   Nonallopathic lesion of cervical region 04/03/2020   Nonallopathic lesion of thoracic region 04/03/2020   Nonallopathic lesion of lumbar region 04/03/2020   Cervical stenosis of spine 03/31/2020   Attention deficit disorder (ADD) in adult 03/31/2020   Low bone density 03/17/2020   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[place]) 02/15/2020   GERD (gastroesophageal reflux disease) 02/15/2020   Allergic rhinitis 03/09/2016   Essential hypertension 02/18/2014   Chronic pain syndrome 02/22/2013   Chronic insomnia 02/22/2013   Paresthesias 10/25/2012   Hyperlipidemia  09/18/2012     REFERRING PROVIDER: [name], DO  REFERRING DIAG: M70.62 (ICD-10-CM) - Greater trochanteric bursitis of left hip  THERAPY DIAG:  It band syndrome, left  Pain in left hip  Difficulty in walking, not elsewhere classified  Rationale for Evaluation and Treatment Rehabilitation  ONSET DATE: subacute on chronic  SUBJECTIVE:   SUBJECTIVE STATEMENT: I feel like the traction was very helpful and standing that day. It is bad today. It seems like it gets worse after sitting for a long period.   PERTINENT HISTORY: LBP, low bone density PAIN:  Are you having pain? Yes: NPRS scale: 2 sitting, up stairs 5/10 Pain location: Lt leg Pain description: sharp, achey Aggravating factors: sitting, going up stairs Relieving factors: standing  PRECAUTIONS: None  WEIGHT BEARING RESTRICTIONS: No  FALLS:  Has patient fallen in last 6 months? No   OCCUPATION: psychologist  PLOF: Independent  PATIENT GOALS: decrease pain   OBJECTIVE:   DIAGNOSTIC FINDINGS:  MRI pelvis 06/19/22 IMPRESSION: 1. High-grade partial-thickness cartilage loss with areas of full-thickness cartilage loss of the left femoral head and acetabulum with mild subchondral reactive marrow edema in the superior anterior acetabulum consistent with moderate-severe osteoarthritis. 2. Partial-thickness cartilage loss of the right femoral head and acetabulum. 3. Right anterior labral tear. Left anterior labral tear with a 10 mm paralabral cyst. 4. Broad-based disc bulge at L4-5 with a central annular fissure. Mild left  and moderate right facet arthropathy at L4-5.  MRI lumbar spine 06/19/22:  T12- L1: Unremarkable.   L1-L2: Mild disc bulging   L2-L3: Mild disc bulging and facet spurring   L3-L4: Mild disc bulging with posterior annular fissure and shallow protrusion. Patent canal and foramina   L4-L5: Facet spurring and borderline anterolisthesis. Mild disc bulging with shallow central  protrusion at an annular fissure. The protrusion is mildly regressed and spinal canal patency is improved, especially at the subarticular recesses   L5-S1:Moderate facet spurring on the right.  PALPATION: Tightness in left hip flexors, reduced spring in post rotation of Rt innom.   LOWER EXTREMITY ROM:  PROM WFL but painful, empty end feel  LOWER EXTREMITY MMT:  MMT Right eval Left eval  Hip flexion    Hip extension    Hip abduction    Hip adduction    Hip internal rotation    Hip external rotation    Knee flexion    Knee extension    Ankle dorsiflexion    Ankle plantarflexion    Ankle inversion    Ankle eversion     (Blank rows = not tested)  GAIT: 09/21/22: heel whip bil with lateral to medial heel strike    TODAY'S TREATMENT                                                                            Treatment                            09/28/22:  Mechanical traction 17 min 85lb pull 1 min hold, rest at 25 lb 15s; 3 min set up [place] ab set with ball squeeze following traction- discussed keeping neutral spine in hooklying or quadruped or standing as much as possible through the day Ionto 1cc dex 6 hr wear    Treatment                            09/24/22:  Mechanical traction 17 min 75lb pull 1 min hold, rest at 25 lb 15s; 3 min set up See plan   Treatment                            09/21/22:  Trigger Point Dry Needling, Manual Therapy Treatment:  Initial or subsequent education regarding Trigger Point Dry Needling: Subsequent Did patient give consent to treatment with Trigger Point Dry Needling: Yes TPDN with skilled palpation and monitoring followed by STM to the following muscles: Lt VL, biceps femoris, TFL, piriformis  Gait with heel strike, retro stepping with slow/eccentric heel lower Heel stretch on steps Discussion regarding diagnoses and options for conservative approach via PT   Treatment                            09/16/22:  Trigger Point Dry  Needling, Manual Therapy Treatment:  Initial or subsequent education regarding Trigger Point Dry Needling: Subsequent Did patient give consent to treatment with Trigger Point Dry Needling: Yes TPDN with skilled palpation and monitoring followed by  STM to the following muscles: Lt VL, piriformis, glut med/min, TFL  Gait- heel strike Stair pattern with glut activation/quad eccentric control Gastroc stretch     PATIENT EDUCATION:  Education details: Anatomy of condition, POC, HEP, exercise form/rationale Person educated: Patient Education method: Explanation, Demonstration, Tactile cues, Verbal cues, and Handouts Education comprehension: verbalized understanding, returned demonstration, verbal cues required, tactile cues required, and needs further education  HOME EXERCISE PROGRAM: [id number] Hip traction by standing on left foot on elevated surface [id number]- lumbar flexion program  ASSESSMENT:  CLINICAL IMPRESSION: Able to reduce concordant symptoms with traction today which continues to indicate contribution from lumbar spine. Placed last dose of ionto today to Lt hip gr troch bursa. Will turn focus to traction and daily posture corrections and lead into stabilization for symptoms management. Still has difficulty with hip flexion to climb stairs creating concordant pain.    OBJECTIVE IMPAIRMENTS: decreased activity tolerance, decreased ROM, increased muscle spasms, improper body mechanics, and pain.   ACTIVITY LIMITATIONS: carrying, lifting, bending, sitting, squatting, sleeping, stairs, and locomotion level  PARTICIPATION LIMITATIONS: meal prep, cleaning, laundry, driving, shopping, community activity, and occupation  PERSONAL FACTORS: 1-2 comorbidities: chronic back pain, known degeneration in hip, low bone density  are also affecting patient's functional outcome.   GOALS: Goals reviewed with patient? Yes  SHORT TERM GOALS: Target date: 08/23/2022  Determine need to change POC  following surgical visit Baseline: Goal status: achieved    LONG TERM GOALS: Target date: POC date  Able to sleep without interruption by hip pain Baseline:  Goal status: INITIAL  2.  Able to sit for at least 20 min without increased hip pain Baseline:  Goal status: INITIAL  3.  Hip abd strength 90% of opp LE Baseline:  Goal status: INITIAL  4.  Able to navigate stairs with minimal pain Baseline:  Goal status: INITIAL   PLAN:  PT FREQUENCY: 1-2x/week  PT DURATION:8 weeks  PLANNED INTERVENTIONS: Therapeutic exercises, Therapeutic activity, Neuromuscular re-education, Balance training, Gait training, Patient/Family education, Self Care, Joint mobilization, Stair training, Aquatic Therapy, Dry Needling, Electrical stimulation, Spinal mobilization, Cryotherapy, Moist heat, Taping, Ionotophoresis '4mg'$ /ml Dexamethasone, Manual therapy, and Re-evaluation  PLAN FOR NEXT SESSION: outcome of increased traction pull? Consider DN to lumbar spine. Progress core stabilization.    Kimberlin Scheel C. Wadsworth Skolnick PT, DPT 09/28/22 2:08 PM

## 2022-10-01 ENCOUNTER — Other Ambulatory Visit (HOSPITAL_COMMUNITY): Payer: Self-pay

## 2022-10-01 ENCOUNTER — Ambulatory Visit (HOSPITAL_BASED_OUTPATIENT_CLINIC_OR_DEPARTMENT_OTHER): Payer: 59 | Admitting: Physical Therapy

## 2022-10-01 ENCOUNTER — Encounter (HOSPITAL_BASED_OUTPATIENT_CLINIC_OR_DEPARTMENT_OTHER): Payer: Self-pay | Admitting: Physical Therapy

## 2022-10-01 DIAGNOSIS — M25552 Pain in left hip: Secondary | ICD-10-CM

## 2022-10-01 DIAGNOSIS — R262 Difficulty in walking, not elsewhere classified: Secondary | ICD-10-CM | POA: Diagnosis not present

## 2022-10-01 DIAGNOSIS — M7632 Iliotibial band syndrome, left leg: Secondary | ICD-10-CM

## 2022-10-01 NOTE — Therapy (Signed)
OUTPATIENT PHYSICAL THERAPY LOWER EXTREMITY TREATMENT   Patient Name: Tammy [name] MRN: [id number] DOB:May 20, 1961, 61 y.o., female Today's Date: 10/01/2022   PT End of Session - 10/01/22 1307     Visit Number 14    Number of Visits 20    Date for PT Re-Evaluation 10/08/22    Authorization Type     Authorization - Number of Visits 28    PT Start Time 0800    PT Stop Time 5449    PT Time Calculation (min) 55 min    Activity Tolerance Patient tolerated treatment well    Behavior During Therapy [facility] for tasks assessed/performed                 Past Medical History:  Diagnosis Date   Arthritis    Chronic fatigue syndrome    Diverticulosis    External hemorrhoids    Hypertension    Internal hemorrhoids    Neuropathy    Spinal stenosis    Past Surgical History:  Procedure Laterality Date   CARDIAC CATHETERIZATION  09/18/2012   LEFT HEART CATHETERIZATION WITH CORONARY ANGIOGRAM N/A 09/18/2012   Procedure: LEFT HEART CATHETERIZATION WITH CORONARY ANGIOGRAM;  Surgeon: Sherren Mocha, MD;  Location: [facility] CATH LAB;  Service: Cardiovascular;  Laterality: N/A;   TONSILLECTOMY  1965   Patient Active Problem List   Diagnosis Date Noted   Arthritis of left hip 06/25/2022   Hip flexor tendon tightness, left 04/02/2022   Greater trochanteric bursitis of left hip 01/11/2022   Anxiety 07/24/2020   Nonallopathic lesion of cervical region 04/03/2020   Nonallopathic lesion of thoracic region 04/03/2020   Nonallopathic lesion of lumbar region 04/03/2020   Cervical stenosis of spine 03/31/2020   Attention deficit disorder (ADD) in adult 03/31/2020   Low bone density 03/17/2020   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[place]) 02/15/2020   GERD (gastroesophageal reflux disease) 02/15/2020   Allergic rhinitis 03/09/2016   Essential hypertension 02/18/2014   Chronic pain syndrome 02/22/2013   Chronic insomnia 02/22/2013   Paresthesias 10/25/2012   Hyperlipidemia  09/18/2012     REFERRING PROVIDER: [name], DO  REFERRING DIAG: M70.62 (ICD-10-CM) - Greater trochanteric bursitis of left hip  THERAPY DIAG:  It band syndrome, left  Pain in left hip  Difficulty in walking, not elsewhere classified  Rationale for Evaluation and Treatment Rehabilitation  ONSET DATE: subacute on chronic  SUBJECTIVE:   SUBJECTIVE STATEMENT: It has been really bad and my leg is really hurting.  I am getting very frustrated.  PERTINENT HISTORY: LBP, low bone density PAIN:  Are you having pain? Yes: NPRS scale: 2 sitting, up stairs 5/10 Pain location: Lt leg Pain description: sharp, achey Aggravating factors: sitting, going up stairs Relieving factors: standing  PRECAUTIONS: None  WEIGHT BEARING RESTRICTIONS: No  FALLS:  Has patient fallen in last 6 months? No   OCCUPATION: psychologist  PLOF: Independent  PATIENT GOALS: decrease pain   OBJECTIVE:   DIAGNOSTIC FINDINGS:  MRI pelvis 06/19/22 IMPRESSION: 1. High-grade partial-thickness cartilage loss with areas of full-thickness cartilage loss of the left femoral head and acetabulum with mild subchondral reactive marrow edema in the superior anterior acetabulum consistent with moderate-severe osteoarthritis. 2. Partial-thickness cartilage loss of the right femoral head and acetabulum. 3. Right anterior labral tear. Left anterior labral tear with a 10 mm paralabral cyst. 4. Broad-based disc bulge at L4-5 with a central annular fissure. Mild left and moderate right facet arthropathy at L4-5.  MRI lumbar spine 06/19/22:  T12- L1: Unremarkable.   L1-L2: Mild disc bulging   L2-L3: Mild disc bulging and facet spurring   L3-L4: Mild disc bulging with posterior annular fissure and shallow protrusion. Patent canal and foramina   L4-L5: Facet spurring and borderline anterolisthesis. Mild disc bulging with shallow central protrusion at an annular fissure. The protrusion is mildly  regressed and spinal canal patency is improved, especially at the subarticular recesses   L5-S1:Moderate facet spurring on the right.  PALPATION: Tightness in left hip flexors, reduced spring in post rotation of Rt innom.   LOWER EXTREMITY ROM:  PROM WFL but painful, empty end feel  LOWER EXTREMITY MMT:  MMT Right eval Left eval  Hip flexion    Hip extension    Hip abduction    Hip adduction    Hip internal rotation    Hip external rotation    Knee flexion    Knee extension    Ankle dorsiflexion    Ankle plantarflexion    Ankle inversion    Ankle eversion     (Blank rows = not tested)  GAIT: 09/21/22: heel whip bil with lateral to medial heel strike    TODAY'S TREATMENT                                                                            Treatment                            10/01/22:  Trigger Point Dry Needling, Manual Therapy Treatment:  Initial or subsequent education regarding Trigger Point Dry Needling: Subsequent Did patient give consent to treatment with Trigger Point Dry Needling: Yes TPDN with skilled palpation and monitoring followed by STM to the following muscles: L3-5 paraspinals on the Right, Lt superior fibers of glut max/med/min  Rt to Lt rotation mobs of spinous process of L4 & L5 K[place] X over L3-5 Discussion regarding imaging, symptoms, anatomy and contributors   Treatment                            09/28/22:  Mechanical traction 17 min 85lb pull 1 min hold, rest at 25 lb 15s; 3 min set up [place] ab set with ball squeeze following traction- discussed keeping neutral spine in hooklying or quadruped or standing as much as possible through the day Ionto 1cc dex 6 hr wear    Treatment                            09/24/22:  Mechanical traction 17 min 75lb pull 1 min hold, rest at 25 lb 15s; 3 min set up See plan   Treatment                            09/21/22:  Trigger Point Dry Needling, Manual Therapy Treatment:  Initial or subsequent  education regarding Trigger Point Dry Needling: Subsequent Did patient give consent to treatment with Trigger Point Dry Needling: Yes TPDN with skilled palpation and monitoring followed by STM to the following muscles: Lt VL, biceps femoris,  TFL, piriformis  Gait with heel strike, retro stepping with slow/eccentric heel lower Heel stretch on steps Discussion regarding diagnoses and options for conservative approach via PT   Treatment                            09/16/22:  Trigger Point Dry Needling, Manual Therapy Treatment:  Initial or subsequent education regarding Trigger Point Dry Needling: Subsequent Did patient give consent to treatment with Trigger Point Dry Needling: Yes TPDN with skilled palpation and monitoring followed by STM to the following muscles: Lt VL, piriformis, glut med/min, TFL  Gait- heel strike Stair pattern with glut activation/quad eccentric control Gastroc stretch     PATIENT EDUCATION:  Education details: Anatomy of condition, POC, HEP, exercise form/rationale Person educated: Patient Education method: Explanation, Demonstration, Tactile cues, Verbal cues, and Handouts Education comprehension: verbalized understanding, returned demonstration, verbal cues required, tactile cues required, and needs further education  HOME EXERCISE PROGRAM: [id number] Hip traction by standing on left foot on elevated surface [id number]- lumbar flexion program  ASSESSMENT:  CLINICAL IMPRESSION: Patient is, understandably, very frustrated with her lack of progress in pain levels as it continues to affect her functional daily activities.  Signs and symptoms along the left lower extremity are consistent with L5 distribution as well as pain from hip abductor group and greater trochanteric bursa.  Chose to utilize dry needling to lumbar spine and forego traction as this was not helpful last time and she experienced an increase in pain.  Asked her to obtain a quadruped position as  frequently as possible throughout the day in order to improve abdominal wall activation and support to lumbar spine following dry needling and encouraged her to utilize electrical stimulation on and off throughout her day.  We did discuss scheduling injection under imaging as the referral is currently in a work queue for [employer].  She agreed to schedule and we will move forward based on symptoms.  She does note that she has some cervical spine issues and did notice an increase in overall electrical sensations but at this time I do not see any reason to be concerned about neural compression at the cervical spine but will continue to monitor.   OBJECTIVE IMPAIRMENTS: decreased activity tolerance, decreased ROM, increased muscle spasms, improper body mechanics, and pain.   ACTIVITY LIMITATIONS: carrying, lifting, bending, sitting, squatting, sleeping, stairs, and locomotion level  PARTICIPATION LIMITATIONS: meal prep, cleaning, laundry, driving, shopping, community activity, and occupation  PERSONAL FACTORS: 1-2 comorbidities: chronic back pain, known degeneration in hip, low bone density  are also affecting patient's functional outcome.   GOALS: Goals reviewed with patient? Yes  SHORT TERM GOALS: Target date: 08/23/2022  Determine need to change POC following surgical visit Baseline: Goal status: achieved    LONG TERM GOALS: Target date: POC date  Able to sleep without interruption by hip pain Baseline:  Goal status: INITIAL  2.  Able to sit for at least 20 min without increased hip pain Baseline:  Goal status: INITIAL  3.  Hip abd strength 90% of opp LE Baseline:  Goal status: INITIAL  4.  Able to navigate stairs with minimal pain Baseline:  Goal status: INITIAL   PLAN:  PT FREQUENCY: 1-2x/week  PT DURATION:8 weeks  PLANNED INTERVENTIONS: Therapeutic exercises, Therapeutic activity, Neuromuscular re-education, Balance training, Gait training, Patient/Family  education, Self Care, Joint mobilization, Stair training, Aquatic Therapy, Dry Needling, Electrical stimulation, Spinal mobilization, Cryotherapy, Moist heat, Taping, Ionotophoresis  $'4mg'f$ /ml Dexamethasone, Manual therapy, and Re-evaluation  PLAN FOR NEXT SESSION: outcome of increased traction pull? Consider DN to lumbar spine. Progress core stabilization.    Thania Woodlief C. Lakshmi Sundeen PT, DPT 10/01/22 1:08 PM

## 2022-10-05 ENCOUNTER — Ambulatory Visit (HOSPITAL_BASED_OUTPATIENT_CLINIC_OR_DEPARTMENT_OTHER): Payer: 59 | Admitting: Physical Therapy

## 2022-10-05 ENCOUNTER — Encounter (HOSPITAL_BASED_OUTPATIENT_CLINIC_OR_DEPARTMENT_OTHER): Payer: Self-pay | Admitting: Physical Therapy

## 2022-10-05 DIAGNOSIS — M7632 Iliotibial band syndrome, left leg: Secondary | ICD-10-CM | POA: Diagnosis not present

## 2022-10-05 DIAGNOSIS — R262 Difficulty in walking, not elsewhere classified: Secondary | ICD-10-CM

## 2022-10-05 DIAGNOSIS — M25552 Pain in left hip: Secondary | ICD-10-CM

## 2022-10-05 NOTE — Therapy (Addendum)
OUTPATIENT PHYSICAL THERAPY LOWER EXTREMITY TREATMENT   Patient Name: Tammy [name] MRN: [id number] DOB:[birth year], 62 y.o., female Today's Date: 10/05/2022   PT End of Session - 10/05/22 1056     Visit Number 15    Number of Visits 20    Date for PT Re-Evaluation 10/08/22    Authorization Type     Authorization - Number of Visits 25    PT Start Time 0759    PT Stop Time 0835    PT Time Calculation (min) 36 min    Activity Tolerance Patient tolerated treatment well    Behavior During Therapy [facility] for tasks assessed/performed                 Past Medical History:  Diagnosis Date   Arthritis    Chronic fatigue syndrome    Diverticulosis    External hemorrhoids    Hypertension    Internal hemorrhoids    Neuropathy    Spinal stenosis    Past Surgical History:  Procedure Laterality Date   CARDIAC CATHETERIZATION  09/18/2012   LEFT HEART CATHETERIZATION WITH CORONARY ANGIOGRAM N/A 09/18/2012   Procedure: LEFT HEART CATHETERIZATION WITH CORONARY ANGIOGRAM;  Surgeon: Sherren Mocha, MD;  Location: [facility] CATH LAB;  Service: Cardiovascular;  Laterality: N/A;   TONSILLECTOMY  1965   Patient Active Problem List   Diagnosis Date Noted   Arthritis of left hip 06/25/2022   Hip flexor tendon tightness, left 04/02/2022   Greater trochanteric bursitis of left hip 01/11/2022   Anxiety 07/24/2020   Nonallopathic lesion of cervical region 04/03/2020   Nonallopathic lesion of thoracic region 04/03/2020   Nonallopathic lesion of lumbar region 04/03/2020   Cervical stenosis of spine 03/31/2020   Attention deficit disorder (ADD) in adult 03/31/2020   Low bone density 03/17/2020   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[place]) 02/15/2020   GERD (gastroesophageal reflux disease) 02/15/2020   Allergic rhinitis 03/09/2016   Essential hypertension 02/18/2014   Chronic pain syndrome 02/22/2013   Chronic insomnia 02/22/2013   Paresthesias 10/25/2012   Hyperlipidemia  09/18/2012     REFERRING PROVIDER: [name], DO  REFERRING DIAG: M70.62 (ICD-10-CM) - Greater trochanteric bursitis of left hip  THERAPY DIAG:  It band syndrome, left  Pain in left hip  Difficulty in walking, not elsewhere classified  Rationale for Evaluation and Treatment Rehabilitation  ONSET DATE: subacute on chronic  SUBJECTIVE:   SUBJECTIVE STATEMENT: I was doing okay but then [place] and started having severe pain.  I moved the couch at home to be able to lay flat when I am watching TV at nighttime.  PERTINENT HISTORY: LBP, low bone density PAIN:  Are you having pain? Yes: NPRS scale: 2 sitting, up stairs 5/10 Pain location: Lt leg Pain description: sharp, achey Aggravating factors: sitting, going up stairs Relieving factors: standing  PRECAUTIONS: None  WEIGHT BEARING RESTRICTIONS: No  FALLS:  Has patient fallen in last 6 months? No   OCCUPATION: psychologist  PLOF: Independent  PATIENT GOALS: decrease pain   OBJECTIVE:   DIAGNOSTIC FINDINGS:  MRI pelvis 06/19/22 IMPRESSION: 1. High-grade partial-thickness cartilage loss with areas of full-thickness cartilage loss of the left femoral head and acetabulum with mild subchondral reactive marrow edema in the superior anterior acetabulum consistent with moderate-severe osteoarthritis. 2. Partial-thickness cartilage loss of the right femoral head and acetabulum. 3. Right anterior labral tear. Left anterior labral tear with a 10  mm paralabral cyst. 4. Broad-based disc bulge at L4-5 with a central annular fissure. Mild left and moderate right facet arthropathy at L4-5.  MRI lumbar spine 06/19/22:  T12- L1: Unremarkable.   L1-L2: Mild disc bulging   L2-L3: Mild disc bulging and facet spurring   L3-L4: Mild disc bulging with posterior annular fissure and shallow protrusion. Patent canal and foramina   L4-L5: Facet spurring and  borderline anterolisthesis. Mild disc bulging with shallow central protrusion at an annular fissure. The protrusion is mildly regressed and spinal canal patency is improved, especially at the subarticular recesses   L5-S1:Moderate facet spurring on the right.  PALPATION: Tightness in left hip flexors, reduced spring in post rotation of Rt innom.   LOWER EXTREMITY ROM:  PROM WFL but painful, empty end feel  LOWER EXTREMITY MMT:  MMT Right eval Left eval   Hip flexion     Hip extension     Hip abduction     Hip adduction     Hip internal rotation     Hip external rotation     Knee flexion     Knee extension     Ankle dorsiflexion     Ankle plantarflexion     Ankle inversion     Ankle eversion      (Blank rows = not tested)  GAIT: 09/21/22: heel whip bil with lateral to medial heel strike    TODAY'S TREATMENT                                                                            Treatment                            10/05/22:  Trigger Point Dry Needling, Manual Therapy Treatment:  Initial or subsequent education regarding Trigger Point Dry Needling: Subsequent Did patient give consent to treatment with Trigger Point Dry Needling: Yes TPDN with skilled palpation and monitoring followed by STM to the following muscles: L4 paraspinals on the Right, left TFL in right side-lying over pillow Prone mobilization of L4 to encourage a right to left rotation of the spinous process Placed tape L4 vertebrae  Lateral distraction of the left femoral acetabular joint in hook lying at various angles of external rotation.  Kept this a grade 3 to avoid irritation Continued discussion on symptom awareness through the day.  Encouraged her to look at a picture of dermatome patterns versus trigger point referral patterns when she is feeling pain Standing glut activation for upright posture   Treatment                            10/01/22:  Trigger Point Dry Needling, Manual Therapy  Treatment:  Initial or subsequent education regarding Trigger Point Dry Needling: Subsequent Did patient give consent to treatment with Trigger Point Dry Needling: Yes TPDN with skilled palpation and monitoring followed by STM to the following muscles: L3-5 paraspinals on the Right, Lt superior fibers of glut max/med/min  Rt to Lt rotation mobs of spinous process of L4 & L5 Ktape X over L3-5 Discussion regarding imaging, symptoms, anatomy and contributors  Treatment                            09/28/22:  Mechanical traction 17 min 85lb pull 1 min hold, rest at 25 lb 15s; 3 min set up [place] ab set with ball squeeze following traction- discussed keeping neutral spine in hooklying or quadruped or standing as much as possible through the day Ionto 1cc dex 6 hr wear    Treatment                            09/24/22:  Mechanical traction 17 min 75lb pull 1 min hold, rest at 25 lb 15s; 3 min set up See plan      PATIENT EDUCATION:  Education details: Anatomy of condition, POC, HEP, exercise form/rationale Person educated: Patient Education method: Explanation, Demonstration, Tactile cues, Verbal cues, and Handouts Education comprehension: verbalized understanding, returned demonstration, verbal cues required, tactile cues required, and needs further education  HOME EXERCISE PROGRAM: [id number] Hip traction by standing on left foot on elevated surface [id number]- lumbar flexion program  ASSESSMENT:  CLINICAL IMPRESSION: Able to reduce concordant pain with treatment again today.  Will continue to work to decide whether primary issue is lumbar spine versus hip as both continue to present symptoms.  OBJECTIVE IMPAIRMENTS: decreased activity tolerance, decreased ROM, increased muscle spasms, improper body mechanics, and pain.   ACTIVITY LIMITATIONS: carrying, lifting, bending, sitting, squatting, sleeping, stairs, and locomotion level  PARTICIPATION LIMITATIONS: meal prep, cleaning,  laundry, driving, shopping, community activity, and occupation  PERSONAL FACTORS: 1-2 comorbidities: chronic back pain, known degeneration in hip, low bone density  are also affecting patient's functional outcome.   GOALS: Goals reviewed with patient? Yes  SHORT TERM GOALS: Target date: 08/23/2022  Determine need to change POC following surgical visit Baseline: Goal status: achieved    LONG TERM GOALS: Target date: POC date  Able to sleep without interruption by hip pain Baseline:  Goal status: INITIAL  2.  Able to sit for at least 20 min without increased hip pain Baseline:  Goal status: INITIAL  3.  Hip abd strength 90% of opp LE Baseline:  Goal status: INITIAL  4.  Able to navigate stairs with minimal pain Baseline:  Goal status: INITIAL   PLAN:  PT FREQUENCY: 1-2x/week  PT DURATION:8 weeks  PLANNED INTERVENTIONS: Therapeutic exercises, Therapeutic activity, Neuromuscular re-education, Balance training, Gait training, Patient/Family education, Self Care, Joint mobilization, Stair training, Aquatic Therapy, Dry Needling, Electrical stimulation, Spinal mobilization, Cryotherapy, Moist heat, Taping, Ionotophoresis '4mg'$ /ml Dexamethasone, Manual therapy, and Re-evaluation  PLAN FOR NEXT SESSION: outcome of increased traction pull? Consider DN to lumbar spine. Progress core stabilization.    Malikah Lakey C. Navada Osterhout PT, DPT 10/05/22 11:03 AM

## 2022-10-06 ENCOUNTER — Other Ambulatory Visit (HOSPITAL_COMMUNITY): Payer: Self-pay

## 2022-10-06 DIAGNOSIS — E663 Overweight: Secondary | ICD-10-CM | POA: Diagnosis not present

## 2022-10-06 DIAGNOSIS — Z8639 Personal history of other endocrine, nutritional and metabolic disease: Secondary | ICD-10-CM | POA: Diagnosis not present

## 2022-10-06 DIAGNOSIS — Z6826 Body mass index (BMI) 26.0-26.9, adult: Secondary | ICD-10-CM | POA: Diagnosis not present

## 2022-10-06 DIAGNOSIS — F5081 Binge eating disorder: Secondary | ICD-10-CM | POA: Diagnosis not present

## 2022-10-06 DIAGNOSIS — M5432 Sciatica, left side: Secondary | ICD-10-CM | POA: Diagnosis not present

## 2022-10-06 MED ORDER — ZEPBOUND 5 MG/0.5ML ~~LOC~~ SOAJ
5.0000 mg | SUBCUTANEOUS | 0 refills | Status: DC
Start: 2022-10-06 — End: 2023-02-22
  Filled 2022-10-06 – 2022-10-15 (×2): qty 2, 28d supply, fill #0

## 2022-10-07 ENCOUNTER — Ambulatory Visit (HOSPITAL_BASED_OUTPATIENT_CLINIC_OR_DEPARTMENT_OTHER): Payer: Self-pay | Admitting: Orthopaedic Surgery

## 2022-10-07 ENCOUNTER — Ambulatory Visit (INDEPENDENT_AMBULATORY_CARE_PROVIDER_SITE_OTHER): Payer: 59 | Admitting: Orthopaedic Surgery

## 2022-10-07 ENCOUNTER — Encounter: Payer: Self-pay | Admitting: Orthopaedic Surgery

## 2022-10-07 DIAGNOSIS — M25552 Pain in left hip: Secondary | ICD-10-CM

## 2022-10-07 DIAGNOSIS — M7062 Trochanteric bursitis, left hip: Secondary | ICD-10-CM

## 2022-10-07 DIAGNOSIS — M7632 Iliotibial band syndrome, left leg: Secondary | ICD-10-CM

## 2022-10-07 NOTE — Progress Notes (Signed)
The patient is well-known to me.  She is a patient of mine and Dr. Hulan Saas who we are trying to figure out what the best options for her are as it relates to left lateral hip pain and a slight radicular component of her pain.  She does have MRI of her left hip showing some edema in the femoral head and some areas of significant cartilage loss but she has never had groin pain.  She did let me know today that she is being set up for an epidural steroid injection in early February that Dr. Tamala Julian felt was reasonable as do I in terms of helping with her diagnosis and treatment.  On exam today she still has fluid and full range of motion of her left hip with pain only laterally.  She did see my partner over at [place] who did provide injection of the gluteus medius and minimus tendon under ultrasound and she got no relief from that injection at all.  I still feel this is potentially more of a hip issue but I agree that it is worth trying a left-sided hip dural steroid injection.  Also feel comfortable with Dr. Tamala Julian considering an ultrasound-guided intra-articular left hip injection at some point.  We had a long and thorough discussion about the different options.  I do like to see her back in 4 weeks because by then she will have already had the intervention in her lumbar spine.

## 2022-10-08 ENCOUNTER — Ambulatory Visit (HOSPITAL_BASED_OUTPATIENT_CLINIC_OR_DEPARTMENT_OTHER): Payer: 59 | Admitting: Physical Therapy

## 2022-10-08 ENCOUNTER — Other Ambulatory Visit (HOSPITAL_COMMUNITY): Payer: Self-pay

## 2022-10-08 ENCOUNTER — Encounter (HOSPITAL_BASED_OUTPATIENT_CLINIC_OR_DEPARTMENT_OTHER): Payer: Self-pay | Admitting: Physical Therapy

## 2022-10-08 DIAGNOSIS — M7632 Iliotibial band syndrome, left leg: Secondary | ICD-10-CM | POA: Diagnosis not present

## 2022-10-08 DIAGNOSIS — R262 Difficulty in walking, not elsewhere classified: Secondary | ICD-10-CM

## 2022-10-08 DIAGNOSIS — M25552 Pain in left hip: Secondary | ICD-10-CM

## 2022-10-08 MED ORDER — LISDEXAMFETAMINE DIMESYLATE 20 MG PO CAPS
20.0000 mg | ORAL_CAPSULE | Freq: Every morning | ORAL | 0 refills | Status: DC
  Filled 2022-10-29: qty 30, 30d supply, fill #0

## 2022-10-08 NOTE — Therapy (Signed)
OUTPATIENT PHYSICAL THERAPY LOWER EXTREMITY TREATMENT   Patient Name: Tammy [name] MRN: [id number] DOB:1961-04-22, 62 y.o., female Today's Date: 10/08/2022   PT End of Session - 10/08/22 0811     Visit Number 15    Number of Visits 31    Date for PT Re-Evaluation 12/03/22    Authorization Type [place]    Authorization - Number of Visits 25    PT Start Time 0804    PT Stop Time 0845    PT Time Calculation (min) 41 min    Activity Tolerance Patient tolerated treatment well    Behavior During Therapy [facility] for tasks assessed/performed                 Past Medical History:  Diagnosis Date   Arthritis    Chronic fatigue syndrome    Diverticulosis    External hemorrhoids    Hypertension    Internal hemorrhoids    Neuropathy    Spinal stenosis    Past Surgical History:  Procedure Laterality Date   CARDIAC CATHETERIZATION  09/18/2012   LEFT HEART CATHETERIZATION WITH CORONARY ANGIOGRAM N/A 09/18/2012   Procedure: LEFT HEART CATHETERIZATION WITH CORONARY ANGIOGRAM;  Surgeon: Sherren Mocha, MD;  Location: [facility] CATH LAB;  Service: Cardiovascular;  Laterality: N/A;   TONSILLECTOMY  1965   Patient Active Problem List   Diagnosis Date Noted   Arthritis of left hip 06/25/2022   Hip flexor tendon tightness, left 04/02/2022   Greater trochanteric bursitis of left hip 01/11/2022   Anxiety 07/24/2020   Nonallopathic lesion of cervical region 04/03/2020   Nonallopathic lesion of thoracic region 04/03/2020   Nonallopathic lesion of lumbar region 04/03/2020   Cervical stenosis of spine 03/31/2020   Attention deficit disorder (ADD) in adult 03/31/2020   Low bone density 03/17/2020   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[place]) 02/15/2020   GERD (gastroesophageal reflux disease) 02/15/2020   Allergic rhinitis 03/09/2016   Essential hypertension 02/18/2014   Chronic pain syndrome 02/22/2013   Chronic insomnia 02/22/2013   Paresthesias 10/25/2012   Hyperlipidemia  09/18/2012     REFERRING PROVIDER: [name], DO  REFERRING DIAG: M70.62 (ICD-10-CM) - Greater trochanteric bursitis of left hip  THERAPY DIAG:  It band syndrome, left  Pain in left hip  Difficulty in walking, not elsewhere classified  Rationale for Evaluation and Treatment Rehabilitation  ONSET DATE: subacute on chronic  SUBJECTIVE:   SUBJECTIVE STATEMENT: I was doing okay but then I [place] and started having severe pain.  I moved the couch at home to be able to lay flat when I am watching TV at nighttime.  PERTINENT HISTORY: LBP, low bone density PAIN:  Are you having pain? Yes: NPRS scale: 2 sitting, up stairs 5/10 Pain location: Lt leg Pain description: sharp, achey Aggravating factors: sitting, going up stairs Relieving factors: standing  PRECAUTIONS: None  WEIGHT BEARING RESTRICTIONS: No  FALLS:  Has patient fallen in last 6 months? No   OCCUPATION: psychologist  PLOF: Independent  PATIENT GOALS: decrease pain   OBJECTIVE:   DIAGNOSTIC FINDINGS:  MRI pelvis 06/19/22 IMPRESSION: 1. High-grade partial-thickness cartilage loss with areas of full-thickness cartilage loss of the left femoral head and acetabulum with mild subchondral reactive marrow edema in the superior anterior acetabulum consistent with moderate-severe osteoarthritis. 2. Partial-thickness cartilage loss of the right femoral head and acetabulum. 3. Right anterior labral tear. Left anterior labral tear with a 10  mm paralabral cyst. 4. Broad-based disc bulge at L4-5 with a central annular fissure. Mild left and moderate right facet arthropathy at L4-5.  MRI lumbar spine 06/19/22:  T12- L1: Unremarkable.   L1-L2: Mild disc bulging   L2-L3: Mild disc bulging and facet spurring   L3-L4: Mild disc bulging with posterior annular fissure and shallow protrusion. Patent canal and foramina   L4-L5: Facet spurring and  borderline anterolisthesis. Mild disc bulging with shallow central protrusion at an annular fissure. The protrusion is mildly regressed and spinal canal patency is improved, especially at the subarticular recesses   L5-S1:Moderate facet spurring on the right.  PALPATION: Tightness in left hip flexors, reduced spring in post rotation of Rt innom.   LOWER EXTREMITY ROM:  10/08/22: Active range of motion into hip flexion limited by pain but is able to demonstrate at least 90 degrees  LOWER EXTREMITY MMT:  10/08/22: Due to pain levels that does not feel appropriate to test strength with hand-held dynamometer.  Is able to demonstrate active range of motion against gravity without difficulty.  Discomfort notable in hip with repetitive lifts against gravity.  Pain limits strength creation to ascend stairs with left lower extremity.  Spends most of her day in standing due to pain in a seated position indicating functional endurance to large posture supporting muscles.  GAIT: 09/21/22: heel whip bil with lateral to medial heel strike    TODAY'S TREATMENT                                                                            Treatment                            10/08/22:  Education regarding diagnostics of lumbar and hip intra-articular injections Gait analysis and slow motion using iPad Kinesiotape to posterior tibialis and plantarflexion peroneals in dorsiflexion and eversion, anterior tibialis and dorsiflexion Segmented first half of the swing through phase of gait with left foot to reduce heel whip Edge of step calf stretch and heel raises with a ball between the ankles to reduce supination of calcaneus   Treatment                            10/05/22:  Trigger Point Dry Needling, Manual Therapy Treatment:  Initial or subsequent education regarding Trigger Point Dry Needling: Subsequent Did patient give consent to treatment with Trigger Point Dry Needling: Yes TPDN with skilled  palpation and monitoring followed by STM to the following muscles: L4 paraspinals on the Right, left TFL in right side-lying over pillow Prone mobilization of L4 to encourage a right to left rotation of the spinous process Placed tape L4 vertebrae  Lateral distraction of the left femoral acetabular joint in hook lying at various angles of external rotation.  Kept this a grade 3 to avoid irritation Continued discussion on symptom awareness through the day.  Encouraged her to look at a picture of dermatome patterns versus trigger point referral patterns when she is feeling pain Standing glut activation for upright posture   Treatment  10/01/22:  Trigger Point Dry Needling, Manual Therapy Treatment:  Initial or subsequent education regarding Trigger Point Dry Needling: Subsequent Did patient give consent to treatment with Trigger Point Dry Needling: Yes TPDN with skilled palpation and monitoring followed by STM to the following muscles: L3-5 paraspinals on the Right, Lt superior fibers of glut max/med/min  Rt to Lt rotation mobs of spinous process of L4 & L5 Ktape X over L3-5 Discussion regarding imaging, symptoms, anatomy and contributors    PATIENT EDUCATION:  Education details: Anatomy of condition, POC, HEP, exercise form/rationale Person educated: Patient Education method: Consulting civil engineer, Demonstration, Tactile cues, Verbal cues, and Handouts Education comprehension: verbalized understanding, returned demonstration, verbal cues required, tactile cues required, and needs further education  HOME EXERCISE PROGRAM: [id number] Hip traction by standing on left foot on elevated surface [id number]- lumbar flexion program  ASSESSMENT:  CLINICAL IMPRESSION: And gait analysis notable that hip presents with appropriate posture from the lateral aspect.  From the anterior and posterior aspect heel whip is notable creating excess rotation and swing through phase of gait.  Has  the injection scheduled for her lumbar spine in early February.  Depending on the outcome will determine if hip intra-articular injection is necessary.  Will extend plan of care to continue addressing gait dysfunction as well as decreasing symptoms of both spine and hip input to functional limitations.  OBJECTIVE IMPAIRMENTS: decreased activity tolerance, decreased ROM, increased muscle spasms, improper body mechanics, and pain.   ACTIVITY LIMITATIONS: carrying, lifting, bending, sitting, squatting, sleeping, stairs, and locomotion level  PARTICIPATION LIMITATIONS: meal prep, cleaning, laundry, driving, shopping, community activity, and occupation  PERSONAL FACTORS: 1-2 comorbidities: chronic back pain, known degeneration in hip, low bone density  are also affecting patient's functional outcome.   GOALS: Goals reviewed with patient? Yes  SHORT TERM GOALS: Target date: 08/23/2022  Determine need to change POC following surgical visit Baseline: Goal status: achieved    LONG TERM GOALS: Target date: POC date  Able to sleep without interruption by hip pain Baseline:  Goal status: Ongoing  2.  Able to sit for at least 20 min without increased hip pain Baseline:  Goal status: Ongoing  3.  Hip abd strength 90% of opp LE Baseline:  Goal status: Not tested today  4.  Able to navigate stairs with minimal pain Baseline: Depending on the day can have more or less difficulty and hip flexion to reach the neck step, unable to progress with the left lower extremity to ascend stairs without significant increase in pain Goal status: Ongoing   PLAN:  PT FREQUENCY: 1-2x/week  PT DURATION:8 weeks  PLANNED INTERVENTIONS: Therapeutic exercises, Therapeutic activity, Neuromuscular re-education, Balance training, Gait training, Patient/Family education, Self Care, Joint mobilization, Stair training, Aquatic Therapy, Dry Needling, Electrical stimulation, Spinal mobilization, Cryotherapy, Moist  heat, Taping, Ionotophoresis '4mg'$ /ml Dexamethasone, Manual therapy, and Re-evaluation  PLAN FOR NEXT SESSION: Dry needling as needed, core stabilization, working to reeducate and reduce heel whip in swing phase of gait   Kalvyn Desa C. Sherif Millspaugh PT, DPT 10/08/22 9:20 AM

## 2022-10-11 ENCOUNTER — Encounter (HOSPITAL_BASED_OUTPATIENT_CLINIC_OR_DEPARTMENT_OTHER): Payer: 59 | Admitting: Physical Therapy

## 2022-10-11 ENCOUNTER — Encounter (HOSPITAL_BASED_OUTPATIENT_CLINIC_OR_DEPARTMENT_OTHER): Payer: Self-pay

## 2022-10-13 ENCOUNTER — Encounter (HOSPITAL_BASED_OUTPATIENT_CLINIC_OR_DEPARTMENT_OTHER): Payer: 59 | Admitting: Physical Therapy

## 2022-10-14 ENCOUNTER — Other Ambulatory Visit (HOSPITAL_COMMUNITY): Payer: Self-pay

## 2022-10-14 DIAGNOSIS — F411 Generalized anxiety disorder: Secondary | ICD-10-CM | POA: Diagnosis not present

## 2022-10-14 DIAGNOSIS — F332 Major depressive disorder, recurrent severe without psychotic features: Secondary | ICD-10-CM | POA: Diagnosis not present

## 2022-10-15 ENCOUNTER — Other Ambulatory Visit (HOSPITAL_COMMUNITY): Payer: Self-pay

## 2022-10-15 ENCOUNTER — Other Ambulatory Visit: Payer: Self-pay

## 2022-10-21 DIAGNOSIS — F5081 Binge eating disorder: Secondary | ICD-10-CM | POA: Diagnosis not present

## 2022-10-21 DIAGNOSIS — Z6826 Body mass index (BMI) 26.0-26.9, adult: Secondary | ICD-10-CM | POA: Diagnosis not present

## 2022-10-21 DIAGNOSIS — Z8639 Personal history of other endocrine, nutritional and metabolic disease: Secondary | ICD-10-CM | POA: Diagnosis not present

## 2022-10-21 DIAGNOSIS — E663 Overweight: Secondary | ICD-10-CM | POA: Diagnosis not present

## 2022-10-21 DIAGNOSIS — M5432 Sciatica, left side: Secondary | ICD-10-CM | POA: Diagnosis not present

## 2022-10-22 ENCOUNTER — Ambulatory Visit
Admission: RE | Admit: 2022-10-22 | Discharge: 2022-10-22 | Disposition: A | Discharge status: Home / Self Care | Payer: 59 | Source: Ambulatory Visit | Attending: Family Medicine | Admitting: Family Medicine

## 2022-10-22 ENCOUNTER — Ambulatory Visit (HOSPITAL_BASED_OUTPATIENT_CLINIC_OR_DEPARTMENT_OTHER): Payer: Self-pay | Admitting: Orthopaedic Surgery

## 2022-10-22 DIAGNOSIS — M5116 Intervertebral disc disorders with radiculopathy, lumbar region: Secondary | ICD-10-CM | POA: Diagnosis not present

## 2022-10-22 DIAGNOSIS — M1612 Unilateral primary osteoarthritis, left hip: Secondary | ICD-10-CM

## 2022-10-22 DIAGNOSIS — M999 Biomechanical lesion, unspecified: Secondary | ICD-10-CM

## 2022-10-22 DIAGNOSIS — M9903 Segmental and somatic dysfunction of lumbar region: Secondary | ICD-10-CM

## 2022-10-22 DIAGNOSIS — M7062 Trochanteric bursitis, left hip: Secondary | ICD-10-CM

## 2022-10-22 MED ORDER — IOPAMIDOL (ISOVUE-M 200) INJECTION 41%
1.0000 mL | Freq: Once | INTRAMUSCULAR | Status: AC
  Administered 2022-10-22: 1 mL via EPIDURAL

## 2022-10-22 MED ORDER — METHYLPREDNISOLONE ACETATE 40 MG/ML INJ SUSP (RADIOLOG
80.0000 mg | Freq: Once | INTRAMUSCULAR | Status: AC
  Administered 2022-10-22: 80 mg via EPIDURAL

## 2022-10-22 NOTE — Discharge Instructions (Signed)

## 2022-10-23 ENCOUNTER — Other Ambulatory Visit (HOSPITAL_COMMUNITY): Payer: Self-pay

## 2022-10-23 MED ORDER — ZEPBOUND 7.5 MG/0.5ML ~~LOC~~ SOAJ
7.5000 mg | SUBCUTANEOUS | 1 refills | Status: DC
  Filled 2022-10-23 – 2022-10-26 (×2): qty 2, 28d supply, fill #0
  Filled 2022-11-17: qty 2, 28d supply, fill #1

## 2022-10-26 ENCOUNTER — Other Ambulatory Visit: Payer: Self-pay

## 2022-10-26 ENCOUNTER — Other Ambulatory Visit (HOSPITAL_COMMUNITY): Payer: Self-pay

## 2022-10-28 DIAGNOSIS — F332 Major depressive disorder, recurrent severe without psychotic features: Secondary | ICD-10-CM | POA: Diagnosis not present

## 2022-10-28 DIAGNOSIS — F411 Generalized anxiety disorder: Secondary | ICD-10-CM | POA: Diagnosis not present

## 2022-10-28 NOTE — Therapy (Signed)
OUTPATIENT PHYSICAL THERAPY LOWER EXTREMITY TREATMENT   Patient Name: Tammy [name] MRN: [id number] DOB:[birth year], 62 y.o., female Today's Date: 10/29/2022   PT End of Session - 10/29/22 1238     Visit Number 16    Number of Visits 31    Date for PT Re-Evaluation 12/03/22    Authorization Type [place]    Authorization - Number of Visits 25    PT Start Time [id number]    PT Stop Time 1315    PT Time Calculation (min) 40 min    Activity Tolerance Patient tolerated treatment well    Behavior During Therapy [facility] for tasks assessed/performed                 Past Medical History:  Diagnosis Date   Arthritis    Chronic fatigue syndrome    Diverticulosis    External hemorrhoids    Hypertension    Internal hemorrhoids    Neuropathy    Spinal stenosis    Past Surgical History:  Procedure Laterality Date   CARDIAC CATHETERIZATION  09/18/2012   LEFT HEART CATHETERIZATION WITH CORONARY ANGIOGRAM N/A 09/18/2012   Procedure: LEFT HEART CATHETERIZATION WITH CORONARY ANGIOGRAM;  Surgeon: Sherren Mocha, MD;  Location: [facility] CATH LAB;  Service: Cardiovascular;  Laterality: N/A;   TONSILLECTOMY  1965   Patient Active Problem List   Diagnosis Date Noted   Arthritis of left hip 06/25/2022   Hip flexor tendon tightness, left 04/02/2022   Greater trochanteric bursitis of left hip 01/11/2022   Anxiety 07/24/2020   Nonallopathic lesion of cervical region 04/03/2020   Nonallopathic lesion of thoracic region 04/03/2020   Nonallopathic lesion of lumbar region 04/03/2020   Cervical stenosis of spine 03/31/2020   Attention deficit disorder (ADD) in adult 03/31/2020   Low bone density 03/17/2020   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[place]) 02/15/2020   GERD (gastroesophageal reflux disease) 02/15/2020   Allergic rhinitis 03/09/2016   Essential hypertension 02/18/2014   Chronic pain syndrome 02/22/2013   Chronic insomnia 02/22/2013   Paresthesias 10/25/2012   Hyperlipidemia  09/18/2012     REFERRING PROVIDER: [name], DO  REFERRING DIAG: [id number] (ICD-10-CM) - Greater trochanteric bursitis of left hip  THERAPY DIAG:  It band syndrome, left  Pain in left hip  Difficulty in walking, not elsewhere classified  Rationale for Evaluation and Treatment Rehabilitation  ONSET DATE: subacute on chronic  SUBJECTIVE:   SUBJECTIVE STATEMENT: Had the injection last Friday which helped with the nerve pain down the leg. It was really good the first few days but did feel a little into my knee when driving now. Hip flexion against gravity is better- still hurts but is better. Still catches and feels like the muscle.   PERTINENT HISTORY: LBP, low bone density PAIN:  Are you having pain? Yes: NPRS scale: 2 sitting, up stairs 5/10 Pain location: Lt leg Pain description: sharp, achey Aggravating factors: sitting, going up stairs Relieving factors: standing  PRECAUTIONS: None  WEIGHT BEARING RESTRICTIONS: No  FALLS:  Has patient fallen in last 6 months? No   OCCUPATION: psychologist  PLOF: Independent  PATIENT GOALS: decrease pain   OBJECTIVE:   DIAGNOSTIC FINDINGS:  MRI pelvis 06/19/22 IMPRESSION: 1. High-grade partial-thickness cartilage loss with areas of full-thickness cartilage loss of the left femoral head and acetabulum with mild subchondral reactive marrow edema in the superior anterior acetabulum consistent with moderate-severe osteoarthritis. 2. Partial-thickness cartilage loss of the right femoral head and acetabulum. 3. Right anterior labral tear.  Left anterior labral tear with a 10 mm paralabral cyst. 4. Broad-based disc bulge at L4-5 with a central annular fissure. Mild left and moderate right facet arthropathy at L4-5.  MRI lumbar spine 06/19/22:  T12- L1: Unremarkable.   L1-L2: Mild disc bulging   L2-L3: Mild disc bulging and facet spurring   L3-L4: Mild disc bulging with posterior annular fissure and  shallow protrusion. Patent canal and foramina   L4-L5: Facet spurring and borderline anterolisthesis. Mild disc bulging with shallow central protrusion at an annular fissure. The protrusion is mildly regressed and spinal canal patency is improved, especially at the subarticular recesses   L5-S1:Moderate facet spurring on the right.  PALPATION: Tightness in left hip flexors, reduced spring in post rotation of Rt innom.   LOWER EXTREMITY ROM:  10/08/22: Active range of motion into hip flexion limited by pain but is able to demonstrate at least 90 degrees  LOWER EXTREMITY MMT:  10/08/22: Due to pain levels that does not feel appropriate to test strength with hand-held dynamometer.  Is able to demonstrate active range of motion against gravity without difficulty.  Discomfort notable in hip with repetitive lifts against gravity.  Pain limits strength creation to ascend stairs with left lower extremity.  Spends most of her day in standing due to pain in a seated position indicating functional endurance to large posture supporting muscles.  GAIT: 09/21/22: heel whip bil with lateral to medial heel strike    TODAY'S TREATMENT                                                                            Treatment                            10/29/22:   Hooklying bent knee fallout, alt Alt hooklying knee ext to knee to chest SLR- neutral & ER with cues for core to keep pelvis level Alt ball bw knees with LAQ Alt UE horiz abd 3lb Combo of previous 3lb overhead lat pull bil UEs Seated HSS, piriformis stretch   Treatment                            10/08/22:  Education regarding diagnostics of lumbar and hip intra-articular injections Gait analysis and slow motion using iPad Kinesiotape to posterior tibialis and plantarflexion peroneals in dorsiflexion and eversion, anterior tibialis and dorsiflexion Segmented first half of the swing through phase of gait with left foot to reduce heel whip Edge  of step calf stretch and heel raises with a ball between the ankles to reduce supination of calcaneus   Treatment                            10/05/22:  Trigger Point Dry Needling, Manual Therapy Treatment:  Initial or subsequent education regarding Trigger Point Dry Needling: Subsequent Did patient give consent to treatment with Trigger Point Dry Needling: Yes TPDN with skilled palpation and monitoring followed by STM to the following muscles: L4 paraspinals on the Right, left TFL in right side-lying over pillow Prone mobilization of L4 to encourage  a right to left rotation of the spinous process Placed tape L4 vertebrae  Lateral distraction of the left femoral acetabular joint in hook lying at various angles of external rotation.  Kept this a grade 3 to avoid irritation Continued discussion on symptom awareness through the day.  Encouraged her to look at a picture of dermatome patterns versus trigger point referral patterns when she is feeling pain Standing glut activation for upright posture  PATIENT EDUCATION:  Education details: Anatomy of condition, POC, HEP, exercise form/rationale Person educated: Patient Education method: Explanation, Demonstration, Tactile cues, Verbal cues, and Handouts Education comprehension: verbalized understanding, returned demonstration, verbal cues required, tactile cues required, and needs further education  HOME EXERCISE PROGRAM: [id number] Hip traction by standing on left foot on elevated surface [id number]- lumbar flexion program  ASSESSMENT:  CLINICAL IMPRESSION: Concordant pain consistent with input from neurological structures were improved following guided injection at L5-S1.  Continues to demonstrate musculoskeletal limitations in active range of motion of hip but mostly in a standing position.  Notable difficulty maintaining a level pelvis and a hook lying position when challenged to move lower extremities indicating need for core stability to  provide support to lumbar spine and sacroiliac joints.  Patient does not want to have surgery, she wants to be able to do her power walks and lap swimming for exercise as well as participate in her active lifestyle that has reduced recently due to the pain in her left hip and leg.  Home exercise program today focused on core stability as well as review of stretching.  Patient will continue to do lap swimming as long as it does not increase concordant pain.  Will continue plan of care with focus on treatment of hip symptoms that limit functional ability and participation in her regular/a healthy lifestyle  OBJECTIVE IMPAIRMENTS: decreased activity tolerance, decreased ROM, increased muscle spasms, improper body mechanics, and pain.   ACTIVITY LIMITATIONS: carrying, lifting, bending, sitting, squatting, sleeping, stairs, and locomotion level  PARTICIPATION LIMITATIONS: meal prep, cleaning, laundry, driving, shopping, community activity, and occupation  PERSONAL FACTORS: 1-2 comorbidities: chronic back pain, known degeneration in hip, low bone density  are also affecting patient's functional outcome.   GOALS: Goals reviewed with patient? Yes  SHORT TERM GOALS: Target date: 08/23/2022  Determine need to change POC following surgical visit Baseline: Goal status: achieved    LONG TERM GOALS: Target date: POC date  Able to sleep without interruption by hip pain Baseline:  Goal status: Ongoing  2.  Able to sit for at least 20 min without increased hip pain Baseline:  Goal status: Ongoing  3.  Hip abd strength 90% of opp LE Baseline:  Goal status: Not tested today  4.  Able to navigate stairs with minimal pain Baseline: It is a little bit better following lumbar injection, but does note quick fatigue Goal status: Ongoing  5.  Patient will be able to return to her walking program for exercise with the ability to walk for at least 30 minutes without limitation by hip pain Baseline:  Goal  status: New    PLAN:  PT FREQUENCY: 1-2x/week  PT DURATION:8 weeks  PLANNED INTERVENTIONS: Therapeutic exercises, Therapeutic activity, Neuromuscular re-education, Balance training, Gait training, Patient/Family education, Self Care, Joint mobilization, Stair training, Aquatic Therapy, Dry Needling, Electrical stimulation, Spinal mobilization, Cryotherapy, Moist heat, Taping, Ionotophoresis 35m/ml Dexamethasone, Manual therapy, and Re-evaluation  PLAN FOR NEXT SESSION: Dry needling as needed, core stabilization  Chaselyn Nanney C. Anaiya Wisinski PT, DPT 10/29/22 8:52 PM

## 2022-10-29 ENCOUNTER — Encounter (HOSPITAL_BASED_OUTPATIENT_CLINIC_OR_DEPARTMENT_OTHER): Payer: Self-pay | Admitting: Physical Therapy

## 2022-10-29 ENCOUNTER — Other Ambulatory Visit (HOSPITAL_COMMUNITY): Payer: Self-pay

## 2022-10-29 ENCOUNTER — Ambulatory Visit (HOSPITAL_BASED_OUTPATIENT_CLINIC_OR_DEPARTMENT_OTHER): Payer: 59 | Attending: Family Medicine | Admitting: Physical Therapy

## 2022-10-29 DIAGNOSIS — M25552 Pain in left hip: Secondary | ICD-10-CM | POA: Insufficient documentation

## 2022-10-29 DIAGNOSIS — M7632 Iliotibial band syndrome, left leg: Secondary | ICD-10-CM | POA: Insufficient documentation

## 2022-10-29 DIAGNOSIS — M7062 Trochanteric bursitis, left hip: Secondary | ICD-10-CM | POA: Insufficient documentation

## 2022-10-29 DIAGNOSIS — R262 Difficulty in walking, not elsewhere classified: Secondary | ICD-10-CM | POA: Diagnosis not present

## 2022-11-01 ENCOUNTER — Other Ambulatory Visit (HOSPITAL_COMMUNITY): Payer: Self-pay

## 2022-11-02 ENCOUNTER — Encounter (HOSPITAL_BASED_OUTPATIENT_CLINIC_OR_DEPARTMENT_OTHER): Payer: 59 | Admitting: Physical Therapy

## 2022-11-04 DIAGNOSIS — Z8639 Personal history of other endocrine, nutritional and metabolic disease: Secondary | ICD-10-CM | POA: Diagnosis not present

## 2022-11-04 DIAGNOSIS — R632 Polyphagia: Secondary | ICD-10-CM | POA: Diagnosis not present

## 2022-11-04 DIAGNOSIS — Z6826 Body mass index (BMI) 26.0-26.9, adult: Secondary | ICD-10-CM | POA: Diagnosis not present

## 2022-11-04 DIAGNOSIS — E663 Overweight: Secondary | ICD-10-CM | POA: Diagnosis not present

## 2022-11-04 DIAGNOSIS — F5081 Binge eating disorder: Secondary | ICD-10-CM | POA: Diagnosis not present

## 2022-11-04 DIAGNOSIS — M5410 Radiculopathy, site unspecified: Secondary | ICD-10-CM | POA: Diagnosis not present

## 2022-11-05 ENCOUNTER — Ambulatory Visit (HOSPITAL_BASED_OUTPATIENT_CLINIC_OR_DEPARTMENT_OTHER): Payer: 59 | Admitting: Physical Therapy

## 2022-11-05 ENCOUNTER — Encounter (HOSPITAL_BASED_OUTPATIENT_CLINIC_OR_DEPARTMENT_OTHER): Payer: Self-pay | Admitting: Physical Therapy

## 2022-11-05 DIAGNOSIS — M7062 Trochanteric bursitis, left hip: Secondary | ICD-10-CM | POA: Diagnosis not present

## 2022-11-05 DIAGNOSIS — M25552 Pain in left hip: Secondary | ICD-10-CM

## 2022-11-05 DIAGNOSIS — R262 Difficulty in walking, not elsewhere classified: Secondary | ICD-10-CM

## 2022-11-05 DIAGNOSIS — M7632 Iliotibial band syndrome, left leg: Secondary | ICD-10-CM

## 2022-11-05 NOTE — Therapy (Signed)
OUTPATIENT PHYSICAL THERAPY LOWER EXTREMITY TREATMENT   Patient Name: Tammy [name] MRN: [id number] DOB:Apr 29, 1961, 62 y.o., female Today's Date: 11/05/2022   PT End of Session - 11/05/22 1153     Visit Number 17    Number of Visits 31    Date for PT Re-Evaluation 12/03/22    Authorization Type [place]    Authorization - Number of Visits 25    PT Start Time 1152    PT Stop Time 1231    PT Time Calculation (min) 39 min    Activity Tolerance Patient tolerated treatment well    Behavior During Therapy [facility] for tasks assessed/performed                 Past Medical History:  Diagnosis Date   Arthritis    Chronic fatigue syndrome    Diverticulosis    External hemorrhoids    Hypertension    Internal hemorrhoids    Neuropathy    Spinal stenosis    Past Surgical History:  Procedure Laterality Date   CARDIAC CATHETERIZATION  09/18/2012   LEFT HEART CATHETERIZATION WITH CORONARY ANGIOGRAM N/A 09/18/2012   Procedure: LEFT HEART CATHETERIZATION WITH CORONARY ANGIOGRAM;  Surgeon: Sherren Mocha, MD;  Location: [facility] CATH LAB;  Service: Cardiovascular;  Laterality: N/A;   TONSILLECTOMY  1965   Patient Active Problem List   Diagnosis Date Noted   Arthritis of left hip 06/25/2022   Hip flexor tendon tightness, left 04/02/2022   Greater trochanteric bursitis of left hip 01/11/2022   Anxiety 07/24/2020   Nonallopathic lesion of cervical region 04/03/2020   Nonallopathic lesion of thoracic region 04/03/2020   Nonallopathic lesion of lumbar region 04/03/2020   Cervical stenosis of spine 03/31/2020   Attention deficit disorder (ADD) in adult 03/31/2020   Low bone density 03/17/2020   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[place]) 02/15/2020   GERD (gastroesophageal reflux disease) 02/15/2020   Allergic rhinitis 03/09/2016   Essential hypertension 02/18/2014   Chronic pain syndrome 02/22/2013   Chronic insomnia 02/22/2013   Paresthesias 10/25/2012   Hyperlipidemia  09/18/2012     REFERRING PROVIDER: [name], DO  REFERRING DIAG: M70.62 (ICD-10-CM) - Greater trochanteric bursitis of left hip  THERAPY DIAG:  It band syndrome, left  Pain in left hip  Difficulty in walking, not elsewhere classified  Rationale for Evaluation and Treatment Rehabilitation  ONSET DATE: subacute on chronic  SUBJECTIVE:   SUBJECTIVE STATEMENT: Flutter kick in lap swimming may have slight discomfort but generally feels less tight. Feels better in standing. Walked 15 min with incr pain on lateral hip, groin, and superior thigh and had to limp up incline in driveway. Still feels better since injection.   PERTINENT HISTORY: LBP, low bone density PAIN:  Are you having pain? Yes: NPRS scale: 0/10 Pain location: Lt leg Pain description: sharp, achey Aggravating factors: sitting- cannot last for 1 hour Relieving factors: standing  PRECAUTIONS: None  WEIGHT BEARING RESTRICTIONS: No  FALLS:  Has patient fallen in last 6 months? No   OCCUPATION: psychologist  PLOF: Independent  PATIENT GOALS: decrease pain   OBJECTIVE:   DIAGNOSTIC FINDINGS:  MRI pelvis 06/19/22 IMPRESSION: 1. High-grade partial-thickness cartilage loss with areas of full-thickness cartilage loss of the left femoral head and acetabulum with mild subchondral reactive marrow edema in the superior anterior acetabulum consistent with moderate-severe osteoarthritis. 2. Partial-thickness cartilage loss of the right femoral head and acetabulum. 3. Right anterior labral tear. Left anterior labral tear with a 10 mm paralabral  cyst. 4. Broad-based disc bulge at L4-5 with a central annular fissure. Mild left and moderate right facet arthropathy at L4-5.  MRI lumbar spine 06/19/22:  T12- L1: Unremarkable.   L1-L2: Mild disc bulging   L2-L3: Mild disc bulging and facet spurring   L3-L4: Mild disc bulging with posterior annular fissure and shallow protrusion. Patent canal and  foramina   L4-L5: Facet spurring and borderline anterolisthesis. Mild disc bulging with shallow central protrusion at an annular fissure. The protrusion is mildly regressed and spinal canal patency is improved, especially at the subarticular recesses   L5-S1:Moderate facet spurring on the right.  PALPATION: Tightness in left hip flexors, reduced spring in post rotation of Rt innom.   LOWER EXTREMITY ROM:  10/08/22: Active range of motion into hip flexion limited by pain but is able to demonstrate at least 90 degrees  LOWER EXTREMITY MMT:  10/08/22: Due to pain levels that does not feel appropriate to test strength with hand-held dynamometer.  Is able to demonstrate active range of motion against gravity without difficulty.  Discomfort notable in hip with repetitive lifts against gravity.  Pain limits strength creation to ascend stairs with left lower extremity.  Spends most of her day in standing due to pain in a seated position indicating functional endurance to large posture supporting muscles.  GAIT: 09/21/22: heel whip bil with lateral to medial heel strike    TODAY'S TREATMENT                                                                            Treatment                            11/05/22:  Supine figure 4 Active hamstring stretch Sidelying ITB stretch Bent knee fallout with core engagement Hooklying marching Quadruped: ab set, rocking, hip ext   Treatment                            10/29/22:   Hooklying bent knee fallout, alt Alt hooklying knee ext to knee to chest SLR- neutral & ER with cues for core to keep pelvis level Alt ball bw knees with LAQ Alt UE horiz abd 3lb Combo of previous 3lb overhead lat pull bil UEs Seated HSS, piriformis stretch   Treatment                            10/08/22:  Education regarding diagnostics of lumbar and hip intra-articular injections Gait analysis and slow motion using iPad Kinesiotape to posterior tibialis and  plantarflexion peroneals in dorsiflexion and eversion, anterior tibialis and dorsiflexion Segmented first half of the swing through phase of gait with left foot to reduce heel whip Edge of step calf stretch and heel raises with a ball between the ankles to reduce supination of calcaneus    PATIENT EDUCATION:  Education details: Anatomy of condition, POC, HEP, exercise form/rationale Person educated: Patient Education method: Explanation, Demonstration, Tactile cues, Verbal cues, and Handouts Education comprehension: verbalized understanding, returned demonstration, verbal cues required, tactile cues required, and needs further education  HOME EXERCISE  PROGRAM: [id number] Hip traction by standing on left foot on elevated surface [id number]- lumbar flexion program  ASSESSMENT:  CLINICAL IMPRESSION: Patient reports continued benefits from lumbar injection.  She is doing her lap swimming on a regular basis and denies creation of concordant pain and doing prone flutter kick, but does note that walking increases pain in groin and lateral hip.  She does feel better in standing and increased pain with sitting.  The symptoms considered, it is difficult to say whether the hip or the lumbar spine are the primary issue, but it does not appear that the hip flexor musculature/tendons/ligaments are creating concordant pain.  Overall reports indicate centralization toward the left hip versus distal symptoms into left lower extremity like she used to experience.  Limited strength and stability from core musculature which we will continue to challenge as tolerated  OBJECTIVE IMPAIRMENTS: decreased activity tolerance, decreased ROM, increased muscle spasms, improper body mechanics, and pain.   ACTIVITY LIMITATIONS: carrying, lifting, bending, sitting, squatting, sleeping, stairs, and locomotion level  PARTICIPATION LIMITATIONS: meal prep, cleaning, laundry, driving, shopping, community activity, and  occupation  PERSONAL FACTORS: 1-2 comorbidities: chronic back pain, known degeneration in hip, low bone density  are also affecting patient's functional outcome.   GOALS: Goals reviewed with patient? Yes  SHORT TERM GOALS: Target date: 08/23/2022  Determine need to change POC following surgical visit Baseline: Goal status: achieved    LONG TERM GOALS: Target date: POC date  Able to sleep without interruption by hip pain Baseline:  Goal status: Ongoing  2.  Able to sit for at least 20 min without increased hip pain Baseline:  Goal status: Ongoing  3.  Hip abd strength 90% of opp LE Baseline:  Goal status: Not tested today  4.  Able to navigate stairs with minimal pain Baseline: It is a little bit better following lumbar injection, but does note quick fatigue Goal status: Ongoing  5.  Patient will be able to return to her walking program for exercise with the ability to walk for at least 30 minutes without limitation by hip pain Baseline:  Goal status: New    PLAN:  PT FREQUENCY: 1-2x/week  PT DURATION:8 weeks  PLANNED INTERVENTIONS: Therapeutic exercises, Therapeutic activity, Neuromuscular re-education, Balance training, Gait training, Patient/Family education, Self Care, Joint mobilization, Stair training, Aquatic Therapy, Dry Needling, Electrical stimulation, Spinal mobilization, Cryotherapy, Moist heat, Taping, Ionotophoresis 61m/ml Dexamethasone, Manual therapy, and Re-evaluation  PLAN FOR NEXT SESSION: Dry needling as needed, core stabilization  Bedelia Pong C. Cinque Begley PT, DPT 11/05/22 2:22 PM

## 2022-11-09 ENCOUNTER — Ambulatory Visit (HOSPITAL_BASED_OUTPATIENT_CLINIC_OR_DEPARTMENT_OTHER): Payer: 59 | Admitting: Physical Therapy

## 2022-11-09 ENCOUNTER — Encounter (HOSPITAL_BASED_OUTPATIENT_CLINIC_OR_DEPARTMENT_OTHER): Payer: Self-pay | Admitting: Physical Therapy

## 2022-11-09 DIAGNOSIS — M7632 Iliotibial band syndrome, left leg: Secondary | ICD-10-CM | POA: Diagnosis not present

## 2022-11-09 DIAGNOSIS — M25552 Pain in left hip: Secondary | ICD-10-CM | POA: Diagnosis not present

## 2022-11-09 DIAGNOSIS — M7062 Trochanteric bursitis, left hip: Secondary | ICD-10-CM | POA: Diagnosis not present

## 2022-11-09 DIAGNOSIS — R262 Difficulty in walking, not elsewhere classified: Secondary | ICD-10-CM | POA: Diagnosis not present

## 2022-11-09 NOTE — Therapy (Signed)
OUTPATIENT PHYSICAL THERAPY LOWER EXTREMITY TREATMENT   Patient Name: Tammy [name] MRN: [id number] DOB:[birth year], 62 y.o., female Today's Date: 11/09/2022   PT End of Session - 11/09/22 1232     Visit Number 18    Number of Visits 31    Date for PT Re-Evaluation 12/03/22    Authorization Type [place]    Authorization - Number of Visits 25    PT Start Time 1230    PT Stop Time 1312    PT Time Calculation (min) 42 min    Activity Tolerance Patient tolerated treatment well    Behavior During Therapy [facility] for tasks assessed/performed                 Past Medical History:  Diagnosis Date   Arthritis    Chronic fatigue syndrome    Diverticulosis    External hemorrhoids    Hypertension    Internal hemorrhoids    Neuropathy    Spinal stenosis    Past Surgical History:  Procedure Laterality Date   CARDIAC CATHETERIZATION  09/18/2012   LEFT HEART CATHETERIZATION WITH CORONARY ANGIOGRAM N/A 09/18/2012   Procedure: LEFT HEART CATHETERIZATION WITH CORONARY ANGIOGRAM;  Surgeon: Sherren Mocha, MD;  Location: [facility] CATH LAB;  Service: Cardiovascular;  Laterality: N/A;   TONSILLECTOMY  1965   Patient Active Problem List   Diagnosis Date Noted   Arthritis of left hip 06/25/2022   Hip flexor tendon tightness, left 04/02/2022   Greater trochanteric bursitis of left hip 01/11/2022   Anxiety 07/24/2020   Nonallopathic lesion of cervical region 04/03/2020   Nonallopathic lesion of thoracic region 04/03/2020   Nonallopathic lesion of lumbar region 04/03/2020   Cervical stenosis of spine 03/31/2020   Attention deficit disorder (ADD) in adult 03/31/2020   Low bone density 03/17/2020   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[place]) 02/15/2020   GERD (gastroesophageal reflux disease) 02/15/2020   Allergic rhinitis 03/09/2016   Essential hypertension 02/18/2014   Chronic pain syndrome 02/22/2013   Chronic insomnia 02/22/2013   Paresthesias 10/25/2012   Hyperlipidemia  09/18/2012     REFERRING PROVIDER: [name], DO  REFERRING DIAG: M70.62 (ICD-10-CM) - Greater trochanteric bursitis of left hip  THERAPY DIAG:  It band syndrome, left  Pain in left hip  Difficulty in walking, not elsewhere classified  Trochanteric bursitis, left hip  Rationale for Evaluation and Treatment Rehabilitation  ONSET DATE: subacute on chronic  SUBJECTIVE:   SUBJECTIVE STATEMENT: My ribs really hurt.   PERTINENT HISTORY: LBP, low bone density PAIN:  Are you having pain? Yes: NPRS scale: 0/10 Pain location: Lt leg Pain description: sharp, achey Aggravating factors: sitting- cannot last for 1 hour Relieving factors: standing  PRECAUTIONS: None  WEIGHT BEARING RESTRICTIONS: No  FALLS:  Has patient fallen in last 6 months? No   OCCUPATION: psychologist  PLOF: Independent  PATIENT GOALS: decrease pain   OBJECTIVE:   DIAGNOSTIC FINDINGS:  MRI pelvis 06/19/22 IMPRESSION: 1. High-grade partial-thickness cartilage loss with areas of full-thickness cartilage loss of the left femoral head and acetabulum with mild subchondral reactive marrow edema in the superior anterior acetabulum consistent with moderate-severe osteoarthritis. 2. Partial-thickness cartilage loss of the right femoral head and acetabulum. 3. Right anterior labral tear. Left anterior labral tear with a 10 mm paralabral cyst. 4. Broad-based disc bulge at L4-5 with a central annular fissure. Mild left and moderate right facet arthropathy at L4-5.  MRI lumbar spine 06/19/22:  T12- L1: Unremarkable.   L1-L2: Mild disc  bulging   L2-L3: Mild disc bulging and facet spurring   L3-L4: Mild disc bulging with posterior annular fissure and shallow protrusion. Patent canal and foramina   L4-L5: Facet spurring and borderline anterolisthesis. Mild disc bulging with shallow central protrusion at an annular fissure. The protrusion is mildly regressed and spinal canal patency is  improved, especially at the subarticular recesses   L5-S1:Moderate facet spurring on the right.  PALPATION: Tightness in left hip flexors, reduced spring in post rotation of Rt innom.   LOWER EXTREMITY ROM:  10/08/22: Active range of motion into hip flexion limited by pain but is able to demonstrate at least 90 degrees  LOWER EXTREMITY MMT:  10/08/22: Due to pain levels that does not feel appropriate to test strength with hand-held dynamometer.  Is able to demonstrate active range of motion against gravity without difficulty.  Discomfort notable in hip with repetitive lifts against gravity.  Pain limits strength creation to ascend stairs with left lower extremity.  Spends most of her day in standing due to pain in a seated position indicating functional endurance to large posture supporting muscles.  GAIT: 09/21/22: heel whip bil with lateral to medial heel strike    TODAY'S TREATMENT                                                                            Treatment                            11/09/22:  Elliptical Ktape rt rib cage Hooklying toe taps from table top, also progressed to leg extensions Qped triceps kicks 3lb, limited due to rib pain Walked through gym and demonstrated cable machine use and discussed quads, HS and leg press machines   Treatment                            11/05/22:  Supine figure 4 Active hamstring stretch Sidelying ITB stretch Bent knee fallout with core engagement Hooklying marching Quadruped: ab set, rocking, hip ext   Treatment                            10/29/22:   Hooklying bent knee fallout, alt Alt hooklying knee ext to knee to chest SLR- neutral & ER with cues for core to keep pelvis level Alt ball bw knees with LAQ Alt UE horiz abd 3lb Combo of previous 3lb overhead lat pull bil UEs Seated HSS, piriformis stretch    PATIENT EDUCATION:  Education details: Anatomy of condition, POC, HEP, exercise form/rationale Person educated:  Patient Education method: Explanation, Demonstration, Tactile cues, Verbal cues, and Handouts Education comprehension: verbalized understanding, returned demonstration, verbal cues required, tactile cues required, and needs further education  HOME EXERCISE PROGRAM: [id number] Hip traction by standing on left foot on elevated surface [id number]- lumbar flexion program  ASSESSMENT:  CLINICAL IMPRESSION: Limited participation in gym education today due to ribs healing. Discussed ways to modify swimming for a short period of time as large ROM through UEs increases discomfort. The pain levels have continued to stay down since injection and will  spread out her next few appointments as we work toward independent management. Seeing Dr Ninfa Linden soon as f/u from injection.   OBJECTIVE IMPAIRMENTS: decreased activity tolerance, decreased ROM, increased muscle spasms, improper body mechanics, and pain.   ACTIVITY LIMITATIONS: carrying, lifting, bending, sitting, squatting, sleeping, stairs, and locomotion level  PARTICIPATION LIMITATIONS: meal prep, cleaning, laundry, driving, shopping, community activity, and occupation  PERSONAL FACTORS: 1-2 comorbidities: chronic back pain, known degeneration in hip, low bone density  are also affecting patient's functional outcome.   GOALS: Goals reviewed with patient? Yes  SHORT TERM GOALS: Target date: 08/23/2022  Determine need to change POC following surgical visit Baseline: Goal status: achieved    LONG TERM GOALS: Target date: POC date  Able to sleep without interruption by hip pain Baseline:  Goal status: Ongoing  2.  Able to sit for at least 20 min without increased hip pain Baseline:  Goal status: Ongoing  3.  Hip abd strength 90% of opp LE Baseline:  Goal status: Not tested today  4.  Able to navigate stairs with minimal pain Baseline: It is a little bit better following lumbar injection, but does note quick fatigue Goal status:  Ongoing  5.  Patient will be able to return to her walking program for exercise with the ability to walk for at least 30 minutes without limitation by hip pain Baseline:  Goal status: New    PLAN:  PT FREQUENCY: 1-2x/week  PT DURATION:8 weeks  PLANNED INTERVENTIONS: Therapeutic exercises, Therapeutic activity, Neuromuscular re-education, Balance training, Gait training, Patient/Family education, Self Care, Joint mobilization, Stair training, Aquatic Therapy, Dry Needling, Electrical stimulation, Spinal mobilization, Cryotherapy, Moist heat, Taping, Ionotophoresis 55m/ml Dexamethasone, Manual therapy, and Re-evaluation  PLAN FOR NEXT SESSION: Dry needling as needed, core stabilization  Abdulwahab Demelo C. Marigrace Mccole PT, DPT 11/09/22 1:24 PM

## 2022-11-11 ENCOUNTER — Encounter: Payer: Self-pay | Admitting: Orthopaedic Surgery

## 2022-11-11 ENCOUNTER — Ambulatory Visit (INDEPENDENT_AMBULATORY_CARE_PROVIDER_SITE_OTHER): Payer: 59 | Admitting: Orthopaedic Surgery

## 2022-11-11 DIAGNOSIS — F411 Generalized anxiety disorder: Secondary | ICD-10-CM | POA: Diagnosis not present

## 2022-11-11 DIAGNOSIS — M1612 Unilateral primary osteoarthritis, left hip: Secondary | ICD-10-CM | POA: Diagnosis not present

## 2022-11-11 DIAGNOSIS — F332 Major depressive disorder, recurrent severe without psychotic features: Secondary | ICD-10-CM | POA: Diagnosis not present

## 2022-11-11 DIAGNOSIS — M25552 Pain in left hip: Secondary | ICD-10-CM | POA: Diagnosis not present

## 2022-11-11 NOTE — Progress Notes (Signed)
The patient is well-known to me.  She is also followed by Dr. Hulan Saas a sports medicine physician.  She has moderate arthritis of her left hip but most of her issues has not been hip related.  Dr. Tamala Julian set her up for an epidural steroid injection which she had just recently.  She says that is helped her dramatically and she feels much better overall.  Her main exercise is swimming and a lap pool and she actually swims about 72 laps per setting when she does swim.  She said the hip hurts when she is at the end of some walks but otherwise is really not bothering her significantly.  She says the epidural is really help a lot of her issues.  I believe this was the left-sided L4-L5.  It was performed at [facility].  On exam her left hip still moves smoothly and fluidly and has full rotation with no pain in the groin at all.  I told her we should just watch this for now she agrees.  It may end up that she needs a hip replacement at some point if she develops enough pain in the groin and this can affect her posture and detrimentally factor mobility and her quality of life but right now I want her to get back to her swimming and do all her activities that she tolerates.  If she does have worsening problems with her hip she knows to let us know.  All questions and concerns were answered and addressed.

## 2022-11-15 ENCOUNTER — Encounter (HOSPITAL_BASED_OUTPATIENT_CLINIC_OR_DEPARTMENT_OTHER): Payer: 59 | Admitting: Physical Therapy

## 2022-11-17 ENCOUNTER — Other Ambulatory Visit (HOSPITAL_COMMUNITY): Payer: Self-pay

## 2022-11-17 ENCOUNTER — Ambulatory Visit: Payer: Self-pay | Admitting: Psychiatry

## 2022-11-18 ENCOUNTER — Other Ambulatory Visit (HOSPITAL_COMMUNITY): Payer: Self-pay

## 2022-11-18 ENCOUNTER — Other Ambulatory Visit: Payer: Self-pay

## 2022-11-18 DIAGNOSIS — Z8639 Personal history of other endocrine, nutritional and metabolic disease: Secondary | ICD-10-CM | POA: Diagnosis not present

## 2022-11-18 DIAGNOSIS — F5081 Binge eating disorder: Secondary | ICD-10-CM | POA: Diagnosis not present

## 2022-11-18 DIAGNOSIS — E663 Overweight: Secondary | ICD-10-CM | POA: Diagnosis not present

## 2022-11-18 DIAGNOSIS — R632 Polyphagia: Secondary | ICD-10-CM | POA: Diagnosis not present

## 2022-11-18 DIAGNOSIS — Z6826 Body mass index (BMI) 26.0-26.9, adult: Secondary | ICD-10-CM | POA: Diagnosis not present

## 2022-11-18 MED ORDER — ZEPBOUND 15 MG/0.5ML ~~LOC~~ SOAJ
15.0000 mg | SUBCUTANEOUS | 0 refills | Status: DC
Start: 2022-11-18 — End: 2023-02-22
  Filled 2022-11-18: qty 2, 28d supply, fill #0

## 2022-11-19 ENCOUNTER — Ambulatory Visit (HOSPITAL_BASED_OUTPATIENT_CLINIC_OR_DEPARTMENT_OTHER): Payer: 59 | Attending: Family Medicine | Admitting: Physical Therapy

## 2022-11-19 ENCOUNTER — Encounter (HOSPITAL_BASED_OUTPATIENT_CLINIC_OR_DEPARTMENT_OTHER): Payer: Self-pay | Admitting: Physical Therapy

## 2022-11-19 ENCOUNTER — Encounter (HOSPITAL_BASED_OUTPATIENT_CLINIC_OR_DEPARTMENT_OTHER): Payer: 59 | Admitting: Physical Therapy

## 2022-11-19 DIAGNOSIS — M25552 Pain in left hip: Secondary | ICD-10-CM | POA: Diagnosis not present

## 2022-11-19 DIAGNOSIS — M7632 Iliotibial band syndrome, left leg: Secondary | ICD-10-CM | POA: Diagnosis not present

## 2022-11-19 DIAGNOSIS — R262 Difficulty in walking, not elsewhere classified: Secondary | ICD-10-CM | POA: Diagnosis not present

## 2022-11-19 NOTE — Therapy (Signed)
OUTPATIENT PHYSICAL THERAPY LOWER EXTREMITY TREATMENT   Patient Name: Tammy [name] MRN: [id number] DOB:Jan 22, 1961, 62 y.o., female Today's Date: 11/19/2022   PT End of Session - 11/19/22 1322     Visit Number 19    Number of Visits 31    Date for PT Re-Evaluation 12/03/22    Authorization Type [place]    Authorization - Number of Visits 25    PT Start Time 1322    PT Stop Time 1352    PT Time Calculation (min) 30 min    Activity Tolerance Patient tolerated treatment well    Behavior During Therapy [facility] for tasks assessed/performed                 Past Medical History:  Diagnosis Date   Arthritis    Chronic fatigue syndrome    Diverticulosis    External hemorrhoids    Hypertension    Internal hemorrhoids    Neuropathy    Spinal stenosis    Past Surgical History:  Procedure Laterality Date   CARDIAC CATHETERIZATION  09/18/2012   LEFT HEART CATHETERIZATION WITH CORONARY ANGIOGRAM N/A 09/18/2012   Procedure: LEFT HEART CATHETERIZATION WITH CORONARY ANGIOGRAM;  Surgeon: Sherren Mocha, MD;  Location: [facility] CATH LAB;  Service: Cardiovascular;  Laterality: N/A;   TONSILLECTOMY  1965   Patient Active Problem List   Diagnosis Date Noted   Unilateral primary osteoarthritis, left hip 06/25/2022   Hip flexor tendon tightness, left 04/02/2022   Greater trochanteric bursitis of left hip 01/11/2022   Anxiety 07/24/2020   Nonallopathic lesion of cervical region 04/03/2020   Nonallopathic lesion of thoracic region 04/03/2020   Nonallopathic lesion of lumbar region 04/03/2020   Cervical stenosis of spine 03/31/2020   Attention deficit disorder (ADD) in adult 03/31/2020   Low bone density 03/17/2020   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[place]) 02/15/2020   GERD (gastroesophageal reflux disease) 02/15/2020   Allergic rhinitis 03/09/2016   Essential hypertension 02/18/2014   Chronic pain syndrome 02/22/2013   Chronic insomnia 02/22/2013   Paresthesias 10/25/2012    Hyperlipidemia 09/18/2012     REFERRING PROVIDER: [name], DO  REFERRING DIAG: M70.62 (ICD-10-CM) - Greater trochanteric bursitis of left hip  THERAPY DIAG:  Pain in left hip  It band syndrome, left  Difficulty in walking, not elsewhere classified  Rationale for Evaluation and Treatment Rehabilitation  ONSET DATE: subacute on chronic  SUBJECTIVE:   SUBJECTIVE STATEMENT: Feeling some hip symptoms return but overall feeling better. Would like today be last day.  PERTINENT HISTORY: LBP, low bone density PAIN:  Are you having pain? Yes: NPRS scale: 0/10 Pain location: Lt leg Pain description: sharp, achey Aggravating factors: sitting- cannot last for 1 hour Relieving factors: standing  PRECAUTIONS: None  WEIGHT BEARING RESTRICTIONS: No  FALLS:  Has patient fallen in last 6 months? No   OCCUPATION: psychologist  PLOF: Independent  PATIENT GOALS: decrease pain   OBJECTIVE:   DIAGNOSTIC FINDINGS:  MRI pelvis 06/19/22 IMPRESSION: 1. High-grade partial-thickness cartilage loss with areas of full-thickness cartilage loss of the left femoral head and acetabulum with mild subchondral reactive marrow edema in the superior anterior acetabulum consistent with moderate-severe osteoarthritis. 2. Partial-thickness cartilage loss of the right femoral head and acetabulum. 3. Right anterior labral tear. Left anterior labral tear with a 10 mm paralabral cyst. 4. Broad-based disc bulge at L4-5 with a central annular fissure. Mild left and moderate right facet arthropathy at L4-5.  MRI lumbar spine 06/19/22:  T12- L1:  Unremarkable.   L1-L2: Mild disc bulging   L2-L3: Mild disc bulging and facet spurring   L3-L4: Mild disc bulging with posterior annular fissure and shallow protrusion. Patent canal and foramina   L4-L5: Facet spurring and borderline anterolisthesis. Mild disc bulging with shallow central protrusion at an annular fissure. The protrusion is  mildly regressed and spinal canal patency is improved, especially at the subarticular recesses   L5-S1:Moderate facet spurring on the right.  PALPATION: Tightness in left hip flexors, reduced spring in post rotation of Rt innom.   LOWER EXTREMITY ROM:  10/08/22: Active range of motion into hip flexion limited by pain but is able to demonstrate at least 90 degrees  LOWER EXTREMITY MMT:  3/1: lap swimming with flutter kick. Did a walk for 30 min and the pain did not get worse.  GAIT: 09/21/22: heel whip bil with lateral to medial heel strike 3/1: mild heel whip noted on right    TODAY'S TREATMENT                                                                            Treatment                            3/1:  Reviewed HEP & discussed progression to gym   Treatment                            11/09/22:  Elliptical Ktape rt rib cage Hooklying toe taps from table top, also progressed to leg extensions Qped triceps kicks 3lb, limited due to rib pain Walked through gym and demonstrated cable machine use and discussed quads, HS and leg press machines   Treatment                            11/05/22:  Supine figure 4 Active hamstring stretch Sidelying ITB stretch Bent knee fallout with core engagement Hooklying marching Quadruped: ab set, rocking, hip ext   Treatment                            10/29/22:   Hooklying bent knee fallout, alt Alt hooklying knee ext to knee to chest SLR- neutral & ER with cues for core to keep pelvis level Alt ball bw knees with LAQ Alt UE horiz abd 3lb Combo of previous 3lb overhead lat pull bil UEs Seated HSS, piriformis stretch    PATIENT EDUCATION:  Education details: Anatomy of condition, POC, HEP, exercise form/rationale Person educated: Patient Education method: Explanation, Demonstration, Tactile cues, Verbal cues, and Handouts Education comprehension: verbalized understanding, returned demonstration, verbal cues required, tactile  cues required, and needs further education  HOME EXERCISE PROGRAM: [id number] Hip traction by standing on left foot on elevated surface [id number]- lumbar flexion program  ASSESSMENT:  CLINICAL IMPRESSION: Pt is doing very well since her injection and would like for today to be her last visit. She plans to do a fitness session with the fitness specialists so I will write a referral form. She is doing great with her exercises  and was encouraged to focus on what she feels vs what it looks like to ensure proper form. Encouraged her to reach out to me with any further questions/needs.   OBJECTIVE IMPAIRMENTS: decreased activity tolerance, decreased ROM, increased muscle spasms, improper body mechanics, and pain.   ACTIVITY LIMITATIONS: carrying, lifting, bending, sitting, squatting, sleeping, stairs, and locomotion level  PARTICIPATION LIMITATIONS: meal prep, cleaning, laundry, driving, shopping, community activity, and occupation  PERSONAL FACTORS: 1-2 comorbidities: chronic back pain, known degeneration in hip, low bone density  are also affecting patient's functional outcome.   GOALS: Goals reviewed with patient? Yes  SHORT TERM GOALS: Target date: 08/23/2022  Determine need to change POC following surgical visit Baseline: Goal status: achieved    LONG TERM GOALS: Target date: POC date  Able to sleep without interruption by hip pain Baseline:  Goal status: met  2.  Able to sit for at least 20 min without increased hip pain Baseline:  Goal status: partially met  3.  Hip abd strength 90% of opp LE Baseline:  Goal status: Not tested today  4.  Able to navigate stairs with minimal pain Baseline: doing okay, some bothering me today Goal status: partially met  5.  Patient will be able to return to her walking program for exercise with the ability to walk for at least 30 minutes without limitation by hip pain Baseline:  Goal status: partially met    PLAN:  PT FREQUENCY:  1-2x/week  PT DURATION:8 weeks  PLANNED INTERVENTIONS: Therapeutic exercises, Therapeutic activity, Neuromuscular re-education, Balance training, Gait training, Patient/Family education, Self Care, Joint mobilization, Stair training, Aquatic Therapy, Dry Needling, Electrical stimulation, Spinal mobilization, Cryotherapy, Moist heat, Taping, Ionotophoresis '4mg'$ /ml Dexamethasone, Manual therapy, and Re-evaluation  PHYSICAL THERAPY DISCHARGE SUMMARY  Visits from Start of Care: 19  Current functional level related to goals / functional outcomes: See above   Remaining deficits: See above   Education / Equipment: Anatomy of condition, POC, HEP, exercise form/rationale    Patient agrees to discharge. Patient goals were partially met. Patient is being discharged due to being pleased with the current functional level.   Liddie Chichester C. Evangelyn Crouse PT, DPT 11/19/22 2:01 PM

## 2022-11-25 ENCOUNTER — Other Ambulatory Visit (HOSPITAL_COMMUNITY): Payer: Self-pay

## 2022-11-25 ENCOUNTER — Other Ambulatory Visit: Payer: Self-pay

## 2022-11-25 DIAGNOSIS — F332 Major depressive disorder, recurrent severe without psychotic features: Secondary | ICD-10-CM | POA: Diagnosis not present

## 2022-11-25 DIAGNOSIS — F411 Generalized anxiety disorder: Secondary | ICD-10-CM | POA: Diagnosis not present

## 2022-12-09 ENCOUNTER — Other Ambulatory Visit (HOSPITAL_COMMUNITY): Payer: Self-pay

## 2022-12-09 ENCOUNTER — Other Ambulatory Visit: Payer: Self-pay

## 2022-12-09 DIAGNOSIS — Z8639 Personal history of other endocrine, nutritional and metabolic disease: Secondary | ICD-10-CM | POA: Diagnosis not present

## 2022-12-09 DIAGNOSIS — F5081 Binge eating disorder: Secondary | ICD-10-CM | POA: Diagnosis not present

## 2022-12-09 DIAGNOSIS — E663 Overweight: Secondary | ICD-10-CM | POA: Diagnosis not present

## 2022-12-09 DIAGNOSIS — F411 Generalized anxiety disorder: Secondary | ICD-10-CM | POA: Diagnosis not present

## 2022-12-09 DIAGNOSIS — Z6826 Body mass index (BMI) 26.0-26.9, adult: Secondary | ICD-10-CM | POA: Diagnosis not present

## 2022-12-09 DIAGNOSIS — M5410 Radiculopathy, site unspecified: Secondary | ICD-10-CM | POA: Diagnosis not present

## 2022-12-09 DIAGNOSIS — L281 Prurigo nodularis: Secondary | ICD-10-CM | POA: Diagnosis not present

## 2022-12-09 DIAGNOSIS — F332 Major depressive disorder, recurrent severe without psychotic features: Secondary | ICD-10-CM | POA: Diagnosis not present

## 2022-12-09 MED ORDER — ZEPBOUND 15 MG/0.5ML ~~LOC~~ SOAJ
15.0000 mg | SUBCUTANEOUS | 0 refills | Status: DC
Start: 2022-12-09 — End: 2023-02-22
  Filled 2022-12-09: qty 6, 84d supply, fill #0
  Filled 2022-12-11: qty 2, 28d supply, fill #0

## 2022-12-09 MED ORDER — MONTELUKAST SODIUM 10 MG PO TABS
10.0000 mg | ORAL_TABLET | Freq: Every day | ORAL | 0 refills | Status: DC
Start: 2022-12-09 — End: 2023-02-22
  Filled 2022-12-09: qty 30, 30d supply, fill #0

## 2022-12-10 ENCOUNTER — Encounter (HOSPITAL_BASED_OUTPATIENT_CLINIC_OR_DEPARTMENT_OTHER): Payer: 59 | Admitting: Physical Therapy

## 2022-12-10 ENCOUNTER — Other Ambulatory Visit (HOSPITAL_COMMUNITY): Payer: Self-pay

## 2022-12-10 MED ORDER — MUPIROCIN 2 % EX OINT
TOPICAL_OINTMENT | CUTANEOUS | 0 refills | Status: DC
  Filled 2022-12-10: qty 22, 15d supply, fill #0

## 2022-12-11 ENCOUNTER — Other Ambulatory Visit (HOSPITAL_COMMUNITY): Payer: Self-pay

## 2022-12-13 ENCOUNTER — Other Ambulatory Visit: Payer: Self-pay

## 2022-12-16 ENCOUNTER — Encounter: Payer: Self-pay | Admitting: Psychiatry

## 2022-12-16 ENCOUNTER — Other Ambulatory Visit (HOSPITAL_COMMUNITY): Payer: Self-pay

## 2022-12-16 ENCOUNTER — Ambulatory Visit (INDEPENDENT_AMBULATORY_CARE_PROVIDER_SITE_OTHER): Payer: 59 | Admitting: Psychiatry

## 2022-12-16 DIAGNOSIS — F419 Anxiety disorder, unspecified: Secondary | ICD-10-CM | POA: Diagnosis not present

## 2022-12-16 DIAGNOSIS — F988 Other specified behavioral and emotional disorders with onset usually occurring in childhood and adolescence: Secondary | ICD-10-CM | POA: Diagnosis not present

## 2022-12-16 DIAGNOSIS — F3342 Major depressive disorder, recurrent, in full remission: Secondary | ICD-10-CM

## 2022-12-16 MED ORDER — LISDEXAMFETAMINE DIMESYLATE 20 MG PO CAPS
20.0000 mg | ORAL_CAPSULE | Freq: Every day | ORAL | 0 refills | Status: DC
  Filled 2023-02-25: qty 90, 90d supply, fill #0

## 2022-12-16 MED ORDER — ARIPIPRAZOLE 2 MG PO TABS
1.0000 mg | ORAL_TABLET | Freq: Every day | ORAL | 1 refills | Status: DC
  Filled 2022-12-16: qty 90, 90d supply, fill #0
  Filled 2023-03-25: qty 90, 90d supply, fill #1

## 2022-12-16 MED ORDER — FLUOXETINE HCL 10 MG PO CAPS
30.0000 mg | ORAL_CAPSULE | Freq: Every day | ORAL | 1 refills | Status: DC
  Filled 2022-12-16: qty 270, 90d supply, fill #0
  Filled 2023-03-25: qty 270, 90d supply, fill #1

## 2022-12-16 NOTE — Progress Notes (Signed)
Tammy [name] [id number] 06-19-1961 62 y.o.  Subjective:   Patient ID:  Tammy [name] is a 62 y.o. (DOB Sep 15, 1961) female.  Chief Complaint:  Chief Complaint  Patient presents with   Follow-up    Anxiety, depression, ADHD    HPI Tammy [name] presents to the office today for follow-up of anxiety, depression, and ADHD. She reports that her medication remains effective. Denies depressed mood. Has not needed Klonopin prn. She reports minimal anxiety and that it is more  She reports that she lost 20 lbs and is unable to lose more weight. She reports that she is now taking Zepbound instead of Ozempic and notices less improvement in thoughts about eating and weight that she did initially. Appetite remains less overall. Sleep has been good. Concentration has been adequate. Energy and motivation have been good. She swims laps 3 mornings a week. She reports that she finds swimming to be helpful for her anxiety. Denies being socially withdrawn. Denies SI.   Works 4 days a week from home and one day in the office. Enjoys work and reports that she is not interested in retirement. She has been fostering kittens.   Family is doing ok.   Vyvanse last filled 11/25/22.   Past Psychiatric Medication Trials: Cymbalta Zoloft- Interfered with concentration and thinking was not clear Lexapro- Nocturnal panic attacks.   Prozac- Was on 40 mg of Prozac at the time she was having akathisia with Abilify 2 mg. Higher doses seemed to exacerbate akathisia Abilify- helpful. Started on 2 mg daily. Had akathisia and twitching at 5 mg Rexulti- Had very low motivation and fatigue Lamictal- rash Klonopin- Takes as needed. Did not help with rumination. Ambien Vyvanse- took 20 mg in the past for ADHD. Re-started 10 mg dose. Remeron- Gained 20 lbs. Gabapentin- Given for neuropathy and caused cloudy thoughts.  Lamotrigine- tingling/burning around mouth and vagina. No rash or blisters.  May have been helpful for  mood.   Had Genesight testing that indicated adverse effects to Zoloft, Lexapro, and Paxil.  [place] Office Visit from 12/16/2022 in [facility] Office Visit from 06/18/2022 in [facility] Office Visit from 07/29/2021 in [facility] Office Visit from 06/09/2021 in [place] Total Score 0 0 0 0      GAD-7    [facility] Office Visit from 02/18/2021 in [facility]  Total GAD-7 Score 0      PHQ2-9    [place] Office Visit from 02/19/2022 in [place] Visit from 02/18/2021 in [place] Visit from 07/24/2020 in [place] Visit from 03/31/2020 in [place] Visit from 02/15/2020 in [place]  PHQ-2 Total Score 0 0 0 0 0  PHQ-9 Total Score 0 -- 0 0 0      [place] ED from 10/24/2021 in [facility] [place] No Risk        Review of Systems:  Review of Systems  Cardiovascular:  Negative for palpitations.  Musculoskeletal:  Negative for gait problem.  Neurological:  Negative for tremors.  Psychiatric/Behavioral:         Please refer to HPI    Medications: I have reviewed the patient's current medications.  Current Outpatient Medications  Medication Sig Dispense Refill   hydrochlorothiazide (HYDRODIURIL) 25 MG tablet  Take 1 tablet (25 mg total) by mouth daily. 90 tablet 3   lisdexamfetamine (VYVANSE) 20 MG capsule Take 1 capsule by mouth daily. 90 capsule 0   ACETAMINOPHEN-CAFFEINE PO Take by mouth in the morning.     ARIPiprazole (ABILIFY) 2 MG tablet Take 0.5-1 tablets (1-2 mg total) by mouth daily. 90 tablet 1   clonazePAM (KLONOPIN) 0.5 MG tablet Take 1 tablet (0.5 mg total) by mouth daily as needed. 30 tablet 0   COVID-19  mRNA vaccine 2023-2024 (COMIRNATY) syringe Inject into the muscle. 0.3 mL 0   FLUoxetine (PROZAC) 10 MG capsule Take 3 capsules (30 mg total) by mouth daily. 270 capsule 1   lisdexamfetamine (VYVANSE) 20 MG capsule Take 1 capsule (20 mg total) by mouth in the morning. 30 capsule 0   [START ON 02/17/2023] lisdexamfetamine (VYVANSE) 20 MG capsule Take 1 capsule (20 mg total) by mouth daily. 90 capsule 0   MAGNESIUM CHLORIDE-CALCIUM PO Take by mouth.     Melatonin 3 MG CAPS Take 6 mg by mouth at bedtime.     montelukast (SINGULAIR) 10 MG tablet Take 1 tablet (10 mg total) by mouth daily. (Patient not taking: Reported on 12/16/2022) 30 tablet 0   mupirocin ointment (BACTROBAN) 2 % Apply 1 application topically to upper back twice daily 15 days 22 g 0   ondansetron (ZOFRAN-ODT) 4 MG disintegrating tablet Dissolve 1 tablet on the tongue every 8 hours as needed 20 tablet 0   pantoprazole (PROTONIX) 20 MG tablet Take 1 tablet by mouth daily. 90 tablet 3   Probiotic Product (PROBIOTIC DAILY PO) Take by mouth.     Semaglutide-Weight Management (WEGOVY) 0.25 MG/0.5ML SOAJ Inject 0.25 mg 2 mL 0   Semaglutide-Weight Management (WEGOVY) 0.5 MG/0.5ML SOAJ Inject 0.5 mg subcutaneously as directed 2 mL 0   Semaglutide-Weight Management (WEGOVY) 1 MG/0.5ML SOAJ Inject 1 mg into the skin once a week. 2 mL 3   Semaglutide-Weight Management (WEGOVY) 1.7 MG/0.75ML SOAJ Inject 1 pen (1.7 MG) into the skin once a week 3 mL 0   tirzepatide (ZEPBOUND) 15 MG/0.5ML Pen Inject 15 mg into the skin every 7 (seven) days. 96 mL 0   tirzepatide (ZEPBOUND) 15 MG/0.5ML Pen Inject 15 mg into the skin once a week. 6 mL 0   tirzepatide (ZEPBOUND) 2.5 MG/0.5ML Pen Inject 2.5 mg into the skin once a week. 2 mL 2   tirzepatide (ZEPBOUND) 5 MG/0.5ML Pen Inject 5 mg into the skin every 7 (seven) days. 2 mL 0   tirzepatide (ZEPBOUND) 7.5 MG/0.5ML Pen Inject 7.5 mg into the skin once a week. 2 mL 1   No current facility-administered  medications for this visit.    Medication Side Effects: None  Allergies:  Allergies  Allergen Reactions   Egg-Derived Products Other (See Comments)    Makes pt feel like burping up rotten eggs    Past Medical History:  Diagnosis Date   Arthritis    Chronic fatigue syndrome    Diverticulosis    External hemorrhoids    Hypertension    Internal hemorrhoids    Neuropathy    Spinal stenosis     Past Medical History, Surgical history, Social history, and Family history were reviewed and updated as appropriate.   Please see review of systems for further details on the patient's review from today.   Objective:   Physical Exam:  BP 119/70   Pulse 87   LMP 11/05/2007   Physical Exam Constitutional:  General: She is not in acute distress. Musculoskeletal:        General: No deformity.  Neurological:     Mental Status: She is alert and oriented to person, place, and time.     Coordination: Coordination normal.  Psychiatric:        Attention and Perception: Attention and perception normal. She does not perceive auditory or visual hallucinations.        Mood and Affect: Mood normal. Mood is not anxious or depressed. Affect is not labile, blunt, angry or inappropriate.        Speech: Speech normal.        Behavior: Behavior normal.        Thought Content: Thought content normal. Thought content is not paranoid or delusional. Thought content does not include homicidal or suicidal ideation. Thought content does not include homicidal or suicidal plan.        Cognition and Memory: Cognition and memory normal.        Judgment: Judgment normal.     Comments: Insight intact     Lab Review:     Component Value Date/Time   NA 139 02/19/2022 0912   K 4.1 02/19/2022 0912   CL 101 02/19/2022 0912   CO2 27 02/19/2022 0912   GLUCOSE 70 02/19/2022 0912   BUN 16 02/19/2022 0912   CREATININE 0.72 02/19/2022 0912   CREATININE 0.83 02/15/2020 1627   CALCIUM 9.9 02/19/2022 0912    PROT 7.3 02/19/2022 0912   ALBUMIN 4.4 02/19/2022 0912   AST 19 02/19/2022 0912   ALT 19 02/19/2022 0912   ALKPHOS 59 02/19/2022 0912   BILITOT 0.4 02/19/2022 0912   GFRNONAA >90 09/18/2012 0525   GFRAA >90 09/18/2012 0525       Component Value Date/Time   WBC 3.6 (L) 02/19/2022 0912   RBC 4.68 02/19/2022 0912   HGB 12.9 02/19/2022 0912   HCT 38.6 02/19/2022 0912   PLT 204.0 02/19/2022 0912   MCV 82.4 02/19/2022 0912   MCH 28.0 02/15/2020 1627   MCHC 33.3 02/19/2022 0912   RDW 14.7 02/19/2022 0912   LYMPHSABS 0.9 02/19/2022 0912   MONOABS 0.3 02/19/2022 0912   EOSABS 0.2 02/19/2022 0912   BASOSABS 0.0 02/19/2022 0912    No results found for: "POCLITH", "LITHIUM"   No results found for: "PHENYTOIN", "PHENOBARB", "VALPROATE", "CBMZ"   .res Assessment: Plan:    Will continue current plan of care since target signs and symptoms are well controlled without any tolerability issues. Continue Abilify 1 mg po qd for depression.  Continue Prozac 30 mg po qd for depression and anxiety.  Continue Vyvanse 20 mg po qd for ADHD.  Continue Klonopin 0.5 mg qd prn anxiety.  Pt to follow-up in 6 months or sooner if clinically indicated.  Patient advised to contact office with any questions, adverse effects, or acute worsening in signs and symptoms.   Tammy [name] was seen today for follow-up.  Diagnoses and all orders for this visit:  Recurrent major depressive disorder, in full remission ([place]) -     ARIPiprazole (ABILIFY) 2 MG tablet; Take 0.5-1 tablets (1-2 mg total) by mouth daily. -     FLUoxetine (PROZAC) 10 MG capsule; Take 3 capsules (30 mg total) by mouth daily.  Attention deficit disorder (ADD) in adult -     lisdexamfetamine (VYVANSE) 20 MG capsule; Take 1 capsule (20 mg total) by mouth daily.  Anxiety -     FLUoxetine (PROZAC) 10 MG capsule; Take 3 capsules (  30 mg total) by mouth daily.     Please see After Visit Summary for patient specific instructions.  Future  Appointments  Date Time Provider [place]  02/22/2023  8:00 AM [name], MD [facility]  06/20/2023  1:00 PM [facility], PMHNP [facility] None    No orders of the defined types were placed in this encounter.   -------------------------------

## 2022-12-17 ENCOUNTER — Ambulatory Visit: Payer: 59 | Admitting: Psychiatry

## 2022-12-22 DIAGNOSIS — F411 Generalized anxiety disorder: Secondary | ICD-10-CM | POA: Diagnosis not present

## 2022-12-22 DIAGNOSIS — F332 Major depressive disorder, recurrent severe without psychotic features: Secondary | ICD-10-CM | POA: Diagnosis not present

## 2022-12-29 DIAGNOSIS — Z8639 Personal history of other endocrine, nutritional and metabolic disease: Secondary | ICD-10-CM | POA: Diagnosis not present

## 2022-12-29 DIAGNOSIS — M5410 Radiculopathy, site unspecified: Secondary | ICD-10-CM | POA: Diagnosis not present

## 2022-12-29 DIAGNOSIS — E663 Overweight: Secondary | ICD-10-CM | POA: Diagnosis not present

## 2022-12-29 DIAGNOSIS — F5081 Binge eating disorder: Secondary | ICD-10-CM | POA: Diagnosis not present

## 2022-12-30 ENCOUNTER — Other Ambulatory Visit (HOSPITAL_COMMUNITY): Payer: Self-pay

## 2022-12-30 MED ORDER — ZEPBOUND 15 MG/0.5ML ~~LOC~~ SOAJ
15.0000 mg | SUBCUTANEOUS | 0 refills | Status: DC
  Filled 2022-12-30: qty 2, 28d supply, fill #0

## 2023-01-07 ENCOUNTER — Other Ambulatory Visit (HOSPITAL_COMMUNITY): Payer: Self-pay

## 2023-01-07 DIAGNOSIS — L80 Vitiligo: Secondary | ICD-10-CM | POA: Diagnosis not present

## 2023-01-07 DIAGNOSIS — L281 Prurigo nodularis: Secondary | ICD-10-CM | POA: Diagnosis not present

## 2023-01-07 MED ORDER — TACROLIMUS 0.1 % EX OINT
TOPICAL_OINTMENT | CUTANEOUS | 11 refills | Status: DC
  Filled 2023-01-07: qty 30, 30d supply, fill #0

## 2023-01-10 ENCOUNTER — Other Ambulatory Visit: Payer: Self-pay

## 2023-01-12 ENCOUNTER — Other Ambulatory Visit (HOSPITAL_COMMUNITY): Payer: Self-pay

## 2023-01-12 DIAGNOSIS — Z8639 Personal history of other endocrine, nutritional and metabolic disease: Secondary | ICD-10-CM | POA: Diagnosis not present

## 2023-01-12 DIAGNOSIS — F5081 Binge eating disorder: Secondary | ICD-10-CM | POA: Diagnosis not present

## 2023-01-12 DIAGNOSIS — Z6828 Body mass index (BMI) 28.0-28.9, adult: Secondary | ICD-10-CM | POA: Diagnosis not present

## 2023-01-12 DIAGNOSIS — M5410 Radiculopathy, site unspecified: Secondary | ICD-10-CM | POA: Diagnosis not present

## 2023-01-12 DIAGNOSIS — E663 Overweight: Secondary | ICD-10-CM | POA: Diagnosis not present

## 2023-01-12 MED ORDER — ZEPBOUND 15 MG/0.5ML ~~LOC~~ SOAJ: 15.0000 mg | SUBCUTANEOUS | 0 refills | Status: DC

## 2023-01-18 DIAGNOSIS — F332 Major depressive disorder, recurrent severe without psychotic features: Secondary | ICD-10-CM | POA: Diagnosis not present

## 2023-01-18 DIAGNOSIS — F411 Generalized anxiety disorder: Secondary | ICD-10-CM | POA: Diagnosis not present

## 2023-01-22 ENCOUNTER — Encounter (HOSPITAL_COMMUNITY): Admission: EM | Disposition: A | Discharge status: Home / Self Care | Payer: Self-pay | Source: Home / Self Care

## 2023-01-22 ENCOUNTER — Encounter (HOSPITAL_BASED_OUTPATIENT_CLINIC_OR_DEPARTMENT_OTHER): Payer: Self-pay | Admitting: Emergency Medicine

## 2023-01-22 ENCOUNTER — Emergency Department (HOSPITAL_COMMUNITY): Payer: 59 | Admitting: Anesthesiology

## 2023-01-22 ENCOUNTER — Observation Stay (HOSPITAL_BASED_OUTPATIENT_CLINIC_OR_DEPARTMENT_OTHER)
Admission: EM | Admit: 2023-01-22 | Discharge: 2023-01-23 | Disposition: A | Discharge status: Home / Self Care | Payer: 59 | Attending: General Surgery | Admitting: General Surgery

## 2023-01-22 ENCOUNTER — Other Ambulatory Visit: Payer: Self-pay

## 2023-01-22 ENCOUNTER — Emergency Department (HOSPITAL_BASED_OUTPATIENT_CLINIC_OR_DEPARTMENT_OTHER): Payer: 59 | Admitting: Anesthesiology

## 2023-01-22 ENCOUNTER — Emergency Department (HOSPITAL_BASED_OUTPATIENT_CLINIC_OR_DEPARTMENT_OTHER): Payer: 59

## 2023-01-22 DIAGNOSIS — I10 Essential (primary) hypertension: Secondary | ICD-10-CM | POA: Diagnosis not present

## 2023-01-22 DIAGNOSIS — E119 Type 2 diabetes mellitus without complications: Secondary | ICD-10-CM

## 2023-01-22 DIAGNOSIS — K353 Acute appendicitis with localized peritonitis, without perforation or gangrene: Secondary | ICD-10-CM | POA: Diagnosis not present

## 2023-01-22 DIAGNOSIS — Z9049 Acquired absence of other specified parts of digestive tract: Secondary | ICD-10-CM

## 2023-01-22 DIAGNOSIS — F418 Other specified anxiety disorders: Secondary | ICD-10-CM

## 2023-01-22 DIAGNOSIS — R1030 Lower abdominal pain, unspecified: Secondary | ICD-10-CM | POA: Diagnosis not present

## 2023-01-22 DIAGNOSIS — K3532 Acute appendicitis with perforation and localized peritonitis, without abscess: Secondary | ICD-10-CM | POA: Diagnosis not present

## 2023-01-22 DIAGNOSIS — Z9889 Other specified postprocedural states: Secondary | ICD-10-CM

## 2023-01-22 DIAGNOSIS — Z79899 Other long term (current) drug therapy: Secondary | ICD-10-CM | POA: Insufficient documentation

## 2023-01-22 DIAGNOSIS — R1031 Right lower quadrant pain: Secondary | ICD-10-CM | POA: Diagnosis not present

## 2023-01-22 HISTORY — PX: LAPAROSCOPIC APPENDECTOMY: SHX408

## 2023-01-22 LAB — CBC
HCT: 40.7 % (ref 36.0–46.0)
Hemoglobin: 13.5 g/dL (ref 12.0–15.0)
MCH: 27.1 pg (ref 26.0–34.0)
MCHC: 33.2 g/dL (ref 30.0–36.0)
MCV: 81.7 fL (ref 80.0–100.0)
Platelets: 264 10*3/uL (ref 150–400)
RBC: 4.98 MIL/uL (ref 3.87–5.11)
RDW: 12.7 % (ref 11.5–15.5)
WBC: 12.6 10*3/uL — ABNORMAL HIGH (ref 4.0–10.5)
nRBC: 0 % (ref 0.0–0.2)

## 2023-01-22 LAB — COMPREHENSIVE METABOLIC PANEL
ALT: 16 U/L (ref 0–44)
AST: 15 U/L (ref 15–41)
Albumin: 4.6 g/dL (ref 3.5–5.0)
Alkaline Phosphatase: 71 U/L (ref 38–126)
Anion gap: 12 (ref 5–15)
BUN: 16 mg/dL (ref 8–23)
CO2: 26 mmol/L (ref 22–32)
Calcium: 9.6 mg/dL (ref 8.9–10.3)
Chloride: 96 mmol/L — ABNORMAL LOW (ref 98–111)
Creatinine, Ser: 0.87 mg/dL (ref 0.44–1.00)
GFR, Estimated: >60 mL/min (ref >60)
Glucose, Bld: 113 mg/dL — ABNORMAL HIGH (ref 70–99)
Potassium: 3.3 mmol/L — ABNORMAL LOW (ref 3.5–5.1)
Sodium: 134 mmol/L — ABNORMAL LOW (ref 135–145)
Total Bilirubin: 0.8 mg/dL (ref 0.3–1.2)
Total Protein: 7.7 g/dL (ref 6.5–8.1)

## 2023-01-22 LAB — URINALYSIS, ROUTINE W REFLEX MICROSCOPIC
Bilirubin Urine: NEGATIVE
Glucose, UA: NEGATIVE mg/dL
Nitrite: NEGATIVE
Protein, ur: 30 mg/dL — AB
Specific Gravity, Urine: 1.034 — ABNORMAL HIGH (ref 1.005–1.030)
pH: 6.5 (ref 5.0–8.0)

## 2023-01-22 LAB — LIPASE, BLOOD: Lipase: <10 U/L — ABNORMAL LOW (ref 11–51)

## 2023-01-22 SURGERY — APPENDECTOMY, LAPAROSCOPIC
Anesthesia: General

## 2023-01-22 MED ORDER — BUPIVACAINE HCL (PF) 0.25 % IJ SOLN
INTRAMUSCULAR | Status: AC
  Filled 2023-01-22: qty 30

## 2023-01-22 MED ORDER — PROPOFOL 10 MG/ML IV BOLUS
INTRAVENOUS | Status: DC | PRN
  Administered 2023-01-22: 140 mg via INTRAVENOUS

## 2023-01-22 MED ORDER — SODIUM CHLORIDE 0.9 % IR SOLN
Status: DC | PRN
  Administered 2023-01-22: 1000 mL

## 2023-01-22 MED ORDER — TRAMADOL HCL 50 MG PO TABS: 50.0000 mg | ORAL_TABLET | Freq: Four times a day (QID) | ORAL | Status: DC | PRN

## 2023-01-22 MED ORDER — DIPHENHYDRAMINE HCL 50 MG/ML IJ SOLN: 12.5000 mg | Freq: Four times a day (QID) | INTRAMUSCULAR | Status: DC | PRN

## 2023-01-22 MED ORDER — ACETAMINOPHEN 325 MG PO TABS: 325.0000 mg | ORAL_TABLET | ORAL | Status: DC | PRN

## 2023-01-22 MED ORDER — ONDANSETRON HCL 4 MG PO TABS: 4.0000 mg | ORAL_TABLET | Freq: Four times a day (QID) | ORAL | Status: DC | PRN

## 2023-01-22 MED ORDER — ROCURONIUM BROMIDE 100 MG/10ML IV SOLN
INTRAVENOUS | Status: DC | PRN
  Administered 2023-01-22: 50 mg via INTRAVENOUS

## 2023-01-22 MED ORDER — FENTANYL CITRATE (PF) 100 MCG/2ML IJ SOLN
INTRAMUSCULAR | Status: DC | PRN
  Administered 2023-01-22: 100 ug via INTRAVENOUS
  Administered 2023-01-22 (×3): 50 ug via INTRAVENOUS

## 2023-01-22 MED ORDER — OXYCODONE HCL 5 MG PO TABS: 5.0000 mg | ORAL_TABLET | Freq: Once | ORAL | Status: DC | PRN

## 2023-01-22 MED ORDER — ACETAMINOPHEN 500 MG PO TABS
1000.0000 mg | ORAL_TABLET | Freq: Four times a day (QID) | ORAL | Status: DC
  Administered 2023-01-23: 1000 mg via ORAL
  Filled 2023-01-22 (×2): qty 2

## 2023-01-22 MED ORDER — PROMETHAZINE HCL 25 MG/ML IJ SOLN: 6.2500 mg | INTRAMUSCULAR | Status: DC | PRN

## 2023-01-22 MED ORDER — ONDANSETRON HCL 4 MG/2ML IJ SOLN
INTRAMUSCULAR | Status: DC | PRN
  Administered 2023-01-22: 4 mg via INTRAVENOUS

## 2023-01-22 MED ORDER — BUPIVACAINE-EPINEPHRINE (PF) 0.5% -1:200000 IJ SOLN
INTRAMUSCULAR | Status: AC
  Filled 2023-01-22: qty 30

## 2023-01-22 MED ORDER — ALUM & MAG HYDROXIDE-SIMETH 200-200-20 MG/5ML PO SUSP: 30.0000 mL | Freq: Four times a day (QID) | ORAL | Status: DC | PRN

## 2023-01-22 MED ORDER — SIMETHICONE 80 MG PO CHEW: 40.0000 mg | CHEWABLE_TABLET | Freq: Four times a day (QID) | ORAL | Status: DC | PRN

## 2023-01-22 MED ORDER — ACETAMINOPHEN 160 MG/5ML PO SOLN: 325.0000 mg | ORAL | Status: DC | PRN

## 2023-01-22 MED ORDER — MIDAZOLAM HCL 2 MG/2ML IJ SOLN
INTRAMUSCULAR | Status: AC
  Filled 2023-01-22: qty 2

## 2023-01-22 MED ORDER — LACTATED RINGERS IV SOLN: INTRAVENOUS | Status: DC | PRN

## 2023-01-22 MED ORDER — ONDANSETRON HCL 4 MG/2ML IJ SOLN
4.0000 mg | Freq: Once | INTRAMUSCULAR | Status: AC
  Administered 2023-01-22: 4 mg via INTRAVENOUS
  Filled 2023-01-22: qty 2

## 2023-01-22 MED ORDER — ONDANSETRON HCL 4 MG/2ML IJ SOLN: 4.0000 mg | Freq: Four times a day (QID) | INTRAMUSCULAR | Status: DC | PRN

## 2023-01-22 MED ORDER — DIPHENHYDRAMINE HCL 12.5 MG/5ML PO ELIX: 12.5000 mg | ORAL_SOLUTION | Freq: Four times a day (QID) | ORAL | Status: DC | PRN

## 2023-01-22 MED ORDER — IBUPROFEN 600 MG PO TABS: 600.0000 mg | ORAL_TABLET | Freq: Four times a day (QID) | ORAL | Status: DC | PRN

## 2023-01-22 MED ORDER — LIDOCAINE 2% (20 MG/ML) 5 ML SYRINGE
INTRAMUSCULAR | Status: AC
  Filled 2023-01-22: qty 5

## 2023-01-22 MED ORDER — HYDRALAZINE HCL 20 MG/ML IJ SOLN: 10.0000 mg | INTRAMUSCULAR | Status: DC | PRN

## 2023-01-22 MED ORDER — LACTATED RINGERS IV SOLN: INTRAVENOUS | Status: DC

## 2023-01-22 MED ORDER — DEXAMETHASONE SODIUM PHOSPHATE 10 MG/ML IJ SOLN
INTRAMUSCULAR | Status: DC | PRN
  Administered 2023-01-22: 10 mg via INTRAVENOUS

## 2023-01-22 MED ORDER — MORPHINE SULFATE (PF) 4 MG/ML IV SOLN
4.0000 mg | Freq: Once | INTRAVENOUS | Status: AC
  Administered 2023-01-22: 4 mg via INTRAVENOUS
  Filled 2023-01-22: qty 1

## 2023-01-22 MED ORDER — ENSURE SURGERY PO LIQD
237.0000 mL | Freq: Two times a day (BID) | ORAL | Status: DC
  Filled 2023-01-22 (×2): qty 237

## 2023-01-22 MED ORDER — PIPERACILLIN-TAZOBACTAM 3.375 G IVPB
3.3750 g | Freq: Three times a day (TID) | INTRAVENOUS | Status: DC
  Administered 2023-01-23: 3.375 g via INTRAVENOUS
  Filled 2023-01-22: qty 50

## 2023-01-22 MED ORDER — MELATONIN 3 MG PO TABS: 6.0000 mg | ORAL_TABLET | Freq: Every day | ORAL | Status: DC

## 2023-01-22 MED ORDER — PHENYLEPHRINE HCL (PRESSORS) 10 MG/ML IV SOLN
INTRAVENOUS | Status: DC | PRN
  Administered 2023-01-22 (×2): 160 ug via INTRAVENOUS
  Administered 2023-01-22: 80 ug via INTRAVENOUS

## 2023-01-22 MED ORDER — PROPOFOL 10 MG/ML IV BOLUS
INTRAVENOUS | Status: AC
  Filled 2023-01-22: qty 20

## 2023-01-22 MED ORDER — HEPARIN SODIUM (PORCINE) 5000 UNIT/ML IJ SOLN: 5000.0000 [IU] | Freq: Three times a day (TID) | INTRAMUSCULAR | Status: DC

## 2023-01-22 MED ORDER — SUCCINYLCHOLINE CHLORIDE 200 MG/10ML IV SOSY
PREFILLED_SYRINGE | INTRAVENOUS | Status: DC | PRN
  Administered 2023-01-22: 120 mg via INTRAVENOUS

## 2023-01-22 MED ORDER — ACETAMINOPHEN 10 MG/ML IV SOLN
INTRAVENOUS | Status: AC
  Filled 2023-01-22: qty 100

## 2023-01-22 MED ORDER — ACETAMINOPHEN 10 MG/ML IV SOLN: 1000.0000 mg | Freq: Once | INTRAVENOUS | Status: DC | PRN

## 2023-01-22 MED ORDER — OXYCODONE HCL 5 MG/5ML PO SOLN: 5.0000 mg | Freq: Once | ORAL | Status: DC | PRN

## 2023-01-22 MED ORDER — PIPERACILLIN-TAZOBACTAM 3.375 G IVPB
3.3750 g | Freq: Three times a day (TID) | INTRAVENOUS | Status: DC
  Filled 2023-01-22: qty 50

## 2023-01-22 MED ORDER — FENTANYL CITRATE (PF) 100 MCG/2ML IJ SOLN: 25.0000 ug | INTRAMUSCULAR | Status: DC | PRN

## 2023-01-22 MED ORDER — SODIUM CHLORIDE 0.9 % IV BOLUS
1000.0000 mL | Freq: Once | INTRAVENOUS | Status: AC
  Administered 2023-01-22: 1000 mL via INTRAVENOUS

## 2023-01-22 MED ORDER — PANTOPRAZOLE SODIUM 20 MG PO TBEC
20.0000 mg | DELAYED_RELEASE_TABLET | Freq: Every day | ORAL | Status: DC
  Administered 2023-01-23: 20 mg via ORAL
  Filled 2023-01-22: qty 1

## 2023-01-22 MED ORDER — HYDROCHLOROTHIAZIDE 25 MG PO TABS
25.0000 mg | ORAL_TABLET | Freq: Every day | ORAL | Status: DC
  Administered 2023-01-23: 25 mg via ORAL
  Filled 2023-01-22: qty 1

## 2023-01-22 MED ORDER — CLONAZEPAM 0.5 MG PO TABS: 0.5000 mg | ORAL_TABLET | Freq: Every day | ORAL | Status: DC | PRN

## 2023-01-22 MED ORDER — PIPERACILLIN-TAZOBACTAM 3.375 G IVPB 30 MIN
3.3750 g | Freq: Once | INTRAVENOUS | Status: AC
  Administered 2023-01-22: 3.375 g via INTRAVENOUS
  Filled 2023-01-22: qty 50

## 2023-01-22 MED ORDER — LIDOCAINE HCL (CARDIAC) PF 100 MG/5ML IV SOSY
PREFILLED_SYRINGE | INTRAVENOUS | Status: DC | PRN
  Administered 2023-01-22: 40 mg via INTRAVENOUS

## 2023-01-22 MED ORDER — FLUOXETINE HCL 10 MG PO CAPS
30.0000 mg | ORAL_CAPSULE | Freq: Every day | ORAL | Status: DC
  Administered 2023-01-23: 30 mg via ORAL
  Filled 2023-01-22: qty 3

## 2023-01-22 MED ORDER — IOHEXOL 300 MG/ML  SOLN
80.0000 mL | Freq: Once | INTRAMUSCULAR | Status: AC | PRN
  Administered 2023-01-22: 80 mL via INTRAVENOUS

## 2023-01-22 MED ORDER — FENTANYL CITRATE (PF) 250 MCG/5ML IJ SOLN
INTRAMUSCULAR | Status: AC
  Filled 2023-01-22: qty 5

## 2023-01-22 MED ORDER — ARIPIPRAZOLE 2 MG PO TABS
1.0000 mg | ORAL_TABLET | Freq: Every day | ORAL | Status: DC
  Administered 2023-01-23: 1 mg via ORAL
  Filled 2023-01-22: qty 1

## 2023-01-22 MED ORDER — SUGAMMADEX SODIUM 200 MG/2ML IV SOLN
INTRAVENOUS | Status: DC | PRN
  Administered 2023-01-22: 200 mg via INTRAVENOUS

## 2023-01-22 MED ORDER — MIDAZOLAM HCL 5 MG/5ML IJ SOLN
INTRAMUSCULAR | Status: DC | PRN
  Administered 2023-01-22: 2 mg via INTRAVENOUS

## 2023-01-22 MED ORDER — AMISULPRIDE (ANTIEMETIC) 5 MG/2ML IV SOLN: 10.0000 mg | Freq: Once | INTRAVENOUS | Status: DC | PRN

## 2023-01-22 MED ORDER — HYDROMORPHONE HCL 1 MG/ML IJ SOLN: 0.5000 mg | INTRAMUSCULAR | Status: DC | PRN

## 2023-01-22 MED ORDER — BUPIVACAINE-EPINEPHRINE 0.25% -1:200000 IJ SOLN
INTRAMUSCULAR | Status: DC | PRN
  Administered 2023-01-22: 12 mL

## 2023-01-22 MED ORDER — ACETAMINOPHEN 10 MG/ML IV SOLN
INTRAVENOUS | Status: DC | PRN
  Administered 2023-01-22: 1000 mg via INTRAVENOUS

## 2023-01-22 SURGICAL SUPPLY — 45 items
APPLIER CLIP 5 13 M/L LIGAMAX5 (MISCELLANEOUS)
BAG COUNTER SPONGE SURGICOUNT (BAG) ×1 IMPLANT
BLADE CLIPPER SURG (BLADE) IMPLANT
CANISTER SUCT 3000ML PPV (MISCELLANEOUS) ×1 IMPLANT
CHLORAPREP W/TINT 26 (MISCELLANEOUS) ×1 IMPLANT
CLIP APPLIE 5 13 M/L LIGAMAX5 (MISCELLANEOUS) IMPLANT
COVER SURGICAL LIGHT HANDLE (MISCELLANEOUS) ×1 IMPLANT
CUTTER FLEX LINEAR 45M (STAPLE) ×1 IMPLANT
DERMABOND ADVANCED .7 DNX12 (GAUZE/BANDAGES/DRESSINGS) ×1 IMPLANT
ELECT REM PT RETURN 9FT ADLT (ELECTROSURGICAL) ×1
ELECTRODE REM PT RTRN 9FT ADLT (ELECTROSURGICAL) ×1 IMPLANT
GLOVE BIO SURGEON STRL SZ7.5 (GLOVE) ×1 IMPLANT
GLOVE INDICATOR 8.0 STRL GRN (GLOVE) ×1 IMPLANT
GOWN STRL REUS W/ TWL LRG LVL3 (GOWN DISPOSABLE) ×2 IMPLANT
GOWN STRL REUS W/ TWL XL LVL3 (GOWN DISPOSABLE) ×1 IMPLANT
GOWN STRL REUS W/TWL LRG LVL3 (GOWN DISPOSABLE) ×2
GOWN STRL REUS W/TWL XL LVL3 (GOWN DISPOSABLE) ×1
IRRIG SUCT STRYKERFLOW 2 WTIP (MISCELLANEOUS) ×1
IRRIGATION SUCT STRKRFLW 2 WTP (MISCELLANEOUS) ×1 IMPLANT
KIT BASIN OR (CUSTOM PROCEDURE TRAY) ×1 IMPLANT
KIT TURNOVER KIT B (KITS) ×1 IMPLANT
NS IRRIG 1000ML POUR BTL (IV SOLUTION) ×1 IMPLANT
PAD ARMBOARD 7.5X6 YLW CONV (MISCELLANEOUS) ×2 IMPLANT
PENCIL SMOKE EVACUATOR (MISCELLANEOUS) ×1 IMPLANT
RELOAD 45 VASCULAR/THIN (ENDOMECHANICALS) IMPLANT
RELOAD STAPLE 45 2.5 WHT GRN (ENDOMECHANICALS) IMPLANT
RELOAD STAPLE 45 3.5 BLU ETS (ENDOMECHANICALS) IMPLANT
RELOAD STAPLE TA45 3.5 REG BLU (ENDOMECHANICALS) ×1 IMPLANT
SCISSORS LAP 5X35 DISP (ENDOMECHANICALS) IMPLANT
SET TUBE SMOKE EVAC HIGH FLOW (TUBING) ×1 IMPLANT
SHEARS HARMONIC ACE PLUS 36CM (ENDOMECHANICALS) ×1 IMPLANT
SLEEVE ADV FIXATION 5X100MM (TROCAR) ×1 IMPLANT
SPECIMEN JAR SMALL (MISCELLANEOUS) ×1 IMPLANT
SUT MNCRL AB 4-0 PS2 18 (SUTURE) ×1 IMPLANT
SYS BAG RETRIEVAL 10MM (BASKET) ×1
SYSTEM BAG RETRIEVAL 10MM (BASKET) ×1 IMPLANT
TOWEL GREEN STERILE (TOWEL DISPOSABLE) ×1 IMPLANT
TOWEL GREEN STERILE FF (TOWEL DISPOSABLE) ×1 IMPLANT
TRAY FOLEY W/BAG SLVR 16FR (SET/KITS/TRAYS/PACK) ×1
TRAY FOLEY W/BAG SLVR 16FR ST (SET/KITS/TRAYS/PACK) ×1 IMPLANT
TRAY LAPAROSCOPIC MC (CUSTOM PROCEDURE TRAY) ×1 IMPLANT
TROCAR ADV FIXATION 5X100MM (TROCAR) ×1 IMPLANT
TROCAR BALLN 12MMX100 BLUNT (TROCAR) ×1 IMPLANT
WARMER LAPAROSCOPE (MISCELLANEOUS) ×1 IMPLANT
WATER STERILE IRR 1000ML POUR (IV SOLUTION) ×1 IMPLANT

## 2023-01-22 NOTE — H&P (Signed)
CC: RLQ pain, appendicitis  HPI: Tammy [name] is an 62 y.o. female with hx of HTN, HLD, GERD, Chronic pain syndrome, arthritis, presented to [facility] with RLQ pain + nausea which began 01/21/23 in early morning. Pain is sharp, constant and progressive. No appetite. No emesis, diarrhea, consitpation, blood in stool. No f/c.   Colonoscopy 01/2022 normal cecum, hemorrhoidal prolapse changes in anus  Past Medical History:  Diagnosis Date   Arthritis    Chronic fatigue syndrome    Diverticulosis    External hemorrhoids    Hypertension    Internal hemorrhoids    Neuropathy    Spinal stenosis     Past Surgical History:  Procedure Laterality Date   CARDIAC CATHETERIZATION  09/18/2012   LEFT HEART CATHETERIZATION WITH CORONARY ANGIOGRAM N/A 09/18/2012   Procedure: LEFT HEART CATHETERIZATION WITH CORONARY ANGIOGRAM;  Surgeon: Tonny Bollman, MD;  Location: [facility] CATH LAB;  Service: Cardiovascular;  Laterality: N/A;   TONSILLECTOMY  1965    Family History  Problem Relation Age of Onset   Heart attack Maternal Grandmother 50   Arthritis Maternal Grandmother    Hyperlipidemia Maternal Grandmother    Heart disease Maternal Grandmother    Diabetes Maternal Grandmother    Pancreatic cancer Mother        died 55. 2.5 years past diagnosis.     Anxiety disorder Mother    Depression Mother    Colon polyps Father    Healthy Sister    Healthy Brother    Depression Brother    Heart attack Brother        age 2   Suicidality Maternal Grandfather    Colon cancer Neg Hx    Esophageal cancer Neg Hx    Kidney disease Neg Hx     Social:  reports that she has never smoked. She has never used smokeless tobacco. She reports that she does not drink alcohol and does not use drugs.  Allergies:  Allergies  Allergen Reactions   Egg-Derived Products Other (See Comments)    Makes pt feel like burping up rotten eggs    Medications: I have reviewed the patient's current medications.  Results for  orders placed or performed during the hospital encounter of 01/22/23 (from the past 48 hour(s))  Lipase, blood     Status: Abnormal   Collection Time: 01/22/23  3:41 PM  Result Value Ref Range   Lipase <10 (L) 11 - 51 U/L    Comment: Performed at [facility], [address], [place], [place] [address]  Comprehensive metabolic panel     Status: Abnormal   Collection Time: 01/22/23  3:41 PM  Result Value Ref Range   Sodium 134 (L) 135 - 145 mmol/L   Potassium 3.3 (L) 3.5 - 5.1 mmol/L   Chloride 96 (L) 98 - 111 mmol/L   CO2 26 22 - 32 mmol/L   Glucose, Bld 113 (H) 70 - 99 mg/dL    Comment: Glucose reference range applies only to samples taken after fasting for at least 8 hours.   BUN 16 8 - 23 mg/dL   Creatinine, Ser 5.78 0.44 - 1.00 mg/dL   Calcium 9.6 8.9 - 46.9 mg/dL   Total Protein 7.7 6.5 - 8.1 g/dL   Albumin 4.6 3.5 - 5.0 g/dL   AST 15 15 - 41 U/L   ALT 16 0 - 44 U/L   Alkaline Phosphatase 71 38 - 126 U/L   Total Bilirubin 0.8 0.3 - 1.2 mg/dL  GFR, Estimated >60 >60 mL/min    Comment: (NOTE) Calculated using the CKD-EPI Creatinine Equation (2021)    Anion gap 12 5 - 15    Comment: Performed at [facility], [address] [place], [place] [address]  CBC     Status: Abnormal   Collection Time: 01/22/23  3:41 PM  Result Value Ref Range   WBC 12.6 (H) 4.0 - 10.5 K/uL   RBC 4.98 3.87 - 5.11 MIL/uL   Hemoglobin 13.5 12.0 - 15.0 g/dL   HCT 60.4 54.0 - 98.1 %   MCV 81.7 80.0 - 100.0 fL   MCH 27.1 26.0 - 34.0 pg   MCHC 33.2 30.0 - 36.0 g/dL   RDW 19.1 47.8 - 29.5 %   Platelets 264 150 - 400 K/uL   nRBC 0.0 0.0 - 0.2 %    Comment: Performed at [facility], [address], [place], [place] [address]  Urinalysis, Routine w reflex microscopic -Urine, Clean Catch     Status: Abnormal   Collection Time: 01/22/23  3:41 PM  Result Value Ref Range   Color, Urine YELLOW YELLOW   APPearance CLEAR CLEAR   Specific  Gravity, Urine 1.034 (H) 1.005 - 1.030   pH 6.5 5.0 - 8.0   Glucose, UA NEGATIVE NEGATIVE mg/dL   Hgb urine dipstick TRACE (A) NEGATIVE   Bilirubin Urine NEGATIVE NEGATIVE   Ketones, ur TRACE (A) NEGATIVE mg/dL   Protein, ur 30 (A) NEGATIVE mg/dL   Nitrite NEGATIVE NEGATIVE   Leukocytes,Ua SMALL (A) NEGATIVE   RBC / HPF 6-10 0 - 5 RBC/hpf   WBC, UA 6-10 0 - 5 WBC/hpf   Bacteria, UA RARE (A) NONE SEEN   Squamous Epithelial / HPF 0-5 0 - 5 /HPF   Mucus PRESENT    Hyaline Casts, UA PRESENT     Comment: Performed at [facility], [address], [place], [place] [address]    CT ABDOMEN PELVIS W CONTRAST  Result Date: 01/22/2023 CLINICAL DATA:  Right lower quadrant pain for 2 days.  Nausea. EXAM: CT ABDOMEN AND PELVIS WITH CONTRAST TECHNIQUE: Multidetector CT imaging of the abdomen and pelvis was performed using the standard protocol following bolus administration of intravenous contrast. RADIATION DOSE REDUCTION: This exam was performed according to the departmental dose-optimization program which includes automated exposure control, adjustment of the mA and/or kV according to patient size and/or use of iterative reconstruction technique. CONTRAST:  80mL OMNIPAQUE IOHEXOL 300 MG/ML  SOLN COMPARISON:  None Available. FINDINGS: Lower Chest: No acute findings. Hepatobiliary: No hepatic masses identified. Gallbladder is unremarkable. No evidence of biliary ductal dilatation. Pancreas:  No mass or inflammatory changes. Spleen: Within normal limits in size and appearance. Adrenals/Urinary Tract: No suspicious masses identified. No evidence of ureteral calculi or hydronephrosis. Stomach/Bowel: Moderate periappendiceal inflammatory changes are seen with findings of acute appendicitis as follows: Appendix: Location- Standard Diameter-13 mm Appendicolith- Present Mucosal hyper-enhancement- Present Extraluminal Gas- Absent Periappendiceal Collection- None Vascular/Lymphatic: No pathologically  enlarged lymph nodes. No acute vascular findings. Reproductive: Small fibroid in the uterine fundus measuring 2.9 cm. Adnexal regions are unremarkable. Other:  None. Musculoskeletal:  No suspicious bone lesions identified. IMPRESSION: Positive for acute appendicitis. No evidence of abscess or other complication. Small uterine fibroid. Electronically Signed   By: Danae Orleans M.D.   On: 01/22/2023 17:20    ROS - all of the below systems have been reviewed with the patient and positives are indicated with bold text General: chills, fever or night  sweats Eyes: blurry vision or double vision ENT: epistaxis or sore throat Allergy/Immunology: itchy/watery eyes or nasal congestion Hematologic/Lymphatic: bleeding problems, blood clots or swollen lymph nodes Endocrine: temperature intolerance or unexpected weight changes Breast: new or changing breast lumps or nipple discharge Resp: cough, shortness of breath, or wheezing CV: chest pain or dyspnea on exertion GI: as per HPI GU: dysuria, trouble voiding, or hematuria MSK: joint pain or joint stiffness Neuro: TIA or stroke symptoms Derm: pruritus and skin lesion changes Psych: anxiety and depression  PE Blood pressure 106/70, pulse (!) 102, temperature 99.9 F (37.7 C), temperature source Oral, resp. rate (!) 24, last menstrual period 11/05/2007, SpO2 99 %. Constitutional: NAD; conversant Eyes: Moist conjunctiva Lungs: Normal respiratory effort CV: RRR; no pitting edema GI: Abd soft focally ttp in lower abdomen and RLQ; no rebound/guarding, nondistended; no scars MSK: Normal range of motion of extremities Psychiatric: Appropriate affect  Results for orders placed or performed during the hospital encounter of 01/22/23 (from the past 48 hour(s))  Lipase, blood     Status: Abnormal   Collection Time: 01/22/23  3:41 PM  Result Value Ref Range   Lipase <10 (L) 11 - 51 U/L    Comment: Performed at [facility], [address], [place], [place] [address]  Comprehensive metabolic panel     Status: Abnormal   Collection Time: 01/22/23  3:41 PM  Result Value Ref Range   Sodium 134 (L) 135 - 145 mmol/L   Potassium 3.3 (L) 3.5 - 5.1 mmol/L   Chloride 96 (L) 98 - 111 mmol/L   CO2 26 22 - 32 mmol/L   Glucose, Bld 113 (H) 70 - 99 mg/dL    Comment: Glucose reference range applies only to samples taken after fasting for at least 8 hours.   BUN 16 8 - 23 mg/dL   Creatinine, Ser 6.04 0.44 - 1.00 mg/dL   Calcium 9.6 8.9 - 54.0 mg/dL   Total Protein 7.7 6.5 - 8.1 g/dL   Albumin 4.6 3.5 - 5.0 g/dL   AST 15 15 - 41 U/L   ALT 16 0 - 44 U/L   Alkaline Phosphatase 71 38 - 126 U/L   Total Bilirubin 0.8 0.3 - 1.2 mg/dL   GFR, Estimated >98 >11 mL/min    Comment: (NOTE) Calculated using the CKD-EPI Creatinine Equation (2021)    Anion gap 12 5 - 15    Comment: Performed at [facility], [address], [place], [place] [address]  CBC     Status: Abnormal   Collection Time: 01/22/23  3:41 PM  Result Value Ref Range   WBC 12.6 (H) 4.0 - 10.5 K/uL   RBC 4.98 3.87 - 5.11 MIL/uL   Hemoglobin 13.5 12.0 - 15.0 g/dL   HCT 29.5 62.1 - 30.8 %   MCV 81.7 80.0 - 100.0 fL   MCH 27.1 26.0 - 34.0 pg   MCHC 33.2 30.0 - 36.0 g/dL   RDW 65.7 84.6 - 96.2 %   Platelets 264 150 - 400 K/uL   nRBC 0.0 0.0 - 0.2 %    Comment: Performed at [facility], [address], [place], [place] [address]  Urinalysis, Routine w reflex microscopic -Urine, Clean Catch     Status: Abnormal   Collection Time: 01/22/23  3:41 PM  Result Value Ref Range   Color, Urine YELLOW YELLOW   APPearance CLEAR CLEAR   Specific Gravity, Urine 1.034 (H) 1.005 - 1.030   pH 6.5 5.0 -  8.0   Glucose, UA NEGATIVE NEGATIVE mg/dL   Hgb urine dipstick TRACE (A) NEGATIVE   Bilirubin Urine NEGATIVE NEGATIVE   Ketones, ur TRACE (A) NEGATIVE mg/dL   Protein, ur 30 (A) NEGATIVE mg/dL   Nitrite NEGATIVE NEGATIVE   Leukocytes,Ua SMALL  (A) NEGATIVE   RBC / HPF 6-10 0 - 5 RBC/hpf   WBC, UA 6-10 0 - 5 WBC/hpf   Bacteria, UA RARE (A) NONE SEEN   Squamous Epithelial / HPF 0-5 0 - 5 /HPF   Mucus PRESENT    Hyaline Casts, UA PRESENT     Comment: Performed at [facility], [address] [place], [place] [address]    CT ABDOMEN PELVIS W CONTRAST  Result Date: 01/22/2023 CLINICAL DATA:  Right lower quadrant pain for 2 days.  Nausea. EXAM: CT ABDOMEN AND PELVIS WITH CONTRAST TECHNIQUE: Multidetector CT imaging of the abdomen and pelvis was performed using the standard protocol following bolus administration of intravenous contrast. RADIATION DOSE REDUCTION: This exam was performed according to the departmental dose-optimization program which includes automated exposure control, adjustment of the mA and/or kV according to patient size and/or use of iterative reconstruction technique. CONTRAST:  80mL OMNIPAQUE IOHEXOL 300 MG/ML  SOLN COMPARISON:  None Available. FINDINGS: Lower Chest: No acute findings. Hepatobiliary: No hepatic masses identified. Gallbladder is unremarkable. No evidence of biliary ductal dilatation. Pancreas:  No mass or inflammatory changes. Spleen: Within normal limits in size and appearance. Adrenals/Urinary Tract: No suspicious masses identified. No evidence of ureteral calculi or hydronephrosis. Stomach/Bowel: Moderate periappendiceal inflammatory changes are seen with findings of acute appendicitis as follows: Appendix: Location- Standard Diameter-13 mm Appendicolith- Present Mucosal hyper-enhancement- Present Extraluminal Gas- Absent Periappendiceal Collection- None Vascular/Lymphatic: No pathologically enlarged lymph nodes. No acute vascular findings. Reproductive: Small fibroid in the uterine fundus measuring 2.9 cm. Adnexal regions are unremarkable. Other:  None. Musculoskeletal:  No suspicious bone lesions identified. IMPRESSION: Positive for acute appendicitis. No evidence of abscess or other  complication. Small uterine fibroid. Electronically Signed   By: Danae Orleans M.D.   On: 01/22/2023 17:20    A/P: Tammy [name] is an 62 y.o. female with acute appendicitis with fecaliths x2  -The anatomy and physiology of the GI tract was discussed with the patient. The pathophysiology of appendicitis was discussed as well. -We reviewed options moving forward for treatment, covering IV abx vs surgery. We discussed that with antibiotics alone in the absence of fecaliths, there is reasonable success in managing appendicitis, however, risks of recurrence at 19yrs being as high as 40% in some studies. We discussed much higher rate of nonoperative management in presence of fecaliths. We discussed appendectomy - laparoscopic and potential open techniques as well as scenarios where an ileocecectomy could be necessary. We discussed the material risks (including, but not limited to, pain, bleeding, infection, scarring, need for blood transfusion, damage to surrounding structures- blood vessels/nerves/viscus/organs, damage to ureter/bladder, leak from staple line and in severe cases situations where an ostomy could be necessary, need for additional procedures, hernia, recurrence although quite low, pneumonia, heart attack, stroke, death) benefits and alternatives to surgery were discussed. The patient's questions were answered to her satisfaction, she voiced understanding and elected to proceed with surgery. Additionally, we discussed typical postoperative expectations and the recovery process.  I spent a total of 50 minutes in both face-to-face and non-face-to-face activities, excluding procedures performed, for this visit on the date of this encounter.  [name], MD [facility]

## 2023-01-22 NOTE — ED Triage Notes (Signed)
Severe abd pain since yesterday am. Lower right quadrant tender/pain , nauseated, no vomiting, no fevers, no diarrhea.

## 2023-01-22 NOTE — Progress Notes (Signed)
Pharmacy Antibiotic Note  Tammy [name] is a 62 y.o. female admitted on 01/22/2023 with  intra-abdominal infection .  Pharmacy has been consulted for Zosyn dosing. SCr 0.87 on presentation. Zosyn 3.375g ( infusion) ordered x 1 dose in the OR.  Plan: Continue Zosyn 3.375g IV q8h (4h infusion) Monitor clinical progress, c/s, renal function F/u de-escalation plan/LOT     Temp (24hrs), Avg:99.6 F (37.6 C), Min:99.2 F (37.3 C), Max:99.9 F (37.7 C)  Recent Labs  Lab 01/22/23 1541  WBC 12.6*  CREATININE 0.87    CrCl cannot be calculated (Unknown ideal weight.).    Allergies  Allergen Reactions   Egg-Derived Products Other (See Comments)    Makes pt feel like burping up rotten eggs    Leia Alf, PharmD, BCPS Please check AMION for all [facility] contact numbers Clinical Pharmacist 01/22/2023 8:43 PM

## 2023-01-22 NOTE — ED Notes (Signed)
[facility] at bedside 

## 2023-01-22 NOTE — Anesthesia Postprocedure Evaluation (Signed)
Anesthesia Post Note  Patient: Tammy [name]  Procedure(s) Performed: APPENDECTOMY LAPAROSCOPIC     Patient location during evaluation: PACU Anesthesia Type: General Level of consciousness: awake and alert Pain management: pain level controlled Vital Signs Assessment: post-procedure vital signs reviewed and stable Respiratory status: spontaneous breathing, nonlabored ventilation, respiratory function stable and patient connected to nasal cannula oxygen Cardiovascular status: blood pressure returned to baseline and stable Postop Assessment: no apparent nausea or vomiting Anesthetic complications: no  No notable events documented.  Last Vitals:  Vitals:   01/22/23 2200 01/22/23 2222  BP: 111/69 98/65  Pulse: 97 93  Resp: 12 16  Temp: 37.5 C 37.1 C  SpO2: 93% 95%    Last Pain:  Vitals:   01/22/23 2222  TempSrc: Oral  PainSc:                  Shelton Silvas

## 2023-01-22 NOTE — Transfer of Care (Signed)
Immediate Anesthesia Transfer of Care Note  Patient: Tammy [name]  Procedure(s) Performed: APPENDECTOMY LAPAROSCOPIC  Patient Location: PACU  Anesthesia Type:General  Level of Consciousness: awake, alert , and oriented  Airway & Oxygen Therapy: Patient Spontanous Breathing and Patient connected to nasal cannula oxygen  Post-op Assessment: Report given to RN, Post -op Vital signs reviewed and stable, and Patient moving all extremities  Post vital signs: Reviewed and stable  Last Vitals:  Vitals Value Taken Time  BP 97/60 01/22/23 2130  Temp    Pulse 98 01/22/23 2135  Resp 16 01/22/23 2135  SpO2 96 % 01/22/23 2135  Vitals shown include unvalidated device data.  Last Pain:  Vitals:   01/22/23 1813  TempSrc: Oral  PainSc:          Complications: No notable events documented.

## 2023-01-22 NOTE — Anesthesia Preprocedure Evaluation (Addendum)
Anesthesia Evaluation  Patient identified by MRN, date of birth, ID band Patient awake    Reviewed: Allergy & Precautions, NPO status , Patient's Chart, lab work & pertinent test results  Airway Mallampati: III  TM Distance: >3 FB Neck ROM: Full    Dental  (+) Teeth Intact, Dental Advisory Given   Pulmonary neg pulmonary ROS   breath sounds clear to auscultation       Cardiovascular hypertension, Normal cardiovascular exam     Neuro/Psych  PSYCHIATRIC DISORDERS Anxiety Depression    negative neurological ROS     GI/Hepatic Neg liver ROS,GERD  Medicated,,  Endo/Other  diabetes, Type 2    Renal/GU negative Renal ROS     Musculoskeletal  (+) Arthritis ,    Abdominal   Peds  Hematology negative hematology ROS (+)   Anesthesia Other Findings   Reproductive/Obstetrics                             Anesthesia Physical Anesthesia Plan  ASA: 3  Anesthesia Plan: General   Post-op Pain Management: Ofirmev IV (intra-op)* and Toradol IV (intra-op)*   Induction: Intravenous  PONV Risk Score and Plan: 4 or greater and Ondansetron, Dexamethasone, Midazolam and Scopolamine patch - Pre-op  Airway Management Planned: Oral ETT  Additional Equipment: None  Intra-op Plan:   Post-operative Plan: Extubation in OR  Informed Consent: I have reviewed the patients History and Physical, chart, labs and discussed the procedure including the risks, benefits and alternatives for the proposed anesthesia with the patient or authorized representative who has indicated his/her understanding and acceptance.     Dental advisory given  Plan Discussed with: CRNA  Anesthesia Plan Comments:        Anesthesia Quick Evaluation

## 2023-01-22 NOTE — Op Note (Signed)
Tammy [name] [id number]   PRE-OPERATIVE DIAGNOSIS:  Acute appendicitis  POST-OPERATIVE DIAGNOSIS:  Acute appendicitis with gangrene and perforation  PROCEDURE: Laparoscopic appendectomy  SURGEON:  [name], M.D.  ANESTHESIA: General endotracheal  EBL:   5 mL  DRAINS: None  SPECIMEN:  Appendix  COUNTS:  Sponge, needle and instrument counts were reported correct x2 at conclusion of the operation  DISPOSITION:  PACU in satisfactory condition  COMPLICATIONS: None  FINDINGS: Acutely inflamed appendix with gangrenous perforation of the midportion.  There was some active spillage of watery stool/diarrhea on elevation of the appendix.  Appendectomy is able to be carried out with a healthy appearing appendiceal base, cecum, and terminal ileum.  DESCRIPTION:   The patient was identified & she was brought into the operating room. SCDs were in place and functioning. General endotracheal anesthesia was administered. Preoperative antibiotics were administered. The patient was positioned supine with left arm tucked. Hair on the abdomen was then clipped by the OR team. A foley catheter was inserted under sterile conditions. The abdomen was prepped and draped in the standard sterile fashion. A surgical timeout confirmed our plan.  A small incision was made in the infraumbilical fold. The subcutaneous tissue was dissected and the umbilical stalk identified. The stalk was grasped with a Kocher and retracted outwardly. The infraumbilical fascia was exposed and incised. Peritoneal entry was carefully made bluntly. A 0 Vicryl purse-string suture was placed and then the [facility] port was introduced into the abdomen.  CO2 insufflation commenced to . The laparoscope was inserted and confirmed no evidence of trocar site complications. The patient was then positioned in Trendelenburg. Two additional ports were placed - one in left lower quadrant and another in the suprapubic midline taking care to  stay well above the bladder - 3 fingerbreadths above the pubic symphysis. The bed was then slightly tilted to place the left side down.  The appendix is identified in the right lower quadrant after sweeping omentum into the upper abdomen.  There is gangrenous type changes of the midportion of the appendix with an evident perforation at this location.  With minimal manipulation, there was evidence spillage of pus and watery stool.   Upon entering the abdomen (organ space), I encountered a phlegmon involving the appendix with spillage of stool and pus .  CASE DATA:  Type of patient?: DOW CASE (Surgical Hospitalist [facility])  Status of Case? URGENT Add On  Infection Present At Time Of Surgery (PATOS)?  ABSCESS in the appendix      The appendix is carefully freed from surrounding structures bluntly without difficulty. Care was taken to avoid injuring any retroperitoneal structures. The appendix was elevated.  The base of the appendix was circumferentially dissected taking care to preserve the cecum free of injury. The base was noted to be viable and healthy appearing. The terminal ileum, cecum and ascending colon also appeared normal. The mesoappendix is then divided using the harmonic scalpel, maintaining a plane close to the appendix and terminating at our previously created window. The cut edge of the mesoappendix is observed and hemostatic. The base of the appendix was then stapled with a blue load, taking it flush with the cecum, but also steering clear of the ileocecal valve. The appendix was placed in an EndoBag and removed from the umbilical port site and passed off as specimen.  The right lower quadrant was conservatively irrigated. Hemostasis was noted to be achieved - taking time to inspect the ligated mesoappendix. Staple line was noted to  be intact on the cecum with well formed staples and no bleeding. The right lower quadrant appeared clean and as such, no drain was placed.  The  left lower quadrant and suprapubic ports were removed under direct visualization. The CO2 was exhausted from the abdomen. The umbilical fascia was then closed by tieing the 0 Vicryl suture, obliterating the fascial defect. The fascia was then palpated and noted to be completely closed. The skin of all port sites was approximated using 4-0 Monocryl suture. The abdomen is then washed and dried. The incisions are covered with Dermabond.  She was then awakened from anesthesia, extubated, and transferred to a stretcher for transport to PACU in satisfactory condition.

## 2023-01-22 NOTE — ED Notes (Signed)
Tammy [name] at [facility] will send transport. PT accepted by Dr Feliciana Rossetti at [facility]. Going to [facility] Pre Op for Surgery.-ABB(NS)

## 2023-01-22 NOTE — ED Notes (Signed)
Patient transported to CT 

## 2023-01-22 NOTE — ED Provider Notes (Signed)
[facility] EMERGENCY DEPARTMENT AT [facility] Provider Note   CSN: [id number] Arrival date & time: 01/22/23  1439     History  Chief Complaint  Patient presents with   Abdominal Pain    Tammy [name] is a 62 y.o. female with past medical history significant for hypertension, diverticulosis, arthritis, hyperlipidemia, GERD, chronic pain syndrome presents to the ED complaining of right lower quadrant pain and nausea that began yesterday around 0500.  She states the pain became increasingly worse today and she has not been able to eat.  She has tolerated sips of water, but nothing more.  Denies vomiting, diarrhea, abdominal distention, fever, chills, dysuria, flank pain, pelvic pain.  She has not had any prior abdominal surgeries.  She last ate at 1800 yesterday.         Home Medications Prior to Admission medications   Medication Sig Start Date End Date Taking? Authorizing Provider  ACETAMINOPHEN-CAFFEINE PO Take by mouth in the morning.    [provider]  ARIPiprazole (ABILIFY) 2 MG tablet Take 0.5-1 tablets (1-2 mg total) by mouth daily. 12/16/22   Corie Chiquito, PMHNP  clonazePAM (KLONOPIN) 0.5 MG tablet Take 1 tablet (0.5 mg total) by mouth daily as needed. 12/28/21 06/26/22  Corie Chiquito, PMHNP  COVID-19 mRNA vaccine [phone] (COMIRNATY) syringe Inject into the muscle. 07/06/22   Judyann Munson, MD  FLUoxetine (PROZAC) 10 MG capsule Take 3 capsules (30 mg total) by mouth daily. 12/16/22   Corie Chiquito, PMHNP  hydrochlorothiazide (HYDRODIURIL) 25 MG tablet Take 1 tablet (25 mg total) by mouth daily. 01/29/22   Shelva Majestic, MD  lisdexamfetamine (VYVANSE) 20 MG capsule Take 1 capsule by mouth daily. 05/06/22   Corie Chiquito, PMHNP  lisdexamfetamine (VYVANSE) 20 MG capsule Take 1 capsule (20 mg total) by mouth in the morning. 10/07/22     lisdexamfetamine (VYVANSE) 20 MG capsule Take 1 capsule (20 mg total) by mouth daily. 02/17/23   Corie Chiquito, PMHNP   MAGNESIUM CHLORIDE-CALCIUM PO Take by mouth.    [provider]  Melatonin 3 MG CAPS Take 6 mg by mouth at bedtime.    [provider]  montelukast (SINGULAIR) 10 MG tablet Take 1 tablet (10 mg total) by mouth daily. Patient not taking: Reported on 12/16/2022 12/09/22     mupirocin ointment (BACTROBAN) 2 % Apply 1 application topically to upper back twice daily 15 days 12/09/22     ondansetron (ZOFRAN-ODT) 4 MG disintegrating tablet Dissolve 1 tablet on the tongue every 8 hours as needed 04/27/22     pantoprazole (PROTONIX) 20 MG tablet Take 1 tablet by mouth daily. 02/19/22   Shelva Majestic, MD  Probiotic Product (PROBIOTIC DAILY PO) Take by mouth.    [provider]  Semaglutide-Weight Management (WEGOVY) 0.25 MG/0.5ML SOAJ Inject 0.25 mg 04/27/22     Semaglutide-Weight Management (WEGOVY) 0.5 MG/0.5ML SOAJ Inject 0.5 mg subcutaneously as directed 04/27/22     Semaglutide-Weight Management (WEGOVY) 1 MG/0.5ML SOAJ Inject 1 mg into the skin once a week. 07/21/22     Semaglutide-Weight Management (WEGOVY) 1.7 MG/0.75ML SOAJ Inject 1 pen (1.7 MG) into the skin once a week 04/27/22     tacrolimus (PROTOPIC) 0.1 % ointment Apply as directed to affected area twice a day 01/07/23     tirzepatide (ZEPBOUND) 15 MG/0.5ML Pen Inject 15 mg into the skin every 7 (seven) days. 11/18/22     tirzepatide (ZEPBOUND) 15 MG/0.5ML Pen Inject 15 mg into the skin once a week. 12/09/22  tirzepatide (ZEPBOUND) 15 MG/0.5ML Pen Inject 15 mg into the skin once a week. 12/30/22     tirzepatide (ZEPBOUND) 15 MG/0.5ML Pen Inject 15 mg into the skin once a week. 01/12/23     tirzepatide (ZEPBOUND) 2.5 MG/0.5ML Pen Inject 2.5 mg into the skin once a week. 09/01/22     tirzepatide (ZEPBOUND) 5 MG/0.5ML Pen Inject 5 mg into the skin every 7 (seven) days. 10/06/22     tirzepatide (ZEPBOUND) 7.5 MG/0.5ML Pen Inject 7.5 mg into the skin once a week. 10/21/22         Allergies    Egg-derived products    Review of  Systems   Review of Systems  Constitutional:  Positive for appetite change (decreased). Negative for chills and fever.  Gastrointestinal:  Positive for abdominal pain and nausea. Negative for abdominal distention, diarrhea and vomiting.  Genitourinary:  Negative for dysuria, flank pain and pelvic pain.    Physical Exam Updated Vital Signs BP 123/78   Pulse 98   Temp 99.2 F (37.3 C) (Oral)   Resp 17   LMP 11/05/2007   SpO2 100%  Physical Exam Vitals and nursing note reviewed.  Constitutional:      General: She is not in acute distress.    Appearance: She is not ill-appearing.  HENT:     Mouth/Throat:     Mouth: Mucous membranes are moist.     Pharynx: Oropharynx is clear.  Cardiovascular:     Rate and Rhythm: Regular rhythm. Tachycardia present.     Pulses: Normal pulses.     Heart sounds: Normal heart sounds.  Pulmonary:     Effort: Pulmonary effort is normal. No respiratory distress.     Breath sounds: Normal breath sounds and air entry.  Abdominal:     General: Abdomen is flat. Bowel sounds are normal. There is no distension.     Palpations: Abdomen is soft.     Tenderness: There is abdominal tenderness in the right lower quadrant. There is no guarding or rebound. Positive signs include McBurney's sign.  Skin:    General: Skin is warm and dry.     Capillary Refill: Capillary refill takes [name] than 2 seconds.  Neurological:     Mental Status: She is alert. Mental status is at baseline.  Psychiatric:        Mood and Affect: Mood normal.        Behavior: Behavior normal.     ED Results / Procedures / Treatments   Labs (all labs ordered are listed, but only abnormal results are displayed) Labs Reviewed  LIPASE, BLOOD - Abnormal; Notable for the following components:      Result Value   Lipase <10 (*)    All other components within normal limits  COMPREHENSIVE METABOLIC PANEL - Abnormal; Notable for the following components:   Sodium 134 (*)    Potassium 3.3 (*)     Chloride 96 (*)    Glucose, Bld 113 (*)    All other components within normal limits  CBC - Abnormal; Notable for the following components:   WBC 12.6 (*)    All other components within normal limits  URINALYSIS, ROUTINE W REFLEX MICROSCOPIC - Abnormal; Notable for the following components:   Specific Gravity, Urine 1.034 (*)    Hgb urine dipstick TRACE (*)    Ketones, ur TRACE (*)    Protein, ur 30 (*)    Leukocytes,Ua SMALL (*)    Bacteria, UA RARE (*)    All  other components within normal limits    EKG None  Radiology CT ABDOMEN PELVIS W CONTRAST  Result Date: 01/22/2023 CLINICAL DATA:  Right lower quadrant pain for 2 days.  Nausea. EXAM: CT ABDOMEN AND PELVIS WITH CONTRAST TECHNIQUE: Multidetector CT imaging of the abdomen and pelvis was performed using the standard protocol following bolus administration of intravenous contrast. RADIATION DOSE REDUCTION: This exam was performed according to the departmental dose-optimization program which includes automated exposure control, adjustment of the mA and/or kV according to patient size and/or use of iterative reconstruction technique. CONTRAST:  80mL OMNIPAQUE IOHEXOL 300 MG/ML  SOLN COMPARISON:  None Available. FINDINGS: Lower Chest: No acute findings. Hepatobiliary: No hepatic masses identified. Gallbladder is unremarkable. No evidence of biliary ductal dilatation. Pancreas:  No mass or inflammatory changes. Spleen: Within normal limits in size and appearance. Adrenals/Urinary Tract: No suspicious masses identified. No evidence of ureteral calculi or hydronephrosis. Stomach/Bowel: Moderate periappendiceal inflammatory changes are seen with findings of acute appendicitis as follows: Appendix: Location- Standard Diameter-13 mm Appendicolith- Present Mucosal hyper-enhancement- Present Extraluminal Gas- Absent Periappendiceal Collection- None Vascular/Lymphatic: No pathologically enlarged lymph nodes. No acute vascular findings. Reproductive:  Small fibroid in the uterine fundus measuring 2.9 cm. Adnexal regions are unremarkable. Other:  None. Musculoskeletal:  No suspicious bone lesions identified. IMPRESSION: Positive for acute appendicitis. No evidence of abscess or other complication. Small uterine fibroid. Electronically Signed   By: Danae Orleans M.D.   On: 01/22/2023 17:20    Procedures Procedures    Medications Ordered in ED Medications  sodium chloride 0.9 % bolus 1,000 mL (1,000 mLs Intravenous New Bag/Given 01/22/23 1623)  ondansetron (ZOFRAN) injection 4 mg (4 mg Intravenous Given 01/22/23 1622)  morphine (PF) 4 MG/ML injection 4 mg (4 mg Intravenous Given 01/22/23 1641)  iohexol (OMNIPAQUE) 300 MG/ML solution 80 mL (80 mLs Intravenous Contrast Given 01/22/23 1655)    ED Course/ Medical Decision Making/ A&P                             Medical Decision Making Amount and/or Complexity of Data Reviewed Radiology: ordered.  Risk Prescription drug management.   This patient presents to the ED with chief complaint(s) of RLQ abdominal pain with nausea with pertinent past medical history of hypertension, hyperlipidemia, diverticulosis.  The complaint involves an extensive differential diagnosis and also carries with it a high risk of complications and morbidity.    The differential diagnosis includes acute appendicitis, diverticulitis, gastroenteritis, colitis, ovarian cyst    The initial plan is to obtain abdominal pain work-up and CT imaging  Initial Assessment:   Exam significant for tenderness to palpation of RLQ without rebound tenderness.  There is tenderness at McBurney's point.  Abdomen is soft, non tender in all other quadrants.  No distension, no appreciable hernias.  Skin is warm and dry, no jaundice.  Heart rate is tachycardic around 106 with regular rhythm.  Patient does not appear to be in acute distress and is not ill-appearing.   Independent ECG/labs interpretation:  The following labs were independently  interpreted:  CBC with leukocytosis, no anemia.  Metabolic panel with mild hyponatremia and hypokalemia, no other major electrolyte disturbances.  Lipase not indicative of pancreatitis.  UA without infection, evidence of mild dehydration.    Independent visualization and interpretation of imaging: I independently visualized the following imaging with scope of interpretation limited to determining acute life threatening conditions related to emergency care: CT abdomen pelvis, which revealed acute  appendicitis and fecolith.  I agree with radiologist interpretation.    Treatment and Reassessment: Patient was given IV fluids, Zofran, and morphine with improvement in her symptoms.  She has remained NPO.    Consultations obtained:   Attending, Glyn Ade, spoke with on-call surgeon Dr. Sheliah Hatch who will accept patient over at [facility].    Disposition:   Patient to be transferred by [facility] to [facility] preop for surgical management of acute appendicitis.           Final Clinical Impression(s) / ED Diagnoses Final diagnoses:  Acute appendicitis with localized peritonitis, without perforation, abscess, or gangrene    Rx / DC Orders ED Discharge Orders     None         Lenard Simmer, PA-C 01/22/23 1802    Glyn Ade, MD 01/22/23 1818

## 2023-01-22 NOTE — Anesthesia Procedure Notes (Signed)
Procedure Name: Intubation Date/Time: 01/22/2023 8:07 PM  Performed by: Syan Cullimore T, CRNAPre-anesthesia Checklist: Patient identified, Emergency Drugs available, Suction available and Patient being monitored Patient Re-evaluated:Patient Re-evaluated prior to induction Oxygen Delivery Method: Circle system utilized Preoxygenation: Pre-oxygenation with 100% oxygen Induction Type: IV induction Ventilation: Mask ventilation without difficulty Laryngoscope Size: Miller and 2 Grade View: Grade II Tube type: Oral Tube size: 7.0 mm Number of attempts: 1 Airway Equipment and Method: Stylet and Oral airway Placement Confirmation: ETT inserted through vocal cords under direct vision, positive ETCO2 and breath sounds checked- equal and bilateral Secured at: 21 cm Tube secured with: Tape Dental Injury: Teeth and Oropharynx as per pre-operative assessment

## 2023-01-23 ENCOUNTER — Encounter (HOSPITAL_COMMUNITY): Payer: Self-pay | Admitting: Surgery

## 2023-01-23 ENCOUNTER — Other Ambulatory Visit: Payer: Self-pay

## 2023-01-23 LAB — CBC
HCT: 33.6 % — ABNORMAL LOW (ref 36.0–46.0)
Hemoglobin: 11.2 g/dL — ABNORMAL LOW (ref 12.0–15.0)
MCH: 26.9 pg (ref 26.0–34.0)
MCHC: 33.3 g/dL (ref 30.0–36.0)
MCV: 80.8 fL (ref 80.0–100.0)
Platelets: 196 10*3/uL (ref 150–400)
RBC: 4.16 MIL/uL (ref 3.87–5.11)
RDW: 12.7 % (ref 11.5–15.5)
WBC: 11.1 10*3/uL — ABNORMAL HIGH (ref 4.0–10.5)
nRBC: 0 % (ref 0.0–0.2)

## 2023-01-23 LAB — BASIC METABOLIC PANEL
Anion gap: 9 (ref 5–15)
BUN: 10 mg/dL (ref 8–23)
CO2: 24 mmol/L (ref 22–32)
Calcium: 8.5 mg/dL — ABNORMAL LOW (ref 8.9–10.3)
Chloride: 101 mmol/L (ref 98–111)
Creatinine, Ser: 0.79 mg/dL (ref 0.44–1.00)
GFR, Estimated: >60 mL/min (ref >60)
Glucose, Bld: 138 mg/dL — ABNORMAL HIGH (ref 70–99)
Potassium: 3 mmol/L — ABNORMAL LOW (ref 3.5–5.1)
Sodium: 134 mmol/L — ABNORMAL LOW (ref 135–145)

## 2023-01-23 MED ORDER — AMOXICILLIN-POT CLAVULANATE 875-125 MG PO TABS: 1.0000 | ORAL_TABLET | Freq: Two times a day (BID) | ORAL | 0 refills | Status: AC

## 2023-01-23 MED ORDER — TRAMADOL HCL 50 MG PO TABS: 50.0000 mg | ORAL_TABLET | Freq: Four times a day (QID) | ORAL | 0 refills | Status: DC | PRN

## 2023-01-23 NOTE — Discharge Instructions (Signed)
LAPAROSCOPIC SURGERY: POST OP INSTRUCTIONS  DIET: Follow a light bland diet the first 24 hours after arrival home, such as soup, liquids, crackers, etc.  Be sure to include lots of fluids daily.  Avoid fast food or heavy meals as your are more likely to get nauseated.  Eat a low fat the next few days after surgery.   Take your usually prescribed home medications unless otherwise directed. PAIN CONTROL: Pain is best controlled by a usual combination of three different methods TOGETHER: Ice/Heat Over the counter pain medication Prescription pain medication Most patients will experience some swelling and bruising around the incisions.  Ice packs or heating pads (30-60 minutes up to 6 times a day) will help. Use ice for the first few days to help decrease swelling and bruising, then switch to heat to help relax tight/sore spots and speed recovery.  Some people prefer to use ice alone, heat alone, alternating between ice & heat.  Experiment to what works for you.  Swelling and bruising can take several weeks to resolve.   It is helpful to take an over-the-counter pain medication regularly for the first few weeks.  Choose one of the following that works best for you: Naproxen (Aleve, etc)  Two 220mg tabs twice a day Ibuprofen (Advil, etc) Three 200mg tabs four times a day (every meal & bedtime) A  prescription for pain medication (such as percocet, vicodin, oxycodone, hydrocodone, etc) should be given to you upon discharge.  Take your pain medication as prescribed.  If you are having problems/concerns with the prescription medicine (does not control pain, nausea, vomiting, rash, itching, etc), please call us [phone] to see if we need to switch you to a different pain medicine that will work better for you and/or control your side effect better. If you need a refill on your pain medication, please contact your pharmacy.  They will contact our office to request authorization. Prescriptions will not be  filled after 5 pm or on week-ends.   Avoid getting constipated.  Between the surgery and the pain medications, it is common to experience some constipation.  Increasing fluid intake and taking a fiber supplement (such as Metamucil, Citrucel, FiberCon, MiraLax, etc) 1-2 times a day regularly will usually help prevent this problem from occurring.  A mild laxative (prune juice, Milk of Magnesia, MiraLax, etc) should be taken according to package directions if there are no bowel movements after 48 hours.   Watch out for diarrhea.  If you have many loose bowel movements, simplify your diet to bland foods & liquids for a few days.  Stop any stool softeners and decrease your fiber supplement.  Switching to mild anti-diarrheal medications (Kayopectate, Pepto Bismol) can help.  If this worsens or does not improve, please call us. Wash / shower every day.  You may shower over the dressings as they are waterproof.  Continue to shower over incision(s) after the dressing is off. Remove your waterproof bandages 5 days after surgery.  You may leave the incision open to air.  You may replace a dressing/Band-Aid to cover the incision for comfort if you wish.  ACTIVITIES as tolerated:   You may resume regular (light) daily activities beginning the next day--such as daily self-care, walking, climbing stairs--gradually increasing activities as tolerated.  If you can walk 30 minutes without difficulty, it is safe to try more intense activity such as jogging, treadmill, bicycling, low-impact aerobics, swimming, etc. Save the most intensive and strenuous activity for last such as sit-ups, heavy   lifting, contact sports, etc  Refrain from any heavy lifting or straining until you are off narcotics for pain control.   DO NOT PUSH THROUGH PAIN.  Let pain be your guide: If it hurts to do something, don't do it.  Pain is your body warning you to avoid that activity for another week until the pain goes down. You may drive when you are  no longer taking prescription pain medication, you can comfortably wear a seatbelt, and you can safely maneuver your car and apply brakes. You may have sexual intercourse when it is comfortable.  FOLLOW UP in our office Please call [facility] at [phone] to set up an appointment to see your surgeon in the office for a follow-up appointment approximately 2-3 weeks after your surgery. Make sure that you call for this appointment the day you arrive home to insure a convenient appointment time. 10. IF YOU HAVE DISABILITY OR FAMILY LEAVE FORMS, BRING THEM TO THE OFFICE FOR PROCESSING.  DO NOT GIVE THEM TO YOUR DOCTOR.   WHEN TO CALL US [phone]: Poor pain control Reactions / problems with new medications (rash/itching, nausea, etc)  Fever over 101.5 F (38.5 C) Inability to urinate Nausea and/or vomiting Worsening swelling or bruising Continued bleeding from incision. Increased pain, redness, or drainage from the incision   The clinic staff is available to answer your questions during regular business hours (8:30am-5pm).  Please don't hesitate to call and ask to speak to one of our nurses for clinical concerns.   If you have a medical emergency, go to the nearest emergency room or call 911.  A surgeon from [facility] is always on call at the hospitals   [facility] [address], [address], [place], [place]  [address] ? MAIN: [phone] ? TOLL FREE: [phone] ?  FAX [phone] [email or web address]   

## 2023-01-23 NOTE — Discharge Summary (Signed)
Physician Discharge Summary  Patient ID: Tammy [name] MRN: [id number] DOB/AGE: 11/25/1960 62 y.o.  Admit date: 01/22/2023 Discharge date: 01/23/2023  Admission Diagnoses:  Discharge Diagnoses:  Principal Problem:   Status post surgery Active Problems:   S/P laparoscopic appendectomy   Discharged Condition: good  Hospital Course: Patient was admitted to the med surg floor after surgery.  Diet was advanced as tolerated.  By postop day 1, she was tolerating a solid diet and pain was controlled with oral medications.  She was urinating without difficulty and ambulating without assistance.  Patient was felt to be in stable condition for discharge to home.   Consults: None  Significant Diagnostic Studies: labs: cbc, bmet and radiology: CT scan: appendicitis  Treatments: IV hydration, antibiotics: Zosyn, analgesia: acetaminophen and Dilaudid, and surgery: lap appendectomy  Discharge Exam: Blood pressure 102/64, pulse 87, temperature 97.9 F (36.6 C), temperature source Oral, resp. rate 17, weight 73.2 kg, last menstrual period 11/05/2007, SpO2 96 %. General appearance: alert and cooperative GI: normal findings: soft, appropriately tender Skin: incisions clean, dry, intact  Disposition: Discharge disposition: 01-Home or Self Care        Allergies as of 01/23/2023       Reactions   Egg-derived Products Other (See Comments)   Makes pt feel like burping up rotten eggs        Medication List     STOP taking these medications    Comirnaty syringe Generic drug: COVID-19 mRNA vaccine 2023-2024       TAKE these medications    ACETAMINOPHEN-CAFFEINE PO Take by mouth in the morning.   amoxicillin-clavulanate 875-125 MG tablet Commonly known as: AUGMENTIN Take 1 tablet by mouth 2 (two) times daily for 5 days.   ARIPiprazole 2 MG tablet Commonly known as: ABILIFY Take 0.5-1 tablets (1-2 mg total) by mouth daily.   clonazePAM 0.5 MG tablet Commonly known as:  KLONOPIN Take 1 tablet (0.5 mg total) by mouth daily as needed.   FLUoxetine 10 MG capsule Commonly known as: PROZAC Take 3 capsules (30 mg total) by mouth daily.   hydrochlorothiazide 25 MG tablet Commonly known as: HYDRODIURIL Take 1 tablet (25 mg total) by mouth daily.   lisdexamfetamine 20 MG capsule Commonly known as: VYVANSE Take 1 capsule by mouth daily.   lisdexamfetamine 20 MG capsule Commonly known as: Vyvanse Take 1 capsule (20 mg total) by mouth in the morning.   lisdexamfetamine 20 MG capsule Commonly known as: VYVANSE Take 1 capsule (20 mg total) by mouth daily. Start taking on: Feb 17, 2023   MAGNESIUM CHLORIDE-CALCIUM PO Take by mouth.   Melatonin 3 MG Caps Take 6 mg by mouth at bedtime.   montelukast 10 MG tablet Commonly known as: SINGULAIR Take 1 tablet (10 mg total) by mouth daily.   mupirocin ointment 2 % Commonly known as: BACTROBAN Apply 1 application topically to upper back twice daily 15 days   ondansetron 4 MG disintegrating tablet Commonly known as: ZOFRAN-ODT Dissolve 1 tablet on the tongue every 8 hours as needed   pantoprazole 20 MG tablet Commonly known as: PROTONIX Take 1 tablet by mouth daily.   PROBIOTIC DAILY PO Take by mouth.   tacrolimus 0.1 % ointment Commonly known as: PROTOPIC Apply as directed to affected area twice a day   traMADol 50 MG tablet Commonly known as: ULTRAM Take 1-2 tablets (50-100 mg total) by mouth every 6 (six) hours as needed (postop pain not controlled with ibuprofen first).   Wegovy 1 MG/0.5ML Soaj  Generic drug: Semaglutide-Weight Management Inject 1 mg into the skin once a week. What changed: Another medication with the same name was removed. Continue taking this medication, and follow the directions you see here.   Zepbound 2.5 MG/0.5ML Pen Generic drug: tirzepatide Inject 2.5 mg into the skin once a week.   Zepbound 5 MG/0.5ML Pen Generic drug: tirzepatide Inject 5 mg into the skin  every 7 (seven) days.   Zepbound 7.5 MG/0.5ML Pen Generic drug: tirzepatide Inject 7.5 mg into the skin once a week.   Zepbound 15 MG/0.5ML Pen Generic drug: tirzepatide Inject 15 mg into the skin every 7 (seven) days.   Zepbound 15 MG/0.5ML Pen Generic drug: tirzepatide Inject 15 mg into the skin once a week.   Zepbound 15 MG/0.5ML Pen Generic drug: tirzepatide Inject 15 mg into the skin once a week.   Zepbound 15 MG/0.5ML Pen Generic drug: tirzepatide Inject 15 mg into the skin once a week.        Follow-up Information     [facility], [place]. Schedule an appointment as soon as possible for a visit in 2 week(s).   Specialty: General Surgery Contact information: [address] [place] [address] [phone]                Signed: Vanita Panda 01/23/2023, 9:46 AM

## 2023-01-23 NOTE — Plan of Care (Signed)
Pt discharged to home with family by walking out. Reviewed all homecare instructions ( and copy given) with pt and family, new meds (Augmentin), Ultram, pain control alternatives, follow up appts and to call [facility] in the am to schedule an appt for 2 week follow up with them.  Reviewed what to call MD for, activity, and meds.

## 2023-01-24 ENCOUNTER — Other Ambulatory Visit (HOSPITAL_COMMUNITY): Payer: Self-pay

## 2023-01-24 ENCOUNTER — Other Ambulatory Visit: Payer: Self-pay

## 2023-01-24 ENCOUNTER — Telehealth: Payer: Self-pay

## 2023-01-24 ENCOUNTER — Other Ambulatory Visit: Payer: Self-pay | Admitting: Family Medicine

## 2023-01-24 MED ORDER — HYDROCHLOROTHIAZIDE 25 MG PO TABS
25.0000 mg | ORAL_TABLET | Freq: Every day | ORAL | 3 refills | Status: DC
  Filled 2023-01-24: qty 90, 90d supply, fill #0
  Filled 2023-04-25: qty 90, 90d supply, fill #1
  Filled 2023-07-18: qty 90, 90d supply, fill #2
  Filled 2023-10-18: qty 90, 90d supply, fill #3

## 2023-01-24 NOTE — Transitions of Care (Post Inpatient/ED Visit) (Unsigned)
   01/24/2023  Name: Tammy [name] MRN: [id number] DOB: 04/24/1961  Today's TOC FU Call Status: Today's TOC FU Call Status:: Unsuccessul Call (1st Attempt) Unsuccessful Call (1st Attempt) Date: 01/24/23  Attempted to reach the patient regarding the most recent Inpatient/ED visit.  Follow Up Plan: Additional outreach attempts will be made to reach the patient to complete the Transitions of Care (Post Inpatient/ED visit) call.   Signature Vicente Males, LPN [facility] AWV Team Direct Dial: [phone]

## 2023-01-25 LAB — SURGICAL PATHOLOGY

## 2023-01-25 NOTE — Telephone Encounter (Signed)
We are available if needed but this is primarily going to be surgical follow-up.  I will certainly check in on her on June 4 as scheduled visit

## 2023-01-25 NOTE — Transitions of Care (Post Inpatient/ED Visit) (Cosign Needed)
01/25/2023  Name: Tammy [name] MRN: [id number] DOB: November 17, 1960  Today's TOC FU Call Status: Today's TOC FU Call Status:: Successful TOC FU Call Competed Unsuccessful Call (1st Attempt) Date: 01/24/23 [facility] FU Call Complete Date: 01/25/23  Transition Care Management Follow-up Telephone Call Date of Discharge: 01/22/23 Discharge Facility: [facility]) Type of Discharge: Inpatient Admission Primary Inpatient Discharge Diagnosis:: laproscopic appendectomy How have you been since you were released from the hospital?: Better Any questions or concerns?: No  Items Reviewed: Did you receive and understand the discharge instructions provided?: Yes Medications obtained,verified, and reconciled?: Yes (Medications Reviewed) Any new allergies since your discharge?: No Dietary orders reviewed?: NA Do you have support at home?: Yes People in Home: spouse  Medications Reviewed Today: Medications Reviewed Today     Reviewed by Sue Lush, LPN (Licensed Practical Nurse) on 01/25/23 at 0831  Med List Status: <None>   Medication Order Taking? Sig Documenting Provider Last Dose Status Informant  ACETAMINOPHEN-CAFFEINE PO [id number]  Take by mouth in the morning. [provider]  Active   amoxicillin-clavulanate (AUGMENTIN) 875-125 MG tablet [id number] Yes Take 1 tablet by mouth 2 (two) times daily for 5 days. Romie Levee, MD Taking Active   ARIPiprazole (ABILIFY) 2 MG tablet [id number]  Take 0.5-1 tablets (1-2 mg total) by mouth daily. Corie Chiquito, PMHNP  Active   clonazePAM (KLONOPIN) 0.5 MG tablet [id number]  Take 1 tablet (0.5 mg total) by mouth daily as needed. Corie Chiquito, PMHNP  Expired 06/26/22 2359   FLUoxetine (PROZAC) 10 MG capsule [id number]  Take 3 capsules (30 mg total) by mouth daily. Corie Chiquito, PMHNP  Active   hydrochlorothiazide (HYDRODIURIL) 25 MG tablet [id number]  Take 1 tablet (25 mg total) by mouth daily. Shelva Majestic, MD  Active    lisdexamfetamine (VYVANSE) 20 MG capsule [id number]  Take 1 capsule by mouth daily. Corie Chiquito, PMHNP  Active   lisdexamfetamine (VYVANSE) 20 MG capsule [id number]  Take 1 capsule (20 mg total) by mouth in the morning.   Active   lisdexamfetamine (VYVANSE) 20 MG capsule [id number]  Take 1 capsule (20 mg total) by mouth daily. Corie Chiquito, PMHNP  Active   MAGNESIUM CHLORIDE-CALCIUM PO [id number]  Take by mouth. [provider]  Active   Melatonin 3 MG CAPS [id number]  Take 6 mg by mouth at bedtime. [provider]  Active   montelukast (SINGULAIR) 10 MG tablet [id number] No Take 1 tablet (10 mg total) by mouth daily.  Patient not taking: Reported on 12/16/2022    Not Taking Active   mupirocin ointment (BACTROBAN) 2 % [id number]  Apply 1 application topically to upper back twice daily 15 days   Active   ondansetron (ZOFRAN-ODT) 4 MG disintegrating tablet [id number] No Dissolve 1 tablet on the tongue every 8 hours as needed  Patient not taking: Reported on 01/25/2023    Not Taking Active   pantoprazole (PROTONIX) 20 MG tablet [id number]  Take 1 tablet by mouth daily. Shelva Majestic, MD  Active   Probiotic Product (PROBIOTIC DAILY PO) [id number]  Take by mouth. [provider]  Active   Semaglutide-Weight Management [facility]) 1 MG/0.5ML Ivory Broad [id number]  Inject 1 mg into the skin once a week.   Active   tacrolimus (PROTOPIC) 0.1 % ointment [id number]  Apply as directed to affected area twice a day   Active   tirzepatide (ZEPBOUND) 15 MG/0.5ML Pen [id number] No Inject 15 mg into the skin every 7 (seven) days.  Patient not taking: Reported  on 01/25/2023    Not Taking Active   tirzepatide (ZEPBOUND) 15 MG/0.5ML Pen [id number] No Inject 15 mg into the skin once a week.  Patient not taking: Reported on 01/25/2023    Not Taking Active   tirzepatide (ZEPBOUND) 15 MG/0.5ML Pen [id number] No Inject 15 mg into the skin once a week.  Patient not taking: Reported on 01/25/2023    Not Taking  Active   tirzepatide (ZEPBOUND) 15 MG/0.5ML Pen [id number] No Inject 15 mg into the skin once a week.  Patient not taking: Reported on 01/25/2023    Not Taking Active   tirzepatide (ZEPBOUND) 2.5 MG/0.5ML Pen [id number] No Inject 2.5 mg into the skin once a week.  Patient not taking: Reported on 01/25/2023    Not Taking Active   tirzepatide (ZEPBOUND) 5 MG/0.5ML Pen [id number] No Inject 5 mg into the skin every 7 (seven) days.  Patient not taking: Reported on 01/25/2023    Not Taking Active   tirzepatide (ZEPBOUND) 7.5 MG/0.5ML Pen [id number] No Inject 7.5 mg into the skin once a week.  Patient not taking: Reported on 01/25/2023    Not Taking Active   traMADol (ULTRAM) 50 MG tablet [id number] Yes Take 1-2 tablets (50-100 mg total) by mouth every 6 (six) hours as needed (postop pain not controlled with ibuprofen first). Romie Levee, MD Taking Active             Home Care and Equipment/Supplies: Were Home Health Services Ordered?: No Any new equipment or medical supplies ordered?: No  Functional Questionnaire: Do you need assistance with bathing/showering or dressing?: No Do you need assistance with meal preparation?: No Do you need assistance with eating?: No Do you have difficulty maintaining continence: No Do you need assistance with getting out of bed/getting out of a chair/moving?: No Do you have difficulty managing or taking your medications?: No  Follow up appointments reviewed: PCP Follow-up appointment confirmed?: NA Specialist Hospital Follow-up appointment confirmed?: No Reason Specialist Follow-Up Not Confirmed: Patient has Specialist Provider Number and will Call for Appointment (pt called Dr Lucilla Lame office, they asked her to call back for appt:need to put her in the system) Do you need transportation to your follow-up appointment?: No Do you understand care options if your condition(s) worsen?: Yes-patient verbalized understanding  Pt indicates she is doing better. She has  no appetite but is drinking. She indicates when trying to eat she gets belly pain. Pt is taking Augmentin which can cause some abdominal discomfort. Encouraged her to take with toast or grits, something on her stomach. pt to try bland foods (rice, potatoes, toast), simple foods and/or Ensure or boost if she cannot eat. Also to ask when she calls the surgeon what he recommends. Pt is able to care for herself and has no other questions.  SIGNATURE  Vicente Males, LPN [facility] AWV Team Direct Dial: [phone]

## 2023-02-02 ENCOUNTER — Other Ambulatory Visit (HOSPITAL_COMMUNITY): Payer: Self-pay

## 2023-02-03 DIAGNOSIS — F411 Generalized anxiety disorder: Secondary | ICD-10-CM | POA: Diagnosis not present

## 2023-02-03 DIAGNOSIS — F332 Major depressive disorder, recurrent severe without psychotic features: Secondary | ICD-10-CM | POA: Diagnosis not present

## 2023-02-12 ENCOUNTER — Other Ambulatory Visit: Payer: Self-pay | Admitting: Family Medicine

## 2023-02-15 ENCOUNTER — Other Ambulatory Visit: Payer: Self-pay

## 2023-02-15 MED ORDER — PANTOPRAZOLE SODIUM 20 MG PO TBEC
20.0000 mg | DELAYED_RELEASE_TABLET | Freq: Every day | ORAL | 3 refills | Status: DC
  Filled 2023-02-15: qty 90, 90d supply, fill #0
  Filled 2023-05-11: qty 90, 90d supply, fill #1
  Filled 2023-08-09: qty 90, 90d supply, fill #2
  Filled 2023-11-07: qty 90, 90d supply, fill #3

## 2023-02-17 DIAGNOSIS — F332 Major depressive disorder, recurrent severe without psychotic features: Secondary | ICD-10-CM | POA: Diagnosis not present

## 2023-02-17 DIAGNOSIS — F411 Generalized anxiety disorder: Secondary | ICD-10-CM | POA: Diagnosis not present

## 2023-02-22 ENCOUNTER — Ambulatory Visit (INDEPENDENT_AMBULATORY_CARE_PROVIDER_SITE_OTHER): Payer: 59 | Admitting: Family Medicine

## 2023-02-22 ENCOUNTER — Encounter: Payer: Self-pay | Admitting: Family Medicine

## 2023-02-22 VITALS — BP 110/64 | HR 91 | Temp 97.0°F | Ht 61.0 in | Wt 159.4 lb

## 2023-02-22 DIAGNOSIS — E785 Hyperlipidemia, unspecified: Secondary | ICD-10-CM

## 2023-02-22 DIAGNOSIS — Z0000 Encounter for general adult medical examination without abnormal findings: Secondary | ICD-10-CM

## 2023-02-22 DIAGNOSIS — K219 Gastro-esophageal reflux disease without esophagitis: Secondary | ICD-10-CM | POA: Diagnosis not present

## 2023-02-22 DIAGNOSIS — E669 Obesity, unspecified: Secondary | ICD-10-CM

## 2023-02-22 DIAGNOSIS — Z131 Encounter for screening for diabetes mellitus: Secondary | ICD-10-CM

## 2023-02-22 DIAGNOSIS — F325 Major depressive disorder, single episode, in full remission: Secondary | ICD-10-CM

## 2023-02-22 DIAGNOSIS — I10 Essential (primary) hypertension: Secondary | ICD-10-CM | POA: Diagnosis not present

## 2023-02-22 LAB — LIPID PANEL
Cholesterol: 252 mg/dL — ABNORMAL HIGH (ref 0–200)
HDL: 83.9 mg/dL (ref >39.00)
LDL Cholesterol: 151 mg/dL — ABNORMAL HIGH (ref 0–99)
NonHDL: 167.66
Total CHOL/HDL Ratio: 3
Triglycerides: 84 mg/dL (ref 0.0–149.0)
VLDL: 16.8 mg/dL (ref 0.0–40.0)

## 2023-02-22 LAB — COMPREHENSIVE METABOLIC PANEL
ALT: 20 U/L (ref 0–35)
AST: 22 U/L (ref 0–37)
Albumin: 4.1 g/dL (ref 3.5–5.2)
Alkaline Phosphatase: 73 U/L (ref 39–117)
BUN: 21 mg/dL (ref 6–23)
CO2: 28 mEq/L (ref 19–32)
Calcium: 9.5 mg/dL (ref 8.4–10.5)
Chloride: 102 mEq/L (ref 96–112)
Creatinine, Ser: 0.72 mg/dL (ref 0.40–1.20)
GFR: 88.69 mL/min (ref >60.00)
Glucose, Bld: 110 mg/dL — ABNORMAL HIGH (ref 70–99)
Potassium: 3.9 mEq/L (ref 3.5–5.1)
Sodium: 139 mEq/L (ref 135–145)
Total Bilirubin: 0.3 mg/dL (ref 0.2–1.2)
Total Protein: 6.7 g/dL (ref 6.0–8.3)

## 2023-02-22 LAB — CBC WITH DIFFERENTIAL/PLATELET
Basophils Absolute: 0 10*3/uL (ref 0.0–0.1)
Basophils Relative: 0.8 % (ref 0.0–3.0)
Eosinophils Absolute: 0.3 10*3/uL (ref 0.0–0.7)
Eosinophils Relative: 7.6 % — ABNORMAL HIGH (ref 0.0–5.0)
HCT: 37.1 % (ref 36.0–46.0)
Hemoglobin: 11.8 g/dL — ABNORMAL LOW (ref 12.0–15.0)
Lymphocytes Relative: 23 % (ref 12.0–46.0)
Lymphs Abs: 0.9 10*3/uL (ref 0.7–4.0)
MCHC: 31.7 g/dL (ref 30.0–36.0)
MCV: 80.6 fl (ref 78.0–100.0)
Monocytes Absolute: 0.3 10*3/uL (ref 0.1–1.0)
Monocytes Relative: 7.8 % (ref 3.0–12.0)
Neutro Abs: 2.3 10*3/uL (ref 1.4–7.7)
Neutrophils Relative %: 60.8 % (ref 43.0–77.0)
Platelets: 259 10*3/uL (ref 150.0–400.0)
RBC: 4.61 Mil/uL (ref 3.87–5.11)
RDW: 13.4 % (ref 11.5–15.5)
WBC: 3.8 10*3/uL — ABNORMAL LOW (ref 4.0–10.5)

## 2023-02-22 LAB — URINALYSIS, ROUTINE W REFLEX MICROSCOPIC
Bilirubin Urine: NEGATIVE
Hgb urine dipstick: NEGATIVE
Ketones, ur: NEGATIVE
Leukocytes,Ua: NEGATIVE
Nitrite: NEGATIVE
RBC / HPF: NONE SEEN (ref 0–?)
Specific Gravity, Urine: 1.015 (ref 1.000–1.030)
Total Protein, Urine: NEGATIVE
Urine Glucose: NEGATIVE
Urobilinogen, UA: 0.2 (ref 0.0–1.0)
WBC, UA: NONE SEEN (ref 0–?)
pH: 8 (ref 5.0–8.0)

## 2023-02-22 LAB — HEMOGLOBIN A1C: Hgb A1c MFr Bld: 5.9 % (ref 4.6–6.5)

## 2023-02-22 LAB — TSH: TSH: 1.72 u[IU]/mL (ref 0.35–5.50)

## 2023-02-22 NOTE — Patient Instructions (Addendum)
Please stop by lab before you go If you have mychart- we will send your results within 3 business days of [place] receiving them.  If you do not have mychart- we will call you about results within 5 business days of [place] receiving them.  *please also note that you will see labs on mychart as soon as they post. I will later go in and write notes on them- will say "notes from Dr. Durene Cal"   Glad you are doing so well even with recent surgery. Lets see what Dr. Earlene Plater says about your "stash" of zepbound and how we can best utilize  Recommended follow up: Return in about 1 year (around 02/22/2024) for physical or sooner if needed.Schedule b4 you leave.

## 2023-02-22 NOTE — Progress Notes (Signed)
Phone [phone]   Subjective:  Patient presents today for their annual physical. Chief complaint-noted.   See problem oriented charting- ROS- full  review of systems was completed and negative Per full ROS sheet completed by patient  The following were reviewed and entered/updated in epic: Past Medical History:  Diagnosis Date   Arthritis    Chronic fatigue syndrome    Diverticulosis    External hemorrhoids    Hypertension    Internal hemorrhoids    Neuropathy    Spinal stenosis    Patient Active Problem List   Diagnosis Date Noted   Anxiety 07/24/2020    Priority: Medium    Attention deficit disorder (ADD) in adult 03/31/2020    Priority: Medium    Major depressive disorder in full remission (HCC) 02/15/2020    Priority: Medium    GERD (gastroesophageal reflux disease) 02/15/2020    Priority: Medium    Essential hypertension 02/18/2014    Priority: Medium    Hyperlipidemia 09/18/2012    Priority: Medium    Allergic rhinitis 03/09/2016    Priority: Low   Chronic pain syndrome 02/22/2013    Priority: Low   Chronic insomnia 02/22/2013    Priority: Low   Paresthesias 10/25/2012    Priority: Low   Status post surgery 01/22/2023   S/P laparoscopic appendectomy 01/22/2023   Unilateral primary osteoarthritis, left hip 06/25/2022   Hip flexor tendon tightness, left 04/02/2022   Greater trochanteric bursitis of left hip 01/11/2022   Nonallopathic lesion of cervical region 04/03/2020   Nonallopathic lesion of thoracic region 04/03/2020   Nonallopathic lesion of lumbar region 04/03/2020   Cervical stenosis of spine 03/31/2020   Low bone density 03/17/2020   Past Surgical History:  Procedure Laterality Date   APPENDECTOMY     CARDIAC CATHETERIZATION  09/18/2012   LAPAROSCOPIC APPENDECTOMY N/A 01/22/2023   Procedure: APPENDECTOMY LAPAROSCOPIC;  Surgeon: Andria Meuse, MD;  Location: [facility] OR;  Service: General;  Laterality: N/A;   LEFT HEART CATHETERIZATION  WITH CORONARY ANGIOGRAM N/A 09/18/2012   Procedure: LEFT HEART CATHETERIZATION WITH CORONARY ANGIOGRAM;  Surgeon: Tonny Bollman, MD;  Location: [facility] CATH LAB;  Service: Cardiovascular;  Laterality: N/A;   TONSILLECTOMY  09/21/1963    Family History  Problem Relation Age of Onset   Heart attack Maternal Grandmother 50   Arthritis Maternal Grandmother    Hyperlipidemia Maternal Grandmother    Heart disease Maternal Grandmother    Diabetes Maternal Grandmother    Pancreatic cancer Mother        died 24. 2.5 years past diagnosis.     Anxiety disorder Mother    Depression Mother    Colon polyps Father    Healthy Sister    Healthy Brother    Depression Brother    Heart attack Brother        age 44   Suicidality Maternal Grandfather    Colon cancer Neg Hx    Esophageal cancer Neg Hx    Kidney disease Neg Hx     Medications- reviewed and updated Current Outpatient Medications  Medication Sig Dispense Refill   ACETAMINOPHEN-CAFFEINE PO Take by mouth in the morning.     ARIPiprazole (ABILIFY) 2 MG tablet Take 0.5-1 tablets (1-2 mg total) by mouth daily. 90 tablet 1   FLUoxetine (PROZAC) 10 MG capsule Take 3 capsules (30 mg total) by mouth daily. 270 capsule 1   hydrochlorothiazide (HYDRODIURIL) 25 MG tablet Take 1 tablet (25 mg total) by mouth daily. 90 tablet 3  lisdexamfetamine (VYVANSE) 20 MG capsule Take 1 capsule (20 mg total) by mouth daily. 90 capsule 0   MAGNESIUM CHLORIDE-CALCIUM PO Take by mouth.     Melatonin 3 MG CAPS Take 6 mg by mouth at bedtime.     pantoprazole (PROTONIX) 20 MG tablet Take 1 tablet (20 mg total) by mouth daily. 90 tablet 3   Probiotic Product (PROBIOTIC DAILY PO) Take by mouth.     tacrolimus (PROTOPIC) 0.1 % ointment Apply as directed to affected area twice a day 30 g 11   clonazePAM (KLONOPIN) 0.5 MG tablet Take 1 tablet (0.5 mg total) by mouth daily as needed. 30 tablet 0   No current facility-administered medications for this visit.     Allergies-reviewed and updated Allergies  Allergen Reactions   Egg-Derived Products Other (See Comments)    Makes pt feel like burping up rotten eggs    Social History   Social History Narrative   Married. 2 adopted children- adopted daughter (64) when single after first marriage from [place]. Son from [place] with 2nd husband(18) in 2021. 1st husband local physician Consuela Mimes      LCSW with [employer]      Hobbies: animal rescue- has litter of kittens in 2021, walking, time outside   Objective  Objective:  BP 110/64   Pulse 91   Temp (!) 97 F (36.1 C)   Ht 5\' 1"  (1.549 m)   Wt 159 lb 6.4 oz (72.3 kg)   LMP 11/05/2007   SpO2 97%   BMI 30.12 kg/m  Gen: NAD, resting comfortably HEENT: Mucous membranes are moist. Oropharynx normal Neck: no thyromegaly CV: RRR no murmurs rubs or gallops Lungs: CTAB no crackles, wheeze, rhonchi Abdomen: soft/nontender/nondistended/normal bowel sounds. No rebound or guarding.  Ext: no edema Skin: warm, dry Neuro: grossly normal, moves all extremities, PERRLA   Assessment and Plan   62 y.o. female presenting for annual physical.  Health Maintenance counseling: 1. Anticipatory guidance: Patient counseled regarding regular dental exams -q6 months, eye exams yearly,  avoiding smoking and second hand smoke , limiting alcohol to 1 beverage per day- no alcohol  , no illicit drugs .   2. Risk factor reduction:  Advised patient of need for regular exercise and diet rich and fruits and vegetables to reduce risk of heart attack and stroke.  Exercise- in the pool again after her surgery- 3 days a week Diet/weight management-last summer after vacation got up to 170 was having more sugar, working with Dr. Earlene Plater and on Ozempic now- has gotten as low as 143 but then Ozempic was not approved. Feels like she is struggling with food. Meets with Dr. Garfield Cornea Readings from Last 3 Encounters:  02/22/23 159 lb 6.4 oz (72.3 kg)  01/23/23 161 lb 6  oz (73.2 kg)  09/17/22 149 lb (67.6 kg)  3. Immunizations/screenings/ancillary studies- up to date   Immunization History  Administered Date(s) Administered   COVID-19, mRNA, vaccine(Comirnaty)12 years and older 07/07/2022   Influenza,inj,Quad PF,6+ Mos 06/19/2013   Influenza-Unspecified 06/29/2020   PFIZER Comirnaty(Gray Top)Covid-19 Tri-Sucrose Vaccine 01/28/2021   PFIZER(Purple Top)SARS-COV-2 Vaccination 09/25/2019, 10/16/2019   Pfizer Covid-19 Vaccine Bivalent Booster 40yrs & up 07/09/2021   Tdap 08/22/2014   Unspecified SARS-COV-2 Vaccination 09/25/2019   Zoster Recombinat (Shingrix) 02/18/2021, 06/23/2021   Zoster, Live 02/18/2021  4. Cervical cancer screening- 06/04/21 completed with Dr. Adalberto Ill with 3 year repeat  5. Breast cancer screening-  breast exam with GYN- referred last year and mammogram -  03/03/22- does  yearly 6. Colon cancer screening - 02/04/22 with 10 year repeat - does have prolapse rectal and referred to pelvic floor physical therapy- had some benefit 7. Skin cancer screening- saw [place] derm- Dr. Roderic Scarce . advised regular sunscreen use. Denies worrisome, changing, or new skin lesions.  8. Birth control/STD check- postmenopause/monogamous   9. Osteoporosis screening at 27-  2016 by physicians for women with mild osteopenia worst t score -1.2 and last  year wanted to have repeat with gyn- reports was updated and told reassuring-  - will try to obtain at 63 10. Smoking associated screening - never smoker   Status of chronic or acute concerns   #appendectomy due to appendicitis on 01/22/2023-patient reports recovering well   #hypertension S: medication: Hydrochlorothiazide 25 mg A/P: stable- continue current medicines    #hyperlipidemia-CT cardiac scoring 04/23/22 score of 0 S: Medication:None -Small vessel disease was noted on MRI of the brain in the past but patient has wanted to work on lifestyle changes -half brother with MI in 30s but different mothers Lab Results   Component Value Date   CHOL 247 (H) 02/19/2022   HDL 98.70 02/19/2022   LDLCALC 138 (H) 02/19/2022   TRIG 53.0 02/19/2022   CHOLHDL 3 02/19/2022   A/P: with reassuring CT calcium wants to work on lifestyle over starting medications- update lipid panel with labs today- does have some small vessel changes on brain   % # Depression/anxiety/ADD-now managed by psychiatry Corie Chiquito, NP S: Medication:Fluoxetine 10 mg - 3 tablets daily for depression and anxiety, Abilify 1-2 mg for depression, Vyvanse 20 mg for ADD -Also on clonazepam as needed for anxiety -Also on melatonin for sleep    02/22/2023    7:59 AM 02/19/2022    8:00 AM 02/18/2021    8:04 AM  Depression screen PHQ 2/9  Decreased Interest 0 0 0  Down, Depressed, Hopeless 1 0 0  PHQ - 2 Score 1 0 0  Altered sleeping 0 0   Tired, decreased energy 0 0   Change in appetite 1 0   Feeling bad or failure about yourself  1 0   Trouble concentrating 0 0   Moving slowly or fidgety/restless 0 0   Suicidal thoughts 0 0   PHQ-9 Score 3 0   Difficult doing work/chores Not difficult at all Not difficult at all   A/P: doing well on all 3 fronts- continue current medications    # GERD S:Medication: Protonix 20 mg. Prior pecid failure A/P: stable- continue current medicines     # Hip pain-works with Dr. Jacob Moores visit in [place] also seen Dr. Katrinka Blazing in the past-trying to hold off on hip replacement . Epidural actually most helpful. Physical therapy not helpful . She is considering repeat injection- has gradually worsened over time.   #vitilgo- protopic as needed for the face   Recommended follow up: Return in about 1 year (around 02/22/2024) for physical or sooner if needed.Schedule b4 you leave. Future Appointments  Date Time Provider Department Center  06/20/2023  1:00 PM Corie Chiquito, PMHNP CP-CP None   Lab/Order associations: fasting   ICD-10-CM   1. Preventative health care  Z00.00 Comprehensive metabolic panel    CBC  with Differential/Platelet    Lipid panel    HgB A1c    TSH    Urinalysis, Routine w reflex microscopic    2. Hyperlipidemia, unspecified hyperlipidemia type  E78.5 Comprehensive metabolic panel    CBC with Differential/Platelet    Lipid panel  TSH    Urinalysis, Routine w reflex microscopic    3. Essential hypertension  I10 Urinalysis, Routine w reflex microscopic    4. Major depressive disorder with single episode, in full remission (HCC)  F32.5     5. Gastroesophageal reflux disease, unspecified whether esophagitis present  K21.9     6. Screening for diabetes mellitus  Z13.1 HgB A1c    7. Obesity (BMI 30-39.9)  E66.9 HgB A1c      No orders of the defined types were placed in this encounter.   Return precautions advised.  Tana Conch, MD

## 2023-02-25 ENCOUNTER — Other Ambulatory Visit: Payer: Self-pay

## 2023-02-25 ENCOUNTER — Other Ambulatory Visit (HOSPITAL_COMMUNITY): Payer: Self-pay

## 2023-03-03 DIAGNOSIS — F332 Major depressive disorder, recurrent severe without psychotic features: Secondary | ICD-10-CM | POA: Diagnosis not present

## 2023-03-03 DIAGNOSIS — F411 Generalized anxiety disorder: Secondary | ICD-10-CM | POA: Diagnosis not present

## 2023-03-09 ENCOUNTER — Other Ambulatory Visit (HOSPITAL_COMMUNITY): Payer: Self-pay

## 2023-03-09 DIAGNOSIS — G43819 Other migraine, intractable, without status migrainosus: Secondary | ICD-10-CM | POA: Diagnosis not present

## 2023-03-09 DIAGNOSIS — M5410 Radiculopathy, site unspecified: Secondary | ICD-10-CM | POA: Diagnosis not present

## 2023-03-09 DIAGNOSIS — E663 Overweight: Secondary | ICD-10-CM | POA: Diagnosis not present

## 2023-03-09 DIAGNOSIS — F5081 Binge eating disorder: Secondary | ICD-10-CM | POA: Diagnosis not present

## 2023-03-09 DIAGNOSIS — Z9049 Acquired absence of other specified parts of digestive tract: Secondary | ICD-10-CM | POA: Diagnosis not present

## 2023-03-09 DIAGNOSIS — Z8639 Personal history of other endocrine, nutritional and metabolic disease: Secondary | ICD-10-CM | POA: Diagnosis not present

## 2023-03-09 MED ORDER — TOPIRAMATE 25 MG PO TABS
25.0000 mg | ORAL_TABLET | Freq: Every day | ORAL | 0 refills | Status: DC
  Filled 2023-03-09: qty 30, 30d supply, fill #0

## 2023-03-10 ENCOUNTER — Other Ambulatory Visit (HOSPITAL_COMMUNITY): Payer: Self-pay

## 2023-03-10 ENCOUNTER — Other Ambulatory Visit: Payer: Self-pay

## 2023-03-10 MED ORDER — LISDEXAMFETAMINE DIMESYLATE 20 MG PO CAPS
20.0000 mg | ORAL_CAPSULE | Freq: Every day | ORAL | 0 refills | Status: AC
Start: 2023-05-20 — End: ?
  Filled 2023-06-09: qty 90, 90d supply, fill #0

## 2023-03-25 ENCOUNTER — Other Ambulatory Visit: Payer: Self-pay

## 2023-03-31 DIAGNOSIS — F411 Generalized anxiety disorder: Secondary | ICD-10-CM | POA: Diagnosis not present

## 2023-03-31 DIAGNOSIS — F332 Major depressive disorder, recurrent severe without psychotic features: Secondary | ICD-10-CM | POA: Diagnosis not present

## 2023-04-14 DIAGNOSIS — F5081 Binge eating disorder: Secondary | ICD-10-CM | POA: Diagnosis not present

## 2023-04-14 DIAGNOSIS — F418 Other specified anxiety disorders: Secondary | ICD-10-CM | POA: Diagnosis not present

## 2023-04-14 DIAGNOSIS — Z6829 Body mass index (BMI) 29.0-29.9, adult: Secondary | ICD-10-CM | POA: Diagnosis not present

## 2023-04-14 DIAGNOSIS — F411 Generalized anxiety disorder: Secondary | ICD-10-CM | POA: Diagnosis not present

## 2023-04-14 DIAGNOSIS — E663 Overweight: Secondary | ICD-10-CM | POA: Diagnosis not present

## 2023-04-14 DIAGNOSIS — F410 Panic disorder [episodic paroxysmal anxiety] without agoraphobia: Secondary | ICD-10-CM | POA: Diagnosis not present

## 2023-04-14 DIAGNOSIS — F332 Major depressive disorder, recurrent severe without psychotic features: Secondary | ICD-10-CM | POA: Diagnosis not present

## 2023-04-20 ENCOUNTER — Other Ambulatory Visit (HOSPITAL_BASED_OUTPATIENT_CLINIC_OR_DEPARTMENT_OTHER): Payer: Self-pay | Admitting: Family Medicine

## 2023-04-20 DIAGNOSIS — Z1231 Encounter for screening mammogram for malignant neoplasm of breast: Secondary | ICD-10-CM

## 2023-04-25 ENCOUNTER — Other Ambulatory Visit (HOSPITAL_COMMUNITY): Payer: Self-pay

## 2023-04-28 DIAGNOSIS — F332 Major depressive disorder, recurrent severe without psychotic features: Secondary | ICD-10-CM | POA: Diagnosis not present

## 2023-04-28 DIAGNOSIS — F411 Generalized anxiety disorder: Secondary | ICD-10-CM | POA: Diagnosis not present

## 2023-05-09 ENCOUNTER — Telehealth (HOSPITAL_BASED_OUTPATIENT_CLINIC_OR_DEPARTMENT_OTHER): Payer: Self-pay

## 2023-05-09 ENCOUNTER — Inpatient Hospital Stay (HOSPITAL_BASED_OUTPATIENT_CLINIC_OR_DEPARTMENT_OTHER): Admission: RE | Admit: 2023-05-09 | Payer: 59 | Source: Ambulatory Visit

## 2023-05-11 ENCOUNTER — Other Ambulatory Visit (HOSPITAL_BASED_OUTPATIENT_CLINIC_OR_DEPARTMENT_OTHER): Payer: Self-pay | Admitting: Family Medicine

## 2023-05-11 ENCOUNTER — Encounter (HOSPITAL_BASED_OUTPATIENT_CLINIC_OR_DEPARTMENT_OTHER): Payer: Self-pay

## 2023-05-11 ENCOUNTER — Ambulatory Visit (HOSPITAL_BASED_OUTPATIENT_CLINIC_OR_DEPARTMENT_OTHER)
Admission: RE | Admit: 2023-05-11 | Discharge: 2023-05-11 | Disposition: A | Discharge status: Home / Self Care | Payer: 59 | Source: Ambulatory Visit | Attending: Family Medicine | Admitting: Family Medicine

## 2023-05-11 ENCOUNTER — Other Ambulatory Visit: Payer: Self-pay

## 2023-05-11 DIAGNOSIS — Z1231 Encounter for screening mammogram for malignant neoplasm of breast: Secondary | ICD-10-CM

## 2023-05-12 DIAGNOSIS — F332 Major depressive disorder, recurrent severe without psychotic features: Secondary | ICD-10-CM | POA: Diagnosis not present

## 2023-05-12 DIAGNOSIS — F411 Generalized anxiety disorder: Secondary | ICD-10-CM | POA: Diagnosis not present

## 2023-05-25 DIAGNOSIS — E782 Mixed hyperlipidemia: Secondary | ICD-10-CM | POA: Diagnosis not present

## 2023-05-25 DIAGNOSIS — Z6830 Body mass index (BMI) 30.0-30.9, adult: Secondary | ICD-10-CM | POA: Diagnosis not present

## 2023-05-25 DIAGNOSIS — E669 Obesity, unspecified: Secondary | ICD-10-CM | POA: Diagnosis not present

## 2023-05-25 DIAGNOSIS — F411 Generalized anxiety disorder: Secondary | ICD-10-CM | POA: Diagnosis not present

## 2023-05-25 DIAGNOSIS — F5081 Binge eating disorder: Secondary | ICD-10-CM | POA: Diagnosis not present

## 2023-05-25 DIAGNOSIS — I10 Essential (primary) hypertension: Secondary | ICD-10-CM | POA: Diagnosis not present

## 2023-05-26 DIAGNOSIS — F411 Generalized anxiety disorder: Secondary | ICD-10-CM | POA: Diagnosis not present

## 2023-05-26 DIAGNOSIS — F332 Major depressive disorder, recurrent severe without psychotic features: Secondary | ICD-10-CM | POA: Diagnosis not present

## 2023-06-01 ENCOUNTER — Other Ambulatory Visit (HOSPITAL_BASED_OUTPATIENT_CLINIC_OR_DEPARTMENT_OTHER): Payer: Self-pay

## 2023-06-01 MED ORDER — COVID-19 MRNA VAC-TRIS(PFIZER) 30 MCG/0.3ML IM SUSY
0.3000 mL | PREFILLED_SYRINGE | Freq: Once | INTRAMUSCULAR | 0 refills | Status: AC
  Filled 2023-06-01: qty 0.3, 1d supply, fill #0

## 2023-06-08 DIAGNOSIS — F411 Generalized anxiety disorder: Secondary | ICD-10-CM | POA: Diagnosis not present

## 2023-06-08 DIAGNOSIS — F5081 Binge eating disorder: Secondary | ICD-10-CM | POA: Diagnosis not present

## 2023-06-08 DIAGNOSIS — E782 Mixed hyperlipidemia: Secondary | ICD-10-CM | POA: Diagnosis not present

## 2023-06-08 DIAGNOSIS — Z6830 Body mass index (BMI) 30.0-30.9, adult: Secondary | ICD-10-CM | POA: Diagnosis not present

## 2023-06-08 DIAGNOSIS — E669 Obesity, unspecified: Secondary | ICD-10-CM | POA: Diagnosis not present

## 2023-06-09 ENCOUNTER — Other Ambulatory Visit (HOSPITAL_BASED_OUTPATIENT_CLINIC_OR_DEPARTMENT_OTHER): Payer: Self-pay

## 2023-06-09 ENCOUNTER — Other Ambulatory Visit: Payer: Self-pay

## 2023-06-09 DIAGNOSIS — F411 Generalized anxiety disorder: Secondary | ICD-10-CM | POA: Diagnosis not present

## 2023-06-09 DIAGNOSIS — F332 Major depressive disorder, recurrent severe without psychotic features: Secondary | ICD-10-CM | POA: Diagnosis not present

## 2023-06-20 ENCOUNTER — Encounter: Payer: Self-pay | Admitting: Psychiatry

## 2023-06-20 ENCOUNTER — Other Ambulatory Visit: Payer: Self-pay

## 2023-06-20 ENCOUNTER — Ambulatory Visit (INDEPENDENT_AMBULATORY_CARE_PROVIDER_SITE_OTHER): Payer: 59 | Admitting: Psychiatry

## 2023-06-20 ENCOUNTER — Other Ambulatory Visit (HOSPITAL_COMMUNITY): Payer: Self-pay

## 2023-06-20 DIAGNOSIS — F3342 Major depressive disorder, recurrent, in full remission: Secondary | ICD-10-CM

## 2023-06-20 DIAGNOSIS — F419 Anxiety disorder, unspecified: Secondary | ICD-10-CM

## 2023-06-20 DIAGNOSIS — H5213 Myopia, bilateral: Secondary | ICD-10-CM | POA: Diagnosis not present

## 2023-06-20 DIAGNOSIS — F988 Other specified behavioral and emotional disorders with onset usually occurring in childhood and adolescence: Secondary | ICD-10-CM

## 2023-06-20 MED ORDER — CLONAZEPAM 0.5 MG PO TABS
0.5000 mg | ORAL_TABLET | Freq: Every day | ORAL | 0 refills | Status: AC | PRN
  Filled 2023-06-20: qty 30, 30d supply, fill #0

## 2023-06-20 MED ORDER — ARIPIPRAZOLE 2 MG PO TABS
1.0000 mg | ORAL_TABLET | Freq: Every day | ORAL | 1 refills | Status: DC
Start: 2023-06-20 — End: 2023-12-15
  Filled 2023-06-20: qty 90, 90d supply, fill #0
  Filled 2023-09-16: qty 90, 90d supply, fill #1

## 2023-06-20 MED ORDER — LISDEXAMFETAMINE DIMESYLATE 20 MG PO CAPS
20.0000 mg | ORAL_CAPSULE | Freq: Every day | ORAL | 0 refills | Status: DC
Start: 2023-09-01 — End: 2024-03-13
  Filled 2023-09-19 – 2023-12-08 (×3): qty 90, 90d supply, fill #0

## 2023-06-20 MED ORDER — FLUOXETINE HCL 10 MG PO CAPS
30.0000 mg | ORAL_CAPSULE | Freq: Every day | ORAL | 1 refills | Status: DC
Start: 2023-06-20 — End: 2023-12-15
  Filled 2023-06-20: qty 270, 90d supply, fill #0
  Filled 2023-09-16: qty 270, 90d supply, fill #1

## 2023-06-20 NOTE — Progress Notes (Signed)
Tammy [name] [id number] Jul 17, 1961 62 y.o.  Subjective:   Patient ID:  Tammy [name] is a 62 y.o. (DOB 07-28-1961) female.  Chief Complaint:  Chief Complaint  Patient presents with   Follow-up    Depression, anxiety, and ADHD    HPI Tammy [name] presents to the office today for follow-up of anxiety, depression, and ADHD. Tammy [name] reports that overall her anxiety has been ok. Tammy [name] reports that Tammy [name] has had some "visceral anxiety" this morning. Tammy [name] reports that today is "a different day" with this apt and an eye appointment, and seeing patients in between. Tammy [name] has anxiety and panic attacks in anticipation of a trip to see her 64 yo father (long car ride, family stressors, etc). Tammy [name] reports that Tammy [name] may have taken 1/2 of Klonopin at that time. Tammy [name] reports that her mood was "down" before this trip. Tammy [name] reports that her mood and anxiety improved after trip. Denies depression. Energy and motivation have been ok. Tammy [name] reports that Tammy [name] has gained weight with insurance no longer covering weight loss medication. Tammy [name] reports having "rebound hunger" and increased appetite. Concentration has been "fine." Denies SI.   Tammy [name] had to have emergency appendectomy. Tammy [name] was off work the following week.   Daughter is in PhD program.   Works remotely 4 days a week and works in person on Wednesday.   Vyvanse last filled 06/09/23 for 90 days.   Past Psychiatric Medication Trials: Cymbalta Zoloft- Interfered with concentration and thinking was not clear Lexapro- Nocturnal panic attacks.   Prozac- Was on 40 mg of Prozac at the time Tammy [name] was having akathisia with Abilify 2 mg. Higher doses seemed to exacerbate akathisia Abilify- helpful. Started on 2 mg daily. Had akathisia and twitching at 5 mg Rexulti- Had very low motivation and fatigue Lamictal- rash Klonopin- Takes as needed. Did not help with rumination. Ambien Vyvanse- took 20 mg in the past for ADHD. Re-started 10 mg dose. Remeron- Gained 20  lbs. Gabapentin- Given for neuropathy and caused cloudy thoughts.  Lamotrigine- tingling/burning around mouth and vagina. No rash or blisters.  May have been helpful for mood. Topamax   Had [facility] testing that indicated adverse effects to Zoloft, Lexapro, and Paxil.  AIMS    Flowsheet Row Office Visit from 12/16/2022 in [facility] Office Visit from 06/18/2022 in [facility] Office Visit from 07/29/2021 in [facility] Office Visit from 06/09/2021 in [facility]  AIMS Total Score 0 0 0 0      GAD-7    Flowsheet Row Office Visit from 02/22/2023 in [facility] Visit from 02/18/2021 in [facility]  Total GAD-7 Score 0 0      PHQ2-9    Flowsheet Row Office Visit from 02/22/2023 in [facility] Visit from 02/19/2022 in [facility] [employer] [facility] Visit from 02/18/2021 in [facility] [employer] from 07/24/2020 in [facility] [employer] from 03/31/2020 in [facility]  PHQ-2 Total Score 1 0 0 0 0  PHQ-9 Total Score 3 0 -- 0 0      Flowsheet Row ED to Hosp-Admission (Discharged) from 01/22/2023 in [facility]  SURGICAL ED from 10/24/2021 in [facility] RISK CATEGORY No Risk No Risk        Review of Systems:  Review of Systems  Constitutional:  Tammy [name] reports that her hair has been shedding  Musculoskeletal:  Negative for gait problem.  Neurological:  Negative for tremors.  Psychiatric/Behavioral:         Please refer to HPI    Medications: I have reviewed the patient's current medications.  Current Outpatient Medications  Medication Sig Dispense Refill   ACETAMINOPHEN-CAFFEINE PO Take by mouth in the morning.     ARIPiprazole (ABILIFY) 2 MG  tablet Take 0.5-1 tablets (1-2 mg total) by mouth daily. 90 tablet 1   clonazePAM (KLONOPIN) 0.5 MG tablet Take 1 tablet (0.5 mg total) by mouth daily as needed. 30 tablet 0   FLUoxetine (PROZAC) 10 MG capsule Take 3 capsules (30 mg total) by mouth daily. 270 capsule 1   hydrochlorothiazide (HYDRODIURIL) 25 MG tablet Take 1 tablet (25 mg total) by mouth daily. 90 tablet 3   [START ON 09/01/2023] lisdexamfetamine (VYVANSE) 20 MG capsule Take 1 capsule (20 mg total) by mouth daily. 90 capsule 0   MAGNESIUM CHLORIDE-CALCIUM PO Take by mouth.     Melatonin 3 MG CAPS Take 6 mg by mouth at bedtime.     pantoprazole (PROTONIX) 20 MG tablet Take 1 tablet (20 mg total) by mouth daily. 90 tablet 3   Probiotic Product (PROBIOTIC DAILY PO) Take by mouth.     tacrolimus (PROTOPIC) 0.1 % ointment Apply as directed to affected area twice a day 30 g 11   topiramate (TOPAMAX) 25 MG tablet Take 1 tablet (25 mg total) by mouth daily. (Patient not taking: Reported on 06/20/2023) 30 tablet 0   No current facility-administered medications for this visit.    Medication Side Effects: None  Allergies:  Allergies  Allergen Reactions   Egg-Derived Products Other (See Comments)    Makes pt feel like burping up rotten eggs    Past Medical History:  Diagnosis Date   Arthritis    Chronic fatigue syndrome    Diverticulosis    External hemorrhoids    Hypertension    Internal hemorrhoids    Neuropathy    Spinal stenosis     Past Medical History, Surgical history, Social history, and Family history were reviewed and updated as appropriate.   Please see review of systems for further details on the patient's review from today.   Objective:   Physical Exam:  BP 121/77   Pulse 88   LMP 11/05/2007   Physical Exam Constitutional:      General: Tammy [name] is not in acute distress. Musculoskeletal:        General: No deformity.  Neurological:     Mental Status: Tammy [name] is alert and oriented to person, place, and time.      Coordination: Coordination normal.  Psychiatric:        Attention and Perception: Attention and perception normal. Tammy [name] does not perceive auditory or visual hallucinations.        Mood and Affect: Mood normal. Mood is not anxious or depressed. Affect is not labile, blunt, angry or inappropriate.        Speech: Speech normal.        Behavior: Behavior normal.        Thought Content: Thought content normal. Thought content is not paranoid or delusional. Thought content does not include homicidal or suicidal ideation. Thought content does not include homicidal or suicidal plan.        Cognition and Memory: Cognition and memory normal.        Judgment: Judgment normal.     Comments: Insight intact  Lab Review:     Component Value Date/Time   NA 139 02/22/2023 0850   K 3.9 02/22/2023 0850   CL 102 02/22/2023 0850   CO2 28 02/22/2023 0850   GLUCOSE 110 (H) 02/22/2023 0850   BUN 21 02/22/2023 0850   CREATININE 0.72 02/22/2023 0850   CREATININE 0.83 02/15/2020 1627   CALCIUM 9.5 02/22/2023 0850   PROT 6.7 02/22/2023 0850   ALBUMIN 4.1 02/22/2023 0850   AST 22 02/22/2023 0850   ALT 20 02/22/2023 0850   ALKPHOS 73 02/22/2023 0850   BILITOT 0.3 02/22/2023 0850   GFRNONAA >60 01/23/2023 0237   GFRAA >90 09/18/2012 0525       Component Value Date/Time   WBC 3.8 (L) 02/22/2023 0850   RBC 4.61 02/22/2023 0850   HGB 11.8 (L) 02/22/2023 0850   HCT 37.1 02/22/2023 0850   PLT 259.0 02/22/2023 0850   MCV 80.6 02/22/2023 0850   MCH 26.9 01/23/2023 0237   MCHC 31.7 02/22/2023 0850   RDW 13.4 02/22/2023 0850   LYMPHSABS 0.9 02/22/2023 0850   MONOABS 0.3 02/22/2023 0850   EOSABS 0.3 02/22/2023 0850   BASOSABS 0.0 02/22/2023 0850    No results found for: "POCLITH", "LITHIUM"   No results found for: "PHENYTOIN", "PHENOBARB", "VALPROATE", "CBMZ"   .res Assessment: Plan:    Will continue current plan of care since target signs and symptoms are well controlled without any  tolerability issues. Continue Abilify 1 mg daily for augmentation of depression. Discussed that Metformin is used off-label for antipsychotic induced weight gain. Discussed that this may be a possible treatment consideration in the future.  Continue Prozac 30 mg daily for anxiety and depression.  Continue Vyvanse 20 mg daily for ADHD.  Continue klonopin 0.5 mg daily as needed for anxiety.  Pt to follow-up in 6 months or sooner if clinically indicated.  Patient advised to contact office with any questions, adverse effects, or acute worsening in signs and symptoms.   Tammy [name] was seen today for follow-up.  Diagnoses and all orders for this visit:  Recurrent major depressive disorder, in full remission (HCC) -     ARIPiprazole (ABILIFY) 2 MG tablet; Take 0.5-1 tablets (1-2 mg total) by mouth daily. -     FLUoxetine (PROZAC) 10 MG capsule; Take 3 capsules (30 mg total) by mouth daily.  Anxiety -     clonazePAM (KLONOPIN) 0.5 MG tablet; Take 1 tablet (0.5 mg total) by mouth daily as needed. -     FLUoxetine (PROZAC) 10 MG capsule; Take 3 capsules (30 mg total) by mouth daily.  Attention deficit disorder (ADD) in adult -     lisdexamfetamine (VYVANSE) 20 MG capsule; Take 1 capsule (20 mg total) by mouth daily.     Please see After Visit Summary for patient specific instructions.  Future Appointments  Date Time Provider Department Center  12/15/2023  1:00 PM Corie Chiquito, PMHNP CP-CP None  02/28/2024  8:00 AM Shelva Majestic, MD [facility]    No orders of the defined types were placed in this encounter.   -------------------------------

## 2023-06-21 ENCOUNTER — Ambulatory Visit: Payer: 59 | Admitting: Physician Assistant

## 2023-06-22 DIAGNOSIS — F50811 Binge eating disorder, moderate: Secondary | ICD-10-CM | POA: Diagnosis not present

## 2023-06-22 DIAGNOSIS — Z8639 Personal history of other endocrine, nutritional and metabolic disease: Secondary | ICD-10-CM | POA: Diagnosis not present

## 2023-06-22 DIAGNOSIS — Z79899 Other long term (current) drug therapy: Secondary | ICD-10-CM | POA: Diagnosis not present

## 2023-06-22 DIAGNOSIS — Z6829 Body mass index (BMI) 29.0-29.9, adult: Secondary | ICD-10-CM | POA: Diagnosis not present

## 2023-06-22 DIAGNOSIS — E663 Overweight: Secondary | ICD-10-CM | POA: Diagnosis not present

## 2023-06-23 ENCOUNTER — Other Ambulatory Visit: Payer: Self-pay

## 2023-06-23 DIAGNOSIS — F332 Major depressive disorder, recurrent severe without psychotic features: Secondary | ICD-10-CM | POA: Diagnosis not present

## 2023-06-23 DIAGNOSIS — F411 Generalized anxiety disorder: Secondary | ICD-10-CM | POA: Diagnosis not present

## 2023-06-27 DIAGNOSIS — F332 Major depressive disorder, recurrent severe without psychotic features: Secondary | ICD-10-CM | POA: Diagnosis not present

## 2023-06-27 DIAGNOSIS — F411 Generalized anxiety disorder: Secondary | ICD-10-CM | POA: Diagnosis not present

## 2023-06-28 ENCOUNTER — Other Ambulatory Visit (HOSPITAL_COMMUNITY): Payer: Self-pay

## 2023-07-06 DIAGNOSIS — E663 Overweight: Secondary | ICD-10-CM | POA: Diagnosis not present

## 2023-07-06 DIAGNOSIS — F50811 Binge eating disorder, moderate: Secondary | ICD-10-CM | POA: Diagnosis not present

## 2023-07-06 DIAGNOSIS — F4310 Post-traumatic stress disorder, unspecified: Secondary | ICD-10-CM | POA: Diagnosis not present

## 2023-07-06 DIAGNOSIS — Z6829 Body mass index (BMI) 29.0-29.9, adult: Secondary | ICD-10-CM | POA: Diagnosis not present

## 2023-07-06 DIAGNOSIS — Z8639 Personal history of other endocrine, nutritional and metabolic disease: Secondary | ICD-10-CM | POA: Diagnosis not present

## 2023-07-06 DIAGNOSIS — R632 Polyphagia: Secondary | ICD-10-CM | POA: Diagnosis not present

## 2023-07-07 DIAGNOSIS — F411 Generalized anxiety disorder: Secondary | ICD-10-CM | POA: Diagnosis not present

## 2023-07-07 DIAGNOSIS — F332 Major depressive disorder, recurrent severe without psychotic features: Secondary | ICD-10-CM | POA: Diagnosis not present

## 2023-07-18 ENCOUNTER — Other Ambulatory Visit: Payer: Self-pay

## 2023-07-26 DIAGNOSIS — F332 Major depressive disorder, recurrent severe without psychotic features: Secondary | ICD-10-CM | POA: Diagnosis not present

## 2023-07-26 DIAGNOSIS — F411 Generalized anxiety disorder: Secondary | ICD-10-CM | POA: Diagnosis not present

## 2023-07-27 ENCOUNTER — Other Ambulatory Visit (HOSPITAL_COMMUNITY): Payer: Self-pay

## 2023-07-27 DIAGNOSIS — Z6830 Body mass index (BMI) 30.0-30.9, adult: Secondary | ICD-10-CM | POA: Diagnosis not present

## 2023-07-27 DIAGNOSIS — E66811 Obesity, class 1: Secondary | ICD-10-CM | POA: Diagnosis not present

## 2023-07-27 DIAGNOSIS — F4310 Post-traumatic stress disorder, unspecified: Secondary | ICD-10-CM | POA: Diagnosis not present

## 2023-07-27 DIAGNOSIS — F902 Attention-deficit hyperactivity disorder, combined type: Secondary | ICD-10-CM | POA: Diagnosis not present

## 2023-07-27 MED ORDER — LISDEXAMFETAMINE DIMESYLATE 10 MG PO CAPS
10.0000 mg | ORAL_CAPSULE | Freq: Every morning | ORAL | 0 refills | Status: DC
  Filled 2023-07-27: qty 30, 30d supply, fill #0

## 2023-07-28 ENCOUNTER — Other Ambulatory Visit (HOSPITAL_COMMUNITY): Payer: Self-pay

## 2023-07-29 ENCOUNTER — Other Ambulatory Visit: Payer: Self-pay

## 2023-08-03 ENCOUNTER — Encounter: Payer: Self-pay | Admitting: Psychiatry

## 2023-08-04 DIAGNOSIS — F411 Generalized anxiety disorder: Secondary | ICD-10-CM | POA: Diagnosis not present

## 2023-08-04 DIAGNOSIS — F332 Major depressive disorder, recurrent severe without psychotic features: Secondary | ICD-10-CM | POA: Diagnosis not present

## 2023-08-09 ENCOUNTER — Other Ambulatory Visit (HOSPITAL_COMMUNITY): Payer: Self-pay

## 2023-08-15 ENCOUNTER — Other Ambulatory Visit (HOSPITAL_COMMUNITY): Payer: Self-pay

## 2023-08-15 MED ORDER — LISDEXAMFETAMINE DIMESYLATE 10 MG PO CAPS
10.0000 mg | ORAL_CAPSULE | Freq: Every day | ORAL | 0 refills | Status: DC
  Filled 2023-09-16: qty 30, 30d supply, fill #0

## 2023-08-25 ENCOUNTER — Other Ambulatory Visit (HOSPITAL_COMMUNITY): Payer: Self-pay

## 2023-08-25 ENCOUNTER — Other Ambulatory Visit: Payer: Self-pay

## 2023-08-25 MED ORDER — LISDEXAMFETAMINE DIMESYLATE 20 MG PO CHEW
20.0000 mg | CHEWABLE_TABLET | Freq: Every morning | ORAL | 0 refills | Status: DC
  Filled 2023-08-25: qty 90, 90d supply, fill #0

## 2023-08-26 ENCOUNTER — Other Ambulatory Visit: Payer: Self-pay

## 2023-09-01 DIAGNOSIS — F332 Major depressive disorder, recurrent severe without psychotic features: Secondary | ICD-10-CM | POA: Diagnosis not present

## 2023-09-01 DIAGNOSIS — F411 Generalized anxiety disorder: Secondary | ICD-10-CM | POA: Diagnosis not present

## 2023-09-08 DIAGNOSIS — E66811 Obesity, class 1: Secondary | ICD-10-CM | POA: Diagnosis not present

## 2023-09-08 DIAGNOSIS — K219 Gastro-esophageal reflux disease without esophagitis: Secondary | ICD-10-CM | POA: Diagnosis not present

## 2023-09-08 DIAGNOSIS — F50811 Binge eating disorder, moderate: Secondary | ICD-10-CM | POA: Diagnosis not present

## 2023-09-08 DIAGNOSIS — Z6832 Body mass index (BMI) 32.0-32.9, adult: Secondary | ICD-10-CM | POA: Diagnosis not present

## 2023-09-08 DIAGNOSIS — F411 Generalized anxiety disorder: Secondary | ICD-10-CM | POA: Diagnosis not present

## 2023-09-16 ENCOUNTER — Other Ambulatory Visit: Payer: Self-pay

## 2023-09-19 ENCOUNTER — Telehealth: Payer: Self-pay | Admitting: Psychiatry

## 2023-09-19 ENCOUNTER — Other Ambulatory Visit (HOSPITAL_COMMUNITY): Payer: Self-pay

## 2023-09-19 ENCOUNTER — Other Ambulatory Visit: Payer: Self-pay

## 2023-09-19 NOTE — Telephone Encounter (Signed)
LVM per DPR with this info.

## 2023-09-19 NOTE — Telephone Encounter (Signed)
Patient needs refill for Vyvanse 20mg . Ph: [phone] she is scheduled with [facility]27 Pharmacy [facility] [address] [place], [place]

## 2023-09-19 NOTE — Telephone Encounter (Signed)
90-day supply filled on 12/5 by Helane Rima.

## 2023-09-27 ENCOUNTER — Other Ambulatory Visit (HOSPITAL_COMMUNITY): Payer: Self-pay

## 2023-09-27 IMAGING — DX DG FOOT COMPLETE 3+V*L*
3 series · 3 of 3 positions shown · non-contrast
Comparison: None.

CLINICAL DATA: LEFT foot pain following fall today. Initial
encounter.

EXAM:
LEFT FOOT - COMPLETE 3+ VIEW

[foot ap]
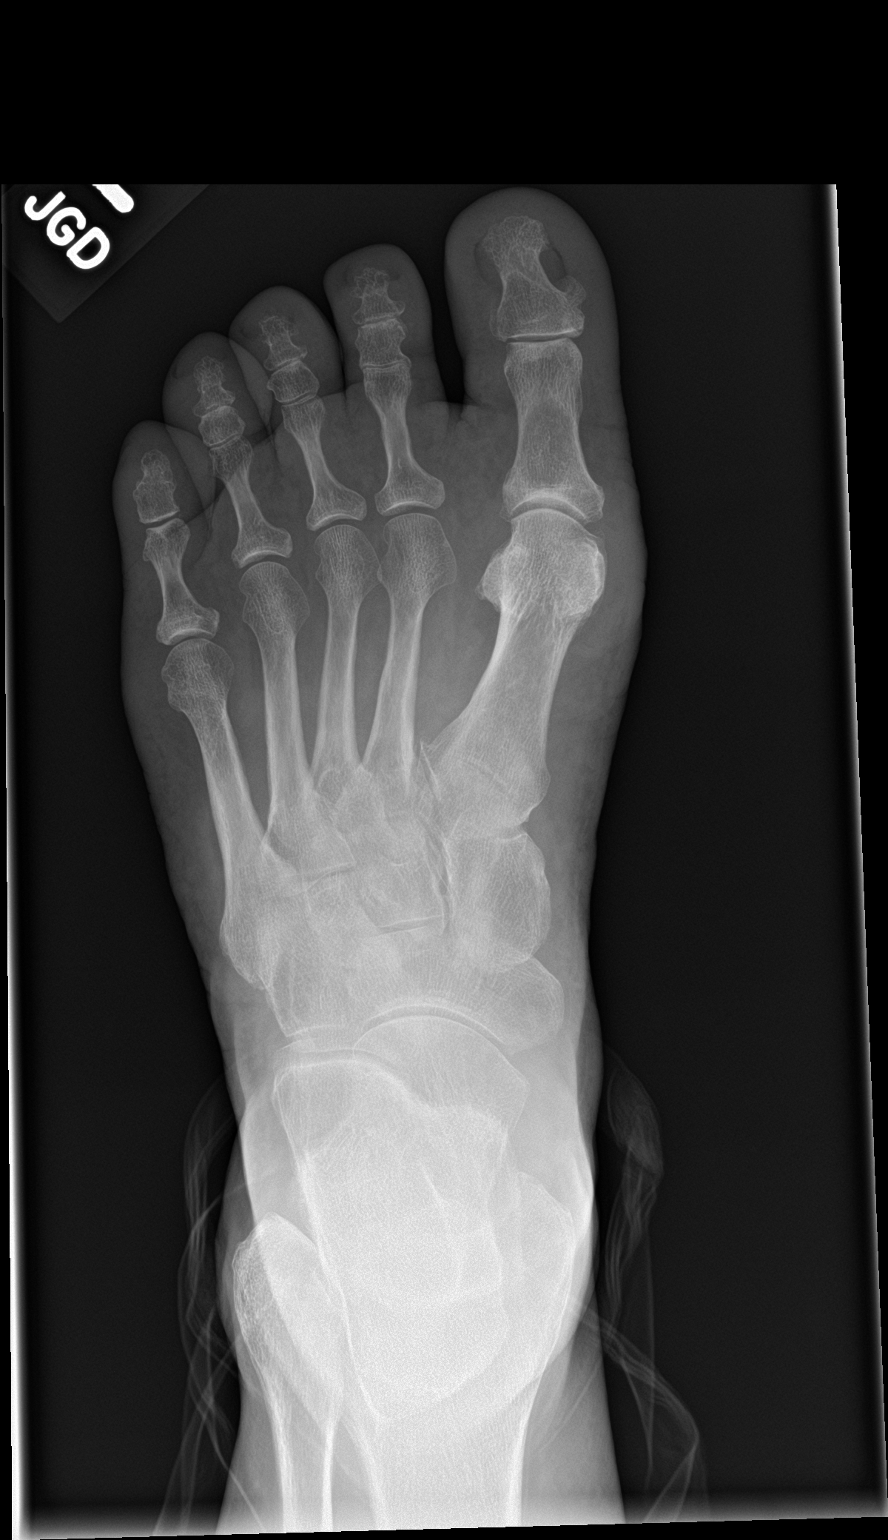

[foot obl]
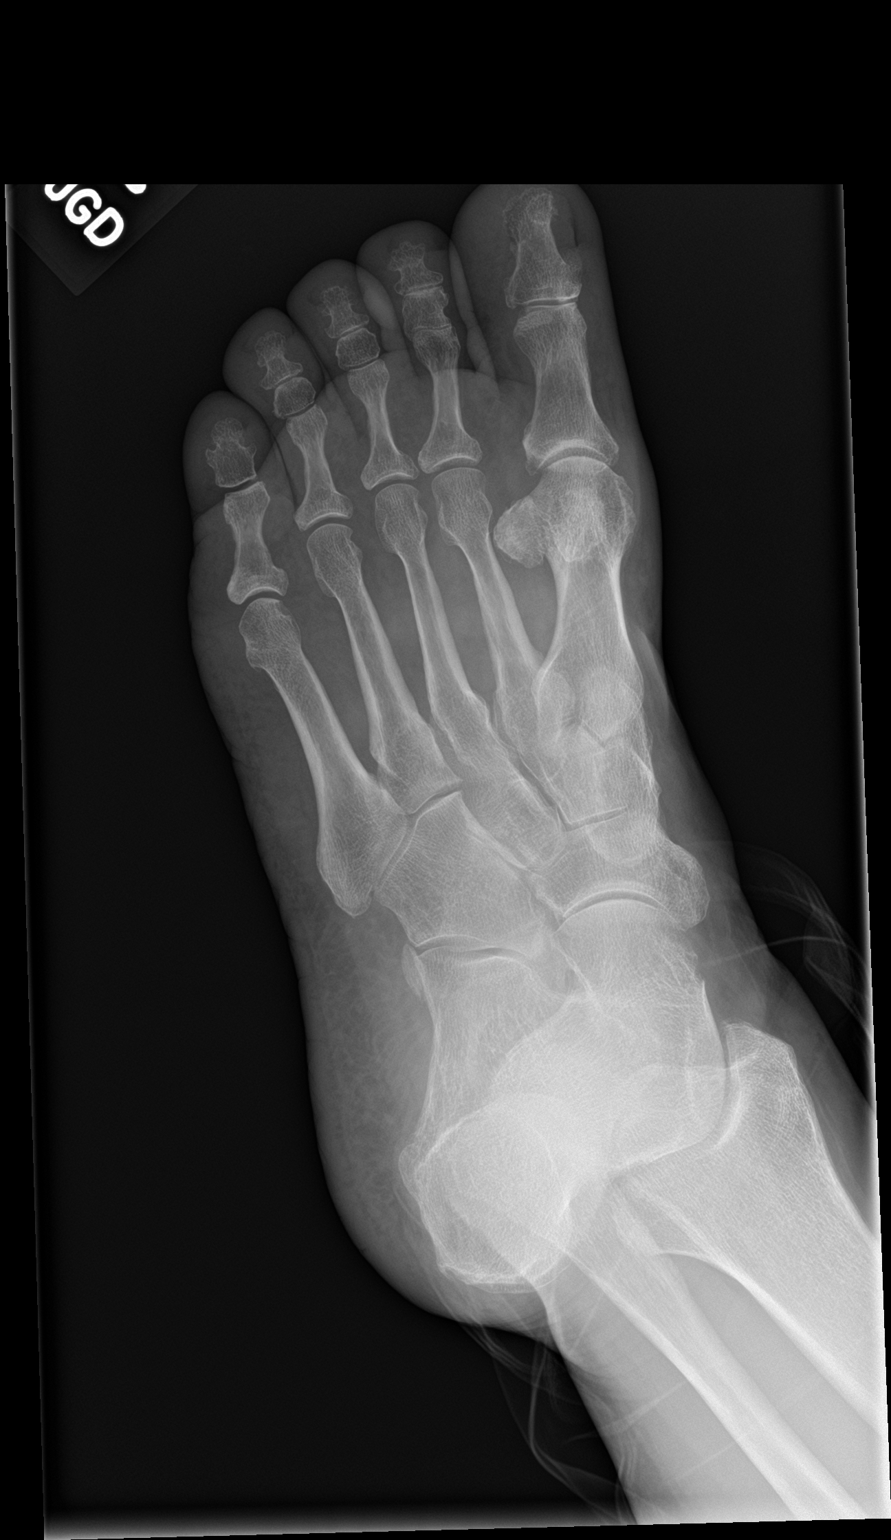

[foot lat]
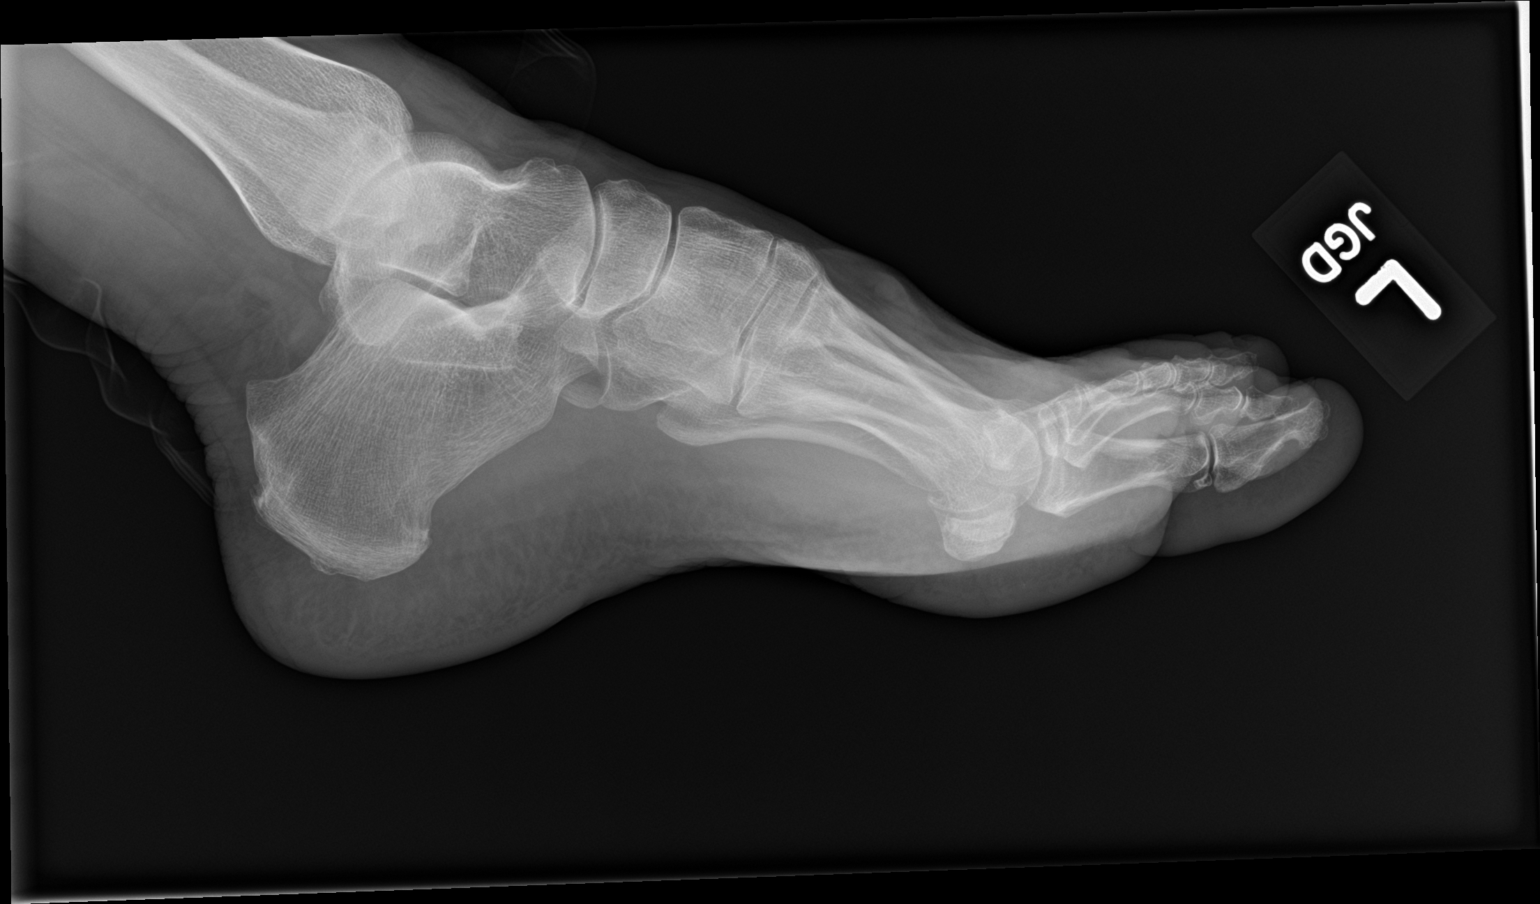

[3 of 3 positions shown; findings below may reference images not displayed]

FINDINGS: Offset at the calcaneocuboid joint noted on the lateral view as but
this appearance may be technical as the calcaneocuboid articulation
appears unremarkable on the other views.

No other fracture, subluxation or dislocation identified.

No focal bony lesions are present.
IMPRESSION: Offset at the calcaneocuboid joint on the lateral view, most likely
technical as the calcaneocuboid articulation appears unremarkable on
the other views. Correlate with pain.

## 2023-09-27 MED ORDER — AZITHROMYCIN 250 MG PO TABS
ORAL_TABLET | ORAL | 0 refills | Status: DC
  Filled 2023-09-27: qty 6, 5d supply, fill #0

## 2023-09-28 ENCOUNTER — Other Ambulatory Visit (HOSPITAL_COMMUNITY): Payer: Self-pay

## 2023-09-29 DIAGNOSIS — F332 Major depressive disorder, recurrent severe without psychotic features: Secondary | ICD-10-CM | POA: Diagnosis not present

## 2023-09-29 DIAGNOSIS — F411 Generalized anxiety disorder: Secondary | ICD-10-CM | POA: Diagnosis not present

## 2023-10-05 ENCOUNTER — Other Ambulatory Visit (HOSPITAL_COMMUNITY): Payer: Self-pay

## 2023-10-05 ENCOUNTER — Other Ambulatory Visit: Payer: Self-pay

## 2023-10-05 DIAGNOSIS — Z6832 Body mass index (BMI) 32.0-32.9, adult: Secondary | ICD-10-CM | POA: Diagnosis not present

## 2023-10-05 DIAGNOSIS — F411 Generalized anxiety disorder: Secondary | ICD-10-CM | POA: Diagnosis not present

## 2023-10-05 DIAGNOSIS — R632 Polyphagia: Secondary | ICD-10-CM | POA: Diagnosis not present

## 2023-10-05 DIAGNOSIS — E66811 Obesity, class 1: Secondary | ICD-10-CM | POA: Diagnosis not present

## 2023-10-05 MED ORDER — LISDEXAMFETAMINE DIMESYLATE 20 MG PO CAPS
20.0000 mg | ORAL_CAPSULE | Freq: Every morning | ORAL | 0 refills | Status: AC
  Filled 2023-10-05: qty 90, 90d supply, fill #0

## 2023-10-05 MED ORDER — LISDEXAMFETAMINE DIMESYLATE 10 MG PO CAPS
10.0000 mg | ORAL_CAPSULE | Freq: Every day | ORAL | 0 refills | Status: DC
  Filled 2023-10-05: qty 180, 90d supply, fill #0
  Filled 2023-11-15: qty 90, 90d supply, fill #0
  Filled 2023-11-15: qty 180, 90d supply, fill #0

## 2023-10-06 ENCOUNTER — Other Ambulatory Visit (HOSPITAL_COMMUNITY): Payer: Self-pay

## 2023-10-13 DIAGNOSIS — F332 Major depressive disorder, recurrent severe without psychotic features: Secondary | ICD-10-CM | POA: Diagnosis not present

## 2023-10-18 ENCOUNTER — Other Ambulatory Visit (HOSPITAL_COMMUNITY): Payer: Self-pay

## 2023-10-24 ENCOUNTER — Other Ambulatory Visit (HOSPITAL_COMMUNITY): Payer: Self-pay

## 2023-10-24 MED ORDER — LISDEXAMFETAMINE DIMESYLATE 10 MG PO CAPS
10.0000 mg | ORAL_CAPSULE | Freq: Every day | ORAL | 0 refills | Status: DC
  Filled 2023-10-24 – 2023-10-28 (×2): qty 60, 30d supply, fill #0

## 2023-10-24 MED ORDER — LISDEXAMFETAMINE DIMESYLATE 20 MG PO CAPS: 20.0000 mg | ORAL_CAPSULE | Freq: Every morning | ORAL | 0 refills | Status: DC

## 2023-10-27 DIAGNOSIS — F332 Major depressive disorder, recurrent severe without psychotic features: Secondary | ICD-10-CM | POA: Diagnosis not present

## 2023-10-28 ENCOUNTER — Other Ambulatory Visit (HOSPITAL_COMMUNITY): Payer: Self-pay

## 2023-11-07 ENCOUNTER — Other Ambulatory Visit (HOSPITAL_COMMUNITY): Payer: Self-pay

## 2023-11-10 DIAGNOSIS — F332 Major depressive disorder, recurrent severe without psychotic features: Secondary | ICD-10-CM | POA: Diagnosis not present

## 2023-11-15 ENCOUNTER — Other Ambulatory Visit (HOSPITAL_BASED_OUTPATIENT_CLINIC_OR_DEPARTMENT_OTHER): Payer: Self-pay

## 2023-11-15 ENCOUNTER — Other Ambulatory Visit: Payer: Self-pay

## 2023-11-15 ENCOUNTER — Other Ambulatory Visit (HOSPITAL_COMMUNITY): Payer: Self-pay

## 2023-11-17 ENCOUNTER — Other Ambulatory Visit (HOSPITAL_COMMUNITY): Payer: Self-pay

## 2023-11-17 ENCOUNTER — Other Ambulatory Visit: Payer: Self-pay

## 2023-11-17 DIAGNOSIS — F50811 Binge eating disorder, moderate: Secondary | ICD-10-CM | POA: Diagnosis not present

## 2023-11-17 DIAGNOSIS — Z6832 Body mass index (BMI) 32.0-32.9, adult: Secondary | ICD-10-CM | POA: Diagnosis not present

## 2023-11-17 DIAGNOSIS — R7303 Prediabetes: Secondary | ICD-10-CM | POA: Diagnosis not present

## 2023-11-17 DIAGNOSIS — E66811 Obesity, class 1: Secondary | ICD-10-CM | POA: Diagnosis not present

## 2023-11-17 MED ORDER — METFORMIN HCL ER 500 MG PO TB24
500.0000 mg | ORAL_TABLET | Freq: Two times a day (BID) | ORAL | 1 refills | Status: DC
  Filled 2023-11-17: qty 30, 15d supply, fill #0
  Filled 2023-11-28 – 2023-11-29 (×2): qty 30, 15d supply, fill #1

## 2023-11-18 ENCOUNTER — Other Ambulatory Visit: Payer: Self-pay

## 2023-11-18 ENCOUNTER — Other Ambulatory Visit (HOSPITAL_COMMUNITY): Payer: Self-pay

## 2023-11-18 ENCOUNTER — Encounter: Payer: Self-pay | Admitting: Family Medicine

## 2023-11-18 DIAGNOSIS — M25552 Pain in left hip: Secondary | ICD-10-CM

## 2023-11-18 DIAGNOSIS — M1612 Unilateral primary osteoarthritis, left hip: Secondary | ICD-10-CM

## 2023-11-18 DIAGNOSIS — M7062 Trochanteric bursitis, left hip: Secondary | ICD-10-CM

## 2023-11-18 NOTE — Telephone Encounter (Signed)
 Ordered

## 2023-11-18 NOTE — Progress Notes (Signed)
 Epidural ordered per Dr. Katrinka Blazing.

## 2023-11-22 ENCOUNTER — Encounter: Payer: Self-pay | Admitting: Family Medicine

## 2023-11-24 NOTE — Discharge Instructions (Signed)

## 2023-11-25 ENCOUNTER — Ambulatory Visit
Admission: RE | Admit: 2023-11-25 | Discharge: 2023-11-25 | Disposition: A | Discharge status: Home / Self Care | Source: Ambulatory Visit | Attending: Family Medicine | Admitting: Family Medicine

## 2023-11-25 DIAGNOSIS — M25552 Pain in left hip: Secondary | ICD-10-CM

## 2023-11-25 DIAGNOSIS — M7062 Trochanteric bursitis, left hip: Secondary | ICD-10-CM

## 2023-11-25 DIAGNOSIS — M4727 Other spondylosis with radiculopathy, lumbosacral region: Secondary | ICD-10-CM | POA: Diagnosis not present

## 2023-11-25 DIAGNOSIS — M1612 Unilateral primary osteoarthritis, left hip: Secondary | ICD-10-CM

## 2023-11-25 MED ORDER — METHYLPREDNISOLONE ACETATE 40 MG/ML INJ SUSP (RADIOLOG
80.0000 mg | Freq: Once | INTRAMUSCULAR | Status: AC
  Administered 2023-11-25: 80 mg via EPIDURAL

## 2023-11-25 MED ORDER — IOPAMIDOL (ISOVUE-M 200) INJECTION 41%
1.0000 mL | Freq: Once | INTRAMUSCULAR | Status: AC
  Administered 2023-11-25: 1 mL via EPIDURAL

## 2023-11-28 ENCOUNTER — Other Ambulatory Visit (HOSPITAL_COMMUNITY): Payer: Self-pay

## 2023-12-08 ENCOUNTER — Other Ambulatory Visit: Payer: Self-pay

## 2023-12-08 ENCOUNTER — Other Ambulatory Visit (HOSPITAL_COMMUNITY): Payer: Self-pay

## 2023-12-08 DIAGNOSIS — F332 Major depressive disorder, recurrent severe without psychotic features: Secondary | ICD-10-CM | POA: Diagnosis not present

## 2023-12-08 DIAGNOSIS — E6609 Other obesity due to excess calories: Secondary | ICD-10-CM | POA: Diagnosis not present

## 2023-12-08 DIAGNOSIS — R7303 Prediabetes: Secondary | ICD-10-CM | POA: Diagnosis not present

## 2023-12-08 DIAGNOSIS — E66811 Obesity, class 1: Secondary | ICD-10-CM | POA: Diagnosis not present

## 2023-12-08 DIAGNOSIS — E782 Mixed hyperlipidemia: Secondary | ICD-10-CM | POA: Diagnosis not present

## 2023-12-08 DIAGNOSIS — F50811 Binge eating disorder, moderate: Secondary | ICD-10-CM | POA: Diagnosis not present

## 2023-12-08 DIAGNOSIS — Z6831 Body mass index (BMI) 31.0-31.9, adult: Secondary | ICD-10-CM | POA: Diagnosis not present

## 2023-12-08 MED ORDER — LISDEXAMFETAMINE DIMESYLATE 20 MG PO CAPS
20.0000 mg | ORAL_CAPSULE | Freq: Every morning | ORAL | 0 refills | Status: DC
  Filled 2023-12-08: qty 30, 30d supply, fill #0

## 2023-12-08 MED ORDER — LISDEXAMFETAMINE DIMESYLATE 20 MG PO CAPS
20.0000 mg | ORAL_CAPSULE | Freq: Every day | ORAL | 0 refills | Status: DC
Start: 2024-01-02 — End: 2023-12-28

## 2023-12-15 ENCOUNTER — Ambulatory Visit: Payer: 59 | Admitting: Behavioral Health

## 2023-12-15 ENCOUNTER — Encounter: Payer: Self-pay | Admitting: Behavioral Health

## 2023-12-15 ENCOUNTER — Ambulatory Visit: Payer: 59 | Admitting: Psychiatry

## 2023-12-15 ENCOUNTER — Other Ambulatory Visit (HOSPITAL_COMMUNITY): Payer: Self-pay

## 2023-12-15 VITALS — Wt 178.0 lb

## 2023-12-15 DIAGNOSIS — F988 Other specified behavioral and emotional disorders with onset usually occurring in childhood and adolescence: Secondary | ICD-10-CM

## 2023-12-15 DIAGNOSIS — F419 Anxiety disorder, unspecified: Secondary | ICD-10-CM

## 2023-12-15 DIAGNOSIS — F3342 Major depressive disorder, recurrent, in full remission: Secondary | ICD-10-CM

## 2023-12-15 IMAGING — DX DG HIP (WITH OR WITHOUT PELVIS) 2-3V*L*
3 series · 3 of 3 positions shown · non-contrast
Comparison: None.

CLINICAL DATA: Left hip pain for a few months.  No known injury.

EXAM:
DG HIP (WITH OR WITHOUT PELVIS) 2-3V LEFT

[pelvis ap]
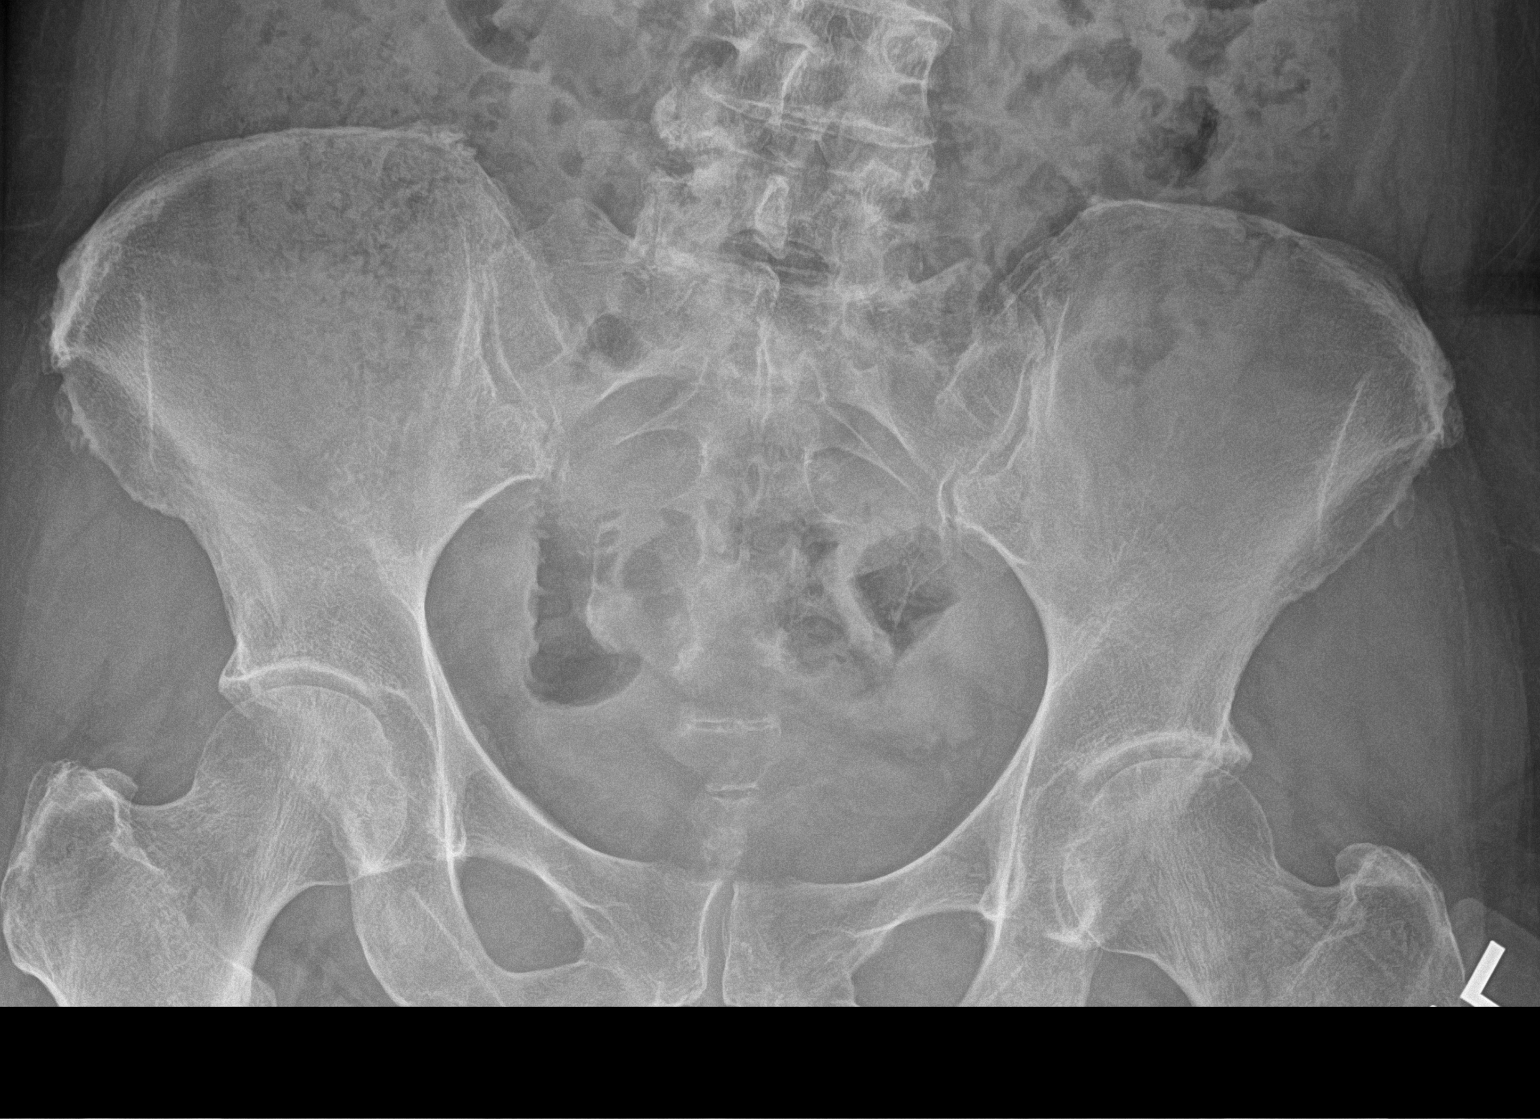

[hip ap]
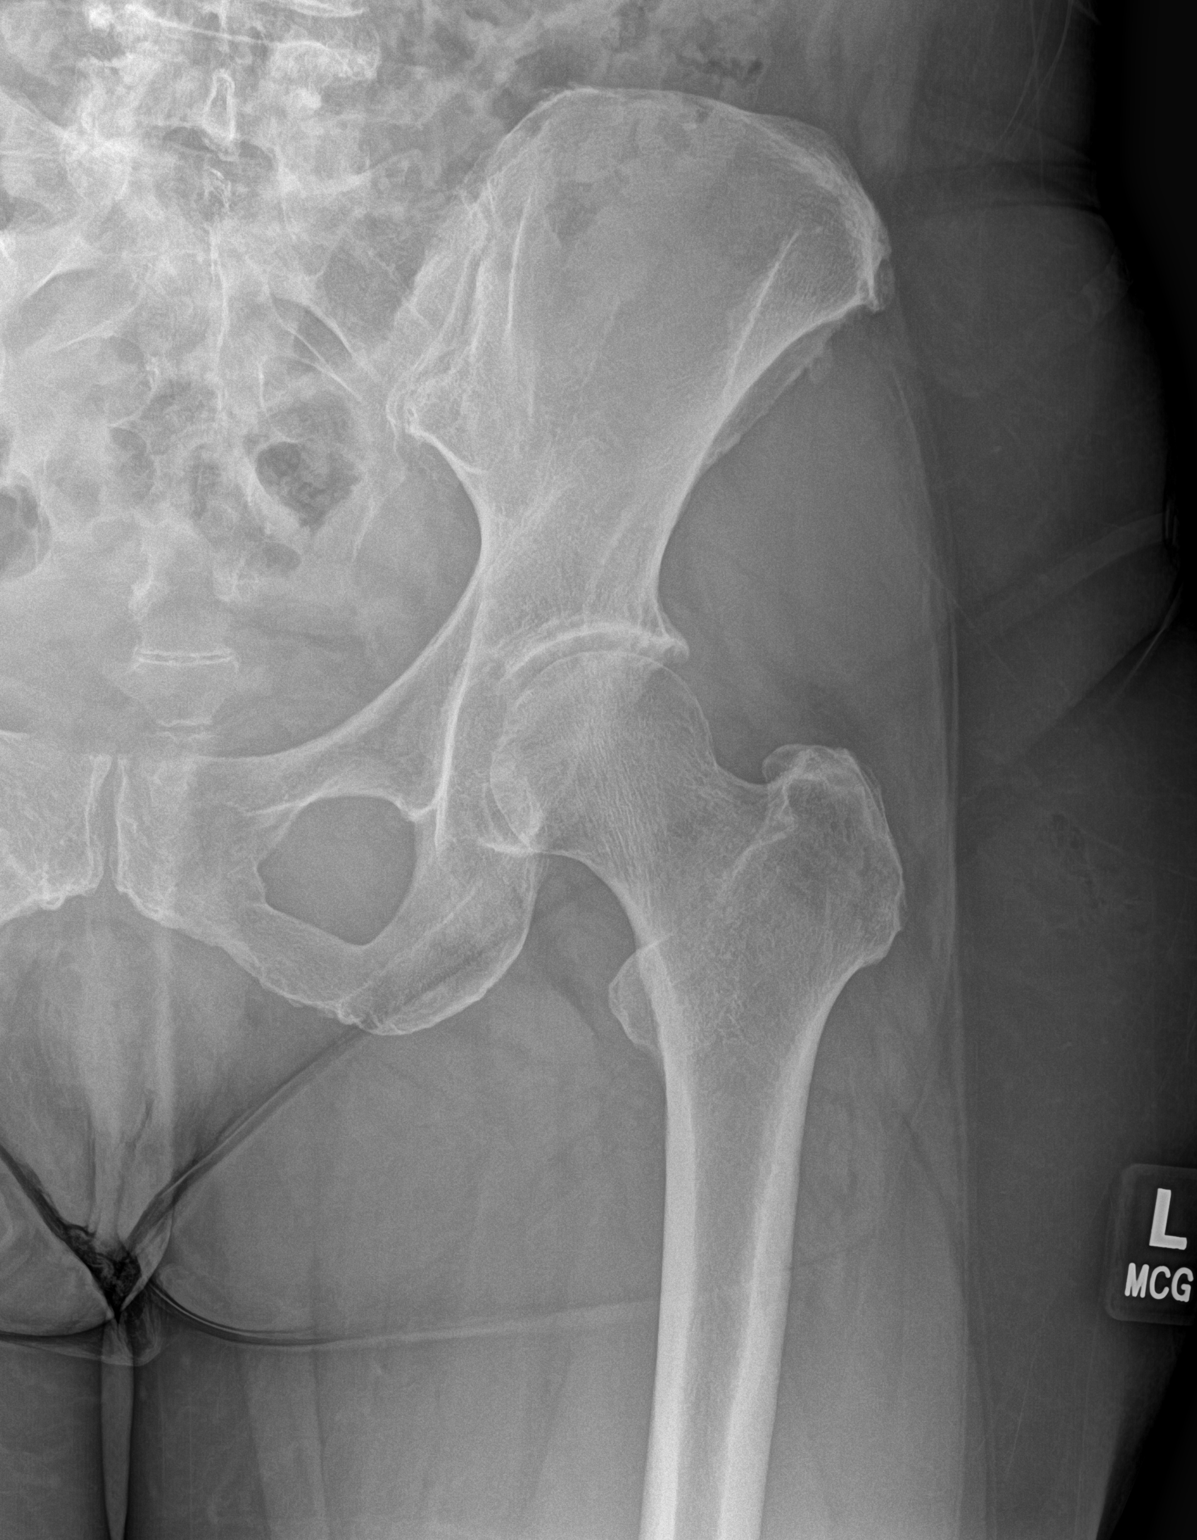

[hip frog leg]
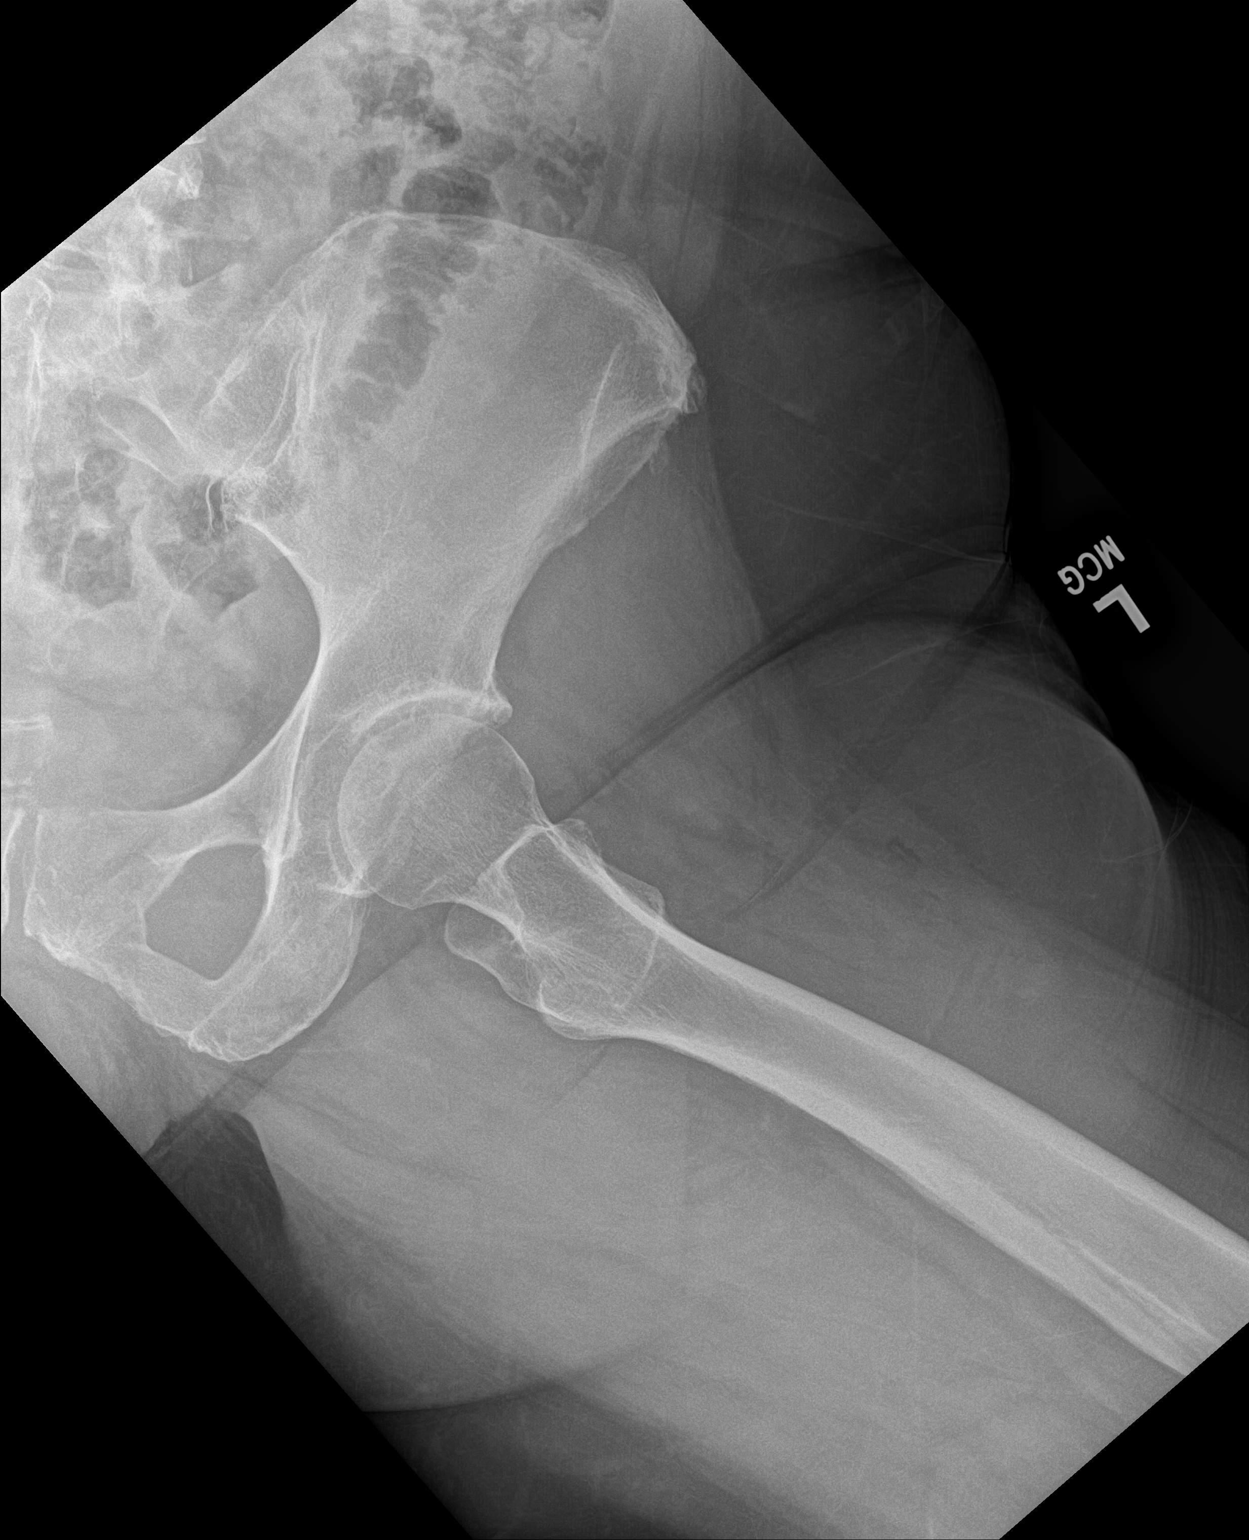

[3 of 3 positions shown; findings below may reference images not displayed]

FINDINGS: Mildly decreased bone mineralization. Minimal superior left
femoroacetabular joint space narrowing. Mild superolateral left
acetabular degenerative osteophytosis. No significant degenerative
change within the right hip, pubic symphysis, or sacroiliac joints.
No acute fracture or dislocation.
IMPRESSION: Mild left femoroacetabular osteoarthritis.

## 2023-12-15 MED ORDER — ARIPIPRAZOLE 2 MG PO TABS
1.0000 mg | ORAL_TABLET | Freq: Every day | ORAL | 1 refills | Status: DC
  Filled 2023-12-15: qty 90, 90d supply, fill #0

## 2023-12-15 MED ORDER — FLUOXETINE HCL 10 MG PO CAPS
30.0000 mg | ORAL_CAPSULE | Freq: Every day | ORAL | 1 refills | Status: DC
Start: 2023-12-15 — End: 2024-03-13
  Filled 2023-12-15: qty 270, 90d supply, fill #0

## 2023-12-15 NOTE — Progress Notes (Signed)
 [facility]  Patient ID: Tammy [name],  MRN: [id number]  PCP: Shelva Majestic, MD  Date of Evaluation: 12/15/2023 Time spent:30 minutes  Chief Complaint:  Chief Complaint   Anxiety; Depression; Follow-up; Medication Refill; Patient Education; ADHD     HISTORY/CURRENT STATUS: HPI  Tammy [name] presents to the office today for follow-up of anxiety, depression, and ADHD.  She is a previous patient of Corie Chiquito.  Collateral information should be considered reliable she reports that overall her anxiety has been ok.  States that she has been very stable with her current medications for the last 4 years.  Says that she is needing a new provider to manage her medications.  States that she would also like me to take over the management of her Adderall.  She is requesting no adjustment or changes to her current medications at this time.  Denies history of mania, no psychosis, no auditory or visual hallucinations.  Denies SI.  Requesting 42-month follow-up.   Past Psychiatric Medication Trials: Cymbalta Zoloft- Interfered with concentration and thinking was not clear Lexapro- Nocturnal panic attacks.   Prozac- Was on 40 mg of Prozac at the time she was having akathisia with Abilify 2 mg. Higher doses seemed to exacerbate akathisia Abilify- helpful. Started on 2 mg daily. Had akathisia and twitching at 5 mg Rexulti- Had very low motivation and fatigue Lamictal- rash Klonopin- Takes as needed. Did not help with rumination. Ambien Vyvanse- took 20 mg in the past for ADHD. Re-started 10 mg dose. Remeron- Gained 20 lbs. Gabapentin- Given for neuropathy and caused cloudy thoughts.  Lamotrigine- tingling/burning around mouth and vagina. No rash or blisters.  May have been helpful for mood. Topamax   Had [facility] testing that indicated adverse effects to Zoloft, Lexapro, and Paxil.    Individual Medical History/ Review of Systems: Changes? :No   Allergies: Egg-derived  products  Current Medications:  Current Outpatient Medications:    ACETAMINOPHEN-CAFFEINE PO, Take by mouth in the morning., Disp: , Rfl:    ARIPiprazole (ABILIFY) 2 MG tablet, Take 0.5-1 tablets (1-2 mg total) by mouth daily., Disp: 90 tablet, Rfl: 1   azithromycin (ZITHROMAX) 250 MG tablet, Take 2 tablets by mouth on day 1.  Then take 1 tablet by mouth daily on days 2 to 5., Disp: 6 tablet, Rfl: 0   clonazePAM (KLONOPIN) 0.5 MG tablet, Take 1 tablet (0.5 mg total) by mouth daily as needed., Disp: 30 tablet, Rfl: 0   FLUoxetine (PROZAC) 10 MG capsule, Take 3 capsules (30 mg total) by mouth daily., Disp: 270 capsule, Rfl: 1   hydrochlorothiazide (HYDRODIURIL) 25 MG tablet, Take 1 tablet (25 mg total) by mouth daily., Disp: 90 tablet, Rfl: 3   lisdexamfetamine (VYVANSE) 10 MG capsule, Take 1-2 capsules (10-20 mg total) by mouth daily in the afternoon., Disp: 180 capsule, Rfl: 0   lisdexamfetamine (VYVANSE) 20 MG capsule, Take 1 capsule (20 mg total) by mouth daily., Disp: 90 capsule, Rfl: 0   lisdexamfetamine (VYVANSE) 20 MG capsule, Take 1 capsule (20 mg total) by mouth in the morning., Disp: 90 capsule, Rfl: 0   [START ON 01/02/2024] lisdexamfetamine (VYVANSE) 20 MG capsule, Take 1 capsule (20 mg total) by mouth daily., Disp: 90 capsule, Rfl: 0   Lisdexamfetamine Dimesylate 20 MG CHEW, Chew 1 tablet (20 mg total) by mouth in the morning., Disp: 90 tablet, Rfl: 0   MAGNESIUM CHLORIDE-CALCIUM PO, Take by mouth., Disp: , Rfl:    Melatonin 3 MG CAPS, Take 6  mg by mouth at bedtime., Disp: , Rfl:    metFORMIN (GLUCOPHAGE-XR) 500 MG 24 hr tablet, Take 1 tablet (500 mg total) by mouth 2 (two) times daily with evening meal., Disp: 30 tablet, Rfl: 1   pantoprazole (PROTONIX) 20 MG tablet, Take 1 tablet (20 mg total) by mouth daily., Disp: 90 tablet, Rfl: 3   Probiotic Product (PROBIOTIC DAILY PO), Take by mouth., Disp: , Rfl:    tacrolimus (PROTOPIC) 0.1 % ointment, Apply as directed to affected area twice  a day, Disp: 30 g, Rfl: 11   topiramate (TOPAMAX) 25 MG tablet, Take 1 tablet (25 mg total) by mouth daily. (Patient not taking: Reported on 06/20/2023), Disp: 30 tablet, Rfl: 0 Medication Side Effects: none  Family Medical/ Social History: Changes? No  MENTAL HEALTH EXAM:  Weight 178 lb (80.7 kg), last menstrual period 11/05/2007.Body mass index is 33.63 kg/m.  General Appearance: Casual, Neat, and Well Groomed  Eye Contact:  Good  Speech:  Clear and Coherent  Volume:  Normal  Mood:  NA  Affect:  Appropriate  Thought Process:  Coherent  Orientation:  Full (Time, Place, and Person)  Thought Content: Logical   Suicidal Thoughts:  No  Homicidal Thoughts:  No  Memory:  WNL  Judgement:  Good  Insight:  Good  Psychomotor Activity:  Normal  Concentration:  Concentration: Good  Recall:  Good  Fund of Knowledge: Good  Language: Good  Assets:  Desire for Improvement  ADL's:  Intact  Cognition: WNL  Prognosis:  Good    DIAGNOSES:    ICD-10-CM   1. Attention deficit disorder (ADD) in adult  F98.8     2. Recurrent major depressive disorder, in full remission (HCC)  F33.42 FLUoxetine (PROZAC) 10 MG capsule    ARIPiprazole (ABILIFY) 2 MG tablet    3. Anxiety  F41.9 FLUoxetine (PROZAC) 10 MG capsule      Receiving Psychotherapy: No    RECOMMENDATIONS:   Greater than 50% of  30 min face to face time with patient was spent on counseling and coordination of care. We discussed her long-term care with Corie Chiquito, NP.  Reviewed her history and medications. We agreed to:  Will continue current plan of care since target signs and symptoms are well controlled without any tolerability issues. Continue Abilify 1 mg daily for augmentation of depression. Discussed that Metformin is used off-label for antipsychotic induced weight gain. Discussed that this may be a possible treatment consideration in the future.  Continue Prozac 30 mg daily for anxiety and depression.  Continue Vyvanse  20 mg daily for ADHD.  Continue klonopin 0.5 mg daily as needed for anxiety.  Provided emergency contact information Pt to follow-up in 6 months or sooner if clinically indicated.  Patient advised to contact office with any questions, adverse effects, or acute worsening in signs and symptoms. Reviewed PDMP          Joan Flores, NP

## 2023-12-22 ENCOUNTER — Other Ambulatory Visit (HOSPITAL_COMMUNITY): Payer: Self-pay

## 2023-12-22 ENCOUNTER — Other Ambulatory Visit: Payer: Self-pay

## 2023-12-22 ENCOUNTER — Ambulatory Visit: Payer: 59 | Admitting: Family Medicine

## 2023-12-22 DIAGNOSIS — R635 Abnormal weight gain: Secondary | ICD-10-CM | POA: Diagnosis not present

## 2023-12-22 DIAGNOSIS — F332 Major depressive disorder, recurrent severe without psychotic features: Secondary | ICD-10-CM | POA: Diagnosis not present

## 2023-12-22 DIAGNOSIS — Z79899 Other long term (current) drug therapy: Secondary | ICD-10-CM | POA: Diagnosis not present

## 2023-12-22 DIAGNOSIS — M25551 Pain in right hip: Secondary | ICD-10-CM | POA: Diagnosis not present

## 2023-12-22 DIAGNOSIS — E782 Mixed hyperlipidemia: Secondary | ICD-10-CM | POA: Diagnosis not present

## 2023-12-22 DIAGNOSIS — R7303 Prediabetes: Secondary | ICD-10-CM | POA: Diagnosis not present

## 2023-12-22 MED ORDER — METFORMIN HCL ER 500 MG PO TB24
ORAL_TABLET | ORAL | 3 refills | Status: DC
  Filled 2023-12-22: qty 60, 30d supply, fill #0

## 2023-12-22 MED ORDER — METFORMIN HCL ER 500 MG PO TB24
500.0000 mg | ORAL_TABLET | Freq: Every day | ORAL | 3 refills | Status: DC
Start: 2023-12-22 — End: 2024-06-12
  Filled 2023-12-22: qty 60, 30d supply, fill #0

## 2023-12-28 ENCOUNTER — Encounter: Payer: Self-pay | Admitting: Family Medicine

## 2023-12-28 ENCOUNTER — Ambulatory Visit (INDEPENDENT_AMBULATORY_CARE_PROVIDER_SITE_OTHER): Admitting: Family Medicine

## 2023-12-28 VITALS — BP 110/72 | HR 75 | Temp 97.2°F | Ht 61.0 in | Wt 176.8 lb

## 2023-12-28 DIAGNOSIS — F325 Major depressive disorder, single episode, in full remission: Secondary | ICD-10-CM | POA: Diagnosis not present

## 2023-12-28 DIAGNOSIS — D509 Iron deficiency anemia, unspecified: Secondary | ICD-10-CM

## 2023-12-28 DIAGNOSIS — E785 Hyperlipidemia, unspecified: Secondary | ICD-10-CM | POA: Diagnosis not present

## 2023-12-28 DIAGNOSIS — R7303 Prediabetes: Secondary | ICD-10-CM | POA: Diagnosis not present

## 2023-12-28 DIAGNOSIS — I10 Essential (primary) hypertension: Secondary | ICD-10-CM | POA: Diagnosis not present

## 2023-12-28 DIAGNOSIS — Z131 Encounter for screening for diabetes mellitus: Secondary | ICD-10-CM | POA: Diagnosis not present

## 2023-12-28 DIAGNOSIS — Z79899 Other long term (current) drug therapy: Secondary | ICD-10-CM

## 2023-12-28 DIAGNOSIS — E669 Obesity, unspecified: Secondary | ICD-10-CM

## 2023-12-28 LAB — COMPREHENSIVE METABOLIC PANEL WITH GFR
ALT: 12 U/L (ref 0–35)
AST: 16 U/L (ref 0–37)
Albumin: 4.4 g/dL (ref 3.5–5.2)
Alkaline Phosphatase: 56 U/L (ref 39–117)
BUN: 13 mg/dL (ref 6–23)
CO2: 29 meq/L (ref 19–32)
Calcium: 9.4 mg/dL (ref 8.4–10.5)
Chloride: 102 meq/L (ref 96–112)
Creatinine, Ser: 0.66 mg/dL (ref 0.40–1.20)
GFR: 92.5 mL/min (ref >60.00)
Glucose, Bld: 94 mg/dL (ref 70–99)
Potassium: 3.6 meq/L (ref 3.5–5.1)
Sodium: 139 meq/L (ref 135–145)
Total Bilirubin: 0.3 mg/dL (ref 0.2–1.2)
Total Protein: 6.8 g/dL (ref 6.0–8.3)

## 2023-12-28 LAB — CBC WITH DIFFERENTIAL/PLATELET
Basophils Absolute: 0 10*3/uL (ref 0.0–0.1)
Basophils Relative: 0.9 % (ref 0.0–3.0)
Eosinophils Absolute: 0.3 10*3/uL (ref 0.0–0.7)
Eosinophils Relative: 9.6 % — ABNORMAL HIGH (ref 0.0–5.0)
HCT: 32.4 % — ABNORMAL LOW (ref 36.0–46.0)
Hemoglobin: 10.3 g/dL — ABNORMAL LOW (ref 12.0–15.0)
Lymphocytes Relative: 30.3 % (ref 12.0–46.0)
Lymphs Abs: 0.9 10*3/uL (ref 0.7–4.0)
MCHC: 31.7 g/dL (ref 30.0–36.0)
MCV: 69.8 fl — ABNORMAL LOW (ref 78.0–100.0)
Monocytes Absolute: 0.3 10*3/uL (ref 0.1–1.0)
Monocytes Relative: 8.5 % (ref 3.0–12.0)
Neutro Abs: 1.5 10*3/uL (ref 1.4–7.7)
Neutrophils Relative %: 50.7 % (ref 43.0–77.0)
Platelets: 227 10*3/uL (ref 150.0–400.0)
RBC: 4.65 Mil/uL (ref 3.87–5.11)
RDW: 17.7 % — ABNORMAL HIGH (ref 11.5–15.5)
WBC: 2.9 10*3/uL — ABNORMAL LOW (ref 4.0–10.5)

## 2023-12-28 LAB — IBC + FERRITIN
Ferritin: 4.3 ng/mL — ABNORMAL LOW (ref 10.0–291.0)
Iron: 28 ug/dL — ABNORMAL LOW (ref 42–145)
Saturation Ratios: 5.3 % — ABNORMAL LOW (ref 20.0–50.0)
TIBC: 523.6 ug/dL — ABNORMAL HIGH (ref 250.0–450.0)
Transferrin: 374 mg/dL — ABNORMAL HIGH (ref 212.0–360.0)

## 2023-12-28 LAB — HEMOGLOBIN A1C: Hgb A1c MFr Bld: 6.4 % (ref 4.6–6.5)

## 2023-12-28 LAB — VITAMIN B12: Vitamin B-12: 272 pg/mL (ref 211–911)

## 2023-12-28 LAB — TSH: TSH: 1.77 u[IU]/mL (ref 0.35–5.50)

## 2023-12-28 NOTE — Patient Instructions (Addendum)
 Health Maintenance Due  Topic Date Due   DEXA SCAN  12/09/2017  Sign release of information at the check out desk for last bone density from Dr. Vincente Poli   Do stool cards after hemorrhoids settle-  my bet is that the hemorrhoidal bleeding is contributing to the anemia  Please stop by lab before you go If you have mychart- we will send your results within 3 business days of [place] receiving them.  If you do not have mychart- we will call you about results within 5 business days of [place] receiving them.  *please also note that you will see labs on mychart as soon as they post. I will later go in and write notes on them- will say "notes from Dr. Durene Cal"   Recommended follow up: Return for next already scheduled visit or sooner if needed.

## 2023-12-28 NOTE — Progress Notes (Signed)
 Phone [phone] In person visit   Subjective:   Tammy [name] is a 63 y.o. year old very pleasant female patient who presents for/with See problem oriented charting Chief Complaint  Patient presents with   Weight Gain    Pt c/o weight gain although she has not had any sugar in 99 days.   Abnormal Lab    Pt c/o abnormal labs on home wellness visit    Past Medical History-  Patient Active Problem List   Diagnosis Date Noted   Prediabetes 12/28/2023    Priority: Medium    Anxiety 07/24/2020    Priority: Medium    Attention deficit disorder (ADD) in adult 03/31/2020    Priority: Medium    Low bone density 03/17/2020    Priority: Medium    Major depressive disorder in full remission (HCC) 02/15/2020    Priority: Medium    GERD (gastroesophageal reflux disease) 02/15/2020    Priority: Medium    Essential hypertension 02/18/2014    Priority: Medium    Hyperlipidemia 09/18/2012    Priority: Medium    Allergic rhinitis 03/09/2016    Priority: Low   Chronic pain syndrome 02/22/2013    Priority: Low   Chronic insomnia 02/22/2013    Priority: Low   Paresthesias 10/25/2012    Priority: Low   S/P laparoscopic appendectomy 01/22/2023    Priority: 1.   Unilateral primary osteoarthritis, left hip 06/25/2022    Priority: 1.   Hip flexor tendon tightness, left 04/02/2022    Priority: 1.   Greater trochanteric bursitis of left hip 01/11/2022    Priority: 1.   Cervical stenosis of spine 03/31/2020    Priority: 1.   Nonallopathic lesion of cervical region 04/03/2020   Nonallopathic lesion of thoracic region 04/03/2020   Nonallopathic lesion of lumbar region 04/03/2020    Medications- reviewed and updated Current Outpatient Medications  Medication Sig Dispense Refill   ACETAMINOPHEN-CAFFEINE PO Take by mouth in the morning.     ARIPiprazole (ABILIFY) 2 MG tablet Take 0.5-1 tablets (1-2 mg total) by mouth daily. 90 tablet 1   FLUoxetine (PROZAC) 10 MG capsule Take 3  capsules (30 mg total) by mouth daily. 270 capsule 1   hydrochlorothiazide (HYDRODIURIL) 25 MG tablet Take 1 tablet (25 mg total) by mouth daily. 90 tablet 3   lisdexamfetamine (VYVANSE) 10 MG capsule Take 1-2 capsules (10-20 mg total) by mouth daily in the afternoon. 180 capsule 0   lisdexamfetamine (VYVANSE) 20 MG capsule Take 1 capsule (20 mg total) by mouth daily. 90 capsule 0   lisdexamfetamine (VYVANSE) 20 MG capsule Take 1 capsule (20 mg total) by mouth in the morning. 90 capsule 0   MAGNESIUM CHLORIDE-CALCIUM PO Take by mouth.     Melatonin 3 MG CAPS Take 6 mg by mouth at bedtime.     metFORMIN (GLUCOPHAGE-XR) 500 MG 24 hr tablet Take 1 tablet (500 mg total) by mouth daily. May increase to 2 tablets daily. 60 tablet 3   pantoprazole (PROTONIX) 20 MG tablet Take 1 tablet (20 mg total) by mouth daily. 90 tablet 3   Probiotic Product (PROBIOTIC DAILY PO) Take by mouth.     clonazePAM (KLONOPIN) 0.5 MG tablet Take 1 tablet (0.5 mg total) by mouth daily as needed. 30 tablet 0   No current facility-administered medications for this visit.     Objective:  BP 110/72   Pulse 75   Temp (!) 97.2 F (36.2 C)   Ht 5\' 1"  (1.549 m)  Wt 176 lb 12.8 oz (80.2 kg)   LMP 11/05/2007   SpO2 100%   BMI 33.41 kg/m  Gen: NAD, resting comfortably CV: RRR no murmurs rubs or gallops Lungs: CTAB no crackles, wheeze, rhonchi Ext: minimal edema Skin: warm, dry     Assessment and Plan    # weight gain S:patient reports weight gain despite no added sugar in 99 days  -they are considering metformin but she feels like she is not mentally well -still doing fruit  Prior history: - was 170 before Ozempic and got down to 143. Didn't feel well on Ozempic- hair loss, felt like she agreed more quickly, couldn't even enjoy some healthy foods she used to enjoy like solid- felt like she needed carbohydrates instead.  -still able to lap swim  - then insurance changed and had samples then came off last  summer- had weight gain Wt Readings from Last 3 Encounters:  12/28/23 176 lb 12.8 oz (80.2 kg)  02/22/23 159 lb 6.4 oz (72.3 kg)  01/23/23 161 lb 6 oz (73.2 kg)   A/P: Patient has experienced weight gain despite cleaning up her added sugar intake and trying to eat healthy.  Some of this could still be rebound from coming off of Ozempic (great success weight wise but not covered anymore).  She did not feel well on the medication.  She is concerned about starting metformin right now which has been suggested by Earnie Larsson does also have some lab abnormalities that she would like to look into as below.  My recommendation was to give a brief pause on the metformin and allow [place] to investigate her lab abnormalities and try to get her feeling better overall before proceeding forward-we do have 23-month follow-up but discussed if she is feeling better she can certainly restart sooner -Abilify could contribute to weight gain but her depression worsens off of this so we opted to continue it though ordered through psychiatry -I wonder if her generally not feeling well could be caused by thyroid, low B12, iron deficiency-investigating further with labs today  # leukopenia and anemia S:patient had labs at [facility] with white blood cell(s) at 3.3- we looked back over other labs and that is approximately her baseline- differential was reassuring  On other hand has had a gradual trend downward on hemoglobin since may 2024- most recently 11.8 on our labs and she was at 10.5 on labs with [facility]  =colonoscopy was up to date as of 02/04/22 with [facility] -no blood in urine, not coughing up blood, hemorrhoidal bleeding at times.  A/P: Patient's baseline tends to be mildly low white blood count-I am not overly concerned about this.  I am concerned about her new anemia and microcytosis on [facility] at around 69 MCV.  We will update labs as below-discussed likely needs iron replacement. - Does have  hemorrhoids and this could be potential source of bleeding.  Did have diverticulosis but no GI bleeding-rectal changes on last colonoscopy or thought related to rectal prolapse\  # High risk medication use-long-term PPI use-we opted to check B12 labs today-that could cause fatigue/generally not feeling well  # Creatinine slightly low-we opted to recheck today but I am not overly concerned about this  #hypertension S: medication: Hydrochlorothiazide 25 mg A/P: Controlled. Continue current medications.    % # Depression/anxiety/ADD-now managed by psychiatry Corie Chiquito, NP S: Medication:Fluoxetine 10 mg - 3 tablets daily for depression and anxiety, Abilify 1-2 mg for depression, Vyvanse 20 mg for ADD -  Also on clonazepam as needed for anxiety -Also on melatonin for sleep    12/28/2023    8:15 AM 02/22/2023    7:59 AM 02/19/2022    8:00 AM 02/18/2021    8:04 AM 07/24/2020    1:01 PM  Depression screen PHQ 2/9  Decreased Interest 0 0 0 0 0  Down, Depressed, Hopeless 0 1 0 0 0  PHQ - 2 Score 0 1 0 0 0  Altered sleeping 0 0 0  0  Tired, decreased energy 0 0 0  0  Change in appetite 0 1 0  0  Feeling bad or failure about yourself  0 1 0  0  Trouble concentrating 0 0 0  0  Moving slowly or fidgety/restless 0 0 0  0  Suicidal thoughts 0 0 0  0  PHQ-9 Score 0 3 0  0  Difficult doing work/chores Not difficult at all Not difficult at all Not difficult at all    A/P: Depression appears well-controlled-continue current medication-did discuss Abilify's role and potential weight gain  # GERD S:Medication: Protonix 20 mg A/P: Controlled. Continue current medications..  No reported melena  # Hyperglycemia/insulin resistance/prediabetes- peak a1c of 5.9 S:  Medication: None-metformin has been prescribed for weight loss efforts Exercise and diet-see discussion above-doing a great job with exercise and try to eat healthy Lab Results      Component                Value               Date                       HGBA1C                   5.9                 02/22/2023                HGBA1C                   5.8                 08/22/2014            A/P: Hopefully stable-not on medication-but is considering starting metformin if we can help her feel better  Recommended follow up: Return for next already scheduled visit or sooner if needed. Future Appointments  Date Time Provider Department Center  02/02/2024  7:45 AM Judi Saa, DO [facility] None  02/28/2024  8:00 AM Shelva Majestic, MD [facility]  03/13/2024  8:00 AM Joan Flores, NP [facility] None    Lab/Order associations:   ICD-10-CM   1. High risk medication use  Z79.899 Vitamin B12    2. Microcytic anemia  D50.9 CBC with Differential/Platelet    IBC + Ferritin    Fecal occult blood, imunochemical    3. Screening for diabetes mellitus  Z13.1 Hemoglobin A1c    4. Obesity (BMI 30-39.9)  E66.9 Hemoglobin A1c    5. Hyperlipidemia, unspecified hyperlipidemia type  E78.5 TSH    Comprehensive metabolic panel with GFR    6. Essential hypertension  I10     7. Major depressive disorder with single episode, in full remission (HCC)  F32.5     8. Prediabetes  R73.03       No orders of the defined types were placed in this encounter.  Return precautions advised.  Tana Conch, MD

## 2023-12-29 ENCOUNTER — Encounter: Payer: Self-pay | Admitting: Family Medicine

## 2024-01-05 DIAGNOSIS — F332 Major depressive disorder, recurrent severe without psychotic features: Secondary | ICD-10-CM | POA: Diagnosis not present

## 2024-01-12 DIAGNOSIS — K648 Other hemorrhoids: Secondary | ICD-10-CM | POA: Diagnosis not present

## 2024-01-12 DIAGNOSIS — Z79899 Other long term (current) drug therapy: Secondary | ICD-10-CM | POA: Diagnosis not present

## 2024-01-12 DIAGNOSIS — R632 Polyphagia: Secondary | ICD-10-CM | POA: Diagnosis not present

## 2024-01-12 DIAGNOSIS — E66811 Obesity, class 1: Secondary | ICD-10-CM | POA: Diagnosis not present

## 2024-01-12 DIAGNOSIS — D508 Other iron deficiency anemias: Secondary | ICD-10-CM | POA: Diagnosis not present

## 2024-01-12 DIAGNOSIS — R7303 Prediabetes: Secondary | ICD-10-CM | POA: Diagnosis not present

## 2024-01-12 DIAGNOSIS — Z6832 Body mass index (BMI) 32.0-32.9, adult: Secondary | ICD-10-CM | POA: Diagnosis not present

## 2024-01-14 ENCOUNTER — Other Ambulatory Visit: Payer: Self-pay | Admitting: Family Medicine

## 2024-01-16 ENCOUNTER — Other Ambulatory Visit: Payer: Self-pay

## 2024-01-16 ENCOUNTER — Other Ambulatory Visit (HOSPITAL_COMMUNITY): Payer: Self-pay

## 2024-01-16 MED ORDER — HYDROCHLOROTHIAZIDE 25 MG PO TABS
25.0000 mg | ORAL_TABLET | Freq: Every day | ORAL | 3 refills | Status: AC
  Filled 2024-01-16: qty 90, 90d supply, fill #0
  Filled 2024-04-11: qty 90, 90d supply, fill #1
  Filled 2024-07-10: qty 90, 90d supply, fill #2
  Filled 2024-10-08: qty 90, 90d supply, fill #3

## 2024-01-30 ENCOUNTER — Other Ambulatory Visit: Payer: Self-pay | Admitting: Family Medicine

## 2024-01-30 ENCOUNTER — Other Ambulatory Visit (HOSPITAL_COMMUNITY): Payer: Self-pay

## 2024-01-30 MED ORDER — PANTOPRAZOLE SODIUM 20 MG PO TBEC
20.0000 mg | DELAYED_RELEASE_TABLET | Freq: Every day | ORAL | 3 refills | Status: AC
  Filled 2024-01-30: qty 90, 90d supply, fill #0
  Filled 2024-05-03: qty 90, 90d supply, fill #1
  Filled 2024-07-30: qty 90, 90d supply, fill #2

## 2024-01-31 NOTE — Progress Notes (Unsigned)
 [facility] [address] [place] [address] Phone: [phone] Subjective:   Tammy [name], am serving as a scribe for Dr. Ronnell [name].   I'm seeing this patient by the request  of:  Tammy Jaeger, MD  CC: Left hip and neck pain  [id number]  Tammy [name] is a 63 y.o. female coming in with complaint of L hip and leg pain. Saw in 2023 for GT bursitis. Epidural on 11/25/2023. Patient states that the epidural was not effective. Increase in nerve pain that radiates down the leg to her foot. Pain over greater trochanter. Wants to be able to walk for exercise. Tried PT last year but is open to trying PT here at our office.      Past Medical History:  Diagnosis Date   Arthritis    Chronic fatigue syndrome    Diverticulosis    External hemorrhoids    Hypertension    Internal hemorrhoids    Neuropathy    Spinal stenosis    Past Surgical History:  Procedure Laterality Date   APPENDECTOMY     CARDIAC CATHETERIZATION  09/18/2012   LAPAROSCOPIC APPENDECTOMY N/A 01/22/2023   Procedure: APPENDECTOMY LAPAROSCOPIC;  Surgeon: Tammy Stabs, MD;  Location: [facility] OR;  Service: General;  Laterality: N/A;   LEFT HEART CATHETERIZATION WITH CORONARY ANGIOGRAM N/A 09/18/2012   Procedure: LEFT HEART CATHETERIZATION WITH CORONARY ANGIOGRAM;  Surgeon: Tammy Lapping, MD;  Location: [facility] CATH LAB;  Service: Cardiovascular;  Laterality: N/A;   TONSILLECTOMY  09/21/1963   Social History   Socioeconomic History   Marital status: Married    Spouse name: Not on file   Number of children: 2   Years of education: Not on file   Highest education level: Master's degree (e.g., MA, MS, MEng, MEd, MSW, MBA)  Occupational History   Occupation: Conservation officer, 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 2 adopted children- adopted daughter (85) when single after first marriage from [place]. Son from [place] with 2nd husband(18) in 2021. 1st husband local physician Tammy Chalet      LCSW with [employer]      Hobbies: animal rescue- has litter of kittens in 2021, walking, time outside   Social Drivers of [employer] Strain: Low Risk  (12/26/2023)   Overall Financial Resource Strain (CARDIA)    Difficulty of Paying Living Expenses: Not hard at all  Food Insecurity: No Food Insecurity (12/26/2023)   Hunger Vital Sign    Worried About Running Out of Food in the Last Year: Never true    Ran Out of Food in the Last Year: Never true  Transportation Needs: No Transportation Needs (12/26/2023)   PRAPARE - Administrator, Civil Service (Medical): No    Lack of Transportation (Non-Medical): No  Physical Activity: Sufficiently Active (12/26/2023)   Exercise Vital Sign    Days of Exercise per Week: 3 days    Minutes of Exercise per Session: 70 min  Stress: No Stress Concern Present (12/26/2023)   [employer] of Occupational Health - Occupational Stress Questionnaire    Feeling of Stress : Only a little  Social Connections: Moderately  Integrated (12/26/2023)   Social Connection and Isolation Panel [NHANES]    Frequency of Communication with Friends and Family: Three times a week    Frequency of Social Gatherings with Friends and Family: Once a week    Attends Religious Services: 1 to 4 times per year    Active Member of [employer] or Organizations: No    Attends Engineer, structural: Not on file    Marital Status: Married   Allergies  Allergen Reactions   Egg-Derived Products Other (See Comments)    Makes pt feel like burping up rotten eggs   Family History  Problem Relation Age of Onset   Heart attack Maternal Grandmother 50   Arthritis Maternal  Grandmother    Hyperlipidemia Maternal Grandmother    Heart disease Maternal Grandmother    Diabetes Maternal Grandmother    Pancreatic cancer Mother        died 77. 2.5 years past diagnosis.     Anxiety disorder Mother    Depression Mother    Colon polyps Father    Healthy Sister    Healthy Brother    Depression Brother    Heart attack Brother        age 13   Suicidality Maternal Grandfather    Colon cancer Neg Hx    Esophageal cancer Neg Hx    Kidney disease Neg Hx     Current Outpatient Medications (Endocrine & Metabolic):    metFORMIN  (GLUCOPHAGE -XR) 500 MG 24 hr tablet, Take 1 tablet (500 mg total) by mouth daily. May increase to 2 tablets daily.  Current Outpatient Medications (Cardiovascular):    hydrochlorothiazide  (HYDRODIURIL ) 25 MG tablet, Take 1 tablet (25 mg total) by mouth daily.   Current Outpatient Medications (Analgesics):    ACETAMINOPHEN -CAFFEINE PO, Take by mouth in the morning.   Current Outpatient Medications (Other):    ARIPiprazole  (ABILIFY ) 2 MG tablet, Take 0.5-1 tablets (1-2 mg total) by mouth daily.   FLUoxetine  (PROZAC ) 10 MG capsule, Take 3 capsules (30 mg total) by mouth daily.   lisdexamfetamine (VYVANSE ) 10 MG capsule, Take 1-2 capsules (10-20 mg total) by mouth daily in the afternoon.   lisdexamfetamine (VYVANSE ) 20 MG capsule, Take 1 capsule (20 mg total) by mouth daily.   lisdexamfetamine (VYVANSE ) 20 MG capsule, Take 1 capsule (20 mg total) by mouth in the morning.   MAGNESIUM CHLORIDE-CALCIUM  PO, Take by mouth.   Melatonin 3 MG CAPS, Take 6 mg by mouth at bedtime.   pantoprazole  (PROTONIX ) 20 MG tablet, Take 1 tablet (20 mg total) by mouth daily.   Probiotic Product (PROBIOTIC DAILY PO), Take by mouth.   clonazePAM  (KLONOPIN ) 0.5 MG tablet, Take 1 tablet (0.5 mg total) by mouth daily as needed.   Reviewed prior external information including notes and imaging from  primary care provider As well as notes that were available from care  everywhere and other healthcare systems.  Past medical history, social, surgical and family history all reviewed in electronic medical record.  No pertanent information unless stated regarding to the chief complaint.   Review of Systems:  No headache, visual changes, nausea, vomiting, diarrhea, constipation, dizziness, abdominal pain, skin rash, fevers, chills, night sweats, weight loss, swollen lymph nodes, body aches, joint swelling, chest pain, shortness of breath, mood changes. POSITIVE muscle aches  Objective  Blood pressure 118/84, pulse 86, height 5\' 1"  (1.549 m), weight 176 lb (79.8 kg), last menstrual period 11/05/2007, SpO2 97%.   General: No apparent distress alert and oriented x3 mood  and affect normal, dressed appropriately.  HEENT: Pupils equal, extraocular movements intact  Respiratory: Patient's speak in full sentences and does not appear short of breath  Cardiovascular: No lower extremity edema, non tender, no erythema  Low back does have some loss lordosis.  Some tightness noted with sidebending.  Severely tender to palpation over the greater trochanteric area.  Tightness of the left sided hip flexor.  Good range of motion of the hip otherwise but more pain with FABER test.   Procedure: Real-time Ultrasound Guided Injection of left  greater trochanteric bursitis secondary to patient's body habitus Device: GE Logiq Q7  Ultrasound guided injection is preferred based studies that show increased duration, increased effect, greater accuracy, decreased procedural pain, increased response rate, and decreased cost with ultrasound guided versus blind injection.  Verbal informed consent obtained.  Time-out conducted.  Noted no overlying erythema, induration, or other signs of local infection.  Skin prepped in a sterile fashion.  Local anesthesia: Topical Ethyl chloride.  With sterile technique and under real time ultrasound guidance:  Greater trochanteric area was visualized and  patient's bursa was noted. A 22-gauge 3 inch needle was inserted and 4 cc of 0.5% Marcaine  and 1 cc of Kenalog  40 mg/dL was injected. Pictures taken Completed without difficulty  Pain immediately resolved suggesting accurate placement of the medication.  Advised to call if fevers/chills, erythema, induration, drainage, or persistent bleeding.  Images permanently stored  Impression: Technically successful ultrasound guided injection.    Impression and Recommendations:      The above documentation has been reviewed and is accurate and complete Kenya Shiraishi M Thelbert Gartin, DO

## 2024-02-02 ENCOUNTER — Encounter: Payer: Self-pay | Admitting: Family Medicine

## 2024-02-02 ENCOUNTER — Other Ambulatory Visit: Payer: Self-pay

## 2024-02-02 ENCOUNTER — Ambulatory Visit: Admitting: Family Medicine

## 2024-02-02 ENCOUNTER — Telehealth: Payer: Self-pay | Admitting: Family Medicine

## 2024-02-02 VITALS — BP 118/84 | HR 86 | Ht 61.0 in | Wt 176.0 lb

## 2024-02-02 DIAGNOSIS — F332 Major depressive disorder, recurrent severe without psychotic features: Secondary | ICD-10-CM | POA: Diagnosis not present

## 2024-02-02 DIAGNOSIS — M25552 Pain in left hip: Secondary | ICD-10-CM | POA: Diagnosis not present

## 2024-02-02 DIAGNOSIS — M7062 Trochanteric bursitis, left hip: Secondary | ICD-10-CM | POA: Diagnosis not present

## 2024-02-02 NOTE — Patient Instructions (Addendum)
 Injection in GT today Good to see you! PT [facility]  See you again in 8 weeks

## 2024-02-02 NOTE — Telephone Encounter (Signed)
 Patient is asking about her weight being incorrect on her AVS from the weight that was taken today. Are we able to get this updated for her.

## 2024-02-02 NOTE — Assessment & Plan Note (Signed)
 Patient did not respond extremely well to the epidural.  Hopeful that this is more localized.  Discussed icing regimen of home exercises, which activities to do and which ones to avoid.  Increase activity slowly over the course of next several weeks.  Discussed icing regimen.  Follow-up again in 6 to 8 weeks otherwise.

## 2024-02-08 ENCOUNTER — Other Ambulatory Visit (HOSPITAL_BASED_OUTPATIENT_CLINIC_OR_DEPARTMENT_OTHER): Payer: Self-pay

## 2024-02-10 ENCOUNTER — Other Ambulatory Visit (HOSPITAL_COMMUNITY): Payer: Self-pay

## 2024-02-15 DIAGNOSIS — F332 Major depressive disorder, recurrent severe without psychotic features: Secondary | ICD-10-CM | POA: Diagnosis not present

## 2024-02-22 NOTE — Therapy (Signed)
 OUTPATIENT PHYSICAL THERAPY EVALUATION   Patient Name: Tammy [name] MRN: [id number] DOB:[birth year], 63 y.o., female Today's Date: 02/23/2024   END OF SESSION:  PT End of Session - 02/23/24 0819     Visit Number 1    Number of Visits 16    Date for PT Re-Evaluation 04/19/24    Authorization Type [facility] [employer]    PT Start Time 0800    PT Stop Time 0855    PT Time Calculation (min) 55 min    Activity Tolerance Patient tolerated treatment well    Behavior During Therapy [facility] for tasks assessed/performed             Past Medical History:  Diagnosis Date   Arthritis    Chronic fatigue syndrome    Diverticulosis    External hemorrhoids    Hypertension    Internal hemorrhoids    Neuropathy    Spinal stenosis    Past Surgical History:  Procedure Laterality Date   APPENDECTOMY     CARDIAC CATHETERIZATION  09/18/2012   LAPAROSCOPIC APPENDECTOMY N/A 01/22/2023   Procedure: APPENDECTOMY LAPAROSCOPIC;  Surgeon: Melvenia Stabs, MD;  Location: [facility] OR;  Service: General;  Laterality: N/A;   LEFT HEART CATHETERIZATION WITH CORONARY ANGIOGRAM N/A 09/18/2012   Procedure: LEFT HEART CATHETERIZATION WITH CORONARY ANGIOGRAM;  Surgeon: Arnoldo Lapping, MD;  Location: [facility] CATH LAB;  Service: Cardiovascular;  Laterality: N/A;   TONSILLECTOMY  09/21/1963   Patient Active Problem List   Diagnosis Date Noted   Prediabetes 12/28/2023   S/P laparoscopic appendectomy 01/22/2023   Unilateral primary osteoarthritis, left hip 06/25/2022   Hip flexor tendon tightness, left 04/02/2022   Greater trochanteric bursitis of left hip 01/11/2022   Anxiety 07/24/2020   Nonallopathic lesion of cervical region 04/03/2020   Nonallopathic lesion of thoracic region 04/03/2020   Nonallopathic lesion of lumbar region 04/03/2020   Cervical stenosis of spine 03/31/2020   Attention deficit disorder (ADD) in adult 03/31/2020   Low bone density 03/17/2020   Major depressive disorder in full remission (HCC)  02/15/2020   GERD (gastroesophageal reflux disease) 02/15/2020   Allergic rhinitis 03/09/2016   Essential hypertension 02/18/2014   Chronic pain syndrome 02/22/2013   Chronic insomnia 02/22/2013   Paresthesias 10/25/2012   Hyperlipidemia 09/18/2012    PCP: Almira Jaeger, MD  REFERRING PROVIDER: Isidro Margo, DO  REFERRING DIAG: Left hip pain  THERAPY DIAG:  Pain in left hip  Other low back pain  Muscle weakness (generalized)  Rationale for Evaluation and Treatment: Rehabilitation  ONSET DATE: Chronic   SUBJECTIVE:  SUBJECTIVE STATEMENT: Patient reports left hip and leg pain that has been going on for a while. She used to walk an hour a day but now can't take walks so she has been doing more swimming. She reports a arthritic hip but also some sine issues that could be contributing to pain going down the leg. She has had shots in the bursa of the leg hip but that has done nothing for her pain. She also had lumbar injection, the first one helped but the most recent one didn't do anything. She does occasionally get a catching pain in her lower back but it is not constant.The left hip and leg pain are more constant and were waking her up at night. She did have previous PT and the dry needling did not seem to help, and the other treatments did not seem to resolve the issue. Currently her most comfortable positions are lying down or  standing, sitting or lying on left side seems to aggravate her pain in addition to walking and stairs. She states she has difficulty leading with the left leg going up stairs. She does take Tylenol  when she needs to.   PERTINENT HISTORY: See PMH above  PAIN:  Are you having pain? Yes:  NPRS scale: 1/10 currently, 7/10 at worst Pain location: Left hip and leg Pain description: Sharp, burning, catching/stabbing Aggravating factors: Walking, sitting, lying on left side, going up stairs Relieving factors: Lying down, standing  PRECAUTIONS:  None  RED FLAGS: None   WEIGHT BEARING RESTRICTIONS: No  FALLS:  Has patient fallen in last 6 months? No  OCCUPATION: Works for behavioral medicine, sitting or standing at desk  PLOF: Independent  PATIENT GOALS: Be able to take long walks, at least 30-45 minutes   OBJECTIVE:  Note: Objective measures were completed at Evaluation unless otherwise noted. PATIENT SURVEYS:  PSFS: 6 Walking 30-60 minutes: 8 Stairs: 6 Sitting comfortably: 5 Weight training safely: 5  COGNITION: Overall cognitive status: Within functional limits for tasks assessed   SENSATION: WFL  MUSCLE LENGTH: Hamstring limitation bilaterally Thomas test [facility], patient reports lateral hip and low back discomfort  POSTURE:   Grossly WFL  PALPATION: Tenderness to left greater trochanteric region  Lumbar CPAs grossly WFL, patient reports localized soreness  LOWER EXTREMITY ROM:   Hip PROM grossly WFL    Lumbar ROM grossly WFL  LOWER EXTREMITY MMT:  MMT Right eval Left eval  Hip flexion 5 4  Hip extension 4- 4-  Hip abduction 4 4-  Hip adduction    Hip internal rotation    Hip external rotation    Knee flexion 5 4  Knee extension 5 5  Ankle dorsiflexion    Ankle plantarflexion    Ankle inversion    Ankle eversion     (Blank rows = not tested)  FUNCTIONAL TESTS:  DLLT: 60 deg Squat: grossly WFL with cueing  GAIT: Assistive device utilized: None Level of assistance: Complete Independence Comments: Trendelenburg                                                                                                                               TREATMENT  [facility] Adult PT Treatment:                                                DATE: 02/23/2024 90-90 alternating foot tap 2 x 10 Bridge with red 8 x 3 sec Side clamshell with red x 10  Discussed GTPS and showed patients anatomy of hip to explain likely reasoning of lateral hip pain. Explained goals of strengthening for core and hip musculature  and gradual return to gym exercises and walking. Discussed pain level and acceptable pain for continuing activity and exercises.   PATIENT EDUCATION:  Education details: Exam findings, POC, HEP Person educated: Patient Education method: Explanation, Demonstration, Tactile cues, Verbal cues, and Handouts Education comprehension: verbalized understanding, returned demonstration, verbal cues required, tactile cues required, and needs further education  HOME EXERCISE PROGRAM: Access Code: [email or web address]   ASSESSMENT: CLINICAL IMPRESSION: Patient is a 63 y.o. female who was seen today for physical therapy evaluation and treatment for chronic left hip and leg pain. 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 She does exhibit some left hip weakness compared to the right and tenderness over the left greater trochanter and hip musculature.   OBJECTIVE IMPAIRMENTS: decreased activity tolerance, decreased strength, impaired flexibility, and pain.   ACTIVITY LIMITATIONS: lifting, sitting, squatting, sleeping, stairs, and locomotion level  PARTICIPATION LIMITATIONS: shopping, community activity, and occupation  PERSONAL FACTORS: Fitness, Past/current experiences, and Time since onset of injury/illness/exacerbation are also affecting patient's functional outcome.   REHAB POTENTIAL: Good  CLINICAL DECISION MAKING: Stable/uncomplicated  EVALUATION COMPLEXITY: Low   GOALS: Goals reviewed with patient? Yes  SHORT TERM GOALS: Target date: 03/22/2024  Patient will be I with initial HEP in order to progress with therapy. Baseline: HEP provided at eval Goal status: INITIAL  2.  Patient will report left hip pain </= 5/10 with activity in order to reduce functional limitations Baseline: 7/10 pain Goal status: INITIAL  LONG TERM GOALS: Target date: 04/19/2024  Patient will be I with final HEP to maintain progress from PT. Baseline: HEP  provided at eval Goal status: INITIAL  2.  Patient will report PSFS >/= 8 in order to indicate an improvement in functional ability Baseline: 6 Goal status: INITIAL  3.  Patient will demonstrate hip strength >/= 4+/5 MMT in order to improve her walking tolerance and stair negotiation Baseline: see limitations above Goal status: INITIAL  4.  Patient will report left hip pain </= 3/10 with activity in order to reduce functional limitations Baseline: 7/10 pain Goal status: INITIAL   PLAN: PT FREQUENCY: 1-2x/week  PT DURATION: 8 weeks  PLANNED INTERVENTIONS: 97164- PT Re-evaluation, 97750- Physical Performance Testing, 97110-Therapeutic exercises, 97530- Therapeutic activity, 97112- Neuromuscular re-education, 97535- Self Care, 16109- Manual therapy, [phone]- Ionotophoresis 4mg /ml Dexamethasone , 09811 (1-2 muscles), 20561 (3+ muscles)- Dry Needling, Patient/Family education, Balance training, Stair training, Taping, Joint mobilization, Joint manipulation, Spinal manipulation, Spinal mobilization, Cryotherapy, and Moist heat  PLAN FOR NEXT SESSION: Review HEP and progress PRN, focus on hip strengthening and control, progressing to closed chain exercises    Leah Primus, PT, DPT, LAT, ATC 02/23/24  11:29 AM Phone: [phone] Fax: [phone]

## 2024-02-23 ENCOUNTER — Encounter: Payer: Self-pay | Admitting: Physical Therapy

## 2024-02-23 ENCOUNTER — Other Ambulatory Visit: Payer: Self-pay

## 2024-02-23 ENCOUNTER — Ambulatory Visit (INDEPENDENT_AMBULATORY_CARE_PROVIDER_SITE_OTHER): Admitting: Physical Therapy

## 2024-02-23 DIAGNOSIS — M25552 Pain in left hip: Secondary | ICD-10-CM | POA: Diagnosis not present

## 2024-02-23 DIAGNOSIS — M6281 Muscle weakness (generalized): Secondary | ICD-10-CM | POA: Diagnosis not present

## 2024-02-23 DIAGNOSIS — M5459 Other low back pain: Secondary | ICD-10-CM

## 2024-02-23 NOTE — Patient Instructions (Signed)
 Access Code: [email or web address] URL: [email or web address] Date: 02/23/2024 Prepared by: Leah Primus  Exercises - Supine 90/90 Alternating Toe Touch  - 1 x daily - 3 sets - 10 reps - Bridge with Resistance  - 1 x daily - 3 sets - 8 reps - 3 seconds hold - Clam with Resistance  - 1 x daily - 3 sets - 10 reps

## 2024-02-27 ENCOUNTER — Encounter: Payer: Self-pay | Admitting: Physical Therapy

## 2024-02-27 ENCOUNTER — Other Ambulatory Visit: Payer: Self-pay

## 2024-02-27 ENCOUNTER — Ambulatory Visit (INDEPENDENT_AMBULATORY_CARE_PROVIDER_SITE_OTHER): Admitting: Physical Therapy

## 2024-02-27 DIAGNOSIS — M6281 Muscle weakness (generalized): Secondary | ICD-10-CM | POA: Diagnosis not present

## 2024-02-27 DIAGNOSIS — M25552 Pain in left hip: Secondary | ICD-10-CM | POA: Diagnosis not present

## 2024-02-27 DIAGNOSIS — M5459 Other low back pain: Secondary | ICD-10-CM | POA: Diagnosis not present

## 2024-02-27 NOTE — Therapy (Signed)
 OUTPATIENT PHYSICAL THERAPY TREATMENT   Patient Name: Tammy [name] MRN: [id number] DOB:[birth year], 63 y.o., female Today's Date: 02/27/2024   END OF SESSION:  PT End of Session - 02/27/24 0800     Visit Number 2    Number of Visits 16    Date for PT Re-Evaluation 04/19/24    Authorization Type [facility] [employer]    PT Start Time 0800    PT Stop Time 0845    PT Time Calculation (min) 45 min    Activity Tolerance Patient tolerated treatment well    Behavior During Therapy [facility] for tasks assessed/performed              Past Medical History:  Diagnosis Date   Arthritis    Chronic fatigue syndrome    Diverticulosis    External hemorrhoids    Hypertension    Internal hemorrhoids    Neuropathy    Spinal stenosis    Past Surgical History:  Procedure Laterality Date   APPENDECTOMY     CARDIAC CATHETERIZATION  09/18/2012   LAPAROSCOPIC APPENDECTOMY N/A 01/22/2023   Procedure: APPENDECTOMY LAPAROSCOPIC;  Surgeon: Melvenia Stabs, MD;  [facility]: [facility];  Service: General;  Laterality: N/A;   LEFT HEART CATHETERIZATION WITH CORONARY ANGIOGRAM N/A 09/18/2012   Procedure: LEFT HEART CATHETERIZATION WITH CORONARY ANGIOGRAM;  Surgeon: Arnoldo Lapping, MD;  Location: [facility] CATH LAB;  Service: Cardiovascular;  Laterality: N/A;   TONSILLECTOMY  09/21/1963   Patient Active Problem List   Diagnosis Date Noted   Prediabetes 12/28/2023   S/P laparoscopic appendectomy 01/22/2023   Unilateral primary osteoarthritis, left hip 06/25/2022   Hip flexor tendon tightness, left 04/02/2022   Greater trochanteric bursitis of left hip 01/11/2022   Anxiety 07/24/2020   Nonallopathic lesion of cervical region 04/03/2020   Nonallopathic lesion of thoracic region 04/03/2020   Nonallopathic lesion of lumbar region 04/03/2020   Cervical stenosis of spine 03/31/2020   Attention deficit disorder (ADD) in adult 03/31/2020   Low bone density 03/17/2020   Major depressive disorder in full remission (HCC)  02/15/2020   GERD (gastroesophageal reflux disease) 02/15/2020   Allergic rhinitis 03/09/2016   Essential hypertension 02/18/2014   Chronic pain syndrome 02/22/2013   Chronic insomnia 02/22/2013   Paresthesias 10/25/2012   Hyperlipidemia 09/18/2012    PCP: Almira Jaeger, MD  REFERRING PROVIDER: Isidro Margo, DO  REFERRING DIAG: Left hip pain  THERAPY DIAG:  Pain in left hip  Other low back pain  Muscle weakness (generalized)  Rationale for Evaluation and Treatment: Rehabilitation  ONSET DATE: Chronic   SUBJECTIVE:  SUBJECTIVE STATEMENT: Patient reports she is more sore on the left lateral hip and thigh. She belives the clamshell exercise is causing the soreness.   Eval: Patient reports left hip and leg pain that has been going on for a while. She used to walk an hour a day but now can't take walks so she has been doing more swimming. She reports a arthritic hip but also some sine issues that could be contributing to pain going down the leg. She has had shots in the bursa of the leg hip but that has done nothing for her pain. She also had lumbar injection, the first one helped but the most recent one didn't do anything. She does occasionally get a catching pain in her lower back but it is not constant.The left hip and leg pain are more constant and were waking her up at night. She did have previous PT and the dry  needling did not seem to help, and the other treatments did not seem to resolve the issue. Currently her most comfortable positions are lying down or standing, sitting or lying on left side seems to aggravate her pain in addition to walking and stairs. She states she has difficulty leading with the left leg going up stairs. She does take Tylenol  when she needs to.   PERTINENT HISTORY: See PMH above  PAIN:  Are you having pain? Yes:  NPRS scale: 4/10 currently, 7/10 at worst Pain location: Left hip and leg Pain description: Sore, sharp, burning,  catching/stabbing Aggravating factors: Walking, sitting, lying on left side, going up stairs Relieving factors: Lying down, standing  PRECAUTIONS: None  RED FLAGS: None   WEIGHT BEARING RESTRICTIONS: No  FALLS:  Has patient fallen in last 6 months? No  OCCUPATION: Works for behavioral medicine, sitting or standing at desk  PLOF: Independent  PATIENT GOALS: Be able to take long walks, at least 30-45 minutes   OBJECTIVE:  Note: Objective measures were completed at Evaluation unless otherwise noted. PATIENT SURVEYS:  PSFS: 6 Walking 30-60 minutes: 8 Stairs: 6 Sitting comfortably: 5 Weight training safely: 5  MUSCLE LENGTH: Hamstring limitation bilaterally Thomas test [facility], patient reports lateral hip and low back discomfort  POSTURE:   Grossly WFL  PALPATION: Tenderness to left greater trochanteric region  Lumbar CPAs grossly WFL, patient reports localized soreness  LOWER EXTREMITY ROM:   Hip PROM grossly WFL    Lumbar ROM grossly WFL  LOWER EXTREMITY MMT:  MMT Right eval Left eval Left 02/27/2024  Hip flexion 5 4   Hip extension 4- 4-   Hip abduction 4 4- 4-  Hip adduction     Hip internal rotation     Hip external rotation     Knee flexion 5 4   Knee extension 5 5   Ankle dorsiflexion     Ankle plantarflexion     Ankle inversion     Ankle eversion      (Blank rows = not tested)  FUNCTIONAL TESTS:  DLLT: 60 deg Squat: grossly WFL with cueing  GAIT: Assistive device utilized: None Level of assistance: Complete Independence Comments: Trendelenburg                                                                                                                               TREATMENT  [facility] Adult PT Treatment:                                                DATE: 02/27/2024 90-90 alternating foot tap 3 x 10 Bridge with yellow at knees 2 x 10 x 3 sec Side clamshell with yellow 3 x 10 SLR 3 x 10 each Squat to table tap 3 x 10 Standing hip abduction  isometric at wall  5 x 15 seconds  Discussed walking program, finding a good starting time for walking based on hip tolerance and staying at that time for about a week to improve tolerance before progressing her time.   PATIENT EDUCATION:  Education details: HEP update Person educated: Patient Education method: Explanation, Demonstration, Tactile cues, Verbal cues, and Handouts Education comprehension: verbalized understanding, returned demonstration, verbal cues required, tactile cues required, and needs further education  HOME EXERCISE PROGRAM: Access Code: [email or web address]   ASSESSMENT: CLINICAL IMPRESSION: Patient tolerated therapy well with no adverse effects. She did arrive reporting more soreness of the left hip which is expected with beginning a new exercise program. Therapy continued to focus on progressing core and hip strengthening with good tolerance. Regressed banded resistance for hip strengthening exercises to reduce level of soreness. She was able to progress with some hip strengthening and was able to incorporate hip abduction isometrics and squats to progress strength and endurance. Updated HEP to progress exercises at home and discussed performing exercises every other day if needed to reduce muscle soreness. Patient would benefit from continued skilled PT to progress mobility and strength in order to reduce pain and maximize functional ability.   Eval: Patient is a 63 y.o. female who was seen today for physical therapy evaluation and treatment for chronic left hip and leg pain. 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 She does exhibit some left hip weakness compared to the right and tenderness over the left greater trochanter and hip musculature.   OBJECTIVE IMPAIRMENTS: decreased activity tolerance, decreased strength, impaired flexibility, and pain.   ACTIVITY LIMITATIONS: lifting, sitting, squatting, sleeping,  stairs, and locomotion level  PARTICIPATION LIMITATIONS: shopping, community activity, and occupation  PERSONAL FACTORS: Fitness, Past/current experiences, and Time since onset of injury/illness/exacerbation are also affecting patient's functional outcome.    GOALS: Goals reviewed with patient? Yes  SHORT TERM GOALS: Target date: 03/22/2024  Patient will be I with initial HEP in order to progress with therapy. Baseline: HEP provided at eval Goal status: INITIAL  2.  Patient will report left hip pain </= 5/10 with activity in order to reduce functional limitations Baseline: 7/10 pain Goal status: INITIAL  LONG TERM GOALS: Target date: 04/19/2024  Patient will be I with final HEP to maintain progress from PT. Baseline: HEP provided at eval Goal status: INITIAL  2.  Patient will report PSFS >/= 8 in order to indicate an improvement in functional ability Baseline: 6 Goal status: INITIAL  3.  Patient will demonstrate hip strength >/= 4+/5 MMT in order to improve her walking tolerance and stair negotiation Baseline: see limitations above Goal status: INITIAL  4.  Patient will report left hip pain </= 3/10 with activity in order to reduce functional limitations Baseline: 7/10 pain Goal status: INITIAL   PLAN: PT FREQUENCY: 1-2x/week  PT DURATION: 8 weeks  PLANNED INTERVENTIONS: 97164- PT Re-evaluation, 97750- Physical Performance Testing, 97110-Therapeutic exercises, 97530- Therapeutic activity, 97112- Neuromuscular re-education, 97535- Self Care, 96045- Manual therapy, [phone]- Ionotophoresis 4mg /ml Dexamethasone , 19147 (1-2 muscles), 20561 (3+ muscles)- Dry Needling, Patient/Family education, Balance training, Stair training, Taping, Joint mobilization, Joint manipulation, Spinal manipulation, Spinal mobilization, Cryotherapy, and Moist heat  PLAN FOR NEXT SESSION: Review HEP and progress PRN, focus on hip strengthening and control, progressing to closed chain  exercises    Leah Primus, PT, DPT, LAT, ATC 02/27/24  9:00 AM Phone: [phone] Fax: [phone]

## 2024-02-27 NOTE — Patient Instructions (Signed)
 Access Code: [email or web address] URL: [email or web address] Date: 02/27/2024 Prepared by: Leah Primus  Exercises - Supine 90/90 Alternating Toe Touch  - 1 x daily - 3 sets - 10 reps - Bridge with Resistance  - 1 x daily - 3 sets - 8 reps - 3 seconds hold - Clam with Resistance  - 1 x daily - 3 sets - 10 reps - Active Straight Leg Raise with Quad Set  - 1 x daily - 3 sets - 10 reps - Squat with Chair Touch  - 1 x daily - 3 sets - 10 reps - Isometric Gluteus Medius at Wall  - 1 x daily - 5 reps - 15 seconds hold

## 2024-02-28 ENCOUNTER — Encounter: Payer: Self-pay | Admitting: Family Medicine

## 2024-02-28 ENCOUNTER — Ambulatory Visit (INDEPENDENT_AMBULATORY_CARE_PROVIDER_SITE_OTHER): Payer: 59 | Admitting: Family Medicine

## 2024-02-28 VITALS — BP 108/60 | HR 87 | Temp 97.5°F | Ht 61.0 in | Wt 173.2 lb

## 2024-02-28 DIAGNOSIS — E785 Hyperlipidemia, unspecified: Secondary | ICD-10-CM

## 2024-02-28 DIAGNOSIS — Z0000 Encounter for general adult medical examination without abnormal findings: Secondary | ICD-10-CM

## 2024-02-28 DIAGNOSIS — E538 Deficiency of other specified B group vitamins: Secondary | ICD-10-CM | POA: Diagnosis not present

## 2024-02-28 DIAGNOSIS — D509 Iron deficiency anemia, unspecified: Secondary | ICD-10-CM | POA: Diagnosis not present

## 2024-02-28 NOTE — Progress Notes (Signed)
 Phone [phone]   Subjective:  Patient presents today for their annual physical. Chief complaint-noted.   See problem oriented charting- ROS- full  review of systems was completed and negative Per full ROS sheet completed by patient except for topics noted under acute/chronic concerns  The following were reviewed and entered/updated in epic: Past Medical History:  Diagnosis Date   Arthritis    Chronic fatigue syndrome    Diverticulosis    External hemorrhoids    Hypertension    Internal hemorrhoids    Neuropathy    Spinal stenosis    Patient Active Problem List   Diagnosis Date Noted   Prediabetes 12/28/2023    Priority: Medium    Anxiety 07/24/2020    Priority: Medium    Attention deficit disorder (ADD) in adult 03/31/2020    Priority: Medium    Low bone density 03/17/2020    Priority: Medium    Major depressive disorder in full remission (HCC) 02/15/2020    Priority: Medium    GERD (gastroesophageal reflux disease) 02/15/2020    Priority: Medium    Essential hypertension 02/18/2014    Priority: Medium    Hyperlipidemia 09/18/2012    Priority: Medium    Allergic rhinitis 03/09/2016    Priority: Low   Chronic pain syndrome 02/22/2013    Priority: Low   Chronic insomnia 02/22/2013    Priority: Low   Paresthesias 10/25/2012    Priority: Low   S/P laparoscopic appendectomy 01/22/2023    Priority: 1.   Unilateral primary osteoarthritis, left hip 06/25/2022    Priority: 1.   Hip flexor tendon tightness, left 04/02/2022    Priority: 1.   Greater trochanteric bursitis of left hip 01/11/2022    Priority: 1.   Cervical stenosis of spine 03/31/2020    Priority: 1.   Nonallopathic lesion of cervical region 04/03/2020   Nonallopathic lesion of thoracic region 04/03/2020   Nonallopathic lesion of lumbar region 04/03/2020   Past Surgical History:  Procedure Laterality Date   APPENDECTOMY     CARDIAC CATHETERIZATION  09/18/2012   LAPAROSCOPIC APPENDECTOMY N/A  01/22/2023   Procedure: APPENDECTOMY LAPAROSCOPIC;  Surgeon: Melvenia Stabs, MD;  Location: [facility] OR;  Service: General;  Laterality: N/A;   LEFT HEART CATHETERIZATION WITH CORONARY ANGIOGRAM N/A 09/18/2012   Procedure: LEFT HEART CATHETERIZATION WITH CORONARY ANGIOGRAM;  Surgeon: Arnoldo Lapping, MD;  Location: [facility] CATH LAB;  Service: Cardiovascular;  Laterality: N/A;   TONSILLECTOMY  09/21/1963    Family History  Problem Relation Age of Onset   Heart attack Maternal Grandmother 50   Arthritis Maternal Grandmother    Hyperlipidemia Maternal Grandmother    Heart disease Maternal Grandmother    Diabetes Maternal Grandmother    Pancreatic cancer Mother        died 36. 2.5 years past diagnosis.     Anxiety disorder Mother    Depression Mother    Colon polyps Father    Healthy Sister    Healthy Brother    Depression Brother    Heart attack Brother        age 16   Suicidality Maternal Grandfather    Colon cancer Neg Hx    Esophageal cancer Neg Hx    Kidney disease Neg Hx     Medications- reviewed and updated Current Outpatient Medications  Medication Sig Dispense Refill   ACETAMINOPHEN -CAFFEINE PO Take by mouth in the morning.     ARIPiprazole  (ABILIFY ) 2 MG tablet Take 0.5-1 tablets (1-2 mg total) by mouth daily. 90  tablet 1   FLUoxetine  (PROZAC ) 10 MG capsule Take 3 capsules (30 mg total) by mouth daily. 270 capsule 1   hydrochlorothiazide  (HYDRODIURIL ) 25 MG tablet Take 1 tablet (25 mg total) by mouth daily. 90 tablet 3   lisdexamfetamine (VYVANSE ) 10 MG capsule Take 1-2 capsules (10-20 mg total) by mouth daily in the afternoon. 180 capsule 0   lisdexamfetamine (VYVANSE ) 20 MG capsule Take 1 capsule (20 mg total) by mouth daily. 90 capsule 0   lisdexamfetamine (VYVANSE ) 20 MG capsule Take 1 capsule (20 mg total) by mouth in the morning. 90 capsule 0   MAGNESIUM CHLORIDE-CALCIUM  PO Take by mouth.     Melatonin 3 MG CAPS Take 6 mg by mouth at bedtime.     metFORMIN   (GLUCOPHAGE -XR) 500 MG 24 hr tablet Take 1 tablet (500 mg total) by mouth daily. May increase to 2 tablets daily. 60 tablet 3   pantoprazole  (PROTONIX ) 20 MG tablet Take 1 tablet (20 mg total) by mouth daily. 90 tablet 3   Probiotic Product (PROBIOTIC DAILY PO) Take by mouth.     clonazePAM  (KLONOPIN ) 0.5 MG tablet Take 1 tablet (0.5 mg total) by mouth daily as needed. 30 tablet 0   No current facility-administered medications for this visit.    Allergies-reviewed and updated Allergies  Allergen Reactions   Egg-Derived Products Other (See Comments)    Makes pt feel like burping up rotten eggs    Social History   Social History Narrative   Married. 2 adopted children- adopted daughter (14) when single after first marriage from [place]. Son from [place] with 2nd husband(18) in 2021. 1st husband local physician Rica Chalet      LCSW with [employer]      Hobbies: animal rescue- has litter of kittens in 2021, walking, time outside   Objective  Objective:  BP 108/60   Pulse 87   Temp (!) 97.5 F (36.4 C)   Ht 5\' 1"  (1.549 m)   Wt 173 lb 3.2 oz (78.6 kg)   LMP 11/05/2007   SpO2 96%   BMI 32.73 kg/m  Gen: NAD, resting comfortably HEENT: Mucous membranes are moist. Oropharynx normal Neck: no thyromegaly CV: RRR no murmurs rubs or gallops Lungs: CTAB no crackles, wheeze, rhonchi Abdomen: soft/nontender/nondistended/normal bowel sounds. No rebound or guarding.  Ext: no edema Skin: warm, dry Neuro: grossly normal, moves all extremities, PERRLA   Assessment and Plan   63 y.o. female presenting for annual physical.  Health Maintenance counseling: 1. Anticipatory guidance: Patient counseled regarding regular dental exams -q6 months, eye exams - yearly,  avoiding smoking and second hand smoke , limiting alcohol to 1 beverage per day- no alcohol , no illicit drugs .   2. Risk factor reduction:  Advised patient of need for regular exercise and diet rich and fruits and vegetables  to reduce risk of heart attack and stroke.  Exercise- in the pool this morning already- 3 days a week .  Diet/weight management-down 3 lbs from last visit- started on Rybelsus  about 2 weeks ago- has helped with appetite suppression.   Wt Readings from Last 3 Encounters:  02/28/24 173 lb 3.2 oz (78.6 kg)  02/02/24 176 lb (79.8 kg)  12/28/23 176 lb 12.8 oz (80.2 kg)  3. Immunizations/screenings/ancillary studies- COVID shot planned once they update it in the fall.  Immunization History  Administered Date(s) Administered   Influenza,inj,Quad PF,6+ Mos 06/19/2013   Influenza-Unspecified 06/29/2020   PFIZER Comirnaty Martina Sledge Top)Covid-19 Tri-Sucrose Vaccine 01/28/2021   PFIZER(Purple Top)SARS-COV-2 Vaccination  09/25/2019, 10/16/2019   Pfizer Covid-19 Vaccine Bivalent Booster 70yrs & up 07/09/2021   Pfizer(Comirnaty )Fall Seasonal Vaccine 12 years and older 07/07/2022, 06/01/2023   Tdap 08/22/2014   Unspecified SARS-COV-2 Vaccination 09/25/2019   Zoster Recombinant(Shingrix) 02/18/2021, 06/23/2021   Zoster, Live 02/18/2021  4. Cervical cancer screening- 06/10/21 with 3 year repeat possibly- not sure if had HPV- would be reasonable to schedule visit October or so to see if needs repeat 5. Breast cancer screening-  breast exam with gyn and mammogram 05/11/2023- consider annual with gynecology  6. Colon cancer screening - may 2023 and was told 10 year repeat- unless we cannot control anemia with iron- the fact that she has had one so recently is encouraging 7. Skin cancer screening- Dr. Daved Eriksson as needed in the past- was told 5 years. advised regular sunscreen use. Denies worrisome, changing, or new skin lesions.  8. Birth control/STD check- postmenopause/monogamous 9. Osteoporosis screening at 63- -1.2 right femoral neck 12/10/14 [facility].   has had repeat with them - I can see she had another in 2022 and they suggested another in 2024- reasonable to schedule follow up with them and  update 10. Smoking associated screening - never smoker  Status of chronic or acute concerns   # Left hip pain-working with Dr. Felipe Horton as well as physical therapy- just started    # Obesity-working with Dr. Lajuana Pilar with [facility]-at last visit had not lost any weight despite no added sugars in 99 days.  They have been considering metformin  -using glucose monitor with Dr. Lajuana Pilar since last visit. Does get into high 60's at times but does not feel poorly- if does feel poorly I would like to know and we may verify with glucometer in that case- on libre 3.  Wt Readings from Last 3 Encounters:  02/28/24 173 lb 3.2 oz (78.6 kg)  02/02/24 176 lb (79.8 kg)  12/28/23 176 lb 12.8 oz (80.2 kg)   #hypertension S: medication: Hydrochlorothiazide  25 mg BP Readings from Last 3 Encounters:  02/28/24 108/60  02/02/24 118/84  12/28/23 110/72  A/P: well controlled continue current medications    #hyperlipidemia-CT cardiac scoring 04/23/22 score of 0 S: Medication:None -Small vessel disease was noted on MRI of the brain in the past but patient has wanted to work on lifestyle changes -half brother with MI in 30s but different mothers Lab Results  Component Value Date   CHOL 252 (H) 02/22/2023   HDL 83.90 02/22/2023   LDLCALC 151 (H) 02/22/2023   TRIG 84.0 02/22/2023   CHOLHDL 3 02/22/2023   A/P: LDL moderately elevated- update today- hoping for some improvement especially with how aggressive she's bene about limiting added sugars. Off added sugar since January.    % # Depression/anxiety/ADD-now managed by psychiatry Marita Sidle, NP S: Medication:Fluoxetine  10 mg - 3 tablets daily for depression and anxiety, Abilify  1 mg for depression, Vyvanse  20 mg for ADD -Also on clonazepam  as needed for anxiety -Also on melatonin for sleep A/P: Depression - feels she is making some progress here and less discouraged about weight pattern which was a barrier last time - no improvement in energy though  despite B12 and iron   # GERD S:Medication: Protonix  20 mg A/P: stable- continue current medicines  - could try protonix  every other day and if needed on off days do famotidine /pepcid   #vitilgo- protopic  as needed for the face   # Hyperglycemia/insulin resistance/prediabetes- peak a1c of 6.4 S:  Medication: rybelsus     3 mg  but may go to 7 mg A/P: a1c creeping up but now on Rybelsus  and seems to be turning corner- we will recheck in 6 months when she comes back  # B12 deficiency- 272 relative 2025  S: Current treatment/medication (oral vs. IM):  1000 mcg daily       A/P: hopefully improved- update b12 today. Continue current meds for now    # Low iron- ferrous sulfate every other day - no constipation thankfully  Recommended follow up: Return in 6 months (on 08/29/2024) for followup or sooner if needed.Schedule b4 you leave. Future Appointments  Date Time Provider Department Center  03/13/2024  8:00 AM Marita Sidle A, NP [facility] None  03/26/2024  8:00 AM Lindaann Requena, PT [facility] None  03/30/2024  8:00 AM Lindaann Requena, PT [facility] None  04/09/2024  8:00 AM Lindaann Requena, PT [facility] None  04/10/2024  7:45 AM Isidro Margo, DO [facility] None  04/17/2024  8:00 AM Lindaann Requena, PT [facility] None  04/20/2024  8:00 AM Lindaann Requena, PT [facility] None  04/24/2024  8:00 AM Lindaann Requena, PT [facility] None  04/27/2024  8:00 AM Lindaann Requena, PT [facility] None  05/01/2024  8:00 AM Lindaann Requena, PT [facility] None  05/04/2024  8:00 AM Lindaann Requena, PT [facility] None   Lab/Order associations: fasting   ICD-10-CM   1. Routine general medical examination at a health care facility  Z00.00     2. Hyperlipidemia, unspecified hyperlipidemia type  E78.5 CBC with Differential/Platelet    Lipid panel    3. B12 deficiency  E53.8 Vitamin B12    4. Iron deficiency anemia, unspecified iron deficiency anemia type  D50.9 IBC + Ferritin      No orders of the defined  types were placed in this encounter.   Return precautions advised.  Clarisa Crooked, MD

## 2024-02-28 NOTE — Addendum Note (Signed)
 Addended by: Almira Jaeger on: 02/28/2024 05:33 PM   Modules accepted: Level of Service

## 2024-02-28 NOTE — Patient Instructions (Addendum)
 Possible pap with GYN in October and also due for repeat bone density at least per our records  could try protonix  every other day and if needed on off days do famotidine /Pepcid  twice daily before meals  Please stop by lab before you go If you have mychart- we will send your results within 3 business days of us  receiving them.  If you do not have mychart- we will call you about results within 5 business days of us  receiving them.  *please also note that you will see labs on mychart as soon as they post. I will later go in and write notes on them- will say "notes from Dr. Arlene Ben"   Recommended follow up: Return in 6 months (on 08/29/2024) for followup or sooner if needed.Schedule b4 you leave.   Health Maintenance, Female Adopting a healthy lifestyle and getting preventive care are important in promoting health and wellness. Ask your health care provider about: The right schedule for you to have regular tests and exams. Things you can do on your own to prevent diseases and keep yourself healthy. What should I know about diet, weight, and exercise? Eat a healthy diet  Eat a diet that includes plenty of vegetables, fruits, low-fat dairy products, and lean protein. Do not eat a lot of foods that are high in solid fats, added sugars, or sodium. Maintain a healthy weight Body mass index (BMI) is used to identify weight problems. It estimates body fat based on height and weight. Your health care provider can help determine your BMI and help you achieve or maintain a healthy weight. Get regular exercise Get regular exercise. This is one of the most important things you can do for your health. Most adults should: Exercise for at least 150 minutes each week. The exercise should increase your heart rate and make you sweat (moderate-intensity exercise). Do strengthening exercises at least twice a week. This is in addition to the moderate-intensity exercise. Spend less time sitting. Even light physical  activity can be beneficial. Watch cholesterol and blood lipids Have your blood tested for lipids and cholesterol at 65 years of age, then have this test every 5 years. Have your cholesterol levels checked more often if: Your lipid or cholesterol levels are high. You are older than 65 years of age. You are at high risk for heart disease. What should I know about cancer screening? Depending on your health history and family history, you may need to have cancer screening at various ages. This may include screening for: Breast cancer. Cervical cancer. Colorectal cancer. Skin cancer. Lung cancer. What should I know about heart disease, diabetes, and high blood pressure? Blood pressure and heart disease High blood pressure causes heart disease and increases the risk of stroke. This is more likely to develop in people who have high blood pressure readings or are overweight. Have your blood pressure checked: Every 3-5 years if you are 72-59 years of age. Every year if you are 8 years old or older. Diabetes Have regular diabetes screenings. This checks your fasting blood sugar level. Have the screening done: Once every three years after age 4 if you are at a normal weight and have a low risk for diabetes. More often and at a younger age if you are overweight or have a high risk for diabetes. What should I know about preventing infection? Hepatitis B If you have a higher risk for hepatitis B, you should be screened for this virus. Talk with your health care provider to find  out if you are at risk for hepatitis B infection. Hepatitis C Testing is recommended for: Everyone born from [birth year] through 1967. Anyone with known risk factors for hepatitis C. Sexually transmitted infections (STIs) Get screened for STIs, including gonorrhea and chlamydia, if: You are sexually active and are younger than 65 years of age. You are older than 65 years of age and your health care provider tells you that you  are at risk for this type of infection. Your sexual activity has changed since you were last screened, and you are at increased risk for chlamydia or gonorrhea. Ask your health care provider if you are at risk. Ask your health care provider about whether you are at high risk for HIV. Your health care provider may recommend a prescription medicine to help prevent HIV infection. If you choose to take medicine to prevent HIV, you should first get tested for HIV. You should then be tested every 3 months for as long as you are taking the medicine. Pregnancy If you are about to stop having your period (premenopausal) and you may become pregnant, seek counseling before you get pregnant. Take 400 to 800 micrograms (mcg) of folic acid every day if you become pregnant. Ask for birth control (contraception) if you want to prevent pregnancy. Osteoporosis and menopause Osteoporosis is a disease in which the bones lose minerals and strength with aging. This can result in bone fractures. If you are 56 years old or older, or if you are at risk for osteoporosis and fractures, ask your health care provider if you should: Be screened for bone loss. Take a calcium  or vitamin D  supplement to lower your risk of fractures. Be given hormone replacement therapy (HRT) to treat symptoms of menopause. Follow these instructions at home: Alcohol use Do not drink alcohol if: Your health care provider tells you not to drink. You are pregnant, may be pregnant, or are planning to become pregnant. If you drink alcohol: Limit how much you have to: 0-1 drink a day. Know how much alcohol is in your drink. In the [place] one drink equals one 12 oz bottle of beer (355 mL), one 5 oz glass of wine (148 mL), or one 1 oz glass of hard liquor (44 mL). Lifestyle Do not use any products that contain nicotine or tobacco. These products include cigarettes, chewing tobacco, and vaping devices, such as e-cigarettes. If you need help quitting,  ask your health care provider. Do not use street drugs. Do not share needles. Ask your health care provider for help if you need support or information about quitting drugs. General instructions Schedule regular health, dental, and eye exams. Stay current with your vaccines. Tell your health care provider if: You often feel depressed. You have ever been abused or do not feel safe at home. Summary Adopting a healthy lifestyle and getting preventive care are important in promoting health and wellness. Follow your health care provider's instructions about healthy diet, exercising, and getting tested or screened for diseases. Follow your health care provider's instructions on monitoring your cholesterol and blood pressure. This information is not intended to replace advice given to you by your health care provider. Make sure you discuss any questions you have with your health care provider. Document Revised: 01/26/2021 Document Reviewed: 01/26/2021 Elsevier Patient Education  2024 [employer].

## 2024-03-01 DIAGNOSIS — F332 Major depressive disorder, recurrent severe without psychotic features: Secondary | ICD-10-CM | POA: Diagnosis not present

## 2024-03-02 ENCOUNTER — Ambulatory Visit: Payer: Self-pay | Admitting: Family Medicine

## 2024-03-13 ENCOUNTER — Encounter: Payer: Self-pay | Admitting: Behavioral Health

## 2024-03-13 ENCOUNTER — Ambulatory Visit: Admitting: Behavioral Health

## 2024-03-13 ENCOUNTER — Other Ambulatory Visit: Payer: Self-pay

## 2024-03-13 DIAGNOSIS — F3342 Major depressive disorder, recurrent, in full remission: Secondary | ICD-10-CM

## 2024-03-13 DIAGNOSIS — F419 Anxiety disorder, unspecified: Secondary | ICD-10-CM

## 2024-03-13 DIAGNOSIS — F988 Other specified behavioral and emotional disorders with onset usually occurring in childhood and adolescence: Secondary | ICD-10-CM

## 2024-03-13 MED ORDER — LISDEXAMFETAMINE DIMESYLATE 20 MG PO CAPS
20.0000 mg | ORAL_CAPSULE | Freq: Every day | ORAL | 0 refills | Status: DC
Start: 2024-03-13 — End: 2024-06-12
  Filled 2024-03-13: qty 90, 90d supply, fill #0

## 2024-03-13 MED ORDER — FLUOXETINE HCL 10 MG PO CAPS
30.0000 mg | ORAL_CAPSULE | Freq: Every day | ORAL | 1 refills | Status: DC
  Filled 2024-03-13: qty 270, 90d supply, fill #0
  Filled 2024-06-08: qty 270, 90d supply, fill #1

## 2024-03-13 MED ORDER — ARIPIPRAZOLE 2 MG PO TABS
1.0000 mg | ORAL_TABLET | Freq: Every day | ORAL | 1 refills | Status: DC
  Filled 2024-03-13: qty 90, 90d supply, fill #0
  Filled 2024-06-08: qty 90, 90d supply, fill #1

## 2024-03-13 MED ORDER — LISDEXAMFETAMINE DIMESYLATE 10 MG PO CAPS
10.0000 mg | ORAL_CAPSULE | Freq: Every day | ORAL | 0 refills | Status: AC
Start: 2024-03-13 — End: ?
  Filled 2024-03-13: qty 180, 90d supply, fill #0

## 2024-03-13 NOTE — Progress Notes (Signed)
 [facility]  Patient ID: Tammy [name],  MRN: [id number]  PCP: Katrinka Garnette KIDD, MD  Date of Evaluation: 03/13/2024 Time spent:30 minutes  Chief Complaint:   HISTORY/CURRENT STATUS: HPI Tammy [name] presents to the office today for follow-up of anxiety, depression, and ADHD.  She is a previous patient of Harlene Pepper.  Collateral information should be considered reliable. Says she continues to enjoy good stability. She would like to consider trying to wean off Abilify .  She attempted to wean off one other time several years ago and experienced return of symptoms.  She is requesting no adjustment or changes to her current medications at this visit.  Denies history of mania, no psychosis, no auditory or visual hallucinations.  Denies SI.  Requesting 59-month follow-up.     Past Psychiatric Medication Trials: Cymbalta  Zoloft- Interfered with concentration and thinking was not clear Lexapro- Nocturnal panic attacks.   Prozac - Was on 40 mg of Prozac  at the time she was having akathisia with Abilify  2 mg. Higher doses seemed to exacerbate akathisia Abilify - helpful. Started on 2 mg daily. Had akathisia and twitching at 5 mg Rexulti- Had very low motivation and fatigue Lamictal- rash Klonopin - Takes as needed. Did not help with rumination. Ambien  Vyvanse - took 20 mg in the past for ADHD. Re-started 10 mg dose. Remeron- Gained 20 lbs. Gabapentin - Given for neuropathy and caused cloudy thoughts.  Lamotrigine- tingling/burning around mouth and vagina. No rash or blisters.  May have been helpful for mood. Topamax    Had [facility] testing that indicated adverse effects to Zoloft, Lexapro, and Paxil.   Individual Medical History/ Review of Systems: Changes? :No   Allergies: Egg-derived products  Current Medications:  Current Outpatient Medications:    ACETAMINOPHEN -CAFFEINE PO, Take by mouth in the morning., Disp: , Rfl:    ARIPiprazole  (ABILIFY ) 2 MG tablet, Take 0.5-1  tablets (1-2 mg total) by mouth daily., Disp: 90 tablet, Rfl: 1   clonazePAM  (KLONOPIN ) 0.5 MG tablet, Take 1 tablet (0.5 mg total) by mouth daily as needed., Disp: 30 tablet, Rfl: 0   FLUoxetine  (PROZAC ) 10 MG capsule, Take 3 capsules (30 mg total) by mouth daily., Disp: 270 capsule, Rfl: 1   hydrochlorothiazide  (HYDRODIURIL ) 25 MG tablet, Take 1 tablet (25 mg total) by mouth daily., Disp: 90 tablet, Rfl: 3   lisdexamfetamine (VYVANSE ) 10 MG capsule, Take 1-2 capsules (10-20 mg total) by mouth daily in the afternoon., Disp: 180 capsule, Rfl: 0   lisdexamfetamine (VYVANSE ) 20 MG capsule, Take 1 capsule (20 mg total) by mouth in the morning., Disp: 90 capsule, Rfl: 0   lisdexamfetamine (VYVANSE ) 20 MG capsule, Take 1 capsule (20 mg total) by mouth daily., Disp: 90 capsule, Rfl: 0   MAGNESIUM CHLORIDE-CALCIUM  PO, Take by mouth., Disp: , Rfl:    Melatonin 3 MG CAPS, Take 6 mg by mouth at bedtime., Disp: , Rfl:    metFORMIN  (GLUCOPHAGE -XR) 500 MG 24 hr tablet, Take 1 tablet (500 mg total) by mouth daily. May increase to 2 tablets daily., Disp: 60 tablet, Rfl: 3   pantoprazole  (PROTONIX ) 20 MG tablet, Take 1 tablet (20 mg total) by mouth daily., Disp: 90 tablet, Rfl: 3   Probiotic Product (PROBIOTIC DAILY PO), Take by mouth., Disp: , Rfl:  Medication Side Effects: none  Family Medical/ Social History: Changes? No  MENTAL HEALTH EXAM:  Last menstrual period 11/05/2007.There is no height or weight on file to calculate BMI.  General Appearance: Casual, Neat, and Well Groomed  Eye Contact:  Good  Speech:  Clear and Coherent  Volume:  Normal  Mood:  NA  Affect:  Appropriate  Thought Process:  Coherent  Orientation:  Full (Time, Place, and Person)  Thought Content: Logical   Suicidal Thoughts:  No  Homicidal Thoughts:  No  Memory:  WNL  Judgement:  Good  Insight:  Good  Psychomotor Activity:  Normal  Concentration:  Concentration: Good  Recall:  Good  Fund of Knowledge: Good  Language: Good   Assets:  Desire for Improvement  ADL's:  Intact  Cognition: WNL  Prognosis:  Good    DIAGNOSES:    ICD-10-CM   1. Recurrent major depressive disorder, in full remission (HCC)  F33.42 FLUoxetine  (PROZAC ) 10 MG capsule    ARIPiprazole  (ABILIFY ) 2 MG tablet    2. Anxiety  F41.9 FLUoxetine  (PROZAC ) 10 MG capsule    3. Attention deficit disorder (ADD) in adult  F98.8 lisdexamfetamine (VYVANSE ) 20 MG capsule    lisdexamfetamine (VYVANSE ) 10 MG capsule      Receiving Psychotherapy: No    RECOMMENDATIONS:  Greater than 50% of  30 min face to face time with patient was spent on counseling and coordination of care. Discussed her desire to possibly try weaning off Abilify . Talked about risk of relapse and how to reduce chances of relapse.  She will continue with current medication regimen for now.    We agreed to:   Will continue current plan of care since target signs and symptoms are well controlled without any tolerability issues. Continue Abilify  1 mg daily for augmentation of depression. Discussed that Metformin  is used off-label for antipsychotic induced weight gain. Discussed that this may be a possible treatment consideration in the future.  Continue Prozac  30 mg daily for anxiety and depression.  Continue Vyvanse  20 mg daily for ADHD.  Continue klonopin  0.5 mg daily as needed for anxiety.  Provided emergency contact information Pt to follow-up in 3  months or sooner if clinically indicated.  Patient advised to contact office with any questions, adverse effects, or acute worsening in signs and symptoms. Reviewed PDMP    Tammy DELENA Pizza, NP

## 2024-03-14 ENCOUNTER — Other Ambulatory Visit (HOSPITAL_COMMUNITY): Payer: Self-pay

## 2024-03-15 DIAGNOSIS — F332 Major depressive disorder, recurrent severe without psychotic features: Secondary | ICD-10-CM | POA: Diagnosis not present

## 2024-03-26 ENCOUNTER — Other Ambulatory Visit: Payer: Self-pay

## 2024-03-26 ENCOUNTER — Encounter: Payer: Self-pay | Admitting: Physical Therapy

## 2024-03-26 ENCOUNTER — Ambulatory Visit (INDEPENDENT_AMBULATORY_CARE_PROVIDER_SITE_OTHER): Admitting: Physical Therapy

## 2024-03-26 DIAGNOSIS — M25552 Pain in left hip: Secondary | ICD-10-CM

## 2024-03-26 DIAGNOSIS — M6281 Muscle weakness (generalized): Secondary | ICD-10-CM | POA: Diagnosis not present

## 2024-03-26 DIAGNOSIS — M5459 Other low back pain: Secondary | ICD-10-CM

## 2024-03-26 NOTE — Therapy (Signed)
 OUTPATIENT PHYSICAL THERAPY TREATMENT   Patient Name: Tammy [name] MRN: [id number] DOB:1961-09-20, 63 y.o., female Today's Date: 03/26/2024   END OF SESSION:  PT End of Session - 03/26/24 0800     Visit Number 3    Number of Visits 16    Date for PT Re-Evaluation 04/19/24    Authorization Type [facility] [employer]    PT Start Time 0800    PT Stop Time 0845    PT Time Calculation (min) 45 min    Activity Tolerance Patient tolerated treatment well    Behavior During Therapy [facility] for tasks assessed/performed            Past Medical History:  Diagnosis Date   Arthritis    Chronic fatigue syndrome    Diverticulosis    External hemorrhoids    Hypertension    Internal hemorrhoids    Neuropathy    Spinal stenosis    Past Surgical History:  Procedure Laterality Date   APPENDECTOMY     CARDIAC CATHETERIZATION  09/18/2012   LAPAROSCOPIC APPENDECTOMY N/A 01/22/2023   Procedure: APPENDECTOMY LAPAROSCOPIC;  Surgeon: Teresa Lonni HERO, MD;  Location: [facility] OR;  Service: General;  Laterality: N/A;   LEFT HEART CATHETERIZATION WITH CORONARY ANGIOGRAM N/A 09/18/2012   Procedure: LEFT HEART CATHETERIZATION WITH CORONARY ANGIOGRAM;  Surgeon: Ozell Fell, MD;  Location: [facility] CATH LAB;  Service: Cardiovascular;  Laterality: N/A;   TONSILLECTOMY  09/21/1963   Patient Active Problem List   Diagnosis Date Noted   Prediabetes 12/28/2023   S/P laparoscopic appendectomy 01/22/2023   Unilateral primary osteoarthritis, left hip 06/25/2022   Hip flexor tendon tightness, left 04/02/2022   Greater trochanteric bursitis of left hip 01/11/2022   Anxiety 07/24/2020   Nonallopathic lesion of cervical region 04/03/2020   Nonallopathic lesion of thoracic region 04/03/2020   Nonallopathic lesion of lumbar region 04/03/2020   Cervical stenosis of spine 03/31/2020   Attention deficit disorder (ADD) in adult 03/31/2020   Low bone density 03/17/2020   Major depressive disorder in full remission (HCC)  02/15/2020   GERD (gastroesophageal reflux disease) 02/15/2020   Allergic rhinitis 03/09/2016   Essential hypertension 02/18/2014   Chronic pain syndrome 02/22/2013   Chronic insomnia 02/22/2013   Paresthesias 10/25/2012   Hyperlipidemia 09/18/2012    PCP: Katrinka Garnette KIDD, MD  REFERRING PROVIDER: Claudene Arthea HERO, DO  REFERRING DIAG: Left hip pain  THERAPY DIAG:  Pain in left hip  Other low back pain  Muscle weakness (generalized)  Rationale for Evaluation and Treatment: Rehabilitation  ONSET DATE: Chronic   SUBJECTIVE:  SUBJECTIVE STATEMENT: Patient reports she is doing well. She went on a walk once since last visit and states it hurt like crazy, she went for about 30 minutes.  Eval: Patient reports left hip and leg pain that has been going on for a while. She used to walk an hour a day but now can't take walks so she has been doing more swimming. She reports a arthritic hip but also some sine issues that could be contributing to pain going down the leg. She has had shots in the bursa of the leg hip but that has done nothing for her pain. She also had lumbar injection, the first one helped but the most recent one didn't do anything. She does occasionally get a catching pain in her lower back but it is not constant.The left hip and leg pain are more constant and were waking her up at night. She did have previous PT and  the dry needling did not seem to help, and the other treatments did not seem to resolve the issue. Currently her most comfortable positions are lying down or standing, sitting or lying on left side seems to aggravate her pain in addition to walking and stairs. She states she has difficulty leading with the left leg going up stairs. She does take Tylenol  when she needs to.   PERTINENT HISTORY: See PMH above  PAIN:  Are you having pain? Yes:  NPRS scale: 4/10 currently, 7/10 at worst Pain location: Left hip and leg Pain description: Sore, sharp, burning,  catching/stabbing Aggravating factors: Walking, sitting, lying on left side, going up stairs Relieving factors: Lying down, standing  PRECAUTIONS: None  PATIENT GOALS: Be able to take long walks, at least 30-45 minutes   OBJECTIVE:  Note: Objective measures were completed at Evaluation unless otherwise noted. PATIENT SURVEYS:  PSFS: 6 Walking 30-60 minutes: 8 Stairs: 6 Sitting comfortably: 5 Weight training safely: 5  MUSCLE LENGTH: Hamstring limitation bilaterally [facility], patient reports lateral hip and low back discomfort  PALPATION: Tenderness to left greater trochanteric region  Lumbar CPAs grossly WFL, patient reports localized soreness  LOWER EXTREMITY ROM:   Hip PROM grossly WFL    Lumbar ROM grossly WFL  LOWER EXTREMITY MMT:  MMT Right eval Left eval Left 02/27/2024  Hip flexion 5 4   Hip extension 4- 4-   Hip abduction 4 4- 4-  Hip adduction     Hip internal rotation     Hip external rotation     Knee flexion 5 4   Knee extension 5 5   Ankle dorsiflexion     Ankle plantarflexion     Ankle inversion     Ankle eversion      (Blank rows = not tested)  FUNCTIONAL TESTS:  DLLT: 60 deg Squat: grossly WFL with cueing  GAIT: Assistive device utilized: None Level of assistance: Complete Independence Comments: Trendelenburg                                                                                                                               TREATMENT  [facility] Adult PT Treatment:                                                DATE: 03/26/2024 Recumbent bike L4 x 5 min to improve endurance and workload capacity Bridge x 10 90-90 alternating foot tap x 10 Figure-4 bridge 2 x 8 each 90-90 alternating leg extension 2 x 10 Side clamshell with red 3 x 10 each Squat to table tap x 10, holding 15# at chest 2 x 10 Lateral band walk with red at knees 3 x 20 down/back Deadlift with 20# 3 x 10  Discussed using gym machines to progress LE  strengthening  PATIENT EDUCATION:  Education details: HEP update Person educated: Patient Education method: Explanation, Demonstration, Tactile cues, Verbal cues, and Handouts Education comprehension: verbalized understanding, returned demonstration, verbal cues required, tactile cues required, and needs further education  HOME EXERCISE PROGRAM: Access Code: [email or web address]   ASSESSMENT: CLINICAL IMPRESSION: Patient tolerated therapy well with no adverse effects. Therapy focused on continued strengthening for her core, hips, and back with good tolerance. She was able to progress with weight for her squats and incorporated lifting with good technique. She did report muscle burning with exercises but denied any pain with therapy. Updated her HEP to progress strengthening for home and discussed using machines at gym to progress strengthening. Patient would benefit from continued skilled PT to progress mobility and strength in order to reduce pain and maximize functional ability.   Eval: Patient is a 63 y.o. female who was seen today for physical therapy evaluation and treatment for chronic left hip and leg pain. 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 She does exhibit some left hip weakness compared to the right and tenderness over the left greater trochanter and hip musculature.   OBJECTIVE IMPAIRMENTS: decreased activity tolerance, decreased strength, impaired flexibility, and pain.   ACTIVITY LIMITATIONS: lifting, sitting, squatting, sleeping, stairs, and locomotion level  PARTICIPATION LIMITATIONS: shopping, community activity, and occupation  PERSONAL FACTORS: Fitness, Past/current experiences, and Time since onset of injury/illness/exacerbation are also affecting patient's functional outcome.    GOALS: Goals reviewed with patient? Yes  SHORT TERM GOALS: Target date: 03/22/2024  Patient will be I with initial HEP in order  to progress with therapy. Baseline: HEP provided at eval 03/26/2024: independent with initial HEP Goal status: MET  2.  Patient will report left hip pain </= 5/10 with activity in order to reduce functional limitations Baseline: 7/10 pain 03/26/2024: continues to report pain with walking Goal status: ONGOING  LONG TERM GOALS: Target date: 04/19/2024  Patient will be I with final HEP to maintain progress from PT. Baseline: HEP provided at eval Goal status: INITIAL  2.  Patient will report PSFS >/= 8 in order to indicate an improvement in functional ability Baseline: 6 Goal status: INITIAL  3.  Patient will demonstrate hip strength >/= 4+/5 MMT in order to improve her walking tolerance and stair negotiation Baseline: see limitations above Goal status: INITIAL  4.  Patient will report left hip pain </= 3/10 with activity in order to reduce functional limitations Baseline: 7/10 pain Goal status: INITIAL   PLAN: PT FREQUENCY: 1-2x/week  PT DURATION: 8 weeks  PLANNED INTERVENTIONS: 97164- PT Re-evaluation, 97750- Physical Performance Testing, 97110-Therapeutic exercises, 97530- Therapeutic activity, 97112- Neuromuscular re-education, 97535- Self Care, 02859- Manual therapy, [id number]- Ionotophoresis 4mg /ml Dexamethasone , 79439 (1-2 muscles), 20561 (3+ muscles)- Dry Needling, Patient/Family education, Balance training, Stair training, Taping, Joint mobilization, Joint manipulation, Spinal manipulation, Spinal mobilization, Cryotherapy, and Moist heat  PLAN FOR NEXT SESSION: Review HEP and progress PRN, focus on hip strengthening and control, progressing to closed chain exercises    Elaine Daring, PT, DPT, LAT, ATC 03/26/24  8:54 AM Phone: [phone] Fax: [phone]

## 2024-03-26 NOTE — Patient Instructions (Signed)
 Access Code: [email or web address] URL: [email or web address] Date: 03/26/2024 Prepared by: Elaine Daring  Exercises - Supine 90/90 with Leg Extensions  - 1 x daily - 3 sets - 10 reps - Figure 4 Bridge  - 1 x daily - 3 sets - 8 reps - Clam with Resistance  - 1 x daily - 3 sets - 10 reps - Active Straight Leg Raise with Quad Set  - 1 x daily - 3 sets - 10 reps - Squat with Chair Touch  - 1 x daily - 3 sets - 10 reps - Side Stepping with Resistance at Thighs  - 1 x daily - 3 sets - 20 reps - Isometric Gluteus Medius at Wall  - 1 x daily - 5 reps - 15 seconds hold

## 2024-03-29 DIAGNOSIS — F332 Major depressive disorder, recurrent severe without psychotic features: Secondary | ICD-10-CM | POA: Diagnosis not present

## 2024-03-30 ENCOUNTER — Other Ambulatory Visit: Payer: Self-pay

## 2024-03-30 ENCOUNTER — Ambulatory Visit (INDEPENDENT_AMBULATORY_CARE_PROVIDER_SITE_OTHER): Admitting: Physical Therapy

## 2024-03-30 ENCOUNTER — Encounter: Payer: Self-pay | Admitting: Physical Therapy

## 2024-03-30 DIAGNOSIS — M6281 Muscle weakness (generalized): Secondary | ICD-10-CM

## 2024-03-30 DIAGNOSIS — M25552 Pain in left hip: Secondary | ICD-10-CM | POA: Diagnosis not present

## 2024-03-30 DIAGNOSIS — M5459 Other low back pain: Secondary | ICD-10-CM

## 2024-03-30 NOTE — Patient Instructions (Signed)
 Access Code: [email or web address] URL: [email or web address] Date: 03/30/2024 Prepared by: Elaine Daring  Exercises - Figure 4 Bridge  - 1 x daily - 3 sets - 10 reps - Supine 90/90 with Leg Extensions  - 1 x daily - 3 sets - 10 reps - Clam with Resistance  - 1 x daily - 3 sets - 10 reps - Squat with Chair Touch  - 1 x daily - 3 sets - 10 reps - Isometric Gluteus Medius at Wall  - 1 x daily - 5 reps - 15 seconds hold - Side Stepping with Resistance at Thighs  - 1 x daily - 3 sets - 20 reps

## 2024-03-30 NOTE — Therapy (Signed)
 OUTPATIENT PHYSICAL THERAPY TREATMENT   Patient Name: Tammy [name] MRN: [id number] DOB:09-29-1960, 63 y.o., female Today's Date: 03/30/2024   END OF SESSION:  PT End of Session - 03/30/24 0759     Visit Number 4    Number of Visits 16    Date for PT Re-Evaluation 04/19/24    Authorization Type [facility] [employer]    PT Start Time 0800    PT Stop Time 0840    PT Time Calculation (min) 40 min    Activity Tolerance Patient tolerated treatment well    Behavior During Therapy [facility] for tasks assessed/performed             Past Medical History:  Diagnosis Date   Arthritis    Chronic fatigue syndrome    Diverticulosis    External hemorrhoids    Hypertension    Internal hemorrhoids    Neuropathy    Spinal stenosis    Past Surgical History:  Procedure Laterality Date   APPENDECTOMY     CARDIAC CATHETERIZATION  09/18/2012   LAPAROSCOPIC APPENDECTOMY N/A 01/22/2023   Procedure: APPENDECTOMY LAPAROSCOPIC;  Surgeon: Teresa Lonni HERO, MD;  Location: [facility];  Service: General;  Laterality: N/A;   LEFT HEART CATHETERIZATION WITH CORONARY ANGIOGRAM N/A 09/18/2012   Procedure: LEFT HEART CATHETERIZATION WITH CORONARY ANGIOGRAM;  Surgeon: Ozell Fell, MD;  Location: [facility] CATH LAB;  Service: Cardiovascular;  Laterality: N/A;   TONSILLECTOMY  09/21/1963   Patient Active Problem List   Diagnosis Date Noted   Prediabetes 12/28/2023   S/P laparoscopic appendectomy 01/22/2023   Unilateral primary osteoarthritis, left hip 06/25/2022   Hip flexor tendon tightness, left 04/02/2022   Greater trochanteric bursitis of left hip 01/11/2022   Anxiety 07/24/2020   Nonallopathic lesion of cervical region 04/03/2020   Nonallopathic lesion of thoracic region 04/03/2020   Nonallopathic lesion of lumbar region 04/03/2020   Cervical stenosis of spine 03/31/2020   Attention deficit disorder (ADD) in adult 03/31/2020   Low bone density 03/17/2020   Major depressive disorder in full remission (HCC)  02/15/2020   GERD (gastroesophageal reflux disease) 02/15/2020   Allergic rhinitis 03/09/2016   Essential hypertension 02/18/2014   Chronic pain syndrome 02/22/2013   Chronic insomnia 02/22/2013   Paresthesias 10/25/2012   Hyperlipidemia 09/18/2012    PCP: Katrinka Garnette KIDD, MD  REFERRING PROVIDER: Claudene Arthea HERO, DO  REFERRING DIAG: Left hip pain  THERAPY DIAG:  Pain in left hip  Other low back pain  Muscle weakness (generalized)  Rationale for Evaluation and Treatment: Rehabilitation  ONSET DATE: Chronic   SUBJECTIVE:  SUBJECTIVE STATEMENT: Patient reports she is doing well. She states she hasn't taken any walks since last visit and has done her exercises once. She will be going on vacation the next week.  Eval: Patient reports left hip and leg pain that has been going on for a while. She used to walk an hour a day but now can't take walks so she has been doing more swimming. She reports a arthritic hip but also some sine issues that could be contributing to pain going down the leg. She has had shots in the bursa of the leg hip but that has done nothing for her pain. She also had lumbar injection, the first one helped but the most recent one didn't do anything. She does occasionally get a catching pain in her lower back but it is not constant.The left hip and leg pain are more constant and were waking her up at night. She  did have previous PT and the dry needling did not seem to help, and the other treatments did not seem to resolve the issue. Currently her most comfortable positions are lying down or standing, sitting or lying on left side seems to aggravate her pain in addition to walking and stairs. She states she has difficulty leading with the left leg going up stairs. She does take Tylenol  when she needs to.   PERTINENT HISTORY: See PMH above  PAIN:  Are you having pain? Yes:  NPRS scale: 4/10 currently, 7/10 at worst Pain location: Left hip and leg Pain  description: Sore, sharp, burning, catching/stabbing Aggravating factors: Walking, sitting, lying on left side, going up stairs Relieving factors: Lying down, standing  PRECAUTIONS: None  PATIENT GOALS: Be able to take long walks, at least 30-45 minutes   OBJECTIVE:  Note: Objective measures were completed at Evaluation unless otherwise noted. PATIENT SURVEYS:  PSFS: 6 Walking 30-60 minutes: 8 Stairs: 6 Sitting comfortably: 5 Weight training safely: 5  MUSCLE LENGTH: Hamstring limitation bilaterally [facility], patient reports lateral hip and low back discomfort  PALPATION: Tenderness to left greater trochanteric region  Lumbar CPAs grossly WFL, patient reports localized soreness  LOWER EXTREMITY ROM:   Hip PROM grossly WFL    Lumbar ROM grossly WFL  LOWER EXTREMITY MMT:  MMT Right eval Left eval Left 02/27/2024 Left 03/30/2024  Hip flexion 5 4    Hip extension 4- 4-    Hip abduction 4 4- 4- 4-  Hip adduction      Hip internal rotation      Hip external rotation      Knee flexion 5 4    Knee extension 5 5    Ankle dorsiflexion      Ankle plantarflexion      Ankle inversion      Ankle eversion       (Blank rows = not tested)  FUNCTIONAL TESTS:  DLLT: 60 deg Squat: grossly WFL with cueing  GAIT: Assistive device utilized: None Level of assistance: Complete Independence Comments: Trendelenburg                                                                                                                               TREATMENT  [facility] Adult PT Treatment:                                                DATE: 03/30/2024 Recumbent bike L4 x 5 min to improve endurance and workload capacity Figure-4 bridge 2 x 8 each 90-90 alternating leg extension 2 x 10 Side clamshell with red 3 x 10 each Squat to table tap holding 15# 3 x 10 Standing hip abductor isometric at the wall 5 x 15 sec each Lateral band walk with red at knees 2 x 20  down/back Deadlift with  20# 3 x 10 Forward 6 runner step-up 2 x 10 each  Discussed walking program, explained pain scale and walking at a point where pain does not increase more and 3 points on pain scale.  PATIENT EDUCATION:  Education details: HEP update Person educated: Patient Education method: Explanation, Demonstration, Tactile cues, Verbal cues, Handout Education comprehension: verbalized understanding, returned demonstration, verbal cues required, tactile cues required, and needs further education  HOME EXERCISE PROGRAM: Access Code: [email or web address]   ASSESSMENT: CLINICAL IMPRESSION: Patient tolerated therapy well with no adverse effects. Therapy focused on continued strengthening for her core, hips, and back with good tolerance. She does report muscular fatigue with her exercises, especially following the hip abductor isometric exercise. She demonstrates good technique with her lifting and step-up exercise. Encouraged patient in consistency with HEP and walking program. Provided patient updated HEP she can complete while on vacation. Patient would benefit from continued skilled PT to progress mobility and strength in order to reduce pain and maximize functional ability.   Eval: Patient is a 63 y.o. female who was seen today for physical therapy evaluation and treatment for chronic left hip and leg pain. 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 She does exhibit some left hip weakness compared to the right and tenderness over the left greater trochanter and hip musculature.   OBJECTIVE IMPAIRMENTS: decreased activity tolerance, decreased strength, impaired flexibility, and pain.   ACTIVITY LIMITATIONS: lifting, sitting, squatting, sleeping, stairs, and locomotion level  PARTICIPATION LIMITATIONS: shopping, community activity, and occupation  PERSONAL FACTORS: Fitness, Past/current experiences, and Time since onset of injury/illness/exacerbation  are also affecting patient's functional outcome.    GOALS: Goals reviewed with patient? Yes  SHORT TERM GOALS: Target date: 03/22/2024  Patient will be I with initial HEP in order to progress with therapy. Baseline: HEP provided at eval 03/26/2024: independent with initial HEP Goal status: MET  2.  Patient will report left hip pain </= 5/10 with activity in order to reduce functional limitations Baseline: 7/10 pain 03/26/2024: continues to report pain with walking Goal status: ONGOING  LONG TERM GOALS: Target date: 04/19/2024  Patient will be I with final HEP to maintain progress from PT. Baseline: HEP provided at eval Goal status: INITIAL  2.  Patient will report PSFS >/= 8 in order to indicate an improvement in functional ability Baseline: 6 Goal status: INITIAL  3.  Patient will demonstrate hip strength >/= 4+/5 MMT in order to improve her walking tolerance and stair negotiation Baseline: see limitations above Goal status: INITIAL  4.  Patient will report left hip pain </= 3/10 with activity in order to reduce functional limitations Baseline: 7/10 pain Goal status: INITIAL   PLAN: PT FREQUENCY: 1-2x/week  PT DURATION: 8 weeks  PLANNED INTERVENTIONS: 97164- PT Re-evaluation, 97750- Physical Performance Testing, 97110-Therapeutic exercises, 97530- Therapeutic activity, 97112- Neuromuscular re-education, 97535- Self Care, 02859- Manual therapy, [phone]- Ionotophoresis 4mg /ml Dexamethasone , 79439 (1-2 muscles), 20561 (3+ muscles)- Dry Needling, Patient/Family education, Balance training, Stair training, Taping, Joint mobilization, Joint manipulation, Spinal manipulation, Spinal mobilization, Cryotherapy, and Moist heat  PLAN FOR NEXT SESSION: Review HEP and progress PRN, focus on hip strengthening and control, progressing to closed chain exercises    Elaine Daring, PT, DPT, LAT, ATC 03/30/24  8:46 AM Phone: [phone] Fax: [phone]

## 2024-04-09 ENCOUNTER — Encounter: Admitting: Physical Therapy

## 2024-04-09 DIAGNOSIS — R632 Polyphagia: Secondary | ICD-10-CM | POA: Diagnosis not present

## 2024-04-09 DIAGNOSIS — E663 Overweight: Secondary | ICD-10-CM | POA: Diagnosis not present

## 2024-04-09 DIAGNOSIS — R7303 Prediabetes: Secondary | ICD-10-CM | POA: Diagnosis not present

## 2024-04-09 DIAGNOSIS — Z6829 Body mass index (BMI) 29.0-29.9, adult: Secondary | ICD-10-CM | POA: Diagnosis not present

## 2024-04-09 DIAGNOSIS — F50811 Binge eating disorder, moderate: Secondary | ICD-10-CM | POA: Diagnosis not present

## 2024-04-10 ENCOUNTER — Ambulatory Visit: Admitting: Family Medicine

## 2024-04-11 ENCOUNTER — Other Ambulatory Visit (HOSPITAL_COMMUNITY): Payer: Self-pay

## 2024-04-12 ENCOUNTER — Ambulatory Visit: Admitting: Behavioral Health

## 2024-04-17 ENCOUNTER — Ambulatory Visit (INDEPENDENT_AMBULATORY_CARE_PROVIDER_SITE_OTHER): Admitting: Physical Therapy

## 2024-04-17 ENCOUNTER — Other Ambulatory Visit: Payer: Self-pay

## 2024-04-17 ENCOUNTER — Encounter: Payer: Self-pay | Admitting: Physical Therapy

## 2024-04-17 DIAGNOSIS — M6281 Muscle weakness (generalized): Secondary | ICD-10-CM | POA: Diagnosis not present

## 2024-04-17 DIAGNOSIS — M25552 Pain in left hip: Secondary | ICD-10-CM | POA: Diagnosis not present

## 2024-04-17 DIAGNOSIS — M5459 Other low back pain: Secondary | ICD-10-CM | POA: Diagnosis not present

## 2024-04-17 NOTE — Therapy (Signed)
 OUTPATIENT PHYSICAL THERAPY TREATMENT   Patient Name: Tammy [name] MRN: [id number] DOB:[birth year], 63 y.o., female Today's Date: 04/17/2024   END OF SESSION:  PT End of Session - 04/17/24 0803     Visit Number 5    Number of Visits 16    Date for PT Re-Evaluation 04/19/24    Authorization Type [facility] [employer]    PT Start Time 0800    PT Stop Time 0840    PT Time Calculation (min) 40 min    Activity Tolerance Patient tolerated treatment well    Behavior During Therapy [facility] for tasks assessed/performed              Past Medical History:  Diagnosis Date   Arthritis    Chronic fatigue syndrome    Diverticulosis    External hemorrhoids    Hypertension    Internal hemorrhoids    Neuropathy    Spinal stenosis    Past Surgical History:  Procedure Laterality Date   APPENDECTOMY     CARDIAC CATHETERIZATION  09/18/2012   LAPAROSCOPIC APPENDECTOMY N/A 01/22/2023   Procedure: APPENDECTOMY LAPAROSCOPIC;  Surgeon: Teresa Lonni HERO, MD;  Location: [facility] OR;  Service: General;  Laterality: N/A;   LEFT HEART CATHETERIZATION WITH CORONARY ANGIOGRAM N/A 09/18/2012   Procedure: LEFT HEART CATHETERIZATION WITH CORONARY ANGIOGRAM;  Surgeon: Ozell Fell, MD;  Location: [facility] CATH LAB;  Service: Cardiovascular;  Laterality: N/A;   TONSILLECTOMY  09/21/1963   Patient Active Problem List   Diagnosis Date Noted   Prediabetes 12/28/2023   S/P laparoscopic appendectomy 01/22/2023   Unilateral primary osteoarthritis, left hip 06/25/2022   Hip flexor tendon tightness, left 04/02/2022   Greater trochanteric bursitis of left hip 01/11/2022   Anxiety 07/24/2020   Nonallopathic lesion of cervical region 04/03/2020   Nonallopathic lesion of thoracic region 04/03/2020   Nonallopathic lesion of lumbar region 04/03/2020   Cervical stenosis of spine 03/31/2020   Attention deficit disorder (ADD) in adult 03/31/2020   Low bone density 03/17/2020   Major depressive disorder in full remission (HCC)  02/15/2020   GERD (gastroesophageal reflux disease) 02/15/2020   Allergic rhinitis 03/09/2016   Essential hypertension 02/18/2014   Chronic pain syndrome 02/22/2013   Chronic insomnia 02/22/2013   Paresthesias 10/25/2012   Hyperlipidemia 09/18/2012    PCP: Katrinka Garnette KIDD, MD  REFERRING PROVIDER: Claudene Arthea HERO, DO  REFERRING DIAG: Left hip pain  THERAPY DIAG:  Pain in left hip  Other low back pain  Muscle weakness (generalized)  Rationale for Evaluation and Treatment: Rehabilitation  ONSET DATE: Chronic   SUBJECTIVE:  SUBJECTIVE STATEMENT: Patient reports she is doing well but has still been having the left hip and back discomfort. Still about the same with walking.  Eval: Patient reports left hip and leg pain that has been going on for a while. She used to walk an hour a day but now can't take walks so she has been doing more swimming. She reports a arthritic hip but also some sine issues that could be contributing to pain going down the leg. She has had shots in the bursa of the leg hip but that has done nothing for her pain. She also had lumbar injection, the first one helped but the most recent one didn't do anything. She does occasionally get a catching pain in her lower back but it is not constant.The left hip and leg pain are more constant and were waking her up at night. She did have previous PT and the dry  needling did not seem to help, and the other treatments did not seem to resolve the issue. Currently her most comfortable positions are lying down or standing, sitting or lying on left side seems to aggravate her pain in addition to walking and stairs. She states she has difficulty leading with the left leg going up stairs. She does take Tylenol  when she needs to.   PERTINENT HISTORY: See PMH above  PAIN:  Are you having pain? Yes:  NPRS scale: 4/10 currently, 7/10 at worst Pain location: Left hip and leg Pain description: Sore, sharp, burning,  catching/stabbing Aggravating factors: Walking, sitting, lying on left side, going up stairs Relieving factors: Lying down, standing  PRECAUTIONS: None  PATIENT GOALS: Be able to take long walks, at least 30-45 minutes   OBJECTIVE:  Note: Objective measures were completed at Evaluation unless otherwise noted. PATIENT SURVEYS:  PSFS: 6 Walking 30-60 minutes: 8 Stairs: 6 Sitting comfortably: 5 Weight training safely: 5  MUSCLE LENGTH: Hamstring limitation bilaterally [facility], patient reports lateral hip and low back discomfort  PALPATION: Tenderness to left greater trochanteric region  Lumbar CPAs grossly WFL, patient reports localized soreness  LOWER EXTREMITY ROM:   Hip PROM grossly [facility]    Lumbar ROM grossly [facility]  LOWER EXTREMITY MMT:  MMT Right eval Left eval Left 02/27/2024 Left 03/30/2024 Left 04/17/2024  Hip flexion 5 4     Hip extension 4- 4-   4-  Hip abduction 4 4- 4- 4- 4-  Hip adduction       Hip internal rotation       Hip external rotation       Knee flexion 5 4     Knee extension 5 5     Ankle dorsiflexion       Ankle plantarflexion       Ankle inversion       Ankle eversion        (Blank rows = not tested)  FUNCTIONAL TESTS:  DLLT: 60 deg Squat: grossly WFL with cueing  GAIT: Assistive device utilized: None Level of assistance: Complete Independence Comments: Trendelenburg                                                                                                                               TREATMENT  [facility] Adult PT Treatment:                                                DATE: 04/17/2024 Recumbent bike L4 x 5 min to improve endurance and workload capacity LTR x 10 Piriformis stretch 3 x 20 sec each Figure-4 bridge 3 x 8 each 90-90 alternating leg extension 2 x 10 Deadlift with 30# 3 x 8 Lateral band walk with green at knees 3 x 20 down/back Forward 8 runner step-up 2  x 10 each  PATIENT EDUCATION:  Education details:  HEP Person educated: Patient Education method: Explanation, Demonstration, Actor cues, Verbal cues Education comprehension: verbalized understanding, returned demonstration, verbal cues required, tactile cues required, and needs further education  HOME EXERCISE PROGRAM: Access Code: [email or web address]   ASSESSMENT: CLINICAL IMPRESSION: Patient tolerated therapy well with no adverse effects. Therapy focused on progressing hip and core strengthening with good tolerance. She was able to increase weight with her lifting and banded resistance for hip strengthening. She does report muscular fatigue post exercise. No changes made to her HEP but provided patient with stronger band for exercises at home. Patient would benefit from continued skilled PT to progress mobility and strength in order to reduce pain and maximize functional ability.   Eval: Patient is a 63 y.o. female who was seen today for physical therapy evaluation and treatment for chronic left hip and leg pain. 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 She does exhibit some left hip weakness compared to the right and tenderness over the left greater trochanter and hip musculature.   OBJECTIVE IMPAIRMENTS: decreased activity tolerance, decreased strength, impaired flexibility, and pain.   ACTIVITY LIMITATIONS: lifting, sitting, squatting, sleeping, stairs, and locomotion level  PARTICIPATION LIMITATIONS: shopping, community activity, and occupation  PERSONAL FACTORS: Fitness, Past/current experiences, and Time since onset of injury/illness/exacerbation are also affecting patient's functional outcome.    GOALS: Goals reviewed with patient? Yes  SHORT TERM GOALS: Target date: 03/22/2024  Patient will be I with initial HEP in order to progress with therapy. Baseline: HEP provided at eval 03/26/2024: independent with initial HEP Goal status: MET  2.  Patient will report left hip  pain </= 5/10 with activity in order to reduce functional limitations Baseline: 7/10 pain 03/26/2024: continues to report pain with walking Goal status: ONGOING  LONG TERM GOALS: Target date: 04/19/2024  Patient will be I with final HEP to maintain progress from PT. Baseline: HEP provided at eval Goal status: INITIAL  2.  Patient will report PSFS >/= 8 in order to indicate an improvement in functional ability Baseline: 6 Goal status: INITIAL  3.  Patient will demonstrate hip strength >/= 4+/5 MMT in order to improve her walking tolerance and stair negotiation Baseline: see limitations above Goal status: INITIAL  4.  Patient will report left hip pain </= 3/10 with activity in order to reduce functional limitations Baseline: 7/10 pain Goal status: INITIAL   PLAN: PT FREQUENCY: 1-2x/week  PT DURATION: 8 weeks  PLANNED INTERVENTIONS: 97164- PT Re-evaluation, 97750- Physical Performance Testing, 97110-Therapeutic exercises, 97530- Therapeutic activity, 97112- Neuromuscular re-education, 97535- Self Care, 02859- Manual therapy, [phone]- Ionotophoresis 4mg /ml Dexamethasone , 79439 (1-2 muscles), 20561 (3+ muscles)- Dry Needling, Patient/Family education, Balance training, Stair training, Taping, Joint mobilization, Joint manipulation, Spinal manipulation, Spinal mobilization, Cryotherapy, and Moist heat  PLAN FOR NEXT SESSION: Review HEP and progress PRN, focus on hip strengthening and control, progressing to closed chain exercises    Elaine Daring, PT, DPT, LAT, ATC 04/17/24  8:44 AM Phone: [phone] Fax: [phone]

## 2024-04-20 ENCOUNTER — Encounter: Payer: Self-pay | Admitting: Physical Therapy

## 2024-04-20 ENCOUNTER — Ambulatory Visit (INDEPENDENT_AMBULATORY_CARE_PROVIDER_SITE_OTHER): Admitting: Physical Therapy

## 2024-04-20 ENCOUNTER — Other Ambulatory Visit: Payer: Self-pay

## 2024-04-20 DIAGNOSIS — M6281 Muscle weakness (generalized): Secondary | ICD-10-CM

## 2024-04-20 DIAGNOSIS — M5459 Other low back pain: Secondary | ICD-10-CM | POA: Diagnosis not present

## 2024-04-20 DIAGNOSIS — M25552 Pain in left hip: Secondary | ICD-10-CM | POA: Diagnosis not present

## 2024-04-20 NOTE — Patient Instructions (Signed)
 Access Code: [email or web address] URL: [email or web address] Date: 04/20/2024 Prepared by: Elaine Daring  Exercises - Figure 4 Bridge  - 1 x daily - 3 sets - 10 reps - Supine Dead Bug with Leg Extension  - 1 x daily - 3 sets - 10 reps - Clam with Resistance  - 1 x daily - 3 sets - 10 reps - Side Stepping with Resistance at Thighs  - 1 x daily - 3 sets - 20 reps - Deadlift with Resistance  - 1 x daily - 3 sets - 10 reps - Standing Anti-Rotation Press with Anchored Resistance  - 1 x daily - 2 sets - 10 reps

## 2024-04-20 NOTE — Therapy (Signed)
 OUTPATIENT PHYSICAL THERAPY TREATMENT   Patient Name: Tammy [name] MRN: [id number] DOB:12/20/1960, 63 y.o., female Today's Date: 04/20/2024   END OF SESSION:  PT End of Session - 04/20/24 0808     Visit Number 6    Number of Visits 22    Date for PT Re-Evaluation 06/15/24    Authorization Type [facility] [employer]    PT Start Time 0803    PT Stop Time 0841    PT Time Calculation (min) 38 min    Activity Tolerance Patient tolerated treatment well    Behavior During Therapy [facility] for tasks assessed/performed               Past Medical History:  Diagnosis Date   Arthritis    Chronic fatigue syndrome    Diverticulosis    External hemorrhoids    Hypertension    Internal hemorrhoids    Neuropathy    Spinal stenosis    Past Surgical History:  Procedure Laterality Date   APPENDECTOMY     CARDIAC CATHETERIZATION  09/18/2012   LAPAROSCOPIC APPENDECTOMY N/A 01/22/2023   Procedure: APPENDECTOMY LAPAROSCOPIC;  Surgeon: Teresa Lonni HERO, MD;  Location: [facility];  Service: General;  Laterality: N/A;   LEFT HEART CATHETERIZATION WITH CORONARY ANGIOGRAM N/A 09/18/2012   Procedure: LEFT HEART CATHETERIZATION WITH CORONARY ANGIOGRAM;  Surgeon: Ozell Fell, MD;  Location: [facility] CATH LAB;  Service: Cardiovascular;  Laterality: N/A;   TONSILLECTOMY  09/21/1963   Patient Active Problem List   Diagnosis Date Noted   Prediabetes 12/28/2023   S/P laparoscopic appendectomy 01/22/2023   Unilateral primary osteoarthritis, left hip 06/25/2022   Hip flexor tendon tightness, left 04/02/2022   Greater trochanteric bursitis of left hip 01/11/2022   Anxiety 07/24/2020   Nonallopathic lesion of cervical region 04/03/2020   Nonallopathic lesion of thoracic region 04/03/2020   Nonallopathic lesion of lumbar region 04/03/2020   Cervical stenosis of spine 03/31/2020   Attention deficit disorder (ADD) in adult 03/31/2020   Low bone density 03/17/2020   Major depressive disorder in full remission (HCC)  02/15/2020   GERD (gastroesophageal reflux disease) 02/15/2020   Allergic rhinitis 03/09/2016   Essential hypertension 02/18/2014   Chronic pain syndrome 02/22/2013   Chronic insomnia 02/22/2013   Paresthesias 10/25/2012   Hyperlipidemia 09/18/2012    PCP: Katrinka Garnette KIDD, MD  REFERRING PROVIDER: Claudene Arthea HERO, DO  REFERRING DIAG: Left hip pain  THERAPY DIAG:  Pain in left hip  Other low back pain  Muscle weakness (generalized)  Rationale for Evaluation and Treatment: Rehabilitation  ONSET DATE: Chronic   SUBJECTIVE:  SUBJECTIVE STATEMENT: Patient reports she has been very good with her exercises. She has been more consistent with her walking. She does report that she had a full day of in person working on Wednesday and that night she was having more pain that was waking her up at night.   Eval: Patient reports left hip and leg pain that has been going on for a while. She used to walk an hour a day but now can't take walks so she has been doing more swimming. She reports a arthritic hip but also some sine issues that could be contributing to pain going down the leg. She has had shots in the bursa of the leg hip but that has done nothing for her pain. She also had lumbar injection, the first one helped but the most recent one didn't do anything. She does occasionally get a catching pain in her lower back but  it is not constant.The left hip and leg pain are more constant and were waking her up at night. She did have previous PT and the dry needling did not seem to help, and the other treatments did not seem to resolve the issue. Currently her most comfortable positions are lying down or standing, sitting or lying on left side seems to aggravate her pain in addition to walking and stairs. She states she has difficulty leading with the left leg going up stairs. She does take Tylenol  when she needs to.   PERTINENT HISTORY: See PMH above  PAIN:  Are you having pain? Yes:  NPRS  scale: 4/10 currently, 7/10 at worst Pain location: Left hip and leg Pain description: Sore, sharp, burning, catching/stabbing Aggravating factors: Walking, sitting, lying on left side, going up stairs Relieving factors: Lying down, standing  PRECAUTIONS: None  PATIENT GOALS: Be able to take long walks, at least 30-45 minutes   OBJECTIVE:  Note: Objective measures were completed at Evaluation unless otherwise noted. PATIENT SURVEYS:  PSFS: 6 Walking 30-60 minutes: 8 Stairs: 6 Sitting comfortably: 5 Weight training safely: 5  MUSCLE LENGTH: Hamstring limitation bilaterally Thomas test [facility], patient reports lateral hip and low back discomfort  PALPATION: Tenderness to left greater trochanteric region  Lumbar CPAs grossly WFL, patient reports localized soreness  LOWER EXTREMITY ROM:   Hip PROM grossly [facility]    Lumbar ROM grossly [facility]  LOWER EXTREMITY MMT:  MMT Right eval Left eval Left 02/27/2024 Left 03/30/2024 Left 04/17/2024  Hip flexion 5 4     Hip extension 4- 4-   4-  Hip abduction 4 4- 4- 4- 4-  Hip adduction       Hip internal rotation       Hip external rotation       Knee flexion 5 4     Knee extension 5 5     Ankle dorsiflexion       Ankle plantarflexion       Ankle inversion       Ankle eversion        (Blank rows = not tested)  FUNCTIONAL TESTS:  DLLT: 60 deg Squat: grossly WFL with cueing  GAIT: Assistive device utilized: None Level of assistance: Complete Independence Comments: Trendelenburg                                                                                                                               TREATMENT  [facility] Adult PT Treatment:                                                DATE: 04/20/2024 Recumbent bike L4 x 5 min to improve endurance and workload capacity Piriformis stretch 3 x 20 sec each Figure-4 bridge 2 x 10 each Dead bug 3 x 10  Deadlift with 30# 3 x 10 Deadlift with black band for HEP demo Pallof press with  black 2 x 10 each Forward 8 runner step-up 2 x 10 each Side step-up-and-over with 8 box x 10  PATIENT EDUCATION:  Education details: POC extension, HEP update Person educated: Patient Education method: Explanation, Demonstration, Tactile cues, Verbal cues, Handout Education comprehension: verbalized understanding, returned demonstration, verbal cues required, tactile cues required, and needs further education  HOME EXERCISE PROGRAM: Access Code: [email or web address]   ASSESSMENT: CLINICAL IMPRESSION: Patient tolerated therapy well with no adverse effects. Therapy continued to focus on progress her core stabilization and band and hip strengthening. She does seem to be progressing well with her exercises and was able to incorporate more upright core exercises this visit. Updated her HEP to further progress core and hip strengthening for home. Patient would benefit from continued skilled PT to progress mobility and strength in order to reduce pain and maximize functional ability, so will extend PT POC for 8 more weeks.   Eval: Patient is a 63 y.o. female who was seen today for physical therapy evaluation and treatment for chronic left hip and leg pain. 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 She does exhibit some left hip weakness compared to the right and tenderness over the left greater trochanter and hip musculature.   OBJECTIVE IMPAIRMENTS: decreased activity tolerance, decreased strength, impaired flexibility, and pain.   ACTIVITY LIMITATIONS: lifting, sitting, squatting, sleeping, stairs, and locomotion level  PARTICIPATION LIMITATIONS: shopping, community activity, and occupation  PERSONAL FACTORS: Fitness, Past/current experiences, and Time since onset of injury/illness/exacerbation are also affecting patient's functional outcome.    GOALS: Goals reviewed with patient? Yes  SHORT TERM GOALS: Target date:  03/22/2024  Patient will be I with initial HEP in order to progress with therapy. Baseline: HEP provided at eval 03/26/2024: independent with initial HEP Goal status: MET  2.  Patient will report left hip pain </= 5/10 with activity in order to reduce functional limitations Baseline: 7/10 pain 03/26/2024: continues to report pain with walking Goal status: ONGOING  LONG TERM GOALS: Target date: 06/15/2024  Patient will be I with final HEP to maintain progress from PT. Baseline: HEP provided at eval 04/20/2024: progressing Goal status: ONGOING  2.  Patient will report PSFS >/= 8 in order to indicate an improvement in functional ability Baseline: 6 04/20/2024: not assessed Goal status: DEFERRED  3.  Patient will demonstrate hip strength >/= 4+/5 MMT in order to improve her walking tolerance and stair negotiation Baseline: see limitations above 04/20/2024: see above Goal status: ONGOING  4.  Patient will report left hip pain </= 3/10 with activity in order to reduce functional limitations Baseline: 7/10 pain 04/20/2024: patient continues to report increase in left sided pain Goal status: ONGOING   PLAN: PT FREQUENCY: 1-2x/week  PT DURATION: 8 weeks  PLANNED INTERVENTIONS: 97164- PT Re-evaluation, 97750- Physical Performance Testing, 97110-Therapeutic exercises, 97530- Therapeutic activity, 97112- Neuromuscular re-education, 97535- Self Care, 02859- Manual therapy, [phone]- Ionotophoresis 4mg /ml Dexamethasone , 79439 (1-2 muscles), 20561 (3+ muscles)- Dry Needling, Patient/Family education, Balance training, Stair training, Taping, Joint mobilization, Joint manipulation, Spinal manipulation, Spinal mobilization, Cryotherapy, and Moist heat  PLAN FOR NEXT SESSION: Review HEP and progress PRN, focus on hip strengthening and control, progressing to closed chain exercises    Elaine Daring, PT, DPT, LAT, ATC 04/20/24  8:50 AM Phone: [phone] Fax: [phone]

## 2024-04-24 ENCOUNTER — Ambulatory Visit (INDEPENDENT_AMBULATORY_CARE_PROVIDER_SITE_OTHER): Admitting: Physical Therapy

## 2024-04-24 ENCOUNTER — Other Ambulatory Visit: Payer: Self-pay

## 2024-04-24 ENCOUNTER — Encounter: Payer: Self-pay | Admitting: Physical Therapy

## 2024-04-24 DIAGNOSIS — M6281 Muscle weakness (generalized): Secondary | ICD-10-CM | POA: Diagnosis not present

## 2024-04-24 DIAGNOSIS — M25552 Pain in left hip: Secondary | ICD-10-CM

## 2024-04-24 DIAGNOSIS — M5459 Other low back pain: Secondary | ICD-10-CM

## 2024-04-24 NOTE — Patient Instructions (Signed)
 Access Code: [email or web address] URL: [email or web address] Date: 04/24/2024 Prepared by: Elaine Daring  Exercises - Marching Bridge  - 1 x daily - 3 sets - 10 reps - Supine Dead Bug with Leg Extension  - 1 x daily - 3 sets - 10 reps - Clam with Resistance  - 1 x daily - 3 sets - 10 reps - Side Stepping with Resistance at Thighs  - 1 x daily - 3 sets - 20 reps - Deadlift with Resistance  - 1 x daily - 3 sets - 10 reps

## 2024-04-24 NOTE — Therapy (Signed)
 OUTPATIENT PHYSICAL THERAPY TREATMENT   Patient Name: Tammy [name] MRN: [id number] DOB:1960/10/18, 63 y.o., female Today's Date: 04/24/2024   END OF SESSION:  PT End of Session - 04/24/24 0807     Visit Number 7    Number of Visits 22    Date for PT Re-Evaluation 06/15/24    Authorization Type [facility] [employer]    PT Start Time 0800    PT Stop Time 0840    PT Time Calculation (min) 40 min    Activity Tolerance Patient tolerated treatment well    Behavior During Therapy [facility] for tasks assessed/performed                Past Medical History:  Diagnosis Date   Arthritis    Chronic fatigue syndrome    Diverticulosis    External hemorrhoids    Hypertension    Internal hemorrhoids    Neuropathy    Spinal stenosis    Past Surgical History:  Procedure Laterality Date   APPENDECTOMY     CARDIAC CATHETERIZATION  09/18/2012   LAPAROSCOPIC APPENDECTOMY N/A 01/22/2023   Procedure: APPENDECTOMY LAPAROSCOPIC;  Surgeon: Teresa Lonni HERO, MD;  Location: [facility] OR;  Service: General;  Laterality: N/A;   LEFT HEART CATHETERIZATION WITH CORONARY ANGIOGRAM N/A 09/18/2012   Procedure: LEFT HEART CATHETERIZATION WITH CORONARY ANGIOGRAM;  Surgeon: Ozell Fell, MD;  Location: [facility] CATH LAB;  Service: Cardiovascular;  Laterality: N/A;   TONSILLECTOMY  09/21/1963   Patient Active Problem List   Diagnosis Date Noted   Prediabetes 12/28/2023   S/P laparoscopic appendectomy 01/22/2023   Unilateral primary osteoarthritis, left hip 06/25/2022   Hip flexor tendon tightness, left 04/02/2022   Greater trochanteric bursitis of left hip 01/11/2022   Anxiety 07/24/2020   Nonallopathic lesion of cervical region 04/03/2020   Nonallopathic lesion of thoracic region 04/03/2020   Nonallopathic lesion of lumbar region 04/03/2020   Cervical stenosis of spine 03/31/2020   Attention deficit disorder (ADD) in adult 03/31/2020   Low bone density 03/17/2020   Major depressive disorder in full remission (HCC)  02/15/2020   GERD (gastroesophageal reflux disease) 02/15/2020   Allergic rhinitis 03/09/2016   Essential hypertension 02/18/2014   Chronic pain syndrome 02/22/2013   Chronic insomnia 02/22/2013   Paresthesias 10/25/2012   Hyperlipidemia 09/18/2012    PCP: Katrinka Garnette KIDD, MD  REFERRING PROVIDER: Claudene Arthea HERO, DO  REFERRING DIAG: Left hip pain  THERAPY DIAG:  Pain in left hip  Other low back pain  Muscle weakness (generalized)  Rationale for Evaluation and Treatment: Rehabilitation  ONSET DATE: Chronic   SUBJECTIVE:  SUBJECTIVE STATEMENT: Patient reports she has been doing well.   Eval: Patient reports left hip and leg pain that has been going on for a while. She used to walk an hour a day but now can't take walks so she has been doing more swimming. She reports a arthritic hip but also some sine issues that could be contributing to pain going down the leg. She has had shots in the bursa of the leg hip but that has done nothing for her pain. She also had lumbar injection, the first one helped but the most recent one didn't do anything. She does occasionally get a catching pain in her lower back but it is not constant.The left hip and leg pain are more constant and were waking her up at night. She did have previous PT and the dry needling did not seem to help, and the other treatments did not seem  to resolve the issue. Currently her most comfortable positions are lying down or standing, sitting or lying on left side seems to aggravate her pain in addition to walking and stairs. She states she has difficulty leading with the left leg going up stairs. She does take Tylenol  when she needs to.   PERTINENT HISTORY: See PMH above  PAIN:  Are you having pain? Yes:  NPRS scale: 4/10 currently, 7/10 at worst Pain location: Left hip and leg Pain description: Sore, sharp, burning, catching/stabbing Aggravating factors: Walking, sitting, lying on left side, going up  stairs Relieving factors: Lying down, standing  PRECAUTIONS: None  PATIENT GOALS: Be able to take long walks, at least 30-45 minutes   OBJECTIVE:  Note: Objective measures were completed at Evaluation unless otherwise noted. PATIENT SURVEYS:  PSFS: 6 Walking 30-60 minutes: 8 Stairs: 6 Sitting comfortably: 5 Weight training safely: 5  MUSCLE LENGTH: Hamstring limitation bilaterally Thomas test [facility], patient reports lateral hip and low back discomfort  PALPATION: Tenderness to left greater trochanteric region  Lumbar CPAs grossly WFL, patient reports localized soreness  LOWER EXTREMITY ROM:   Hip PROM grossly [facility]    Lumbar ROM grossly [facility]  LOWER EXTREMITY MMT:  MMT Right eval Left eval Left 02/27/2024 Left 03/30/2024 Left 04/17/2024 Left 04/24/2024  Hip flexion 5 4      Hip extension 4- 4-   4-   Hip abduction 4 4- 4- 4- 4- 4-  Hip adduction        Hip internal rotation        Hip external rotation        Knee flexion 5 4      Knee extension 5 5      Ankle dorsiflexion        Ankle plantarflexion        Ankle inversion        Ankle eversion         (Blank rows = not tested)  FUNCTIONAL TESTS:  DLLT: 60 deg Squat: grossly WFL with cueing  GAIT: Assistive device utilized: None Level of assistance: Complete Independence Comments: Trendelenburg                                                                                                                               TREATMENT  [facility] Adult PT Treatment:                                                DATE: 04/24/2024 Recumbent bike L4 x 5 min to improve endurance and workload capacity Marching bridge 2 x 10 each Dead bug 2 x 10 Sidelying hip abduction 2 x 15 each Goblet squat to table tap with 15# 3 x 10 Deadlift with 30# 3 x 8 Pallof press with L2 powerband 2 x 10 each SLS  on Airex 3 x 30 sec each  PATIENT EDUCATION:  Education details: HEP update Person educated: Patient Education method: Explanation,  Demonstration, Tactile cues, Verbal cues, Handout Education comprehension: verbalized understanding, returned demonstration, verbal cues required, tactile cues required, and needs further education  HOME EXERCISE PROGRAM: Access Code: [email or web address]   ASSESSMENT: CLINICAL IMPRESSION: Patient tolerated therapy well with no adverse effects. Therapy focused on continued strengthening for the core and hips with good tolerance. She was able to progress with weighted squats and with her hip strengthening this visit. Incorporated some balance training on foam surface and she did require occasional UE support to maintain balance. Updated HEP to progress her hip and core strengthening. Patient would benefit from continued skilled PT to progress mobility and strength in order to reduce pain and maximize functional ability   Eval: Patient is a 63 y.o. female who was seen today for physical therapy evaluation and treatment for chronic left hip and leg pain. 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 She does exhibit some left hip weakness compared to the right and tenderness over the left greater trochanter and hip musculature.   OBJECTIVE IMPAIRMENTS: decreased activity tolerance, decreased strength, impaired flexibility, and pain.   ACTIVITY LIMITATIONS: lifting, sitting, squatting, sleeping, stairs, and locomotion level  PARTICIPATION LIMITATIONS: shopping, community activity, and occupation  PERSONAL FACTORS: Fitness, Past/current experiences, and Time since onset of injury/illness/exacerbation are also affecting patient's functional outcome.    GOALS: Goals reviewed with patient? Yes  SHORT TERM GOALS: Target date: 03/22/2024  Patient will be I with initial HEP in order to progress with therapy. Baseline: HEP provided at eval 03/26/2024: independent with initial HEP Goal status: MET  2.  Patient will report left hip pain </= 5/10 with  activity in order to reduce functional limitations Baseline: 7/10 pain 03/26/2024: continues to report pain with walking Goal status: ONGOING  LONG TERM GOALS: Target date: 06/15/2024  Patient will be I with final HEP to maintain progress from PT. Baseline: HEP provided at eval 04/20/2024: progressing Goal status: ONGOING  2.  Patient will report PSFS >/= 8 in order to indicate an improvement in functional ability Baseline: 6 04/20/2024: not assessed Goal status: DEFERRED  3.  Patient will demonstrate hip strength >/= 4+/5 MMT in order to improve her walking tolerance and stair negotiation Baseline: see limitations above 04/20/2024: see above Goal status: ONGOING  4.  Patient will report left hip pain </= 3/10 with activity in order to reduce functional limitations Baseline: 7/10 pain 04/20/2024: patient continues to report increase in left sided pain Goal status: ONGOING   PLAN: PT FREQUENCY: 1-2x/week  PT DURATION: 8 weeks  PLANNED INTERVENTIONS: 97164- PT Re-evaluation, 97750- Physical Performance Testing, 97110-Therapeutic exercises, 97530- Therapeutic activity, 97112- Neuromuscular re-education, 97535- Self Care, 02859- Manual therapy, [phone]- Ionotophoresis 4mg /ml Dexamethasone , 79439 (1-2 muscles), 20561 (3+ muscles)- Dry Needling, Patient/Family education, Balance training, Stair training, Taping, Joint mobilization, Joint manipulation, Spinal manipulation, Spinal mobilization, Cryotherapy, and Moist heat  PLAN FOR NEXT SESSION: Review HEP and progress PRN, focus on hip strengthening and control, progressing to closed chain exercises    Elaine Daring, PT, DPT, LAT, ATC 04/24/24  8:43 AM Phone: [phone] Fax: [phone]

## 2024-04-25 NOTE — Therapy (Signed)
 OUTPATIENT PHYSICAL THERAPY TREATMENT   Patient Name: Tammy [name] MRN: [id number] DOB:17-Apr-1961, 63 y.o., female Today's Date: 04/27/2024   END OF SESSION:  PT End of Session - 04/27/24 0755     Visit Number 8    Number of Visits 22    Date for PT Re-Evaluation 06/15/24    Authorization Type [facility] [employer]    PT Start Time 0755    PT Stop Time 0835    PT Time Calculation (min) 40 min    Activity Tolerance Patient tolerated treatment well    Behavior During Therapy [facility] for tasks assessed/performed                 Past Medical History:  Diagnosis Date   Arthritis    Chronic fatigue syndrome    Diverticulosis    External hemorrhoids    Hypertension    Internal hemorrhoids    Neuropathy    Spinal stenosis    Past Surgical History:  Procedure Laterality Date   APPENDECTOMY     CARDIAC CATHETERIZATION  09/18/2012   LAPAROSCOPIC APPENDECTOMY N/A 01/22/2023   Procedure: APPENDECTOMY LAPAROSCOPIC;  Surgeon: Teresa Lonni HERO, MD;  Location: [facility];  Service: General;  Laterality: N/A;   LEFT HEART CATHETERIZATION WITH CORONARY ANGIOGRAM N/A 09/18/2012   Procedure: LEFT HEART CATHETERIZATION WITH CORONARY ANGIOGRAM;  Surgeon: Ozell Fell, MD;  Location: [facility] CATH LAB;  Service: Cardiovascular;  Laterality: N/A;   TONSILLECTOMY  09/21/1963   Patient Active Problem List   Diagnosis Date Noted   Prediabetes 12/28/2023   S/P laparoscopic appendectomy 01/22/2023   Unilateral primary osteoarthritis, left hip 06/25/2022   Hip flexor tendon tightness, left 04/02/2022   Greater trochanteric bursitis of left hip 01/11/2022   Anxiety 07/24/2020   Nonallopathic lesion of cervical region 04/03/2020   Nonallopathic lesion of thoracic region 04/03/2020   Nonallopathic lesion of lumbar region 04/03/2020   Cervical stenosis of spine 03/31/2020   Attention deficit disorder (ADD) in adult 03/31/2020   Low bone density 03/17/2020   Major depressive disorder in full remission  (HCC) 02/15/2020   GERD (gastroesophageal reflux disease) 02/15/2020   Allergic rhinitis 03/09/2016   Essential hypertension 02/18/2014   Chronic pain syndrome 02/22/2013   Chronic insomnia 02/22/2013   Paresthesias 10/25/2012   Hyperlipidemia 09/18/2012    PCP: Katrinka Garnette KIDD, MD  REFERRING PROVIDER: Claudene Arthea HERO, DO  REFERRING DIAG: Left hip pain  THERAPY DIAG:  Pain in left hip  Other low back pain  Muscle weakness (generalized)  Rationale for Evaluation and Treatment: Rehabilitation  ONSET DATE: Chronic   SUBJECTIVE:  SUBJECTIVE STATEMENT: Patient reports she is having some pain in the left leg today.   Eval: Patient reports left hip and leg pain that has been going on for a while. She used to walk an hour a day but now can't take walks so she has been doing more swimming. She reports a arthritic hip but also some sine issues that could be contributing to pain going down the leg. She has had shots in the bursa of the leg hip but that has done nothing for her pain. She also had lumbar injection, the first one helped but the most recent one didn't do anything. She does occasionally get a catching pain in her lower back but it is not constant.The left hip and leg pain are more constant and were waking her up at night. She did have previous PT and the dry needling did not seem to help, and  the other treatments did not seem to resolve the issue. Currently her most comfortable positions are lying down or standing, sitting or lying on left side seems to aggravate her pain in addition to walking and stairs. She states she has difficulty leading with the left leg going up stairs. She does take Tylenol  when she needs to.   PERTINENT HISTORY: See PMH above  PAIN:  Are you having pain? Yes:  NPRS scale: 4/10 currently, 7/10 at worst Pain location: Left hip and leg Pain description: Sore, sharp, burning, catching/stabbing Aggravating factors: Walking, sitting, lying on left  side, going up stairs Relieving factors: Lying down, standing  PRECAUTIONS: None  PATIENT GOALS: Be able to take long walks, at least 30-45 minutes   OBJECTIVE:  Note: Objective measures were completed at Evaluation unless otherwise noted. PATIENT SURVEYS:  PSFS: 6 Walking 30-60 minutes: 8 Stairs: 6 Sitting comfortably: 5 Weight training safely: 5  MUSCLE LENGTH: Hamstring limitation bilaterally Thomas test [facility], patient reports lateral hip and low back discomfort  PALPATION: Tenderness to left greater trochanteric region  Lumbar CPAs grossly WFL, patient reports localized soreness  LOWER EXTREMITY ROM:   Hip PROM grossly [facility]    Lumbar ROM grossly [facility]  LOWER EXTREMITY MMT:  MMT Right eval Left eval Left 02/27/2024 Left 03/30/2024 Left 04/17/2024 Left 04/24/2024  Hip flexion 5 4      Hip extension 4- 4-   4-   Hip abduction 4 4- 4- 4- 4- 4-  Hip adduction        Hip internal rotation        Hip external rotation        Knee flexion 5 4      Knee extension 5 5      Ankle dorsiflexion        Ankle plantarflexion        Ankle inversion        Ankle eversion         (Blank rows = not tested)  FUNCTIONAL TESTS:  DLLT: 60 deg Squat: grossly WFL with cueing  GAIT: Assistive device utilized: None Level of assistance: Complete Independence Comments: Trendelenburg                                                                                                                               TREATMENT  [facility] Adult PT Treatment:                                                DATE: 04/27/2024 Recumbent bike L4 x 5 min to improve endurance and workload capacity LTR with legs crossed 3 x 10 sec each Piriformis stretch 3 x 20 sec each Supine hamstring stretch with strap 3 x 20 sec each Bridge 2 x 10 each Sidelying hip abduction 2 x 10 on  left Dead bug with stability ball 2 x 10  PATIENT EDUCATION:  Education details: HEP Person educated: Patient Education method:  Explanation, Demonstration, Tactile cues, Verbal cues Education comprehension: verbalized understanding, returned demonstration, verbal cues required, tactile cues required, and needs further education  HOME EXERCISE PROGRAM: Access Code: [email or web address]   ASSESSMENT: CLINICAL IMPRESSION: Patient tolerated therapy well with no adverse effects. She did arrive reporting increase in left leg pain this visit so incorporated more stretching this visit with good benefit, and therapy continued to focus on strengthening for the left hip and core with good tolerance. She did report improvement in symptoms following therapy. No changes made to HEP this visit. Patient would benefit from continued skilled PT to progress mobility and strength in order to reduce pain and maximize functional ability.   Eval: Patient is a 63 y.o. female who was seen today for physical therapy evaluation and treatment for chronic left hip and leg pain. 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 She does exhibit some left hip weakness compared to the right and tenderness over the left greater trochanter and hip musculature.   OBJECTIVE IMPAIRMENTS: decreased activity tolerance, decreased strength, impaired flexibility, and pain.   ACTIVITY LIMITATIONS: lifting, sitting, squatting, sleeping, stairs, and locomotion level  PARTICIPATION LIMITATIONS: shopping, community activity, and occupation  PERSONAL FACTORS: Fitness, Past/current experiences, and Time since onset of injury/illness/exacerbation are also affecting patient's functional outcome.    GOALS: Goals reviewed with patient? Yes  SHORT TERM GOALS: Target date: 03/22/2024  Patient will be I with initial HEP in order to progress with therapy. Baseline: HEP provided at eval 03/26/2024: independent with initial HEP Goal status: MET  2.  Patient will report left hip pain </= 5/10 with activity in order to reduce  functional limitations Baseline: 7/10 pain 03/26/2024: continues to report pain with walking Goal status: ONGOING  LONG TERM GOALS: Target date: 06/15/2024  Patient will be I with final HEP to maintain progress from PT. Baseline: HEP provided at eval 04/20/2024: progressing Goal status: ONGOING  2.  Patient will report PSFS >/= 8 in order to indicate an improvement in functional ability Baseline: 6 04/20/2024: not assessed Goal status: DEFERRED  3.  Patient will demonstrate hip strength >/= 4+/5 MMT in order to improve her walking tolerance and stair negotiation Baseline: see limitations above 04/20/2024: see above Goal status: ONGOING  4.  Patient will report left hip pain </= 3/10 with activity in order to reduce functional limitations Baseline: 7/10 pain 04/20/2024: patient continues to report increase in left sided pain Goal status: ONGOING   PLAN: PT FREQUENCY: 1-2x/week  PT DURATION: 8 weeks  PLANNED INTERVENTIONS: 97164- PT Re-evaluation, 97750- Physical Performance Testing, 97110-Therapeutic exercises, 97530- Therapeutic activity, 97112- Neuromuscular re-education, 97535- Self Care, 02859- Manual therapy, [phone]- Ionotophoresis 4mg /ml Dexamethasone , 79439 (1-2 muscles), 20561 (3+ muscles)- Dry Needling, Patient/Family education, Balance training, Stair training, Taping, Joint mobilization, Joint manipulation, Spinal manipulation, Spinal mobilization, Cryotherapy, and Moist heat  PLAN FOR NEXT SESSION: Review HEP and progress PRN, focus on hip strengthening and control, progressing to closed chain exercises    Elaine Daring, PT, DPT, LAT, ATC 04/27/24  8:44 AM Phone: [phone] Fax: [phone]

## 2024-04-26 DIAGNOSIS — F332 Major depressive disorder, recurrent severe without psychotic features: Secondary | ICD-10-CM | POA: Diagnosis not present

## 2024-04-27 ENCOUNTER — Encounter: Payer: Self-pay | Admitting: Physical Therapy

## 2024-04-27 ENCOUNTER — Ambulatory Visit (INDEPENDENT_AMBULATORY_CARE_PROVIDER_SITE_OTHER): Admitting: Physical Therapy

## 2024-04-27 ENCOUNTER — Other Ambulatory Visit: Payer: Self-pay

## 2024-04-27 DIAGNOSIS — M6281 Muscle weakness (generalized): Secondary | ICD-10-CM

## 2024-04-27 DIAGNOSIS — M25552 Pain in left hip: Secondary | ICD-10-CM | POA: Diagnosis not present

## 2024-04-27 DIAGNOSIS — M5459 Other low back pain: Secondary | ICD-10-CM

## 2024-05-01 ENCOUNTER — Other Ambulatory Visit: Payer: Self-pay

## 2024-05-01 ENCOUNTER — Ambulatory Visit (INDEPENDENT_AMBULATORY_CARE_PROVIDER_SITE_OTHER): Admitting: Physical Therapy

## 2024-05-01 ENCOUNTER — Encounter: Payer: Self-pay | Admitting: Physical Therapy

## 2024-05-01 ENCOUNTER — Other Ambulatory Visit (HOSPITAL_BASED_OUTPATIENT_CLINIC_OR_DEPARTMENT_OTHER): Payer: Self-pay | Admitting: Family Medicine

## 2024-05-01 DIAGNOSIS — M6281 Muscle weakness (generalized): Secondary | ICD-10-CM | POA: Diagnosis not present

## 2024-05-01 DIAGNOSIS — M5459 Other low back pain: Secondary | ICD-10-CM

## 2024-05-01 DIAGNOSIS — M25552 Pain in left hip: Secondary | ICD-10-CM | POA: Diagnosis not present

## 2024-05-01 DIAGNOSIS — Z1231 Encounter for screening mammogram for malignant neoplasm of breast: Secondary | ICD-10-CM

## 2024-05-01 NOTE — Therapy (Signed)
OUTPATIENT PHYSICAL THERAPY TREATMENT   Patient Name: Tammy [name] MRN: [id number] DOB:07-Apr-1961, 63 y.o., female Today's Date: 05/01/2024   END OF SESSION:  PT End of Session - 05/01/24 0756     Visit Number 9    Number of Visits 22    Date for PT Re-Evaluation 06/15/24    Authorization Type [facility] [employer]    PT Start Time 0800    PT Stop Time 0840    PT Time Calculation (min) 40 min    Activity Tolerance Patient tolerated treatment well    Behavior During Therapy [facility] for tasks assessed/performed                  Past Medical History:  Diagnosis Date   Arthritis    Chronic fatigue syndrome    Diverticulosis    External hemorrhoids    Hypertension    Internal hemorrhoids    Neuropathy    Spinal stenosis    Past Surgical History:  Procedure Laterality Date   APPENDECTOMY     CARDIAC CATHETERIZATION  09/18/2012   LAPAROSCOPIC APPENDECTOMY N/A 01/22/2023   Procedure: APPENDECTOMY LAPAROSCOPIC;  Surgeon: Teresa Lonni HERO, MD;  Location: [facility] OR;  Service: General;  Laterality: N/A;   LEFT HEART CATHETERIZATION WITH CORONARY ANGIOGRAM N/A 09/18/2012   Procedure: LEFT HEART CATHETERIZATION WITH CORONARY ANGIOGRAM;  Surgeon: Ozell Fell, MD;  Location: [facility] CATH LAB;  Service: Cardiovascular;  Laterality: N/A;   TONSILLECTOMY  09/21/1963   Patient Active Problem List   Diagnosis Date Noted   Prediabetes 12/28/2023   S/P laparoscopic appendectomy 01/22/2023   Unilateral primary osteoarthritis, left hip 06/25/2022   Hip flexor tendon tightness, left 04/02/2022   Greater trochanteric bursitis of left hip 01/11/2022   Anxiety 07/24/2020   Nonallopathic lesion of cervical region 04/03/2020   Nonallopathic lesion of thoracic region 04/03/2020   Nonallopathic lesion of lumbar region 04/03/2020   Cervical stenosis of spine 03/31/2020   Attention deficit disorder (ADD) in adult 03/31/2020   Low bone density 03/17/2020   Major depressive disorder in full remission  (HCC) 02/15/2020   GERD (gastroesophageal reflux disease) 02/15/2020   Allergic rhinitis 03/09/2016   Essential hypertension 02/18/2014   Chronic pain syndrome 02/22/2013   Chronic insomnia 02/22/2013   Paresthesias 10/25/2012   Hyperlipidemia 09/18/2012    PCP: Katrinka Garnette KIDD, MD  REFERRING PROVIDER: Claudene Arthea HERO, DO  REFERRING DIAG: Left hip pain  THERAPY DIAG:  Pain in left hip  Other low back pain  Muscle weakness (generalized)  Rationale for Evaluation and Treatment: Rehabilitation  ONSET DATE: Chronic   SUBJECTIVE:  SUBJECTIVE STATEMENT: Patient reports she is doing well. No new issues. She did walk yesterday and it went well, so she is going to try and walk a little bit further tomorrow.  Eval: Patient reports left hip and leg pain that has been going on for a while. She used to walk an hour a day but now can't take walks so she has been doing more swimming. She reports a arthritic hip but also some sine issues that could be contributing to pain going down the leg. She has had shots in the bursa of the leg hip but that has done nothing for her pain. She also had lumbar injection, the first one helped but the most recent one didn't do anything. She does occasionally get a catching pain in her lower back but it is not constant.The left hip and leg pain are more constant and were waking her  up at night. She did have previous PT and the dry needling did not seem to help, and the other treatments did not seem to resolve the issue. Currently her most comfortable positions are lying down or standing, sitting or lying on left side seems to aggravate her pain in addition to walking and stairs. She states she has difficulty leading with the left leg going up stairs. She does take Tylenol  when she needs to.   PERTINENT HISTORY: See PMH above  PAIN:  Are you having pain? Yes:  NPRS scale: 4/10 currently, 7/10 at worst Pain location: Left hip and leg Pain description: Sore,  sharp, burning, catching/stabbing Aggravating factors: Walking, sitting, lying on left side, going up stairs Relieving factors: Lying down, standing  PRECAUTIONS: None  PATIENT GOALS: Be able to take long walks, at least 30-45 minutes   OBJECTIVE:  Note: Objective measures were completed at Evaluation unless otherwise noted. PATIENT SURVEYS:  PSFS: 6 Walking 30-60 minutes: 8 Stairs: 6 Sitting comfortably: 5 Weight training safely: 5  MUSCLE LENGTH: Hamstring limitation bilaterally Thomas test [facility] patient reports lateral hip and low back discomfort  PALPATION: Tenderness to left greater trochanteric region  Lumbar CPAs grossly WFL, patient reports localized soreness  LOWER EXTREMITY ROM:   Hip PROM grossly [facility]    Lumbar ROM grossly [facility]  LOWER EXTREMITY MMT:  MMT Right eval Left eval Left 02/27/2024 Left 03/30/2024 Left 04/17/2024 Left 04/24/2024  Hip flexion 5 4      Hip extension 4- 4-   4-   Hip abduction 4 4- 4- 4- 4- 4-  Hip adduction        Hip internal rotation        Hip external rotation        Knee flexion 5 4      Knee extension 5 5      Ankle dorsiflexion        Ankle plantarflexion        Ankle inversion        Ankle eversion         (Blank rows = not tested)  FUNCTIONAL TESTS:  DLLT: 60 deg Squat: grossly WFL with cueing  GAIT: Assistive device utilized: None Level of assistance: Complete Independence Comments: Trendelenburg                                                                                                                               TREATMENT  [facility] Adult PT Treatment:                                                DATE: 05/01/2024 Recumbent bike L4 x 5 min to improve endurance and workload capacity LTR with legs crossed 5 x 5 sec each Marching bridge 2 x 10 each Dead bug with stability ball  2 x 10 Goblet squat to table tap with 15# 3 x 10 Lateral band walk with green at knees 3 x 20 down/back Deadlift with 30# 3 x  8 Pallof press with L2 powerband 2 x 10 each Lateral 8 step-up and over 2 x 10 SLS on Airex 3 x 30 sec each  PATIENT EDUCATION:  Education details: HEP Person educated: Patient Education method: Programmer, multimedia, Demonstration, Actor cues, Verbal cues Education comprehension: verbalized understanding, returned demonstration, verbal cues required, tactile cues required, and needs further education  HOME EXERCISE PROGRAM: Access Code: [email or web address]   ASSESSMENT: CLINICAL IMPRESSION: Patient tolerated therapy well with no adverse effects. Therapy focused on progressing her core, hip, and LE strengthening with good tolerance. She was able to tolerate strengthening exercises well this visit without any increase in pain. Continued with some balance training on firm surface with patient requiring occasional UE support to maintain balance. No changes made to her HEP this visit. Patient would benefit from continued skilled PT to progress mobility and strength in order to reduce pain and maximize functional ability.   Eval: Patient is a 63 y.o. female who was seen today for physical therapy evaluation and treatment for chronic left hip and leg pain. 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 She does exhibit some left hip weakness compared to the right and tenderness over the left greater trochanter and hip musculature.   OBJECTIVE IMPAIRMENTS: decreased activity tolerance, decreased strength, impaired flexibility, and pain.   ACTIVITY LIMITATIONS: lifting, sitting, squatting, sleeping, stairs, and locomotion level  PARTICIPATION LIMITATIONS: shopping, community activity, and occupation  PERSONAL FACTORS: Fitness, Past/current experiences, and Time since onset of injury/illness/exacerbation are also affecting patient's functional outcome.    GOALS: Goals reviewed with patient? Yes  SHORT TERM GOALS: Target date: 03/22/2024  Patient  will be I with initial HEP in order to progress with therapy. Baseline: HEP provided at eval 03/26/2024: independent with initial HEP Goal status: MET  2.  Patient will report left hip pain </= 5/10 with activity in order to reduce functional limitations Baseline: 7/10 pain 03/26/2024: continues to report pain with walking Goal status: ONGOING  LONG TERM GOALS: Target date: 06/15/2024  Patient will be I with final HEP to maintain progress from PT. Baseline: HEP provided at eval 04/20/2024: progressing Goal status: ONGOING  2.  Patient will report PSFS >/= 8 in order to indicate an improvement in functional ability Baseline: 6 04/20/2024: not assessed Goal status: DEFERRED  3.  Patient will demonstrate hip strength >/= 4+/5 MMT in order to improve her walking tolerance and stair negotiation Baseline: see limitations above 04/20/2024: see above Goal status: ONGOING  4.  Patient will report left hip pain </= 3/10 with activity in order to reduce functional limitations Baseline: 7/10 pain 04/20/2024: patient continues to report increase in left sided pain Goal status: ONGOING   PLAN: PT FREQUENCY: 1-2x/week  PT DURATION: 8 weeks  PLANNED INTERVENTIONS: 97164- PT Re-evaluation, 97750- Physical Performance Testing, 97110-Therapeutic exercises, 97530- Therapeutic activity, 97112- Neuromuscular re-education, 97535- Self Care, 02859- Manual therapy, [phone]- Ionotophoresis 4mg /ml Dexamethasone , 79439 (1-2 muscles), 20561 (3+ muscles)- Dry Needling, Patient/Family education, Balance training, Stair training, Taping, Joint mobilization, Joint manipulation, Spinal manipulation, Spinal mobilization, Cryotherapy, and Moist heat  PLAN FOR NEXT SESSION: Review HEP and progress PRN, focus on hip strengthening and control, progressing to closed chain exercises    Elaine Daring, PT, DPT, LAT, ATC 05/01/24  8:42 AM Phone: [phone] Fax:  [phone]   

## 2024-05-03 ENCOUNTER — Other Ambulatory Visit (HOSPITAL_BASED_OUTPATIENT_CLINIC_OR_DEPARTMENT_OTHER): Payer: Self-pay

## 2024-05-04 ENCOUNTER — Ambulatory Visit (INDEPENDENT_AMBULATORY_CARE_PROVIDER_SITE_OTHER): Admitting: Physical Therapy

## 2024-05-04 ENCOUNTER — Other Ambulatory Visit: Payer: Self-pay

## 2024-05-04 ENCOUNTER — Encounter: Payer: Self-pay | Admitting: Physical Therapy

## 2024-05-04 DIAGNOSIS — M5459 Other low back pain: Secondary | ICD-10-CM | POA: Diagnosis not present

## 2024-05-04 DIAGNOSIS — M25552 Pain in left hip: Secondary | ICD-10-CM

## 2024-05-04 DIAGNOSIS — M6281 Muscle weakness (generalized): Secondary | ICD-10-CM | POA: Diagnosis not present

## 2024-05-04 NOTE — Therapy (Signed)
 OUTPATIENT PHYSICAL THERAPY TREATMENT   Patient Name: Tammy [name] MRN: [id number] DOB:08-09-1961, 63 y.o., female Today's Date: 05/04/2024   END OF SESSION:  PT End of Session - 05/04/24 0758     Visit Number 10    Number of Visits 22    Date for PT Re-Evaluation 06/15/24    Authorization Type [facility] [employer]    PT Start Time 0800    PT Stop Time 0840    PT Time Calculation (min) 40 min    Activity Tolerance Patient tolerated treatment well    Behavior During Therapy [facility] for tasks assessed/performed                   Past Medical History:  Diagnosis Date   Arthritis    Chronic fatigue syndrome    Diverticulosis    External hemorrhoids    Hypertension    Internal hemorrhoids    Neuropathy    Spinal stenosis    Past Surgical History:  Procedure Laterality Date   APPENDECTOMY     CARDIAC CATHETERIZATION  09/18/2012   LAPAROSCOPIC APPENDECTOMY N/A 01/22/2023   Procedure: APPENDECTOMY LAPAROSCOPIC;  Surgeon: Teresa Lonni HERO, MD;  Location: [facility];  Service: General;  Laterality: N/A;   LEFT HEART CATHETERIZATION WITH CORONARY ANGIOGRAM N/A 09/18/2012   Procedure: LEFT HEART CATHETERIZATION WITH CORONARY ANGIOGRAM;  Surgeon: Ozell Fell, MD;  Location: [facility] CATH LAB;  Service: Cardiovascular;  Laterality: N/A;   TONSILLECTOMY  09/21/1963   Patient Active Problem List   Diagnosis Date Noted   Prediabetes 12/28/2023   S/P laparoscopic appendectomy 01/22/2023   Unilateral primary osteoarthritis, left hip 06/25/2022   Hip flexor tendon tightness, left 04/02/2022   Greater trochanteric bursitis of left hip 01/11/2022   Anxiety 07/24/2020   Nonallopathic lesion of cervical region 04/03/2020   Nonallopathic lesion of thoracic region 04/03/2020   Nonallopathic lesion of lumbar region 04/03/2020   Cervical stenosis of spine 03/31/2020   Attention deficit disorder (ADD) in adult 03/31/2020   Low bone density 03/17/2020   Major depressive disorder in full  remission (HCC) 02/15/2020   GERD (gastroesophageal reflux disease) 02/15/2020   Allergic rhinitis 03/09/2016   Essential hypertension 02/18/2014   Chronic pain syndrome 02/22/2013   Chronic insomnia 02/22/2013   Paresthesias 10/25/2012   Hyperlipidemia 09/18/2012    PCP: Katrinka Garnette KIDD, MD  REFERRING PROVIDER: Claudene Arthea HERO, DO  REFERRING DIAG: Left hip pain  THERAPY DIAG:  Pain in left hip  Other low back pain  Muscle weakness (generalized)  Rationale for Evaluation and Treatment: Rehabilitation  ONSET DATE: Chronic   SUBJECTIVE:  SUBJECTIVE STATEMENT: Patient reports she felt so good yesterday, she walked for 45 minutes but at about the 35 minutes she came to an incline and her leg started hurting and then it was almost hard to walk or go up stairs.   Eval: Patient reports left hip and leg pain that has been going on for a while. She used to walk an hour a day but now can't take walks so she has been doing more swimming. She reports a arthritic hip but also some sine issues that could be contributing to pain going down the leg. She has had shots in the bursa of the leg hip but that has done nothing for her pain. She also had lumbar injection, the first one helped but the most recent one didn't do anything. She does occasionally get a catching pain in her lower back but it is not constant.The  left hip and leg pain are more constant and were waking her up at night. She did have previous PT and the dry needling did not seem to help, and the other treatments did not seem to resolve the issue. Currently her most comfortable positions are lying down or standing, sitting or lying on left side seems to aggravate her pain in addition to walking and stairs. She states she has difficulty leading with the left leg going up stairs. She does take Tylenol  when she needs to.   PERTINENT HISTORY: See PMH above  PAIN:  Are you having pain? Yes:  NPRS scale: 2/10 currently, 7/10 at  worst Pain location: Left hip and leg Pain description: Sore, sharp, burning, catching/stabbing Aggravating factors: Walking, sitting, lying on left side, going up stairs Relieving factors: Lying down, standing  PRECAUTIONS: None  PATIENT GOALS: Be able to take long walks, at least 30-45 minutes   OBJECTIVE:  Note: Objective measures were completed at Evaluation unless otherwise noted. PATIENT SURVEYS:  PSFS: 6 Walking 30-60 minutes: 8 Stairs: 6 Sitting comfortably: 5 Weight training safely: 5  MUSCLE LENGTH: Hamstring limitation bilaterally Thomas test [facility], patient reports lateral hip and low back discomfort  PALPATION: Tenderness to left greater trochanteric region  Lumbar CPAs grossly WFL, patient reports localized soreness  LOWER EXTREMITY ROM:   Hip PROM grossly [facility]    Lumbar ROM grossly [facility]  LOWER EXTREMITY MMT:  MMT Right eval Left eval Left 02/27/2024 Left 03/30/2024 Left 04/17/2024 Left 04/24/2024 Left 05/04/2024  Hip flexion 5 4       Hip extension 4- 4-   4-    Hip abduction 4 4- 4- 4- 4- 4- 4-  Hip adduction         Hip internal rotation         Hip external rotation         Knee flexion 5 4       Knee extension 5 5       Ankle dorsiflexion         Ankle plantarflexion         Ankle inversion         Ankle eversion          (Blank rows = not tested)  FUNCTIONAL TESTS:  DLLT: 60 deg Squat: grossly WFL with cueing  GAIT: Assistive device utilized: None Level of assistance: Complete Independence Comments: Trendelenburg                                                                                                                               TREATMENT  [facility] Adult PT Treatment:                                                DATE: 05/04/2024 Recumbent bike L5 x 5 min  to improve endurance and workload capacity Lateral band walk with green at mid shin 3 x 20 down/back Forward 8 runner step-up 3 x 10 each Modified side plank 2 x 5 x 5 sec  each Goblet squat to table tap with 15# 3 x 10 Deadlift with 30# 3 x 8 Pallof press with L2 powerband 2 x 10 each  Discussed patient's continued left hip and thigh pain and explained treatment focused on strengthening left hip to improved her tolerance for activity, and gradual progression of her activity level to allow her body to adapt to challenges. Discussed GTPS with patient and how this correlates with her symptoms and treatment plan.  PATIENT EDUCATION:  Education details: HEP update Person educated: Patient Education method: Explanation, Demonstration, Tactile cues, Verbal cues, Handout Education comprehension: verbalized understanding, returned demonstration, verbal cues required, tactile cues required, and needs further education  HOME EXERCISE PROGRAM: Access Code: [email or web address]   ASSESSMENT: CLINICAL IMPRESSION: Patient tolerated therapy well with no adverse effects. She does report overall improvement in her symptoms but does continue to have left sided hip and leg pain when she increased her activity level that does seem consistent with GTPS. Therapy continued to focus on progressing her core, hip, and LE strengthening with good tolerance. Incorporated modified side plank progress for hip and core strengthening and updated her HEP. Patient would benefit from continued skilled PT to progress mobility and strength in order to reduce pain and maximize functional ability.   Eval: Patient is a 63 y.o. female who was seen today for physical therapy evaluation and treatment for chronic left hip and leg pain. 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 She does exhibit some left hip weakness compared to the right and tenderness over the left greater trochanter and hip musculature.   OBJECTIVE IMPAIRMENTS: decreased activity tolerance, decreased strength, impaired flexibility, and pain.   ACTIVITY LIMITATIONS: lifting,  sitting, squatting, sleeping, stairs, and locomotion level  PARTICIPATION LIMITATIONS: shopping, community activity, and occupation  PERSONAL FACTORS: Fitness, Past/current experiences, and Time since onset of injury/illness/exacerbation are also affecting patient's functional outcome.    GOALS: Goals reviewed with patient? Yes  SHORT TERM GOALS: Target date: 03/22/2024  Patient will be I with initial HEP in order to progress with therapy. Baseline: HEP provided at eval 03/26/2024: independent with initial HEP Goal status: MET  2.  Patient will report left hip pain </= 5/10 with activity in order to reduce functional limitations Baseline: 7/10 pain 03/26/2024: continues to report pain with walking Goal status: ONGOING  LONG TERM GOALS: Target date: 06/15/2024  Patient will be I with final HEP to maintain progress from PT. Baseline: HEP provided at eval 04/20/2024: progressing Goal status: ONGOING  2.  Patient will report PSFS >/= 8 in order to indicate an improvement in functional ability Baseline: 6 04/20/2024: not assessed Goal status: DEFERRED  3.  Patient will demonstrate hip strength >/= 4+/5 MMT in order to improve her walking tolerance and stair negotiation Baseline: see limitations above 04/20/2024: see above Goal status: ONGOING  4.  Patient will report left hip pain </= 3/10 with activity in order to reduce functional limitations Baseline: 7/10 pain 04/20/2024: patient continues to report increase in left sided pain Goal status: ONGOING   PLAN: PT FREQUENCY: 1-2x/week  PT DURATION: 8 weeks  PLANNED INTERVENTIONS: 97164- PT Re-evaluation, 97750- Physical Performance Testing, 97110-Therapeutic exercises, 97530- Therapeutic activity, [id number]- Neuromuscular re-education, 97535- Self Care, 02859- Manual therapy, 02966-  Ionotophoresis 4mg /ml Dexamethasone , 20560 (1-2 muscles), 20561 (3+ muscles)- Dry Needling, Patient/Family education, Balance training, Stair training, Taping,  Joint mobilization, Joint manipulation, Spinal manipulation, Spinal mobilization, Cryotherapy, and Moist heat  PLAN FOR NEXT SESSION: Review HEP and progress PRN, focus on hip strengthening and control, progressing to closed chain exercises    Elaine Daring, PT, DPT, LAT, ATC 05/04/24  8:42 AM Phone: [phone] Fax: [phone]

## 2024-05-04 NOTE — Patient Instructions (Signed)
 Access Code: [email or web address] URL: [email or web address] Date: 05/04/2024 Prepared by: Elaine Daring  Exercises - Marching Bridge  - 1 x daily - 3 sets - 10 reps - Supine Dead Bug with Leg Extension  - 1 x daily - 3 sets - 10 reps - Side Plank on Knees  - 1 x daily - 3 sets - 5 reps - 5 seconds hold - Clam with Resistance  - 1 x daily - 3 sets - 10 reps - Side Stepping with Resistance at Thighs  - 1 x daily - 3 sets - 20 reps - Deadlift with Resistance  - 1 x daily - 3 sets - 10 reps

## 2024-05-09 NOTE — Therapy (Signed)
 OUTPATIENT PHYSICAL THERAPY TREATMENT   Patient Name: Tammy [name] MRN: [id number] DOB:Jul 03, 1961, 63 y.o., female Today's Date: 05/11/2024   END OF SESSION:  PT End of Session - 05/11/24 0758     Visit Number 11    Number of Visits 22    Date for PT Re-Evaluation 06/15/24    Authorization Type [facility] [employer]    PT Start Time 0800    PT Stop Time 0840    PT Time Calculation (min) 40 min    Activity Tolerance Patient tolerated treatment well    Behavior During Therapy [facility] for tasks assessed/performed                    Past Medical History:  Diagnosis Date   Arthritis    Chronic fatigue syndrome    Diverticulosis    External hemorrhoids    Hypertension    Internal hemorrhoids    Neuropathy    Spinal stenosis    Past Surgical History:  Procedure Laterality Date   APPENDECTOMY     CARDIAC CATHETERIZATION  09/18/2012   LAPAROSCOPIC APPENDECTOMY N/A 01/22/2023   Procedure: APPENDECTOMY LAPAROSCOPIC;  Surgeon: Teresa Lonni HERO, MD;  Location: [facility] OR;  Service: General;  Laterality: N/A;   LEFT HEART CATHETERIZATION WITH CORONARY ANGIOGRAM N/A 09/18/2012   Procedure: LEFT HEART CATHETERIZATION WITH CORONARY ANGIOGRAM;  Surgeon: Ozell Fell, MD;  Location: [facility] CATH LAB;  Service: Cardiovascular;  Laterality: N/A;   TONSILLECTOMY  09/21/1963   Patient Active Problem List   Diagnosis Date Noted   Prediabetes 12/28/2023   S/P laparoscopic appendectomy 01/22/2023   Unilateral primary osteoarthritis, left hip 06/25/2022   Hip flexor tendon tightness, left 04/02/2022   Greater trochanteric bursitis of left hip 01/11/2022   Anxiety 07/24/2020   Nonallopathic lesion of cervical region 04/03/2020   Nonallopathic lesion of thoracic region 04/03/2020   Nonallopathic lesion of lumbar region 04/03/2020   Cervical stenosis of spine 03/31/2020   Attention deficit disorder (ADD) in adult 03/31/2020   Low bone density 03/17/2020   Major depressive disorder in full  remission (HCC) 02/15/2020   GERD (gastroesophageal reflux disease) 02/15/2020   Allergic rhinitis 03/09/2016   Essential hypertension 02/18/2014   Chronic pain syndrome 02/22/2013   Chronic insomnia 02/22/2013   Paresthesias 10/25/2012   Hyperlipidemia 09/18/2012    PCP: Katrinka Garnette KIDD, MD  REFERRING PROVIDER: Claudene Arthea HERO, DO  REFERRING DIAG: Left hip pain  THERAPY DIAG:  Pain in left hip  Other low back pain  Muscle weakness (generalized)  Rationale for Evaluation and Treatment: Rehabilitation  ONSET DATE: Chronic   SUBJECTIVE:  SUBJECTIVE STATEMENT: Patient reports she is doing well, she has done a few 45 minute walks, one walk was awesome and the other her left hip bothered her some but she did them on back to back days.   Eval: Patient reports left hip and leg pain that has been going on for a while. She used to walk an hour a day but now can't take walks so she has been doing more swimming. She reports a arthritic hip but also some sine issues that could be contributing to pain going down the leg. She has had shots in the bursa of the leg hip but that has done nothing for her pain. She also had lumbar injection, the first one helped but the most recent one didn't do anything. She does occasionally get a catching pain in her lower back but it is not constant.The left hip and  leg pain are more constant and were waking her up at night. She did have previous PT and the dry needling did not seem to help, and the other treatments did not seem to resolve the issue. Currently her most comfortable positions are lying down or standing, sitting or lying on left side seems to aggravate her pain in addition to walking and stairs. She states she has difficulty leading with the left leg going up stairs. She does take Tylenol  when she needs to.   PERTINENT HISTORY: See PMH above  PAIN:  Are you having pain? Yes:  NPRS scale: 2/10 currently, 7/10 at worst Pain location: Left hip  and leg Pain description: Sore, sharp, burning, catching/stabbing Aggravating factors: Walking, sitting, lying on left side, going up stairs Relieving factors: Lying down, standing  PRECAUTIONS: None  PATIENT GOALS: Be able to take long walks, at least 30-45 minutes   OBJECTIVE:  Note: Objective measures were completed at Evaluation unless otherwise noted. PATIENT SURVEYS:  PSFS: 6 Walking 30-60 minutes: 8 Stairs: 6 Sitting comfortably: 5 Weight training safely: 5  MUSCLE LENGTH: Hamstring limitation bilaterally Thomas test [facility], patient reports lateral hip and low back discomfort  PALPATION: Tenderness to left greater trochanteric region  Lumbar CPAs grossly WFL, patient reports localized soreness  LOWER EXTREMITY ROM:   Hip PROM grossly [facility]    Lumbar ROM grossly [facility]  LOWER EXTREMITY MMT:  MMT Right eval Left eval Left 02/27/2024 Left 03/30/2024 Left 04/17/2024 Left 04/24/2024 Left 05/04/2024  Hip flexion 5 4       Hip extension 4- 4-   4-    Hip abduction 4 4- 4- 4- 4- 4- 4-  Hip adduction         Hip internal rotation         Hip external rotation         Knee flexion 5 4       Knee extension 5 5       Ankle dorsiflexion         Ankle plantarflexion         Ankle inversion         Ankle eversion          (Blank rows = not tested)  FUNCTIONAL TESTS:  DLLT: 60 deg Squat: grossly WFL with cueing  GAIT: Assistive device utilized: None Level of assistance: Complete Independence Comments: Trendelenburg                                                                                                                               TREATMENT  [facility] Adult PT Treatment:                                                DATE: 05/11/2024 Recumbent bike L4 x 5 min to improve endurance  and workload capacity LTR 10 x 5 sec Piriformis stretch 2 x 30 sec each Supine hamstring stretch with strap 2 x 30 sec Supine ITB stretch with strap x 30 sec each Goblet squat to table tap  with 15# 3 x 10 Bridge with 15# 3 x 10 Modified side plank and clamshell 5 x 5 each, with green 5 x 5 each Deadlift with 35# 3 x 10  PATIENT EDUCATION:  Education details: HEP update Person educated: Patient Education method: Explanation, Demonstration, Tactile cues, Verbal cues, Handout Education comprehension: verbalized understanding, returned demonstration, verbal cues required, tactile cues required, and needs further education  HOME EXERCISE PROGRAM: Access Code: [email or web address]   ASSESSMENT: CLINICAL IMPRESSION: Patient tolerated therapy well with no adverse effects. Therapy focused on improving her flexibility and progression of hip and core strengthening. She was able to progress with her side plank exercise with the addition of the clamshell and updated her HEP. She was also able to increase weighted resistance with her bridge exercise and with her lifting with good tolerance. Patient would benefit from continued skilled PT to progress mobility and strength in order to reduce pain and maximize functional ability.   Eval: Patient is a 63 y.o. female who was seen today for physical therapy evaluation and treatment for chronic left hip and leg pain. 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 She does exhibit some left hip weakness compared to the right and tenderness over the left greater trochanter and hip musculature.   OBJECTIVE IMPAIRMENTS: decreased activity tolerance, decreased strength, impaired flexibility, and pain.   ACTIVITY LIMITATIONS: lifting, sitting, squatting, sleeping, stairs, and locomotion level  PARTICIPATION LIMITATIONS: shopping, community activity, and occupation  PERSONAL FACTORS: Fitness, Past/current experiences, and Time since onset of injury/illness/exacerbation are also affecting patient's functional outcome.    GOALS: Goals reviewed with patient? Yes  SHORT TERM GOALS: Target date:  03/22/2024  Patient will be I with initial HEP in order to progress with therapy. Baseline: HEP provided at eval 03/26/2024: independent with initial HEP Goal status: MET  2.  Patient will report left hip pain </= 5/10 with activity in order to reduce functional limitations Baseline: 7/10 pain 03/26/2024: continues to report pain with walking Goal status: ONGOING  LONG TERM GOALS: Target date: 06/15/2024  Patient will be I with final HEP to maintain progress from PT. Baseline: HEP provided at eval 04/20/2024: progressing Goal status: ONGOING  2.  Patient will report PSFS >/= 8 in order to indicate an improvement in functional ability Baseline: 6 04/20/2024: not assessed Goal status: DEFERRED  3.  Patient will demonstrate hip strength >/= 4+/5 MMT in order to improve her walking tolerance and stair negotiation Baseline: see limitations above 04/20/2024: see above Goal status: ONGOING  4.  Patient will report left hip pain </= 3/10 with activity in order to reduce functional limitations Baseline: 7/10 pain 04/20/2024: patient continues to report increase in left sided pain Goal status: ONGOING   PLAN: PT FREQUENCY: 1-2x/week  PT DURATION: 8 weeks  PLANNED INTERVENTIONS: 97164- PT Re-evaluation, 97750- Physical Performance Testing, 97110-Therapeutic exercises, 97530- Therapeutic activity, 97112- Neuromuscular re-education, 97535- Self Care, 02859- Manual therapy, [phone]- Ionotophoresis 4mg /ml Dexamethasone , 79439 (1-2 muscles), 20561 (3+ muscles)- Dry Needling, Patient/Family education, Balance training, Stair training, Taping, Joint mobilization, Joint manipulation, Spinal manipulation, Spinal mobilization, Cryotherapy, and Moist heat  PLAN FOR NEXT SESSION: Review HEP and progress PRN, focus on hip strengthening and control, progressing to closed chain exercises  Elaine Daring, PT, DPT, LAT, ATC 05/11/24  8:44 AM Phone: [phone] Fax: [phone]

## 2024-05-10 DIAGNOSIS — F332 Major depressive disorder, recurrent severe without psychotic features: Secondary | ICD-10-CM | POA: Diagnosis not present

## 2024-05-11 ENCOUNTER — Encounter: Payer: Self-pay | Admitting: Physical Therapy

## 2024-05-11 ENCOUNTER — Other Ambulatory Visit: Payer: Self-pay

## 2024-05-11 ENCOUNTER — Ambulatory Visit (INDEPENDENT_AMBULATORY_CARE_PROVIDER_SITE_OTHER): Admitting: Physical Therapy

## 2024-05-11 DIAGNOSIS — M5459 Other low back pain: Secondary | ICD-10-CM

## 2024-05-11 DIAGNOSIS — M6281 Muscle weakness (generalized): Secondary | ICD-10-CM | POA: Diagnosis not present

## 2024-05-11 DIAGNOSIS — M25552 Pain in left hip: Secondary | ICD-10-CM | POA: Diagnosis not present

## 2024-05-11 NOTE — Patient Instructions (Signed)
 Access Code: [email or web address] URL: [email or web address] Date: 05/11/2024 Prepared by: Elaine Daring  Exercises - Marching Bridge  - 1 x daily - 3 sets - 10 reps - Supine Dead Bug with Leg Extension  - 1 x daily - 3 sets - 10 reps - Side Stepping with Resistance at Thighs  - 1 x daily - 3 sets - 20 reps - Deadlift with Resistance  - 1 x daily - 3 sets - 10 reps - Side Plank with Clam and Resistance  - 1 x daily - 3 sets - 5 reps - 5 repetitions hold

## 2024-05-15 ENCOUNTER — Encounter: Payer: Self-pay | Admitting: Physical Therapy

## 2024-05-15 ENCOUNTER — Ambulatory Visit (INDEPENDENT_AMBULATORY_CARE_PROVIDER_SITE_OTHER): Admitting: Physical Therapy

## 2024-05-15 ENCOUNTER — Other Ambulatory Visit: Payer: Self-pay

## 2024-05-15 ENCOUNTER — Ambulatory Visit: Admitting: Family Medicine

## 2024-05-15 DIAGNOSIS — M6281 Muscle weakness (generalized): Secondary | ICD-10-CM | POA: Diagnosis not present

## 2024-05-15 DIAGNOSIS — M25552 Pain in left hip: Secondary | ICD-10-CM | POA: Diagnosis not present

## 2024-05-15 DIAGNOSIS — M5459 Other low back pain: Secondary | ICD-10-CM

## 2024-05-15 NOTE — Therapy (Signed)
 OUTPATIENT PHYSICAL THERAPY TREATMENT   Patient Name: Tammy [name] MRN: [id number] DOB:13-Apr-1961, 63 y.o., female Today's Date: 05/15/2024   END OF SESSION:  PT End of Session - 05/15/24 0759     Visit Number 12    Number of Visits 22    Date for PT Re-Evaluation 06/15/24    Authorization Type [facility] [employer]    PT Start Time 0800    PT Stop Time 0840    PT Time Calculation (min) 40 min    Activity Tolerance Patient tolerated treatment well    Behavior During Therapy [facility] for tasks assessed/performed                     Past Medical History:  Diagnosis Date   Arthritis    Chronic fatigue syndrome    Diverticulosis    External hemorrhoids    Hypertension    Internal hemorrhoids    Neuropathy    Spinal stenosis    Past Surgical History:  Procedure Laterality Date   APPENDECTOMY     CARDIAC CATHETERIZATION  09/18/2012   LAPAROSCOPIC APPENDECTOMY N/A 01/22/2023   Procedure: APPENDECTOMY LAPAROSCOPIC;  Surgeon: Teresa Lonni HERO, MD;  Location: [facility] OR;  Service: General;  Laterality: N/A;   LEFT HEART CATHETERIZATION WITH CORONARY ANGIOGRAM N/A 09/18/2012   Procedure: LEFT HEART CATHETERIZATION WITH CORONARY ANGIOGRAM;  Surgeon: Ozell Fell, MD;  Location: [facility] CATH LAB;  Service: Cardiovascular;  Laterality: N/A;   TONSILLECTOMY  09/21/1963   Patient Active Problem List   Diagnosis Date Noted   Prediabetes 12/28/2023   S/P laparoscopic appendectomy 01/22/2023   Unilateral primary osteoarthritis, left hip 06/25/2022   Hip flexor tendon tightness, left 04/02/2022   Greater trochanteric bursitis of left hip 01/11/2022   Anxiety 07/24/2020   Nonallopathic lesion of cervical region 04/03/2020   Nonallopathic lesion of thoracic region 04/03/2020   Nonallopathic lesion of lumbar region 04/03/2020   Cervical stenosis of spine 03/31/2020   Attention deficit disorder (ADD) in adult 03/31/2020   Low bone density 03/17/2020   Major depressive disorder in full  remission (HCC) 02/15/2020   GERD (gastroesophageal reflux disease) 02/15/2020   Allergic rhinitis 03/09/2016   Essential hypertension 02/18/2014   Chronic pain syndrome 02/22/2013   Chronic insomnia 02/22/2013   Paresthesias 10/25/2012   Hyperlipidemia 09/18/2012    PCP: Katrinka Garnette KIDD, MD  REFERRING PROVIDER: Claudene Arthea HERO, DO  REFERRING DIAG: Left hip pain  THERAPY DIAG:  Pain in left hip  Other low back pain  Muscle weakness (generalized)  Rationale for Evaluation and Treatment: Rehabilitation  ONSET DATE: Chronic   SUBJECTIVE:  SUBJECTIVE STATEMENT: Patient reports she is still doing her walking, she does still get some pain on the outside of the left leg. Some days walking are better than others. She reports she has trouble leading with her left leg going up stairs after she walks.   Eval: Patient reports left hip and leg pain that has been going on for a while. She used to walk an hour a day but now can't take walks so she has been doing more swimming. She reports a arthritic hip but also some sine issues that could be contributing to pain going down the leg. She has had shots in the bursa of the leg hip but that has done nothing for her pain. She also had lumbar injection, the first one helped but the most recent one didn't do anything. She does occasionally get a catching pain in her lower  back but it is not constant.The left hip and leg pain are more constant and were waking her up at night. She did have previous PT and the dry needling did not seem to help, and the other treatments did not seem to resolve the issue. Currently her most comfortable positions are lying down or standing, sitting or lying on left side seems to aggravate her pain in addition to walking and stairs. She states she has difficulty leading with the left leg going up stairs. She does take Tylenol  when she needs to.   PERTINENT HISTORY: See PMH above  PAIN:  Are you having pain? Yes:  NPRS  scale: 2/10 currently, 7/10 at worst Pain location: Left hip and leg Pain description: Sore, sharp, burning, catching/stabbing Aggravating factors: Walking, sitting, lying on left side, going up stairs Relieving factors: Lying down, standing  PRECAUTIONS: None  PATIENT GOALS: Be able to take long walks, at least 30-45 minutes   OBJECTIVE:  Note: Objective measures were completed at Evaluation unless otherwise noted. PATIENT SURVEYS:  PSFS: 6 Walking 30-60 minutes: 8 Stairs: 6 Sitting comfortably: 5 Weight training safely: 5  MUSCLE LENGTH: Hamstring limitation bilaterally Thomas test [facility], patient reports lateral hip and low back discomfort  PALPATION: Tenderness to left greater trochanteric region  Lumbar CPAs grossly WFL, patient reports localized soreness  LOWER EXTREMITY ROM:   Hip PROM grossly [facility]    Lumbar ROM grossly [facility]  LOWER EXTREMITY MMT:  MMT Right eval Left eval Left 02/27/2024 Left 03/30/2024 Left 04/17/2024 Left 04/24/2024 Left 05/04/2024 Left 05/15/2024  Hip flexion 5 4        Hip extension 4- 4-   4-     Hip abduction 4 4- 4- 4- 4- 4- 4- 4-  Hip adduction          Hip internal rotation          Hip external rotation          Knee flexion 5 4        Knee extension 5 5        Ankle dorsiflexion          Ankle plantarflexion          Ankle inversion          Ankle eversion           (Blank rows = not tested)  FUNCTIONAL TESTS:  DLLT: 60 deg Squat: grossly WFL with cueing  GAIT: Assistive device utilized: None Level of assistance: Complete Independence Comments: Trendelenburg                                                                                                                               TREATMENT  [facility] Adult PT Treatment:  DATE: 05/15/2024 Recumbent bike L4 x 5 min to improve endurance and workload capacity Supine hamstring stretch with strap 2 x 30 sec Piriformis stretch 2 x 30 sec  each Dead bug 2 x 20, with stability ball 2 x 10 Sidelying hip abduction with 2# 2 x 15 each Standing 4-way hip with red at ankle 2 x 10 each Forward 8 runner step-up 2 x 10 each  PATIENT EDUCATION:  Education details: HEP Person educated: Patient Education method: Explanation, Demonstration, Tactile cues, Verbal cues Education comprehension: verbalized understanding, returned demonstration, verbal cues required, tactile cues required, and needs further education  HOME EXERCISE PROGRAM: Access Code: [email or web address]   ASSESSMENT: CLINICAL IMPRESSION: Patient tolerated therapy well with no adverse effects. Therapy focused primarily on progressing her left hip strength and endurance. She does continue to exhibit hip weakness on the left side and reports muscle burn with her strengthening exercises. Updated her HEP to progress hip strengthening. Patient would benefit from continued skilled PT to progress mobility and strength in order to reduce pain and maximize functional ability.   Eval: Patient is a 63 y.o. female who was seen today for physical therapy evaluation and treatment for chronic left hip and leg pain. 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 She does exhibit some left hip weakness compared to the right and tenderness over the left greater trochanter and hip musculature.   OBJECTIVE IMPAIRMENTS: decreased activity tolerance, decreased strength, impaired flexibility, and pain.   ACTIVITY LIMITATIONS: lifting, sitting, squatting, sleeping, stairs, and locomotion level  PARTICIPATION LIMITATIONS: shopping, community activity, and occupation  PERSONAL FACTORS: Fitness, Past/current experiences, and Time since onset of injury/illness/exacerbation are also affecting patient's functional outcome.    GOALS: Goals reviewed with patient? Yes  SHORT TERM GOALS: Target date: 03/22/2024  Patient will be I with initial HEP in  order to progress with therapy. Baseline: HEP provided at eval 03/26/2024: independent with initial HEP Goal status: MET  2.  Patient will report left hip pain </= 5/10 with activity in order to reduce functional limitations Baseline: 7/10 pain 03/26/2024: continues to report pain with walking Goal status: ONGOING  LONG TERM GOALS: Target date: 06/15/2024  Patient will be I with final HEP to maintain progress from PT. Baseline: HEP provided at eval 04/20/2024: progressing Goal status: ONGOING  2.  Patient will report PSFS >/= 8 in order to indicate an improvement in functional ability Baseline: 6 04/20/2024: not assessed Goal status: DEFERRED  3.  Patient will demonstrate hip strength >/= 4+/5 MMT in order to improve her walking tolerance and stair negotiation Baseline: see limitations above 04/20/2024: see above Goal status: ONGOING  4.  Patient will report left hip pain </= 3/10 with activity in order to reduce functional limitations Baseline: 7/10 pain 04/20/2024: patient continues to report increase in left sided pain Goal status: ONGOING   PLAN: PT FREQUENCY: 1-2x/week  PT DURATION: 8 weeks  PLANNED INTERVENTIONS: 97164- PT Re-evaluation, 97750- Physical Performance Testing, 97110-Therapeutic exercises, 97530- Therapeutic activity, 97112- Neuromuscular re-education, 97535- Self Care, 02859- Manual therapy, [phone]- Ionotophoresis 4mg /ml Dexamethasone , 79439 (1-2 muscles), 20561 (3+ muscles)- Dry Needling, Patient/Family education, Balance training, Stair training, Taping, Joint mobilization, Joint manipulation, Spinal manipulation, Spinal mobilization, Cryotherapy, and Moist heat  PLAN FOR NEXT SESSION: Review HEP and progress PRN, focus on hip strengthening and control, progressing to closed chain exercises    Elaine Daring, PT, DPT, LAT, ATC 05/15/24  9:08 AM Phone: [phone] Fax: [phone]

## 2024-05-18 ENCOUNTER — Other Ambulatory Visit: Payer: Self-pay

## 2024-05-18 ENCOUNTER — Ambulatory Visit (INDEPENDENT_AMBULATORY_CARE_PROVIDER_SITE_OTHER): Admitting: Physical Therapy

## 2024-05-18 ENCOUNTER — Encounter: Payer: Self-pay | Admitting: Physical Therapy

## 2024-05-18 DIAGNOSIS — M5459 Other low back pain: Secondary | ICD-10-CM | POA: Diagnosis not present

## 2024-05-18 DIAGNOSIS — M25552 Pain in left hip: Secondary | ICD-10-CM

## 2024-05-18 NOTE — Patient Instructions (Signed)
 Access Code: [email or web address] URL: [email or web address] Date: 05/18/2024 Prepared by: Elaine Daring  Exercises - Half Kneeling Hip Flexor Stretch with Sidebend  - 1 x daily - 3 reps - 30 seconds hold - Bridge with Hip Abduction and Resistance  - 1 x daily - 3 sets - 10 reps - 5 seconds hold - Clam with Resistance  - 1 x daily - 3 sets - 15 reps - Supine Dead Bug with Leg Extension  - 1 x daily - 3 sets - 20 reps - Side Stepping with Resistance at Thighs  - 1 x daily - 3 sets - 20 reps - Deadlift with Resistance  - 1 x daily - 3 sets - 10 reps - Standing Hip Abduction with Anchored Resistance  - 1 x daily - 3 sets - 10 reps - Standing Hip Extension with Anchored Resistance  - 1 x daily - 3 sets - 10 reps - Standing Hip Adduction with Anchored Resistance  - 1 x daily - 3 sets - 10 reps - Standing Hip Flexion with Anchored Resistance  - 1 x daily - 3 sets - 10 reps - Step Up  - 1 x daily - 3 sets - 10 reps

## 2024-05-18 NOTE — Therapy (Signed)
 OUTPATIENT PHYSICAL THERAPY TREATMENT   Patient Name: Tammy [name] MRN: [id number] DOB:[birth year], 63 y.o., female Today's Date: 05/18/2024   END OF SESSION:  PT End of Session - 05/18/24 0803     Visit Number 13    Number of Visits 22    Date for PT Re-Evaluation 06/15/24    Authorization Type [facility] [employer]    PT Start Time 0758    PT Stop Time 0839    PT Time Calculation (min) 41 min    Activity Tolerance Patient tolerated treatment well    Behavior During Therapy [facility] for tasks assessed/performed                      Past Medical History:  Diagnosis Date   Arthritis    Chronic fatigue syndrome    Diverticulosis    External hemorrhoids    Hypertension    Internal hemorrhoids    Neuropathy    Spinal stenosis    Past Surgical History:  Procedure Laterality Date   APPENDECTOMY     CARDIAC CATHETERIZATION  09/18/2012   LAPAROSCOPIC APPENDECTOMY N/A 01/22/2023   Procedure: APPENDECTOMY LAPAROSCOPIC;  Surgeon: Teresa Lonni HERO, MD;  Location: [facility] OR;  Service: General;  Laterality: N/A;   LEFT HEART CATHETERIZATION WITH CORONARY ANGIOGRAM N/A 09/18/2012   Procedure: LEFT HEART CATHETERIZATION WITH CORONARY ANGIOGRAM;  Surgeon: Ozell Fell, MD;  Location: [facility] CATH LAB;  Service: Cardiovascular;  Laterality: N/A;   TONSILLECTOMY  09/21/1963   Patient Active Problem List   Diagnosis Date Noted   Prediabetes 12/28/2023   S/P laparoscopic appendectomy 01/22/2023   Unilateral primary osteoarthritis, left hip 06/25/2022   Hip flexor tendon tightness, left 04/02/2022   Greater trochanteric bursitis of left hip 01/11/2022   Anxiety 07/24/2020   Nonallopathic lesion of cervical region 04/03/2020   Nonallopathic lesion of thoracic region 04/03/2020   Nonallopathic lesion of lumbar region 04/03/2020   Cervical stenosis of spine 03/31/2020   Attention deficit disorder (ADD) in adult 03/31/2020   Low bone density 03/17/2020   Major depressive disorder in full  remission (HCC) 02/15/2020   GERD (gastroesophageal reflux disease) 02/15/2020   Allergic rhinitis 03/09/2016   Essential hypertension 02/18/2014   Chronic pain syndrome 02/22/2013   Chronic insomnia 02/22/2013   Paresthesias 10/25/2012   Hyperlipidemia 09/18/2012    PCP: Katrinka Garnette KIDD, MD  REFERRING PROVIDER: Claudene Arthea HERO, DO  REFERRING DIAG: Left hip pain  THERAPY DIAG:  Pain in left hip  Other low back pain  Rationale for Evaluation and Treatment: Rehabilitation  ONSET DATE: Chronic   SUBJECTIVE:  SUBJECTIVE STATEMENT: Patient reports states she is not feeling great in terms of her left hip and leg. She did have more pain with walking yesterday and it feels like her left hip catches when she is standing and then brings the left leg up and moves it out to the side.  Eval: Patient reports left hip and leg pain that has been going on for a while. She used to walk an hour a day but now can't take walks so she has been doing more swimming. She reports a arthritic hip but also some sine issues that could be contributing to pain going down the leg. She has had shots in the bursa of the leg hip but that has done nothing for her pain. She also had lumbar injection, the first one helped but the most recent one didn't do anything. She does occasionally get a catching pain in  her lower back but it is not constant.The left hip and leg pain are more constant and were waking her up at night. She did have previous PT and the dry needling did not seem to help, and the other treatments did not seem to resolve the issue. Currently her most comfortable positions are lying down or standing, sitting or lying on left side seems to aggravate her pain in addition to walking and stairs. She states she has difficulty leading with the left leg going up stairs. She does take Tylenol  when she needs to.   PERTINENT HISTORY: See PMH above  PAIN:  Are you having pain? Yes:  NPRS scale: 4/10 currently,  7/10 at worst Pain location: Left hip and leg Pain description: Sore, sharp, burning, catching/stabbing Aggravating factors: Walking, sitting, lying on left side, going up stairs Relieving factors: Lying down, standing  PRECAUTIONS: None  PATIENT GOALS: Be able to take long walks, at least 30-45 minutes   OBJECTIVE:  Note: Objective measures were completed at Evaluation unless otherwise noted. PATIENT SURVEYS:  PSFS: 6 Walking 30-60 minutes: 8 Stairs: 6 Sitting comfortably: 5 Weight training safely: 5  MUSCLE LENGTH: Hamstring limitation bilaterally Thomas test [facility], patient reports lateral hip and low back discomfort  PALPATION: Tenderness to left greater trochanteric region  Lumbar CPAs grossly WFL, patient reports localized soreness  LOWER EXTREMITY ROM:   Hip PROM grossly [facility]    Lumbar ROM grossly [facility]  LOWER EXTREMITY MMT:  MMT Right eval Left eval Left 02/27/2024 Left 03/30/2024 Left 04/17/2024 Left 04/24/2024 Left 05/04/2024 Left 05/15/2024  Hip flexion 5 4        Hip extension 4- 4-   4-     Hip abduction 4 4- 4- 4- 4- 4- 4- 4-  Hip adduction          Hip internal rotation          Hip external rotation          Knee flexion 5 4        Knee extension 5 5        Ankle dorsiflexion          Ankle plantarflexion          Ankle inversion          Ankle eversion           (Blank rows = not tested)  FUNCTIONAL TESTS:  DLLT: 60 deg Squat: grossly WFL with cueing  GAIT: Assistive device utilized: None Level of assistance: Complete Independence Comments: Trendelenburg                                                                                                                               TREATMENT  [facility] Adult PT Treatment:  DATE: 05/18/2024 Recumbent bike L4 x 5 min to improve endurance and workload capacity Piriformis stretch pull/push 2 x 30 sec each Modified thomas stretch 2 x 30 sec 1/2 kneeling hip  flexor/TFL stretch with side bend 3 x 30 sec Side clamshell with green 3 x 15 Bridge with green below knees 2 x 10 x 5 sec  Discussed role of TFL and compensation due to hip pain and weakness with walking  PATIENT EDUCATION:  Education details: HEP update Person educated: Patient Education method: Explanation, Demonstration, Tactile cues, Verbal cues, Handout Education comprehension: verbalized understanding, returned demonstration, verbal cues required, tactile cues required, and needs further education  HOME EXERCISE PROGRAM: Access Code: [email or web address]   ASSESSMENT: CLINICAL IMPRESSION: Patient tolerated therapy well with no adverse effects. She did arrive reporting increase in left hip and lateral thigh pain that seems to be related to more TFL tightness and pain. She did have tenderness of the TFL this visit but does also report soreness of the glute med region. Therapy focused on stretching for the hip flexor and TFL region primarily and progressing her glute strengthening. She did report improvement in ability to flex and abduct her hip in a standing position following therapy. Updated her HEP to include TFL stretch and glute strengthening. Patient would benefit from continued skilled PT to progress mobility and strength in order to reduce pain and maximize functional ability.   Eval: Patient is a 63 y.o. female who was seen today for physical therapy evaluation and treatment for chronic left hip and leg pain. 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 She does exhibit some left hip weakness compared to the right and tenderness over the left greater trochanter and hip musculature.   OBJECTIVE IMPAIRMENTS: decreased activity tolerance, decreased strength, impaired flexibility, and pain.   ACTIVITY LIMITATIONS: lifting, sitting, squatting, sleeping, stairs, and locomotion level  PARTICIPATION LIMITATIONS: shopping, community  activity, and occupation  PERSONAL FACTORS: Fitness, Past/current experiences, and Time since onset of injury/illness/exacerbation are also affecting patient's functional outcome.    GOALS: Goals reviewed with patient? Yes  SHORT TERM GOALS: Target date: 03/22/2024  Patient will be I with initial HEP in order to progress with therapy. Baseline: HEP provided at eval 03/26/2024: independent with initial HEP Goal status: MET  2.  Patient will report left hip pain </= 5/10 with activity in order to reduce functional limitations Baseline: 7/10 pain 03/26/2024: continues to report pain with walking Goal status: ONGOING  LONG TERM GOALS: Target date: 06/15/2024  Patient will be I with final HEP to maintain progress from PT. Baseline: HEP provided at eval 04/20/2024: progressing Goal status: ONGOING  2.  Patient will report PSFS >/= 8 in order to indicate an improvement in functional ability Baseline: 6 04/20/2024: not assessed Goal status: DEFERRED  3.  Patient will demonstrate hip strength >/= 4+/5 MMT in order to improve her walking tolerance and stair negotiation Baseline: see limitations above 04/20/2024: see above Goal status: ONGOING  4.  Patient will report left hip pain </= 3/10 with activity in order to reduce functional limitations Baseline: 7/10 pain 04/20/2024: patient continues to report increase in left sided pain Goal status: ONGOING   PLAN: PT FREQUENCY: 1-2x/week  PT DURATION: 8 weeks  PLANNED INTERVENTIONS: 97164- PT Re-evaluation, 97750- Physical Performance Testing, 97110-Therapeutic exercises, 97530- Therapeutic activity, [id number]- Neuromuscular re-education, 97535- Self Care, 02859- Manual therapy, 97033- Ionotophoresis 4mg /ml Dexamethasone , 79439 (1-2 muscles), 20561 (3+ muscles)- Dry Needling, Patient/Family  education, Balance training, Stair training, Taping, Joint mobilization, Joint manipulation, Spinal manipulation, Spinal mobilization, Cryotherapy, and Moist  heat  PLAN FOR NEXT SESSION: Review HEP and progress PRN, focus on hip strengthening and control, progressing to closed chain exercises    Elaine Daring, PT, DPT, LAT, ATC 05/18/24  8:46 AM Phone: [phone] Fax: [phone]

## 2024-05-24 DIAGNOSIS — F332 Major depressive disorder, recurrent severe without psychotic features: Secondary | ICD-10-CM | POA: Diagnosis not present

## 2024-05-25 ENCOUNTER — Other Ambulatory Visit: Payer: Self-pay

## 2024-05-25 ENCOUNTER — Ambulatory Visit (INDEPENDENT_AMBULATORY_CARE_PROVIDER_SITE_OTHER): Admitting: Physical Therapy

## 2024-05-25 ENCOUNTER — Encounter: Payer: Self-pay | Admitting: Physical Therapy

## 2024-05-25 DIAGNOSIS — M6281 Muscle weakness (generalized): Secondary | ICD-10-CM | POA: Diagnosis not present

## 2024-05-25 DIAGNOSIS — M25552 Pain in left hip: Secondary | ICD-10-CM | POA: Diagnosis not present

## 2024-05-25 DIAGNOSIS — M5459 Other low back pain: Secondary | ICD-10-CM

## 2024-05-25 NOTE — Therapy (Signed)
 OUTPATIENT PHYSICAL THERAPY TREATMENT   Patient Name: Tammy [name] MRN: [id number] DOB:[birth year], 63 y.o., female Today's Date: 05/25/2024   END OF SESSION:  PT End of Session - 05/25/24 0800     Visit Number 14    Number of Visits 22    Date for PT Re-Evaluation 06/15/24    Authorization Type [facility] [employer]    PT Start Time 0800    PT Stop Time 0844    PT Time Calculation (min) 44 min    Activity Tolerance Patient tolerated treatment well    Behavior During Therapy [facility] for tasks assessed/performed                       Past Medical History:  Diagnosis Date   Arthritis    Chronic fatigue syndrome    Diverticulosis    External hemorrhoids    Hypertension    Internal hemorrhoids    Neuropathy    Spinal stenosis    Past Surgical History:  Procedure Laterality Date   APPENDECTOMY     CARDIAC CATHETERIZATION  09/18/2012   LAPAROSCOPIC APPENDECTOMY N/A 01/22/2023   Procedure: APPENDECTOMY LAPAROSCOPIC;  Surgeon: Teresa Lonni HERO, MD;  Location: [facility];  Service: General;  Laterality: N/A;   LEFT HEART CATHETERIZATION WITH CORONARY ANGIOGRAM N/A 09/18/2012   Procedure: LEFT HEART CATHETERIZATION WITH CORONARY ANGIOGRAM;  Surgeon: Ozell Fell, MD;  Location: [facility] CATH LAB;  Service: Cardiovascular;  Laterality: N/A;   TONSILLECTOMY  09/21/1963   Patient Active Problem List   Diagnosis Date Noted   Prediabetes 12/28/2023   S/P laparoscopic appendectomy 01/22/2023   Unilateral primary osteoarthritis, left hip 06/25/2022   Hip flexor tendon tightness, left 04/02/2022   Greater trochanteric bursitis of left hip 01/11/2022   Anxiety 07/24/2020   Nonallopathic lesion of cervical region 04/03/2020   Nonallopathic lesion of thoracic region 04/03/2020   Nonallopathic lesion of lumbar region 04/03/2020   Cervical stenosis of spine 03/31/2020   Attention deficit disorder (ADD) in adult 03/31/2020   Low bone density 03/17/2020   Major depressive disorder in full  remission (HCC) 02/15/2020   GERD (gastroesophageal reflux disease) 02/15/2020   Allergic rhinitis 03/09/2016   Essential hypertension 02/18/2014   Chronic pain syndrome 02/22/2013   Chronic insomnia 02/22/2013   Paresthesias 10/25/2012   Hyperlipidemia 09/18/2012    PCP: Katrinka Garnette KIDD, MD  REFERRING PROVIDER: Claudene Arthea HERO, DO  REFERRING DIAG: Left hip pain  THERAPY DIAG:  Pain in left hip  Other low back pain  Muscle weakness (generalized)  Rationale for Evaluation and Treatment: Rehabilitation  ONSET DATE: Chronic   SUBJECTIVE:  SUBJECTIVE STATEMENT: Patient reports she has been walking Monday, Wednesday, and Thursday, and her left leg does continue to hurt at the end of the walk and when going up her stairs. The pain does improve after her walks but then she will have pain again in the evening and it does hurt to lie on the left side.   Eval: Patient reports left hip and leg pain that has been going on for a while. She used to walk an hour a day but now can't take walks so she has been doing more swimming. She reports a arthritic hip but also some sine issues that could be contributing to pain going down the leg. She has had shots in the bursa of the leg hip but that has done nothing for her pain. She also had lumbar injection, the first one helped but the most  recent one didn't do anything. She does occasionally get a catching pain in her lower back but it is not constant.The left hip and leg pain are more constant and were waking her up at night. She did have previous PT and the dry needling did not seem to help, and the other treatments did not seem to resolve the issue. Currently her most comfortable positions are lying down or standing, sitting or lying on left side seems to aggravate her pain in addition to walking and stairs. She states she has difficulty leading with the left leg going up stairs. She does take Tylenol  when she needs to.   PERTINENT HISTORY: See  PMH above  PAIN:  Are you having pain? Yes:  NPRS scale: 4/10 currently, 7/10 at worst Pain location: Left hip and leg Pain description: Sore, sharp, burning, catching/stabbing Aggravating factors: Walking, sitting, lying on left side, going up stairs Relieving factors: Lying down, standing  PRECAUTIONS: None  PATIENT GOALS: Be able to take long walks, at least 30-45 minutes   OBJECTIVE:  Note: Objective measures were completed at Evaluation unless otherwise noted. PATIENT SURVEYS:  PSFS: 6 Walking 30-60 minutes: 8 Stairs: 6 Sitting comfortably: 5 Weight training safely: 5  MUSCLE LENGTH: Hamstring limitation bilaterally Thomas test [facility], patient reports lateral hip and low back discomfort  PALPATION: Tenderness to left greater trochanteric region  Lumbar CPAs grossly WFL, patient reports localized soreness  LOWER EXTREMITY ROM:   Hip PROM grossly [facility]    Lumbar ROM grossly [facility]  LOWER EXTREMITY MMT:  MMT Right eval Left eval Left 02/27/2024 Left 03/30/2024 Left 04/17/2024 Left 04/24/2024 Left 05/04/2024 Left 05/15/2024  Hip flexion 5 4        Hip extension 4- 4-   4-     Hip abduction 4 4- 4- 4- 4- 4- 4- 4-  Hip adduction          Hip internal rotation          Hip external rotation          Knee flexion 5 4        Knee extension 5 5        Ankle dorsiflexion          Ankle plantarflexion          Ankle inversion          Ankle eversion           (Blank rows = not tested)  FUNCTIONAL TESTS:  DLLT: 60 deg Squat: grossly WFL with cueing  GAIT: Assistive device utilized: None Level of assistance: Complete Independence Comments: Trendelenburg                                                                                                                               TREATMENT  [facility] Adult PT Treatment:  DATE: 05/25/2024 Recumbent bike L4 x 5 min to improve endurance and workload capacity IASTM using theragun for  left TFL and glute med region Sidelying passive TFL stretch x 30 sec left 1/2 kneeling hip flexor/TFL stretch with side bend 3 x 30 sec left Side clamshell with green 3 x 20 left Bridge with green below knees 10 x 5 sec Lateral band walk with green at knees 3 x 20 down/back Deadlift with 35# 3 x 8 Forward 8 runner step-up 3 x 10 each SLS 3 x 30 sec each  PATIENT EDUCATION:  Education details: HEP Person educated: Patient Education method: Programmer, multimedia, Demonstration, Actor cues, Verbal cues Education comprehension: verbalized understanding, returned demonstration, verbal cues required, tactile cues required, and needs further education  HOME EXERCISE PROGRAM: Access Code: [email or web address]   ASSESSMENT: CLINICAL IMPRESSION: Patient tolerated therapy well with no adverse effects. Incorporated manual using theragun for the left TFL region with good therapeutic benefit. Therapy focused primarily on strengthening for for the hips with good tolerance. She was able to progress with the volume of exercises and increased weight with her lifting. No changes made to her HEP this visit. Patient would benefit from continued skilled PT to progress mobility and strength in order to reduce pain and maximize functional ability.   Eval: Patient is a 64 y.o. female who was seen today for physical therapy evaluation and treatment for chronic left hip and leg pain. 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 She does exhibit some left hip weakness compared to the right and tenderness over the left greater trochanter and hip musculature.   OBJECTIVE IMPAIRMENTS: decreased activity tolerance, decreased strength, impaired flexibility, and pain.   ACTIVITY LIMITATIONS: lifting, sitting, squatting, sleeping, stairs, and locomotion level  PARTICIPATION LIMITATIONS: shopping, community activity, and occupation  PERSONAL FACTORS: Fitness, Past/current  experiences, and Time since onset of injury/illness/exacerbation are also affecting patient's functional outcome.    GOALS: Goals reviewed with patient? Yes  SHORT TERM GOALS: Target date: 03/22/2024  Patient will be I with initial HEP in order to progress with therapy. Baseline: HEP provided at eval 03/26/2024: independent with initial HEP Goal status: MET  2.  Patient will report left hip pain </= 5/10 with activity in order to reduce functional limitations Baseline: 7/10 pain 03/26/2024: continues to report pain with walking Goal status: ONGOING  LONG TERM GOALS: Target date: 06/15/2024  Patient will be I with final HEP to maintain progress from PT. Baseline: HEP provided at eval 04/20/2024: progressing Goal status: ONGOING  2.  Patient will report PSFS >/= 8 in order to indicate an improvement in functional ability Baseline: 6 04/20/2024: not assessed Goal status: DEFERRED  3.  Patient will demonstrate hip strength >/= 4+/5 MMT in order to improve her walking tolerance and stair negotiation Baseline: see limitations above 04/20/2024: see above Goal status: ONGOING  4.  Patient will report left hip pain </= 3/10 with activity in order to reduce functional limitations Baseline: 7/10 pain 04/20/2024: patient continues to report increase in left sided pain Goal status: ONGOING   PLAN: PT FREQUENCY: 1-2x/week  PT DURATION: 8 weeks  PLANNED INTERVENTIONS: 97164- PT Re-evaluation, 97750- Physical Performance Testing, 97110-Therapeutic exercises, 97530- Therapeutic activity, 97112- Neuromuscular re-education, 97535- Self Care, 02859- Manual therapy, [phone]- Ionotophoresis 4mg /ml Dexamethasone , 79439 (1-2 muscles), 20561 (3+ muscles)- Dry Needling, Patient/Family education, Balance training, Stair training, Taping, Joint mobilization, Joint manipulation, Spinal manipulation, Spinal mobilization, Cryotherapy, and Moist heat  PLAN FOR  NEXT SESSION: Review HEP and progress PRN, focus on hip  strengthening and control, progressing to closed chain exercises    Elaine Daring, PT, DPT, LAT, ATC 05/25/24  8:46 AM Phone: [phone] Fax: [phone]

## 2024-05-29 ENCOUNTER — Ambulatory Visit (INDEPENDENT_AMBULATORY_CARE_PROVIDER_SITE_OTHER): Admitting: Physical Therapy

## 2024-05-29 ENCOUNTER — Encounter: Payer: Self-pay | Admitting: Physical Therapy

## 2024-05-29 ENCOUNTER — Other Ambulatory Visit: Payer: Self-pay

## 2024-05-29 DIAGNOSIS — M5459 Other low back pain: Secondary | ICD-10-CM

## 2024-05-29 DIAGNOSIS — M25552 Pain in left hip: Secondary | ICD-10-CM

## 2024-05-29 DIAGNOSIS — M6281 Muscle weakness (generalized): Secondary | ICD-10-CM | POA: Diagnosis not present

## 2024-05-29 NOTE — Therapy (Signed)
 OUTPATIENT PHYSICAL THERAPY TREATMENT   Patient Name: Tammy [name] MRN: [id number] DOB:May 27, 1961, 63 y.o., female Today's Date: 05/29/2024   END OF SESSION:  PT End of Session - 05/29/24 0805     Visit Number 15    Number of Visits 22    Date for PT Re-Evaluation 06/15/24    Authorization Type [facility] [employer]    PT Start Time 0800    PT Stop Time 0838    PT Time Calculation (min) 38 min    Activity Tolerance Patient tolerated treatment well    Behavior During Therapy [facility] for tasks assessed/performed                        Past Medical History:  Diagnosis Date   Arthritis    Chronic fatigue syndrome    Diverticulosis    External hemorrhoids    Hypertension    Internal hemorrhoids    Neuropathy    Spinal stenosis    Past Surgical History:  Procedure Laterality Date   APPENDECTOMY     CARDIAC CATHETERIZATION  09/18/2012   LAPAROSCOPIC APPENDECTOMY N/A 01/22/2023   Procedure: APPENDECTOMY LAPAROSCOPIC;  Surgeon: Teresa Lonni HERO, MD;  Location: [facility] OR;  Service: General;  Laterality: N/A;   LEFT HEART CATHETERIZATION WITH CORONARY ANGIOGRAM N/A 09/18/2012   Procedure: LEFT HEART CATHETERIZATION WITH CORONARY ANGIOGRAM;  Surgeon: Ozell Fell, MD;  Location: [facility] CATH LAB;  Service: Cardiovascular;  Laterality: N/A;   TONSILLECTOMY  09/21/1963   Patient Active Problem List   Diagnosis Date Noted   Prediabetes 12/28/2023   S/P laparoscopic appendectomy 01/22/2023   Unilateral primary osteoarthritis, left hip 06/25/2022   Hip flexor tendon tightness, left 04/02/2022   Greater trochanteric bursitis of left hip 01/11/2022   Anxiety 07/24/2020   Nonallopathic lesion of cervical region 04/03/2020   Nonallopathic lesion of thoracic region 04/03/2020   Nonallopathic lesion of lumbar region 04/03/2020   Cervical stenosis of spine 03/31/2020   Attention deficit disorder (ADD) in adult 03/31/2020   Low bone density 03/17/2020   Major depressive disorder in full  remission (HCC) 02/15/2020   GERD (gastroesophageal reflux disease) 02/15/2020   Allergic rhinitis 03/09/2016   Essential hypertension 02/18/2014   Chronic pain syndrome 02/22/2013   Chronic insomnia 02/22/2013   Paresthesias 10/25/2012   Hyperlipidemia 09/18/2012    PCP: Katrinka Garnette KIDD, MD  REFERRING PROVIDER: Claudene Arthea HERO, DO  REFERRING DIAG: Left hip pain  THERAPY DIAG:  Pain in left hip  Other low back pain  Muscle weakness (generalized)  Rationale for Evaluation and Treatment: Rehabilitation  ONSET DATE: Chronic   SUBJECTIVE:  SUBJECTIVE STATEMENT: Patient reports she has been walking Monday, Wednesday, and Thursday, and her left leg does continue to hurt at the end of the walk and when going up her stairs. The pain does improve after her walks but then she will have pain again in the evening and it does hurt to lie on the left side.   Eval: Patient reports left hip and leg pain that has been going on for a while. She used to walk an hour a day but now can't take walks so she has been doing more swimming. She reports a arthritic hip but also some sine issues that could be contributing to pain going down the leg. She has had shots in the bursa of the leg hip but that has done nothing for her pain. She also had lumbar injection, the first one helped but the  most recent one didn't do anything. She does occasionally get a catching pain in her lower back but it is not constant.The left hip and leg pain are more constant and were waking her up at night. She did have previous PT and the dry needling did not seem to help, and the other treatments did not seem to resolve the issue. Currently her most comfortable positions are lying down or standing, sitting or lying on left side seems to aggravate her pain in addition to walking and stairs. She states she has difficulty leading with the left leg going up stairs. She does take Tylenol  when she needs to.   PERTINENT HISTORY: See  PMH above  PAIN:  Are you having pain? Yes:  NPRS scale: 4/10 currently, 7/10 at worst Pain location: Left hip and leg Pain description: Sore, sharp, burning, catching/stabbing Aggravating factors: Walking, sitting, lying on left side, going up stairs Relieving factors: Lying down, standing  PRECAUTIONS: None  PATIENT GOALS: Be able to take long walks, at least 30-45 minutes   OBJECTIVE:  Note: Objective measures were completed at Evaluation unless otherwise noted. PATIENT SURVEYS:  PSFS: 6 Walking 30-60 minutes: 8 Stairs: 6 Sitting comfortably: 5 Weight training safely: 5  MUSCLE LENGTH: Hamstring limitation bilaterally Thomas test [facility], patient reports lateral hip and low back discomfort  PALPATION: Tenderness to left greater trochanteric region  Lumbar CPAs grossly WFL, patient reports localized soreness  LOWER EXTREMITY ROM:   Hip PROM grossly [facility]    Lumbar ROM grossly [facility]  LOWER EXTREMITY MMT:  MMT Right eval Left eval Left 02/27/2024 Left 03/30/2024 Left 04/17/2024 Left 04/24/2024 Left 05/04/2024 Left 05/15/2024  Hip flexion 5 4        Hip extension 4- 4-   4-     Hip abduction 4 4- 4- 4- 4- 4- 4- 4-  Hip adduction          Hip internal rotation          Hip external rotation          Knee flexion 5 4        Knee extension 5 5        Ankle dorsiflexion          Ankle plantarflexion          Ankle inversion          Ankle eversion           (Blank rows = not tested)  FUNCTIONAL TESTS:  DLLT: 60 deg Squat: grossly WFL with cueing  GAIT: Assistive device utilized: None Level of assistance: Complete Independence Comments: Trendelenburg                                                                                                                               TREATMENT  [facility] Adult PT Treatment:  DATE: 05/29/2024 Recumbent bike L4 x 5 min to improve endurance and workload capacity IASTM using theragun for  left TFL, glute med, ITB and lateral quad region Side clamshell with green 3 x 20 left Hip hike on 4 box 2 x 15 each Deadlift with 35# 3 x 8 Forward 8 step-up holding 10# bilaterally 3 x 10 each Farmer's carry with 20# unilateral 2 x 180' each  PATIENT EDUCATION:  Education details: HEP Person educated: Patient Education method: Explanation, Demonstration, Tactile cues, Verbal cues Education comprehension: verbalized understanding, returned demonstration, verbal cues required, tactile cues required, and needs further education  HOME EXERCISE PROGRAM: Access Code: [email or web address]   ASSESSMENT: CLINICAL IMPRESSION: Patient tolerated therapy well with no adverse effects. She does continue to report tenderness primarily of the TFL and ITB region so continued with IASTM using theragun with good therapeutic benefit. Therapy focused primarily on progressing her hip and LE strengthening with good tolerance. She was able to progress with hip strengthening and control this visit without any report of increased pain. No changes made to her HEP this visit. Patient would benefit from continued skilled PT to progress mobility and strength in order to reduce pain and maximize functional ability.   Eval: Patient is a 63 y.o. female who was seen today for physical therapy evaluation and treatment for chronic left hip and leg pain. 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 She does exhibit some left hip weakness compared to the right and tenderness over the left greater trochanter and hip musculature.   OBJECTIVE IMPAIRMENTS: decreased activity tolerance, decreased strength, impaired flexibility, and pain.   ACTIVITY LIMITATIONS: lifting, sitting, squatting, sleeping, stairs, and locomotion level  PARTICIPATION LIMITATIONS: shopping, community activity, and occupation  PERSONAL FACTORS: Fitness, Past/current experiences, and Time since onset  of injury/illness/exacerbation are also affecting patient's functional outcome.    GOALS: Goals reviewed with patient? Yes  SHORT TERM GOALS: Target date: 03/22/2024  Patient will be I with initial HEP in order to progress with therapy. Baseline: HEP provided at eval 03/26/2024: independent with initial HEP Goal status: MET  2.  Patient will report left hip pain </= 5/10 with activity in order to reduce functional limitations Baseline: 7/10 pain 03/26/2024: continues to report pain with walking Goal status: ONGOING  LONG TERM GOALS: Target date: 06/15/2024  Patient will be I with final HEP to maintain progress from PT. Baseline: HEP provided at eval 04/20/2024: progressing Goal status: ONGOING  2.  Patient will report PSFS >/= 8 in order to indicate an improvement in functional ability Baseline: 6 04/20/2024: not assessed Goal status: DEFERRED  3.  Patient will demonstrate hip strength >/= 4+/5 MMT in order to improve her walking tolerance and stair negotiation Baseline: see limitations above 04/20/2024: see above Goal status: ONGOING  4.  Patient will report left hip pain </= 3/10 with activity in order to reduce functional limitations Baseline: 7/10 pain 04/20/2024: patient continues to report increase in left sided pain Goal status: ONGOING   PLAN: PT FREQUENCY: 1-2x/week  PT DURATION: 8 weeks  PLANNED INTERVENTIONS: 97164- PT Re-evaluation, 97750- Physical Performance Testing, 97110-Therapeutic exercises, 97530- Therapeutic activity, 97112- Neuromuscular re-education, 97535- Self Care, 02859- Manual therapy, 97033- Ionotophoresis 4mg /ml Dexamethasone , 79439 (1-2 muscles), 20561 (3+ muscles)- Dry Needling, Patient/Family education, Balance training, Stair training, Taping, Joint mobilization, Joint manipulation, Spinal manipulation, Spinal mobilization, Cryotherapy, and Moist heat  PLAN FOR NEXT SESSION: Review HEP and progress PRN, focus on hip strengthening  and control,  progressing to closed chain exercises    Elaine Daring, PT, DPT, LAT, ATC 05/29/24  8:40 AM Phone: [phone] Fax: [phone]

## 2024-05-31 NOTE — Therapy (Signed)
 OUTPATIENT PHYSICAL THERAPY TREATMENT   Patient Name: Tammy [name] MRN: [id number] DOB:1961-09-17, 63 y.o., female Today's Date: 06/01/2024   END OF SESSION:  PT End of Session - 06/01/24 0803     Visit Number 16    Number of Visits 22    Date for PT Re-Evaluation 06/15/24    Authorization Type [facility] [employer]    PT Start Time 0800    PT Stop Time 0842    PT Time Calculation (min) 42 min    Activity Tolerance Patient tolerated treatment well    Behavior During Therapy [facility] for tasks assessed/performed                         Past Medical History:  Diagnosis Date   Arthritis    Chronic fatigue syndrome    Diverticulosis    External hemorrhoids    Hypertension    Internal hemorrhoids    Neuropathy    Spinal stenosis    Past Surgical History:  Procedure Laterality Date   APPENDECTOMY     CARDIAC CATHETERIZATION  09/18/2012   LAPAROSCOPIC APPENDECTOMY N/A 01/22/2023   Procedure: APPENDECTOMY LAPAROSCOPIC;  Surgeon: Teresa Lonni HERO, MD;  Location: [facility] OR;  Service: General;  Laterality: N/A;   LEFT HEART CATHETERIZATION WITH CORONARY ANGIOGRAM N/A 09/18/2012   Procedure: LEFT HEART CATHETERIZATION WITH CORONARY ANGIOGRAM;  Surgeon: Ozell Fell, MD;  Location: [facility] CATH LAB;  Service: Cardiovascular;  Laterality: N/A;   TONSILLECTOMY  09/21/1963   Patient Active Problem List   Diagnosis Date Noted   Prediabetes 12/28/2023   S/P laparoscopic appendectomy 01/22/2023   Unilateral primary osteoarthritis, left hip 06/25/2022   Hip flexor tendon tightness, left 04/02/2022   Greater trochanteric bursitis of left hip 01/11/2022   Anxiety 07/24/2020   Nonallopathic lesion of cervical region 04/03/2020   Nonallopathic lesion of thoracic region 04/03/2020   Nonallopathic lesion of lumbar region 04/03/2020   Cervical stenosis of spine 03/31/2020   Attention deficit disorder (ADD) in adult 03/31/2020   Low bone density 03/17/2020   Major depressive disorder in  full remission (HCC) 02/15/2020   GERD (gastroesophageal reflux disease) 02/15/2020   Allergic rhinitis 03/09/2016   Essential hypertension 02/18/2014   Chronic pain syndrome 02/22/2013   Chronic insomnia 02/22/2013   Paresthesias 10/25/2012   Hyperlipidemia 09/18/2012    PCP: Katrinka Garnette KIDD, MD  REFERRING PROVIDER: Claudene Arthea HERO, DO  REFERRING DIAG: Left hip pain  THERAPY DIAG:  Pain in left hip  Other low back pain  Muscle weakness (generalized)  Rationale for Evaluation and Treatment: Rehabilitation  ONSET DATE: Chronic   SUBJECTIVE:  SUBJECTIVE STATEMENT: Patient reports she is still doing about the same. Took walks the past 2 days and struggles with the incline at the end of the walk. Otherwise she is doing ok.   Eval: Patient reports left hip and leg pain that has been going on for a while. She used to walk an hour a day but now can't take walks so she has been doing more swimming. She reports a arthritic hip but also some sine issues that could be contributing to pain going down the leg. She has had shots in the bursa of the leg hip but that has done nothing for her pain. She also had lumbar injection, the first one helped but the most recent one didn't do anything. She does occasionally get a catching pain in her lower back but it is not constant.The left hip and  leg pain are more constant and were waking her up at night. She did have previous PT and the dry needling did not seem to help, and the other treatments did not seem to resolve the issue. Currently her most comfortable positions are lying down or standing, sitting or lying on left side seems to aggravate her pain in addition to walking and stairs. She states she has difficulty leading with the left leg going up stairs. She does take Tylenol  when she needs to.   PERTINENT HISTORY: See PMH above  PAIN:  Are you having pain? Yes:  NPRS scale: 4/10 currently, 7/10 at worst Pain location: Left hip and  leg Pain description: Sore, sharp, burning, catching/stabbing Aggravating factors: Walking, sitting, lying on left side, going up stairs Relieving factors: Lying down, standing  PRECAUTIONS: None  PATIENT GOALS: Be able to take long walks, at least 30-45 minutes   OBJECTIVE:  Note: Objective measures were completed at Evaluation unless otherwise noted. PATIENT SURVEYS:  PSFS: 6 Walking 30-60 minutes: 8 Stairs: 6 Sitting comfortably: 5 Weight training safely: 5  MUSCLE LENGTH: Hamstring limitation bilaterally Thomas test [facility], patient reports lateral hip and low back discomfort  PALPATION: Tenderness to left greater trochanteric region  Lumbar CPAs grossly WFL, patient reports localized soreness  LOWER EXTREMITY ROM:   Hip PROM grossly [facility]    Lumbar ROM grossly [facility]  LOWER EXTREMITY MMT:  MMT Right eval Left eval Left 02/27/2024 Left 03/30/2024 Left 04/17/2024 Left 04/24/2024 Left 05/04/2024 Left 05/15/2024  Hip flexion 5 4        Hip extension 4- 4-   4-     Hip abduction 4 4- 4- 4- 4- 4- 4- 4-  Hip adduction          Hip internal rotation          Hip external rotation          Knee flexion 5 4        Knee extension 5 5        Ankle dorsiflexion          Ankle plantarflexion          Ankle inversion          Ankle eversion           (Blank rows = not tested)  FUNCTIONAL TESTS:  DLLT: 60 deg Squat: grossly WFL with cueing  GAIT: Assistive device utilized: None Level of assistance: Complete Independence Comments: Trendelenburg                                                                                                                               TREATMENT  [facility] Adult PT Treatment:  DATE: 06/01/2024 Recumbent bike L4 x 5 min to improve endurance and workload capacity Sidelying hip abduction with 2# 3 x 15 each SLR with 2# 3 x 15 each Hip hike on 4 box 2 x 20 each Deadlift with 35# 3 x 8 Lateral band walk  with green at ankles  Forward 8 step-up holding 10# bilaterally 3 x 15 each  Discussed her HEP and gym exercise routine, incorporating exercises into her walking and swimming routine  PATIENT EDUCATION:  Education details: HEP update Person educated: Patient Education method: Explanation, Demonstration, Tactile cues, Verbal cues, Handouts Education comprehension: verbalized understanding, returned demonstration, verbal cues required, tactile cues required, and needs further education  HOME EXERCISE PROGRAM: Access Code: [email or web address]   ASSESSMENT: CLINICAL IMPRESSION: Patient tolerated therapy well with no adverse effects. Therapy focused primarily on hip strengthening with good tolerance. She was able to progress with some weighted hip strengthening and increased reps with her standing exercises. Discussed her exercise and activity routine to progress hip strengthening and modifying frequency of exercises. Updated her HEP to progress strengthening for home and gym. Patient would benefit from continued skilled PT to progress mobility and strength in order to reduce pain and maximize functional ability.   Eval: Patient is a 63 y.o. female who was seen today for physical therapy evaluation and treatment for chronic left hip and leg pain. 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 She does exhibit some left hip weakness compared to the right and tenderness over the left greater trochanter and hip musculature.   OBJECTIVE IMPAIRMENTS: decreased activity tolerance, decreased strength, impaired flexibility, and pain.   ACTIVITY LIMITATIONS: lifting, sitting, squatting, sleeping, stairs, and locomotion level  PARTICIPATION LIMITATIONS: shopping, community activity, and occupation  PERSONAL FACTORS: Fitness, Past/current experiences, and Time since onset of injury/illness/exacerbation are also affecting patient's functional outcome.     GOALS: Goals reviewed with patient? Yes  SHORT TERM GOALS: Target date: 03/22/2024  Patient will be I with initial HEP in order to progress with therapy. Baseline: HEP provided at eval 03/26/2024: independent with initial HEP Goal status: MET  2.  Patient will report left hip pain </= 5/10 with activity in order to reduce functional limitations Baseline: 7/10 pain 03/26/2024: continues to report pain with walking Goal status: ONGOING  LONG TERM GOALS: Target date: 06/15/2024  Patient will be I with final HEP to maintain progress from PT. Baseline: HEP provided at eval 04/20/2024: progressing Goal status: ONGOING  2.  Patient will report PSFS >/= 8 in order to indicate an improvement in functional ability Baseline: 6 04/20/2024: not assessed Goal status: DEFERRED  3.  Patient will demonstrate hip strength >/= 4+/5 MMT in order to improve her walking tolerance and stair negotiation Baseline: see limitations above 04/20/2024: see above Goal status: ONGOING  4.  Patient will report left hip pain </= 3/10 with activity in order to reduce functional limitations Baseline: 7/10 pain 04/20/2024: patient continues to report increase in left sided pain Goal status: ONGOING   PLAN: PT FREQUENCY: 1-2x/week  PT DURATION: 8 weeks  PLANNED INTERVENTIONS: 97164- PT Re-evaluation, 97750- Physical Performance Testing, 97110-Therapeutic exercises, 97530- Therapeutic activity, 97112- Neuromuscular re-education, 97535- Self Care, 02859- Manual therapy, [phone]- Ionotophoresis 4mg /ml Dexamethasone , 79439 (1-2 muscles), 20561 (3+ muscles)- Dry Needling, Patient/Family education, Balance training, Stair training, Taping, Joint mobilization, Joint manipulation, Spinal manipulation, Spinal mobilization, Cryotherapy, and Moist heat  PLAN FOR NEXT SESSION: Review HEP and progress PRN, focus on hip  strengthening and control, progressing to closed chain exercises    Elaine Daring, PT, DPT, LAT, ATC 06/01/24   9:24 AM Phone: [phone] Fax: [phone]

## 2024-06-01 ENCOUNTER — Other Ambulatory Visit: Payer: Self-pay

## 2024-06-01 ENCOUNTER — Encounter: Payer: Self-pay | Admitting: Physical Therapy

## 2024-06-01 ENCOUNTER — Ambulatory Visit (INDEPENDENT_AMBULATORY_CARE_PROVIDER_SITE_OTHER): Admitting: Physical Therapy

## 2024-06-01 DIAGNOSIS — M6281 Muscle weakness (generalized): Secondary | ICD-10-CM

## 2024-06-01 DIAGNOSIS — M5459 Other low back pain: Secondary | ICD-10-CM | POA: Diagnosis not present

## 2024-06-01 DIAGNOSIS — M25552 Pain in left hip: Secondary | ICD-10-CM

## 2024-06-01 NOTE — Patient Instructions (Signed)
 Access Code: [email or web address] URL: [email or web address] Date: 06/01/2024 Prepared by: Elaine Daring  Exercises - Half Kneeling Hip Flexor Stretch with Sidebend  - 1 x daily - 3 reps - 30 seconds hold - Straight Leg Raise with Ankle Weight  - 2-3 x weekly - 3 sets - 15 reps - Bridge with Hip Abduction and Resistance  - 2-3 x daily - 3 sets - 10 reps - 5 seconds hold - Sidelying Hip Abduction with Ankle Weight  - 2-3 x daily - 3 sets - 15 reps - Supine Dead Bug with Leg Extension  - 2-3 x daily - 3 sets - 20 reps - Side Stepping with Resistance at Ankles  - 2-3 x weekly - 3 sets - 20 reps - Standing Hip Abduction with Anchored Resistance  - 2-3 x daily - 3 sets - 10 reps - Standing Hip Extension with Anchored Resistance  - 2-3 x daily - 3 sets - 10 reps - Standing Hip Adduction with Anchored Resistance  - 2-3 x daily - 3 sets - 10 reps - Standing Hip Flexion with Anchored Resistance  - 2-3 x daily - 3 sets - 10 reps - Kettlebell Deadlift  - 2-3 x weekly - 3 sets - 10 reps - Runner's Step Up/Down  - 2-3 x weekly - 3 sets - 15 reps

## 2024-06-05 ENCOUNTER — Other Ambulatory Visit: Payer: Self-pay

## 2024-06-05 ENCOUNTER — Ambulatory Visit (INDEPENDENT_AMBULATORY_CARE_PROVIDER_SITE_OTHER): Admitting: Physical Therapy

## 2024-06-05 ENCOUNTER — Encounter: Payer: Self-pay | Admitting: Physical Therapy

## 2024-06-05 DIAGNOSIS — M6281 Muscle weakness (generalized): Secondary | ICD-10-CM | POA: Diagnosis not present

## 2024-06-05 DIAGNOSIS — M25552 Pain in left hip: Secondary | ICD-10-CM | POA: Diagnosis not present

## 2024-06-05 DIAGNOSIS — M5459 Other low back pain: Secondary | ICD-10-CM

## 2024-06-05 NOTE — Therapy (Signed)
 OUTPATIENT PHYSICAL THERAPY TREATMENT   Patient Name: Tammy [name] MRN: [id number] DOB:1961/06/15, 63 y.o., female Today's Date: 06/05/2024   END OF SESSION:  PT End of Session - 06/05/24 0802     Visit Number 17    Number of Visits 22    Date for PT Re-Evaluation 06/15/24    Authorization Type [facility] [employer]    PT Start Time 0800    PT Stop Time 0830    PT Time Calculation (min) 30 min    Activity Tolerance Patient tolerated treatment well    Behavior During Therapy [facility] for tasks assessed/performed                          Past Medical History:  Diagnosis Date   Arthritis    Chronic fatigue syndrome    Diverticulosis    External hemorrhoids    Hypertension    Internal hemorrhoids    Neuropathy    Spinal stenosis    Past Surgical History:  Procedure Laterality Date   APPENDECTOMY     CARDIAC CATHETERIZATION  09/18/2012   LAPAROSCOPIC APPENDECTOMY N/A 01/22/2023   Procedure: APPENDECTOMY LAPAROSCOPIC;  Surgeon: Teresa Lonni HERO, MD;  Location: [facility] OR;  Service: General;  Laterality: N/A;   LEFT HEART CATHETERIZATION WITH CORONARY ANGIOGRAM N/A 09/18/2012   Procedure: LEFT HEART CATHETERIZATION WITH CORONARY ANGIOGRAM;  Surgeon: Ozell Fell, MD;  Location: [facility] CATH LAB;  Service: Cardiovascular;  Laterality: N/A;   TONSILLECTOMY  09/21/1963   Patient Active Problem List   Diagnosis Date Noted   Prediabetes 12/28/2023   S/P laparoscopic appendectomy 01/22/2023   Unilateral primary osteoarthritis, left hip 06/25/2022   Hip flexor tendon tightness, left 04/02/2022   Greater trochanteric bursitis of left hip 01/11/2022   Anxiety 07/24/2020   Nonallopathic lesion of cervical region 04/03/2020   Nonallopathic lesion of thoracic region 04/03/2020   Nonallopathic lesion of lumbar region 04/03/2020   Cervical stenosis of spine 03/31/2020   Attention deficit disorder (ADD) in adult 03/31/2020   Low bone density 03/17/2020   Major depressive disorder in  full remission (HCC) 02/15/2020   GERD (gastroesophageal reflux disease) 02/15/2020   Allergic rhinitis 03/09/2016   Essential hypertension 02/18/2014   Chronic pain syndrome 02/22/2013   Chronic insomnia 02/22/2013   Paresthesias 10/25/2012   Hyperlipidemia 09/18/2012    PCP: Katrinka Garnette KIDD, MD  REFERRING PROVIDER: Claudene Arthea HERO, DO  REFERRING DIAG: Left hip pain  THERAPY DIAG:  Pain in left hip  Other low back pain  Muscle weakness (generalized)  Rationale for Evaluation and Treatment: Rehabilitation  ONSET DATE: Chronic   SUBJECTIVE:  SUBJECTIVE STATEMENT: Patient reports she has to leave at 8:30 today due to work. She has been consistent with her walking and reports it is going well. Her ankle weight comes in today for her HEP.  Eval: Patient reports left hip and leg pain that has been going on for a while. She used to walk an hour a day but now can't take walks so she has been doing more swimming. She reports a arthritic hip but also some sine issues that could be contributing to pain going down the leg. She has had shots in the bursa of the leg hip but that has done nothing for her pain. She also had lumbar injection, the first one helped but the most recent one didn't do anything. She does occasionally get a catching pain in her lower back but it is not constant.The  left hip and leg pain are more constant and were waking her up at night. She did have previous PT and the dry needling did not seem to help, and the other treatments did not seem to resolve the issue. Currently her most comfortable positions are lying down or standing, sitting or lying on left side seems to aggravate her pain in addition to walking and stairs. She states she has difficulty leading with the left leg going up stairs. She does take Tylenol  when she needs to.   PERTINENT HISTORY: See PMH above  PAIN:  Are you having pain? Yes:  NPRS scale: 4/10 currently, 7/10 at worst Pain location: Left  hip and leg Pain description: Sore, sharp, burning, catching/stabbing Aggravating factors: Walking, sitting, lying on left side, going up stairs Relieving factors: Lying down, standing  PRECAUTIONS: None  PATIENT GOALS: Be able to take long walks, at least 30-45 minutes   OBJECTIVE:  Note: Objective measures were completed at Evaluation unless otherwise noted. PATIENT SURVEYS:  PSFS: 6 Walking 30-60 minutes: 8 Stairs: 6 Sitting comfortably: 5 Weight training safely: 5  MUSCLE LENGTH: Hamstring limitation bilaterally Thomas test [facility], patient reports lateral hip and low back discomfort  PALPATION: Tenderness to left greater trochanteric region  Lumbar CPAs grossly WFL, patient reports localized soreness  LOWER EXTREMITY ROM:   Hip PROM grossly [facility]    Lumbar ROM grossly [facility]  LOWER EXTREMITY MMT:  MMT Right eval Left eval Left 02/27/2024 Left 03/30/2024 Left 04/17/2024 Left 04/24/2024 Left 05/04/2024 Left 05/15/2024  Hip flexion 5 4        Hip extension 4- 4-   4-     Hip abduction 4 4- 4- 4- 4- 4- 4- 4-  Hip adduction          Hip internal rotation          Hip external rotation          Knee flexion 5 4        Knee extension 5 5        Ankle dorsiflexion          Ankle plantarflexion          Ankle inversion          Ankle eversion           (Blank rows = not tested)  FUNCTIONAL TESTS:  DLLT: 60 deg Squat: grossly WFL with cueing  GAIT: Assistive device utilized: None Level of assistance: Complete Independence Comments: Trendelenburg                                                                                                                               TREATMENT  [facility] Adult PT Treatment:  DATE: 06/05/2024 Recumbent bike L5 x 3 min to improve endurance and workload capacity Sidelying hip abduction with 2# 3 x 15 left SLR with 2# 2 x 15 left Deadlift with 35# 3 x 10 Lateral band walk with green at ankles 3  x 20 down/back Lateral 8 step-up and over 3 x 10 Dead bug 2 x 20  PATIENT EDUCATION:  Education details: HEP Person educated: Patient Education method: Programmer, multimedia, Demonstration, Actor cues, Verbal cues Education comprehension: verbalized understanding, returned demonstration, verbal cues required, tactile cues required, and needs further education  HOME EXERCISE PROGRAM: Access Code: [email or web address]   ASSESSMENT: CLINICAL IMPRESSION: Patient tolerated therapy well with no adverse effects. Patient needed to leave early this visit so therapy limited on time. Continued focus on progressing her strength to improve activity tolerance. She does report muscle burn and fatigue of the left hip region with exercises. No changes made to HEP this visit. Patient would benefit from continued skilled PT to progress mobility and strength in order to reduce pain and maximize functional ability.   Eval: Patient is a 63 y.o. female who was seen today for physical therapy evaluation and treatment for chronic left hip and leg pain. 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 She does exhibit some left hip weakness compared to the right and tenderness over the left greater trochanter and hip musculature.   OBJECTIVE IMPAIRMENTS: decreased activity tolerance, decreased strength, impaired flexibility, and pain.   ACTIVITY LIMITATIONS: lifting, sitting, squatting, sleeping, stairs, and locomotion level  PARTICIPATION LIMITATIONS: shopping, community activity, and occupation  PERSONAL FACTORS: Fitness, Past/current experiences, and Time since onset of injury/illness/exacerbation are also affecting patient's functional outcome.    GOALS: Goals reviewed with patient? Yes  SHORT TERM GOALS: Target date: 03/22/2024  Patient will be I with initial HEP in order to progress with therapy. Baseline: HEP provided at eval 03/26/2024: independent with initial  HEP Goal status: MET  2.  Patient will report left hip pain </= 5/10 with activity in order to reduce functional limitations Baseline: 7/10 pain 03/26/2024: continues to report pain with walking Goal status: ONGOING  LONG TERM GOALS: Target date: 06/15/2024  Patient will be I with final HEP to maintain progress from PT. Baseline: HEP provided at eval 04/20/2024: progressing Goal status: ONGOING  2.  Patient will report PSFS >/= 8 in order to indicate an improvement in functional ability Baseline: 6 04/20/2024: not assessed Goal status: DEFERRED  3.  Patient will demonstrate hip strength >/= 4+/5 MMT in order to improve her walking tolerance and stair negotiation Baseline: see limitations above 04/20/2024: see above Goal status: ONGOING  4.  Patient will report left hip pain </= 3/10 with activity in order to reduce functional limitations Baseline: 7/10 pain 04/20/2024: patient continues to report increase in left sided pain Goal status: ONGOING   PLAN: PT FREQUENCY: 1-2x/week  PT DURATION: 8 weeks  PLANNED INTERVENTIONS: 97164- PT Re-evaluation, 97750- Physical Performance Testing, 97110-Therapeutic exercises, 97530- Therapeutic activity, 97112- Neuromuscular re-education, 97535- Self Care, 02859- Manual therapy, [phone]- Ionotophoresis 4mg /ml Dexamethasone , 79439 (1-2 muscles), 20561 (3+ muscles)- Dry Needling, Patient/Family education, Balance training, Stair training, Taping, Joint mobilization, Joint manipulation, Spinal manipulation, Spinal mobilization, Cryotherapy, and Moist heat  PLAN FOR NEXT SESSION: Review HEP and progress PRN, focus on hip strengthening and control, progressing to closed chain exercises    Elaine Daring, PT, DPT, LAT, ATC 06/05/24  8:31 AM Phone: [phone] Fax: [phone]

## 2024-06-07 DIAGNOSIS — F332 Major depressive disorder, recurrent severe without psychotic features: Secondary | ICD-10-CM | POA: Diagnosis not present

## 2024-06-08 ENCOUNTER — Other Ambulatory Visit (HOSPITAL_COMMUNITY): Payer: Self-pay

## 2024-06-12 ENCOUNTER — Other Ambulatory Visit (HOSPITAL_COMMUNITY): Payer: Self-pay

## 2024-06-12 ENCOUNTER — Other Ambulatory Visit: Payer: Self-pay

## 2024-06-12 ENCOUNTER — Ambulatory Visit (INDEPENDENT_AMBULATORY_CARE_PROVIDER_SITE_OTHER): Admitting: Behavioral Health

## 2024-06-12 ENCOUNTER — Encounter: Payer: Self-pay | Admitting: Behavioral Health

## 2024-06-12 DIAGNOSIS — F988 Other specified behavioral and emotional disorders with onset usually occurring in childhood and adolescence: Secondary | ICD-10-CM | POA: Diagnosis not present

## 2024-06-12 MED ORDER — LISDEXAMFETAMINE DIMESYLATE 20 MG PO CAPS
20.0000 mg | ORAL_CAPSULE | Freq: Every day | ORAL | 0 refills | Status: DC
  Filled 2024-06-12 – 2024-06-23 (×2): qty 90, 90d supply, fill #0

## 2024-06-12 NOTE — Progress Notes (Signed)
 [facility]  Patient ID: Tammy [name],  MRN: [id number]  PCP: Tammy Garnette KIDD, MD  Date of Evaluation: 06/12/2024 Time spent:30 minutes  Chief Complaint:  Chief Complaint   Depression; Anxiety; Follow-up; Medication Refill; Patient Education     HISTORY/CURRENT STATUS: HPI  Tammy [name] presents to the office today for  Collateral information should be considered reliable. Says she continues to enjoy good stability. Experienced some mild negative symptoms when reducing dose of Abilify  to 0.5 mg  but is managing well.  She would like to remain on this dose if possible.   No other changes to her medication regimen at this time. Denies history of mania, no psychosis, no auditory or visual hallucinations.  Denies SI.  Requesting 108-month follow-up.     Past Psychiatric Medication Trials: Cymbalta  Zoloft- Interfered with concentration and thinking was not clear Lexapro- Nocturnal panic attacks.   Prozac - Was on 40 mg of Prozac  at the time she was having akathisia with Abilify  2 mg. Higher doses seemed to exacerbate akathisia Abilify - helpful. Started on 2 mg daily. Had akathisia and twitching at 5 mg Rexulti- Had very low motivation and fatigue Lamictal- rash Klonopin - Takes as needed. Did not help with rumination. Ambien  Vyvanse - took 20 mg in the past for ADHD. Re-started 10 mg dose. Remeron- Gained 20 lbs. Gabapentin - Given for neuropathy and caused cloudy thoughts.  Lamotrigine- tingling/burning around mouth and vagina. No rash or blisters.  May have been helpful for mood. Topamax    Had [facility] testing that indicated adverse effects to Zoloft, Lexapro, and Paxil.   Individual Medical History/ Review of Systems: Changes? :No   Allergies: Egg-derived products  Current Medications:  Current Outpatient Medications:    ACETAMINOPHEN -CAFFEINE PO, Take by mouth in the morning., Disp: , Rfl:    ARIPiprazole  (ABILIFY ) 2 MG tablet, Take 0.5-1 tablets (1-2 mg  total) by mouth daily., Disp: 90 tablet, Rfl: 1   clonazePAM  (KLONOPIN ) 0.5 MG tablet, Take 1 tablet (0.5 mg total) by mouth daily as needed., Disp: 30 tablet, Rfl: 0   FLUoxetine  (PROZAC ) 10 MG capsule, Take 3 capsules (30 mg total) by mouth daily., Disp: 270 capsule, Rfl: 1   hydrochlorothiazide  (HYDRODIURIL ) 25 MG tablet, Take 1 tablet (25 mg total) by mouth daily., Disp: 90 tablet, Rfl: 3   lisdexamfetamine (VYVANSE ) 10 MG capsule, Take 1-2 capsules (10-20 mg total) by mouth daily in the afternoon., Disp: 180 capsule, Rfl: 0   lisdexamfetamine (VYVANSE ) 20 MG capsule, Take 1 capsule (20 mg total) by mouth in the morning., Disp: 90 capsule, Rfl: 0   lisdexamfetamine (VYVANSE ) 20 MG capsule, Take 1 capsule (20 mg total) by mouth daily., Disp: 90 capsule, Rfl: 0   MAGNESIUM CHLORIDE-CALCIUM  PO, Take by mouth., Disp: , Rfl:    Melatonin 3 MG CAPS, Take 6 mg by mouth at bedtime., Disp: , Rfl:    metFORMIN  (GLUCOPHAGE -XR) 500 MG 24 hr tablet, Take 1 tablet (500 mg total) by mouth daily. May increase to 2 tablets daily., Disp: 60 tablet, Rfl: 3   pantoprazole  (PROTONIX ) 20 MG tablet, Take 1 tablet (20 mg total) by mouth daily., Disp: 90 tablet, Rfl: 3   Probiotic Product (PROBIOTIC DAILY PO), Take by mouth., Disp: , Rfl:  Medication Side Effects: none  Family Medical/ Social History: Changes? No  MENTAL HEALTH EXAM:  Last menstrual period 11/05/2007.There is no height or weight on file to calculate BMI.  General Appearance: Casual, Neat, and Well Groomed  Eye Contact:  Good  Speech:  Clear and Coherent  Volume:  Normal  Mood:  NA  Affect:  Appropriate  Thought Process:  Coherent  Orientation:  Full (Time, Place, and Person)  Thought Content: Logical   Suicidal Thoughts:  No  Homicidal Thoughts:  No  Memory:  WNL  Judgement:  Good  Insight:  Good  Psychomotor Activity:  Normal  Concentration:  good  Recall:  Good  Fund of Knowledge: Good  Language: Good  Assets:  Desire for  Improvement  ADL's:  Intact  Cognition: WNL  Prognosis:  Good    DIAGNOSES:    ICD-10-CM   1. Attention deficit disorder (ADD) in adult  F98.8 lisdexamfetamine (VYVANSE ) 20 MG capsule      Receiving Psychotherapy: No    RECOMMENDATIONS: yes  Greater than 50% of  30 min face to face time with patient was spent on counseling and coordination of care. She was able to wean to 0.5 mg Abilify . Has experienced some mild negative symptoms. She is very self aware and will remain on this dose for now. She will increase back to 1 mg if necessary.    We agreed to:   Will continue current plan of care since target signs and symptoms are well controlled without any tolerability issues. Continue Abilify  0.5 mg daily for augmentation of depression. Will increase and notify this office if necessary. Discussed that Metformin  is used off-label for antipsychotic induced weight gain. Discussed that this may be a possible treatment consideration in the future.  Continue Prozac  30 mg daily for anxiety and depression.  Continue Vyvanse  20 mg daily for ADHD.  Continue klonopin  0.5 mg daily as needed for anxiety.  Provided emergency contact information Pt to follow-up in 3  months or sooner if clinically indicated.  Patient advised to contact office with any questions, adverse effects, or acute worsening in signs and symptoms. Reviewed PDMP  Tammy DELENA Pizza, NP

## 2024-06-13 NOTE — Progress Notes (Unsigned)
 [name] [address] [place] [address] Phone: [phone] Subjective:   ISusannah [name], am serving as a scribe for Dr. Arthea Claudene.  I'm seeing this patient by the request  of:  Katrinka Garnette KIDD, MD  CC: Left hip pain  YEP:Dlagzrupcz  02/02/2024 Patient did not respond extremely well to the epidural. Hopeful that this is more localized. Discussed icing regimen of home exercises, which activities to do and which ones to avoid. Increase activity slowly over the course of next several weeks. Discussed icing regimen. Follow-up again in 6 to 8 weeks otherwise.   Update 06/14/2024 Tammy [name] is a 63 y.o. female coming in with complaint of L hip pain.  Has been in physical therapy for some time now.  Did have an epidural in the back in March but did not respond well to it this last time.  In May was given an injection for the greater trochanteric area.  Patient states has been doing PT and has reached her goals of activity with walking and swimming. Function has gotten better, but pain only has slightly gotten better. Soreness going down lateral thigh. GT injections not helping.       Past Medical History:  Diagnosis Date   Arthritis    Chronic fatigue syndrome    Diverticulosis    External hemorrhoids    Hypertension    Internal hemorrhoids    Neuropathy    Spinal stenosis    Past Surgical History:  Procedure Laterality Date   APPENDECTOMY     CARDIAC CATHETERIZATION  09/18/2012   LAPAROSCOPIC APPENDECTOMY N/A 01/22/2023   Procedure: APPENDECTOMY LAPAROSCOPIC;  Surgeon: Teresa Lonni HERO, MD;  Location: [facility] OR;  Service: General;  Laterality: N/A;   LEFT HEART CATHETERIZATION WITH CORONARY ANGIOGRAM N/A 09/18/2012   Procedure: LEFT HEART CATHETERIZATION WITH CORONARY ANGIOGRAM;  Surgeon: Ozell Fell, MD;  Location: [facility] CATH LAB;  Service: Cardiovascular;  Laterality: N/A;   TONSILLECTOMY  09/21/1963   Social History    Socioeconomic History   Marital status: Married    Spouse name: Not on file   Number of children: 2   Years of education: Not on file   Highest education level: Master's degree (e.g., MA, MS, MEng, MEd, MSW, MBA)  Occupational History   Occupation: Conservation officer, historic buildings: [place]  Tobacco Use   Smoking status: Never   Smokeless tobacco: Never  Substance and Sexual Activity   Alcohol use: No   Drug use: No   Sexual activity: Not Currently    Birth control/protection: Post-menopausal  Other Topics Concern   Not on file  Social History Narrative   Married. 2 adopted children- adopted daughter (36) when single after first marriage from [place]. Son from [place] with 2nd husband(18) in 2021. 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[facility]      Hobbies: animal rescue- has litter of kittens in 2021, walking, time outside   Social Drivers of [employer] Strain: Low Risk  (12/26/2023)   Overall Financial Resource Strain (CARDIA)    Difficulty of Paying Living Expenses: Not hard at all  Food Insecurity: No Food Insecurity (12/26/2023)   Hunger Vital Sign    Worried About Running Out of Food in the Last Year: Never true    Ran Out of Food in the Last Year: Never true  Transportation Needs: No Transportation Needs (12/26/2023)   PRAPARE - Transportation  Lack of Transportation (Medical): No    Lack of Transportation (Non-Medical): No  Physical Activity: Sufficiently Active (12/26/2023)   Exercise Vital Sign    Days of Exercise per Week: 3 days    Minutes of Exercise per Session: 70 min  Stress: No Stress Concern Present (12/26/2023)   [employer] of Occupational Health - Occupational Stress Questionnaire    Feeling of Stress : Only a little  Social Connections: Moderately Integrated (12/26/2023)   Social Connection and Isolation Panel    Frequency of Communication with Friends and Family: Three times a week    Frequency of Social  Gatherings with Friends and Family: Once a week    Attends Religious Services: 1 to 4 times per year    Active Member of [employer] or Organizations: No    Attends Engineer, structural: Not on file    Marital Status: Married   Allergies  Allergen Reactions   Egg-Derived Products Other (See Comments)    Makes pt feel like burping up rotten eggs   Family History  Problem Relation Age of Onset   Heart attack Maternal Grandmother 50   Arthritis Maternal Grandmother    Hyperlipidemia Maternal Grandmother    Heart disease Maternal Grandmother    Diabetes Maternal Grandmother    Pancreatic cancer Mother        died 76. 2.5 years past diagnosis.     Anxiety disorder Mother    Depression Mother    Colon polyps Father    Healthy Sister    Healthy Brother    Depression Brother    Heart attack Brother        age 44   Suicidality Maternal Grandfather    Colon cancer Neg Hx    Esophageal cancer Neg Hx    Kidney disease Neg Hx      Current Outpatient Medications (Cardiovascular):    hydrochlorothiazide  (HYDRODIURIL ) 25 MG tablet, Take 1 tablet (25 mg total) by mouth daily.   Current Outpatient Medications (Analgesics):    ACETAMINOPHEN -CAFFEINE PO, Take by mouth in the morning.   Current Outpatient Medications (Other):    ARIPiprazole  (ABILIFY ) 2 MG tablet, Take 0.5-1 tablets (1-2 mg total) by mouth daily.   clonazePAM  (KLONOPIN ) 0.5 MG tablet, Take 1 tablet (0.5 mg total) by mouth daily as needed.   FLUoxetine  (PROZAC ) 10 MG capsule, Take 3 capsules (30 mg total) by mouth daily.   lisdexamfetamine (VYVANSE ) 10 MG capsule, Take 1-2 capsules (10-20 mg total) by mouth daily in the afternoon.   lisdexamfetamine (VYVANSE ) 20 MG capsule, Take 1 capsule (20 mg total) by mouth in the morning.   lisdexamfetamine (VYVANSE ) 20 MG capsule, Take 1 capsule (20 mg total) by mouth daily.   MAGNESIUM CHLORIDE-CALCIUM  PO, Take by mouth.   Melatonin 3 MG CAPS, Take 6 mg by mouth at bedtime.    pantoprazole  (PROTONIX ) 20 MG tablet, Take 1 tablet (20 mg total) by mouth daily.   Probiotic Product (PROBIOTIC DAILY PO), Take by mouth.   Reviewed prior external information including notes and imaging from  primary care provider As well as notes that were available from care everywhere and other healthcare systems.  Past medical history, social, surgical and family history all reviewed in electronic medical record.  No pertanent information unless stated regarding to the chief complaint.   Review of Systems:  No headache, visual changes, nausea, vomiting, diarrhea, constipation, dizziness, abdominal pain, skin rash, fevers, chills, night sweats, weight loss, swollen lymph nodes, body aches, joint swelling, chest  pain, shortness of breath, mood changes. POSITIVE muscle aches  Objective  Blood pressure 114/80, pulse 79, height 5' 1 (1.549 m), weight 161 lb (73 kg), last menstrual period 11/05/2007, SpO2 97%.   General: No apparent distress alert and oriented x3 mood and affect normal, dressed appropriately.  HEENT: Pupils equal, extraocular movements intact  Respiratory: Patient's speak in full sentences and does not appear short of breath  Cardiovascular: No lower extremity edema, non tender, no erythema   Left hip exam shows mild tenderness over the lateral aspect.  Patient is back to exam though does have some mild loss of lordosis.  Able to get up from a seated position without any significant difficulty.  Patient on does have worsening pain with some extension of the back and mild worsening hip pain with straight leg test.   Impression and Recommendations:     The above documentation has been reviewed and is accurate and complete Krithik Mapel M Tynlee Bayle, DO

## 2024-06-14 ENCOUNTER — Ambulatory Visit: Payer: Self-pay | Admitting: Family Medicine

## 2024-06-14 ENCOUNTER — Other Ambulatory Visit: Payer: Self-pay

## 2024-06-14 ENCOUNTER — Ambulatory Visit: Admitting: Family Medicine

## 2024-06-14 VITALS — BP 114/80 | HR 79 | Ht 61.0 in | Wt 161.0 lb

## 2024-06-14 DIAGNOSIS — G894 Chronic pain syndrome: Secondary | ICD-10-CM | POA: Diagnosis not present

## 2024-06-14 DIAGNOSIS — M7062 Trochanteric bursitis, left hip: Secondary | ICD-10-CM | POA: Diagnosis not present

## 2024-06-14 DIAGNOSIS — M25552 Pain in left hip: Secondary | ICD-10-CM | POA: Diagnosis not present

## 2024-06-14 DIAGNOSIS — D509 Iron deficiency anemia, unspecified: Secondary | ICD-10-CM

## 2024-06-14 LAB — IBC PANEL
Iron: 45 ug/dL (ref 42–145)
Saturation Ratios: 10.1 % — ABNORMAL LOW (ref 20.0–50.0)
TIBC: 446.6 ug/dL (ref 250.0–450.0)
Transferrin: 319 mg/dL (ref 212.0–360.0)

## 2024-06-14 LAB — FERRITIN: Ferritin: 6.6 ng/mL — ABNORMAL LOW (ref 10.0–291.0)

## 2024-06-14 NOTE — Patient Instructions (Addendum)
 Labs today [facility] [phone] See you again in 3 months

## 2024-06-14 NOTE — Assessment & Plan Note (Signed)
 At this point patient is not getting any significant improvement with the injections.  We discussed with patient that icing regimen and home exercises, discussed with patient about the possibility of radiation from the lumbar spine and we will try an L4-L5 epidural that I am hopeful will make some difference.  Patient is doing wonderful with loss of weight and increasing activity as well as did wonderful with physical therapy.  We discussed that iron deficiency could also be be potentially contributing.  Recheck labs to see if patient is making any significant difference or will consider the possibility of transfusion.  Follow-up with me again 3 months otherwise

## 2024-06-15 ENCOUNTER — Other Ambulatory Visit (HOSPITAL_COMMUNITY): Payer: Self-pay

## 2024-06-19 ENCOUNTER — Ambulatory Visit (INDEPENDENT_AMBULATORY_CARE_PROVIDER_SITE_OTHER): Admitting: Physical Therapy

## 2024-06-19 ENCOUNTER — Encounter: Payer: Self-pay | Admitting: Physical Therapy

## 2024-06-19 ENCOUNTER — Other Ambulatory Visit: Payer: Self-pay

## 2024-06-19 DIAGNOSIS — M5459 Other low back pain: Secondary | ICD-10-CM | POA: Diagnosis not present

## 2024-06-19 DIAGNOSIS — M6281 Muscle weakness (generalized): Secondary | ICD-10-CM | POA: Diagnosis not present

## 2024-06-19 DIAGNOSIS — M25552 Pain in left hip: Secondary | ICD-10-CM

## 2024-06-19 NOTE — Therapy (Signed)
 OUTPATIENT PHYSICAL THERAPY TREATMENT   Patient Name: Tammy [name] MRN: [id number] DOB:01/12/1961, 63 y.o., female Today's Date: 06/19/2024   END OF SESSION:  PT End of Session - 06/19/24 0805     Visit Number 18    Number of Visits 24    Date for Recertification  07/31/24    Authorization Type [facility] [employer]    PT Start Time 0800    PT Stop Time 0840    PT Time Calculation (min) 40 min    Activity Tolerance Patient tolerated treatment well    Behavior During Therapy [facility] for tasks assessed/performed                           Past Medical History:  Diagnosis Date   Arthritis    Chronic fatigue syndrome    Diverticulosis    External hemorrhoids    Hypertension    Internal hemorrhoids    Neuropathy    Spinal stenosis    Past Surgical History:  Procedure Laterality Date   APPENDECTOMY     CARDIAC CATHETERIZATION  09/18/2012   LAPAROSCOPIC APPENDECTOMY N/A 01/22/2023   Procedure: APPENDECTOMY LAPAROSCOPIC;  Surgeon: Teresa Lonni HERO, MD;  Location: [facility];  Service: General;  Laterality: N/A;   LEFT HEART CATHETERIZATION WITH CORONARY ANGIOGRAM N/A 09/18/2012   Procedure: LEFT HEART CATHETERIZATION WITH CORONARY ANGIOGRAM;  Surgeon: Ozell Fell, MD;  Location: [facility] CATH LAB;  Service: Cardiovascular;  Laterality: N/A;   TONSILLECTOMY  09/21/1963   Patient Active Problem List   Diagnosis Date Noted   Prediabetes 12/28/2023   S/P laparoscopic appendectomy 01/22/2023   Unilateral primary osteoarthritis, left hip 06/25/2022   Hip flexor tendon tightness, left 04/02/2022   Greater trochanteric bursitis of left hip 01/11/2022   Anxiety 07/24/2020   Nonallopathic lesion of cervical region 04/03/2020   Nonallopathic lesion of thoracic region 04/03/2020   Nonallopathic lesion of lumbar region 04/03/2020   Cervical stenosis of spine 03/31/2020   Attention deficit disorder (ADD) in adult 03/31/2020   Low bone density 03/17/2020   Major depressive disorder  in full remission 02/15/2020   GERD (gastroesophageal reflux disease) 02/15/2020   Allergic rhinitis 03/09/2016   Essential hypertension 02/18/2014   Chronic pain syndrome 02/22/2013   Chronic insomnia 02/22/2013   Paresthesias 10/25/2012   Hyperlipidemia 09/18/2012    PCP: Katrinka Garnette KIDD, MD  REFERRING PROVIDER: Claudene Arthea HERO, DO  REFERRING DIAG: Left hip pain  THERAPY DIAG:  Pain in left hip  Other low back pain  Muscle weakness (generalized)  Rationale for Evaluation and Treatment: Rehabilitation  ONSET DATE: Chronic   SUBJECTIVE:  SUBJECTIVE STATEMENT: Patient reports her lower back has been bothering her more and the doctor ordered her an epidural injection. She has been doing her exercises at home but has not been to the gym to lift weights yet. She has continued to with her walking, she is walking 50 minutes, and she still does have pain at the end of the walk which calms down after a little bit of time. She has noticed some numbness and tingling going down the outside of the left leg with her lap swimming.   Eval: Patient reports left hip and leg pain that has been going on for a while. She used to walk an hour a day but now can't take walks so she has been doing more swimming. She reports a arthritic hip but also some sine issues that could be contributing to pain  going down the leg. She has had shots in the bursa of the leg hip but that has done nothing for her pain. She also had lumbar injection, the first one helped but the most recent one didn't do anything. She does occasionally get a catching pain in her lower back but it is not constant.The left hip and leg pain are more constant and were waking her up at night. She did have previous PT and the dry needling did not seem to help, and the other treatments did not seem to resolve the issue. Currently her most comfortable positions are lying down or standing, sitting or lying on left side seems to aggravate her pain  in addition to walking and stairs. She states she has difficulty leading with the left leg going up stairs. She does take Tylenol  when she needs to.   PERTINENT HISTORY: See PMH above  PAIN:  Are you having pain? Yes:  NPRS scale: 2/10 currently, 6/10 at end of 50 min walk Pain location: Left hip and leg Pain description: Sore, sharp, burning, catching/stabbing Aggravating factors: Walking, sitting, lying on left side, going up stairs Relieving factors: Lying down, standing  PRECAUTIONS: None  PATIENT GOALS: Be able to take long walks, at least 30-45 minutes   OBJECTIVE:  Note: Objective measures were completed at Evaluation unless otherwise noted. PATIENT SURVEYS:  PSFS: 6 Walking 30-60 minutes: 8 Stairs: 6 Sitting comfortably: 5 Weight training safely: 5  06/19/2024: PSFS: 7.5 Walking 30-60 minutes: 8 Stairs: 8 Sitting comfortably: 8 Weight training safely: 6  MUSCLE LENGTH: Hamstring limitation bilaterally Thomas test [facility], patient reports lateral hip and low back discomfort  PALPATION: Tenderness to left greater trochanteric region  Lumbar CPAs grossly WFL, patient reports localized soreness  LOWER EXTREMITY ROM:   Hip PROM grossly [facility]    Lumbar ROM grossly [facility]  LOWER EXTREMITY MMT:  MMT Right eval Left eval Left 02/27/2024 Left 03/30/2024 Left 04/17/2024 Left 04/24/2024 Left 05/04/2024 Left 05/15/2024 Left 06/19/2024  Hip flexion 5 4         Hip extension 4- 4-   4-    4-  Hip abduction 4 4- 4- 4- 4- 4- 4- 4- 4-  Hip adduction           Hip internal rotation           Hip external rotation           Knee flexion 5 4         Knee extension 5 5         Ankle dorsiflexion           Ankle plantarflexion           Ankle inversion           Ankle eversion            (Blank rows = not tested)  FUNCTIONAL TESTS:  DLLT: 60 deg Squat: grossly WFL with cueing  GAIT: Assistive device utilized: None Level of assistance: Complete Independence Comments:  Trendelenburg  TREATMENT  [facility] Adult PT Treatment:                                                DATE: 06/19/2024 Recumbent bike L5 x 5 min to improve endurance and workload capacity LTR 10 x 5 sec Cat cow x 10 Child's pose stretch 2 x 20 sec fwd and diagonal Prone press up x 10 Prone lumbar PA mobs at all levels Bird dog 2 x 10 x 5 sec Sidelying thoracic rotation x 10 each Thoracic extension over FR at various levels  Discussed gradual progression of her activity to continue to improve tolerance, importance of strengthening so trying to go to the gym for weight lifting, possible use of TPDN for lumbar spine. improving thoracic spine mobility, extending PT POC and upcoming epidural injection  PATIENT EDUCATION:  Education details: POC update, HEP update Person educated: Patient Education method: Explanation, Demonstration, Tactile cues, Verbal cues, Handout Education comprehension: verbalized understanding, returned demonstration, verbal cues required, tactile cues required, and needs further education  HOME EXERCISE PROGRAM: Access Code: [email or web address]   ASSESSMENT: CLINICAL IMPRESSION: Patient tolerated therapy well with no adverse effects. She arrives reporting increase in lower back pain since last visit so therapy focused more on spinal mobility and exercise with good tolerance. She does report stiffness and soreness with lumbar PA mobs and tenderness to bilateral lumbar paraspinals without any left sided referral. She continues to exhibit strength deficit of her gluteals and increased pain with activity. She does report an improvement in her functional status on PSFS. Updated her HEP to progress posterior chain engagement and core control. Patient would benefit from continued skilled PT to progress mobility and strength in order to reduce pain and maximize  functional ability, so will extend PT POC for 6 more weeks.   Eval: Patient is a 63 y.o. female who was seen today for physical therapy evaluation and treatment for chronic left hip and leg pain. 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 She does exhibit some left hip weakness compared to the right and tenderness over the left greater trochanter and hip musculature.   OBJECTIVE IMPAIRMENTS: decreased activity tolerance, decreased strength, impaired flexibility, and pain.   ACTIVITY LIMITATIONS: lifting, sitting, squatting, sleeping, stairs, and locomotion level  PARTICIPATION LIMITATIONS: shopping, community activity, and occupation  PERSONAL FACTORS: Fitness, Past/current experiences, and Time since onset of injury/illness/exacerbation are also affecting patient's functional outcome.    GOALS: Goals reviewed with patient? Yes  SHORT TERM GOALS: Target date: 07/17/2024  Patient will be I with initial HEP in order to progress with therapy. Baseline: HEP provided at eval 03/26/2024: independent with initial HEP Goal status: MET  2.  Patient will report left hip pain </= 5/10 with activity in order to reduce functional limitations Baseline: 7/10 pain 03/26/2024: continues to report pain with walking 06/19/2024: 6/10 pain Goal status: ONGOING  LONG TERM GOALS: Target date: 07/31/2024  Patient will be I with final HEP to maintain progress from PT. Baseline: HEP provided at eval 04/20/2024: progressing 06/19/2024: progressing Goal status: ONGOING  2.  Patient will report PSFS >/= 8 in order to indicate an improvement in functional ability Baseline: 6 04/20/2024: not assessed 06/19/2024: 7.5 Goal status: ONGOING  3.  Patient will demonstrate hip strength >/= 4+/5 MMT in order to improve her  walking tolerance and stair negotiation Baseline: see limitations above 04/20/2024: see above 06/19/2024: see above Goal status:  ONGOING  4.  Patient will report left hip pain </= 3/10 with activity in order to reduce functional limitations Baseline: 7/10 pain 04/20/2024: patient continues to report increase in left sided pain 06/19/2024: 6/10 pain Goal status: ONGOING   PLAN: PT FREQUENCY: 1x/week  PT DURATION: 6 weeks  PLANNED INTERVENTIONS: 97164- PT Re-evaluation, 97750- Physical Performance Testing, 97110-Therapeutic exercises, 97530- Therapeutic activity, 97112- Neuromuscular re-education, 97535- Self Care, 02859- Manual therapy, [phone]- Ionotophoresis 4mg /ml Dexamethasone , 79439 (1-2 muscles), 20561 (3+ muscles)- Dry Needling, Patient/Family education, Balance training, Stair training, Taping, Joint mobilization, Joint manipulation, Spinal manipulation, Spinal mobilization, Cryotherapy, and Moist heat  PLAN FOR NEXT SESSION: Review HEP and progress PRN, focus on hip strengthening and control, progressing to closed chain exercises    Elaine Daring, PT, DPT, LAT, ATC 06/19/24  8:46 AM Phone: [phone] Fax: [phone]

## 2024-06-19 NOTE — Patient Instructions (Signed)
 Access Code: [email or web address] URL: [email or web address] Date: 06/19/2024 Prepared by: Elaine Daring  Exercises - Half Kneeling Hip Flexor Stretch with Sidebend  - 1 x daily - 3 reps - 30 seconds hold - Straight Leg Raise with Ankle Weight  - 2-3 x weekly - 3 sets - 15 reps - Bridge with Hip Abduction and Resistance  - 2-3 x daily - 3 sets - 10 reps - 5 seconds hold - Sidelying Hip Abduction with Ankle Weight  - 2-3 x daily - 3 sets - 15 reps - Supine Dead Bug with Leg Extension  - 2-3 x daily - 3 sets - 20 reps - Side Stepping with Resistance at Ankles  - 2-3 x weekly - 3 sets - 20 reps - Standing Hip Abduction with Anchored Resistance  - 2-3 x daily - 3 sets - 10 reps - Standing Hip Extension with Anchored Resistance  - 2-3 x daily - 3 sets - 10 reps - Standing Hip Adduction with Anchored Resistance  - 2-3 x daily - 3 sets - 10 reps - Standing Hip Flexion with Anchored Resistance  - 2-3 x daily - 3 sets - 10 reps - Kettlebell Deadlift  - 2-3 x weekly - 3 sets - 10 reps - Runner's Step Up/Down  - 2-3 x weekly - 3 sets - 15 reps - Bird Dog  - 2-3 x weekly - 3 sets - 10 reps

## 2024-06-20 ENCOUNTER — Other Ambulatory Visit (HOSPITAL_BASED_OUTPATIENT_CLINIC_OR_DEPARTMENT_OTHER): Payer: Self-pay

## 2024-06-20 MED ORDER — COMIRNATY 30 MCG/0.3ML IM SUSY
0.3000 mL | PREFILLED_SYRINGE | Freq: Once | INTRAMUSCULAR | 0 refills | Status: AC
  Filled 2024-06-20: qty 0.3, 1d supply, fill #0

## 2024-06-21 DIAGNOSIS — F332 Major depressive disorder, recurrent severe without psychotic features: Secondary | ICD-10-CM | POA: Diagnosis not present

## 2024-06-23 ENCOUNTER — Other Ambulatory Visit (HOSPITAL_COMMUNITY): Payer: Self-pay

## 2024-06-25 ENCOUNTER — Other Ambulatory Visit: Payer: Self-pay

## 2024-06-25 DIAGNOSIS — Z6828 Body mass index (BMI) 28.0-28.9, adult: Secondary | ICD-10-CM | POA: Diagnosis not present

## 2024-06-25 DIAGNOSIS — R7303 Prediabetes: Secondary | ICD-10-CM | POA: Diagnosis not present

## 2024-06-25 DIAGNOSIS — E663 Overweight: Secondary | ICD-10-CM | POA: Diagnosis not present

## 2024-06-25 DIAGNOSIS — F50811 Binge eating disorder, moderate: Secondary | ICD-10-CM | POA: Diagnosis not present

## 2024-06-25 NOTE — Discharge Instructions (Signed)

## 2024-06-26 ENCOUNTER — Other Ambulatory Visit: Payer: Self-pay

## 2024-06-26 ENCOUNTER — Ambulatory Visit (INDEPENDENT_AMBULATORY_CARE_PROVIDER_SITE_OTHER): Admitting: Physical Therapy

## 2024-06-26 ENCOUNTER — Encounter: Payer: Self-pay | Admitting: Physical Therapy

## 2024-06-26 ENCOUNTER — Inpatient Hospital Stay
Admission: RE | Admit: 2024-06-26 | Discharge: 2024-06-26 | Disposition: A | Source: Ambulatory Visit | Attending: Family Medicine | Admitting: Family Medicine

## 2024-06-26 DIAGNOSIS — M25552 Pain in left hip: Secondary | ICD-10-CM | POA: Diagnosis not present

## 2024-06-26 DIAGNOSIS — M5459 Other low back pain: Secondary | ICD-10-CM

## 2024-06-26 DIAGNOSIS — M6281 Muscle weakness (generalized): Secondary | ICD-10-CM | POA: Diagnosis not present

## 2024-06-26 DIAGNOSIS — M4726 Other spondylosis with radiculopathy, lumbar region: Secondary | ICD-10-CM | POA: Diagnosis not present

## 2024-06-26 DIAGNOSIS — M5116 Intervertebral disc disorders with radiculopathy, lumbar region: Secondary | ICD-10-CM | POA: Diagnosis not present

## 2024-06-26 MED ORDER — METHYLPREDNISOLONE ACETATE 40 MG/ML INJ SUSP (RADIOLOG
80.0000 mg | Freq: Once | INTRAMUSCULAR | Status: AC
  Administered 2024-06-26: 80 mg via EPIDURAL

## 2024-06-26 MED ORDER — IOPAMIDOL (ISOVUE-M 200) INJECTION 41%
1.0000 mL | Freq: Once | INTRAMUSCULAR | Status: AC
  Administered 2024-06-26: 1 mL via EPIDURAL

## 2024-06-26 NOTE — Therapy (Signed)
 OUTPATIENT PHYSICAL THERAPY TREATMENT   Patient Name: Tammy [name] MRN: [id number] DOB:10-Sep-1961, 63 y.o., female Today's Date: 06/26/2024   END OF SESSION:  PT End of Session - 06/26/24 0803     Visit Number 19    Number of Visits 24    Date for Recertification  07/31/24    Authorization Type [facility] [employer]    PT Start Time 0800    PT Stop Time 0838    PT Time Calculation (min) 38 min    Activity Tolerance Patient tolerated treatment well    Behavior During Therapy [facility] for tasks assessed/performed                            Past Medical History:  Diagnosis Date   Arthritis    Chronic fatigue syndrome    Diverticulosis    External hemorrhoids    Hypertension    Internal hemorrhoids    Neuropathy    Spinal stenosis    Past Surgical History:  Procedure Laterality Date   APPENDECTOMY     CARDIAC CATHETERIZATION  09/18/2012   LAPAROSCOPIC APPENDECTOMY N/A 01/22/2023   Procedure: APPENDECTOMY LAPAROSCOPIC;  Surgeon: Teresa Lonni HERO, MD;  Location: [facility] OR;  Service: General;  Laterality: N/A;   LEFT HEART CATHETERIZATION WITH CORONARY ANGIOGRAM N/A 09/18/2012   Procedure: LEFT HEART CATHETERIZATION WITH CORONARY ANGIOGRAM;  Surgeon: Ozell Fell, MD;  Location: [facility] CATH LAB;  Service: Cardiovascular;  Laterality: N/A;   TONSILLECTOMY  09/21/1963   Patient Active Problem List   Diagnosis Date Noted   Prediabetes 12/28/2023   S/P laparoscopic appendectomy 01/22/2023   Unilateral primary osteoarthritis, left hip 06/25/2022   Hip flexor tendon tightness, left 04/02/2022   Greater trochanteric bursitis of left hip 01/11/2022   Anxiety 07/24/2020   Nonallopathic lesion of cervical region 04/03/2020   Nonallopathic lesion of thoracic region 04/03/2020   Nonallopathic lesion of lumbar region 04/03/2020   Cervical stenosis of spine 03/31/2020   Attention deficit disorder (ADD) in adult 03/31/2020   Low bone density 03/17/2020   Major depressive  disorder in full remission 02/15/2020   GERD (gastroesophageal reflux disease) 02/15/2020   Allergic rhinitis 03/09/2016   Essential hypertension 02/18/2014   Chronic pain syndrome 02/22/2013   Chronic insomnia 02/22/2013   Paresthesias 10/25/2012   Hyperlipidemia 09/18/2012    PCP: Katrinka Garnette KIDD, MD  REFERRING PROVIDER: Claudene Arthea HERO, DO  REFERRING DIAG: Left hip pain  THERAPY DIAG:  Pain in left hip  Other low back pain  Muscle weakness (generalized)  Rationale for Evaluation and Treatment: Rehabilitation  ONSET DATE: Chronic   SUBJECTIVE:  SUBJECTIVE STATEMENT: Patient reports she continues to do her 50 minute walks and her left leg will still hurt at the end of the walks. She still has not gone to the gym to do the resistance training exercises.    Eval: Patient reports left hip and leg pain that has been going on for a while. She used to walk an hour a day but now can't take walks so she has been doing more swimming. She reports a arthritic hip but also some sine issues that could be contributing to pain going down the leg. She has had shots in the bursa of the leg hip but that has done nothing for her pain. She also had lumbar injection, the first one helped but the most recent one didn't do anything. She does occasionally get a catching pain in her lower  back but it is not constant.The left hip and leg pain are more constant and were waking her up at night. She did have previous PT and the dry needling did not seem to help, and the other treatments did not seem to resolve the issue. Currently her most comfortable positions are lying down or standing, sitting or lying on left side seems to aggravate her pain in addition to walking and stairs. She states she has difficulty leading with the left leg going up stairs. She does take Tylenol  when she needs to.   PERTINENT HISTORY: See PMH above  PAIN:  Are you having pain? Yes:  NPRS scale: 2/10 currently, 6/10 at end of  50 min walk Pain location: Left hip and leg Pain description: Sore, sharp, burning, catching/stabbing Aggravating factors: Walking, sitting, lying on left side, going up stairs Relieving factors: Lying down, standing  PRECAUTIONS: None  PATIENT GOALS: Be able to take long walks, at least 30-45 minutes   OBJECTIVE:  Note: Objective measures were completed at Evaluation unless otherwise noted. PATIENT SURVEYS:  PSFS: 6 Walking 30-60 minutes: 8 Stairs: 6 Sitting comfortably: 5 Weight training safely: 5  06/19/2024: PSFS: 7.5 Walking 30-60 minutes: 8 Stairs: 8 Sitting comfortably: 8 Weight training safely: 6  MUSCLE LENGTH: Hamstring limitation bilaterally Thomas test [facility], patient reports lateral hip and low back discomfort  PALPATION: Tenderness to left greater trochanteric region  Lumbar CPAs grossly WFL, patient reports localized soreness  LOWER EXTREMITY ROM:   Hip PROM grossly [facility]    Lumbar ROM grossly [facility]  LOWER EXTREMITY MMT:  MMT Right eval Left eval Left 02/27/2024 Left 03/30/2024 Left 04/17/2024 Left 04/24/2024 Left 05/04/2024 Left 05/15/2024 Left 06/19/2024  Hip flexion 5 4         Hip extension 4- 4-   4-    4-  Hip abduction 4 4- 4- 4- 4- 4- 4- 4- 4-  Hip adduction           Hip internal rotation           Hip external rotation           Knee flexion 5 4         Knee extension 5 5         Ankle dorsiflexion           Ankle plantarflexion           Ankle inversion           Ankle eversion            (Blank rows = not tested)  FUNCTIONAL TESTS:  DLLT: 60 deg Squat: grossly WFL with cueing  GAIT: Assistive device utilized: None Level of assistance: Complete Independence Comments: Trendelenburg                                                                                                                               TREATMENT  [facility] Adult PT Treatment:  DATE: 06/26/2024 Recumbent bike L5 x 5 min to  improve endurance and workload capacity Bridge with blue at knees 3 x 12 Bird dog 2 x 10 x 5 sec Quadruped hydrant with blue 2 x 10 each Deadlift with 35# 3 x 10 Pallof press with L2 powerband 2 x 10 each Lateral band walk with blue at knees 3 x 20 down/back Modified side plank on knees 2 x 10 x 5 sec each  PATIENT EDUCATION:  Education details: HEP Person educated: Patient Education method: Programmer, multimedia, Demonstration, Actor cues, Verbal cues Education comprehension: verbalized understanding, returned demonstration, verbal cues required, tactile cues required, and needs further education  HOME EXERCISE PROGRAM: Access Code: [email or web address]   ASSESSMENT: CLINICAL IMPRESSION: Patient tolerated therapy well with no adverse effects. Therapy focused primarily on progressing her hip and core strengthening and endurance with good tolerance. She was able to progress with banded resistance this visit and continued with her lifting without increased left hip/leg pain. She does continue to report left leg pain toward the end of her walks and she has not been completing resistance training at the gym. No changes made to her HEP but she was provided with stronger band and encouraged to performing resistance and weight training at the gym to progress her strength and activity tolerance. Patient would benefit from continued skilled PT to progress mobility and strength in order to reduce pain and maximize functional ability.   Eval: Patient is a 63 y.o. female who was seen today for physical therapy evaluation and treatment for chronic left hip and leg pain. 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 She does exhibit some left hip weakness compared to the right and tenderness over the left greater trochanter and hip musculature.   OBJECTIVE IMPAIRMENTS: decreased activity tolerance, decreased strength, impaired flexibility, and pain.   ACTIVITY  LIMITATIONS: lifting, sitting, squatting, sleeping, stairs, and locomotion level  PARTICIPATION LIMITATIONS: shopping, community activity, and occupation  PERSONAL FACTORS: Fitness, Past/current experiences, and Time since onset of injury/illness/exacerbation are also affecting patient's functional outcome.    GOALS: Goals reviewed with patient? Yes  SHORT TERM GOALS: Target date: 07/17/2024  Patient will be I with initial HEP in order to progress with therapy. Baseline: HEP provided at eval 03/26/2024: independent with initial HEP Goal status: MET  2.  Patient will report left hip pain </= 5/10 with activity in order to reduce functional limitations Baseline: 7/10 pain 03/26/2024: continues to report pain with walking 06/19/2024: 6/10 pain Goal status: ONGOING  LONG TERM GOALS: Target date: 07/31/2024  Patient will be I with final HEP to maintain progress from PT. Baseline: HEP provided at eval 04/20/2024: progressing 06/19/2024: progressing Goal status: ONGOING  2.  Patient will report PSFS >/= 8 in order to indicate an improvement in functional ability Baseline: 6 04/20/2024: not assessed 06/19/2024: 7.5 Goal status: ONGOING  3.  Patient will demonstrate hip strength >/= 4+/5 MMT in order to improve her walking tolerance and stair negotiation Baseline: see limitations above 04/20/2024: see above 06/19/2024: see above Goal status: ONGOING  4.  Patient will report left hip pain </= 3/10 with activity in order to reduce functional limitations Baseline: 7/10 pain 04/20/2024: patient continues to report increase in left sided pain 06/19/2024: 6/10 pain Goal status: ONGOING   PLAN: PT FREQUENCY: 1x/week  PT DURATION: 6 weeks  PLANNED INTERVENTIONS: 97164- PT Re-evaluation, 97750- Physical Performance Testing, 97110-Therapeutic exercises, 97530- Therapeutic activity, [id number]- Neuromuscular re-education, 97535- Self  Care, 02859- Manual therapy, [phone]- Ionotophoresis 4mg /ml  Dexamethasone , 20560 (1-2 muscles), 20561 (3+ muscles)- Dry Needling, Patient/Family education, Balance training, Stair training, Taping, Joint mobilization, Joint manipulation, Spinal manipulation, Spinal mobilization, Cryotherapy, and Moist heat  PLAN FOR NEXT SESSION: Review HEP and progress PRN, focus on hip strengthening and control, progressing to closed chain exercises    Elaine Daring, PT, DPT, LAT, ATC 06/26/24  8:46 AM Phone: [phone] Fax: [phone]

## 2024-06-29 DIAGNOSIS — H5213 Myopia, bilateral: Secondary | ICD-10-CM | POA: Diagnosis not present

## 2024-07-03 ENCOUNTER — Ambulatory Visit (INDEPENDENT_AMBULATORY_CARE_PROVIDER_SITE_OTHER): Admitting: Physical Therapy

## 2024-07-03 ENCOUNTER — Encounter: Payer: Self-pay | Admitting: Physical Therapy

## 2024-07-03 ENCOUNTER — Other Ambulatory Visit: Payer: Self-pay

## 2024-07-03 DIAGNOSIS — M6281 Muscle weakness (generalized): Secondary | ICD-10-CM

## 2024-07-03 DIAGNOSIS — M25552 Pain in left hip: Secondary | ICD-10-CM

## 2024-07-03 DIAGNOSIS — M5459 Other low back pain: Secondary | ICD-10-CM

## 2024-07-03 NOTE — Therapy (Signed)
 OUTPATIENT PHYSICAL THERAPY TREATMENT   Patient Name: Tammy [name] MRN: [id number] DOB:1961/02/26, 63 y.o., female Today's Date: 07/03/2024   END OF SESSION:  PT End of Session - 07/03/24 0805     Visit Number 20    Number of Visits 24    Date for Recertification  07/31/24    Authorization Type [facility] [employer]    PT Start Time 0802    PT Stop Time 0843    PT Time Calculation (min) 41 min    Activity Tolerance Patient tolerated treatment well    Behavior During Therapy [facility] for tasks assessed/performed                             Past Medical History:  Diagnosis Date   Arthritis    Chronic fatigue syndrome    Diverticulosis    External hemorrhoids    Hypertension    Internal hemorrhoids    Neuropathy    Spinal stenosis    Past Surgical History:  Procedure Laterality Date   APPENDECTOMY     CARDIAC CATHETERIZATION  09/18/2012   LAPAROSCOPIC APPENDECTOMY N/A 01/22/2023   Procedure: APPENDECTOMY LAPAROSCOPIC;  Surgeon: Teresa Lonni HERO, MD;  Location: [facility];  Service: General;  Laterality: N/A;   LEFT HEART CATHETERIZATION WITH CORONARY ANGIOGRAM N/A 09/18/2012   Procedure: LEFT HEART CATHETERIZATION WITH CORONARY ANGIOGRAM;  Surgeon: Ozell Fell, MD;  Location: [facility] CATH LAB;  Service: Cardiovascular;  Laterality: N/A;   TONSILLECTOMY  09/21/1963   Patient Active Problem List   Diagnosis Date Noted   Prediabetes 12/28/2023   S/P laparoscopic appendectomy 01/22/2023   Unilateral primary osteoarthritis, left hip 06/25/2022   Hip flexor tendon tightness, left 04/02/2022   Greater trochanteric bursitis of left hip 01/11/2022   Anxiety 07/24/2020   Nonallopathic lesion of cervical region 04/03/2020   Nonallopathic lesion of thoracic region 04/03/2020   Nonallopathic lesion of lumbar region 04/03/2020   Cervical stenosis of spine 03/31/2020   Attention deficit disorder (ADD) in adult 03/31/2020   Low bone density 03/17/2020   Major depressive  disorder in full remission 02/15/2020   GERD (gastroesophageal reflux disease) 02/15/2020   Allergic rhinitis 03/09/2016   Essential hypertension 02/18/2014   Chronic pain syndrome 02/22/2013   Chronic insomnia 02/22/2013   Paresthesias 10/25/2012   Hyperlipidemia 09/18/2012    PCP: Katrinka Garnette KIDD, MD  REFERRING PROVIDER: Claudene Arthea HERO, DO  REFERRING DIAG: Left hip pain  THERAPY DIAG:  Pain in left hip  Other low back pain  Muscle weakness (generalized)  Rationale for Evaluation and Treatment: Rehabilitation  ONSET DATE: Chronic   SUBJECTIVE:  SUBJECTIVE STATEMENT: Patient reports she got an injection last week and thinks she can see some improvement. She did sign up at the gym to meet with a trainer on 10/30.  Eval: Patient reports left hip and leg pain that has been going on for a while. She used to walk an hour a day but now can't take walks so she has been doing more swimming. She reports a arthritic hip but also some sine issues that could be contributing to pain going down the leg. She has had shots in the bursa of the leg hip but that has done nothing for her pain. She also had lumbar injection, the first one helped but the most recent one didn't do anything. She does occasionally get a catching pain in her lower back but it is not constant.The left hip and  leg pain are more constant and were waking her up at night. She did have previous PT and the dry needling did not seem to help, and the other treatments did not seem to resolve the issue. Currently her most comfortable positions are lying down or standing, sitting or lying on left side seems to aggravate her pain in addition to walking and stairs. She states she has difficulty leading with the left leg going up stairs. She does take Tylenol  when she needs to.   PERTINENT HISTORY: See PMH above  PAIN:  Are you having pain? Yes:  NPRS scale: 2/10 currently, 6/10 at end of 50 min walk Pain location: Left hip and  leg Pain description: Sore, sharp, burning, catching/stabbing Aggravating factors: Walking, sitting, lying on left side, going up stairs Relieving factors: Lying down, standing  PRECAUTIONS: None  PATIENT GOALS: Be able to take long walks, at least 30-45 minutes   OBJECTIVE:  Note: Objective measures were completed at Evaluation unless otherwise noted. PATIENT SURVEYS:  PSFS: 6 Walking 30-60 minutes: 8 Stairs: 6 Sitting comfortably: 5 Weight training safely: 5  06/19/2024: PSFS: 7.5 Walking 30-60 minutes: 8 Stairs: 8 Sitting comfortably: 8 Weight training safely: 6  MUSCLE LENGTH: Hamstring limitation bilaterally Thomas test [facility], patient reports lateral hip and low back discomfort  PALPATION: Tenderness to left greater trochanteric region  Lumbar CPAs grossly WFL, patient reports localized soreness  LOWER EXTREMITY ROM:   Hip PROM grossly [facility]    Lumbar ROM grossly [facility]  LOWER EXTREMITY MMT:  MMT Right eval Left eval Left 02/27/2024 Left 03/30/2024 Left 04/17/2024 Left 04/24/2024 Left 05/04/2024 Left 05/15/2024 Left 06/19/2024  Hip flexion 5 4         Hip extension 4- 4-   4-    4-  Hip abduction 4 4- 4- 4- 4- 4- 4- 4- 4-  Hip adduction           Hip internal rotation           Hip external rotation           Knee flexion 5 4         Knee extension 5 5         Ankle dorsiflexion           Ankle plantarflexion           Ankle inversion           Ankle eversion            (Blank rows = not tested)  FUNCTIONAL TESTS:  DLLT: 60 deg Squat: grossly WFL with cueing  GAIT: Assistive device utilized: None Level of assistance: Complete Independence Comments: Trendelenburg                                                                                                                               TREATMENT  [facility] Adult PT Treatment:  DATE: 07/03/2024 Recumbent bike L5 x 5 min to improve endurance and workload  capacity Bridge x 10 Figure-4 bridge 2 x 10 Dead bug 2 x 20 Sidelying hip abduction with 2# 2 x 15 each Deadlift with 35# 3 x 10 Pallof press with blue 2 x 10 each Lateral 8 step-up and over 2 x 10 TRX squat 3 x 15  PATIENT EDUCATION:  Education details: HEP Person educated: Patient Education method: Programmer, multimedia, Demonstration, Actor cues, Verbal cues Education comprehension: verbalized understanding, returned demonstration, verbal cues required, tactile cues required, and needs further education  HOME EXERCISE PROGRAM: Access Code: [email or web address]   ASSESSMENT: CLINICAL IMPRESSION: Patient tolerated therapy well with no adverse effects. Therapy focused on core stabilization and strengthening for the hips and LE with good tolerance. She continues to progress well with her strengthening exercises and updated her HEP to incorporate squatting for strength and endurance. She did sign up to meet with a trainer at the gym so will hopefully be able to incorporate more resistance training into her routine to progress strengthening. Patient would benefit from continued skilled PT to progress mobility and strength in order to reduce pain and maximize functional ability.   Eval: Patient is a 63 y.o. female who was seen today for physical therapy evaluation and treatment for chronic left hip and leg pain. 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 She does exhibit some left hip weakness compared to the right and tenderness over the left greater trochanter and hip musculature.   OBJECTIVE IMPAIRMENTS: decreased activity tolerance, decreased strength, impaired flexibility, and pain.   ACTIVITY LIMITATIONS: lifting, sitting, squatting, sleeping, stairs, and locomotion level  PARTICIPATION LIMITATIONS: shopping, community activity, and occupation  PERSONAL FACTORS: Fitness, Past/current experiences, and Time since onset of  injury/illness/exacerbation are also affecting patient's functional outcome.    GOALS: Goals reviewed with patient? Yes  SHORT TERM GOALS: Target date: 07/17/2024  Patient will be I with initial HEP in order to progress with therapy. Baseline: HEP provided at eval 03/26/2024: independent with initial HEP Goal status: MET  2.  Patient will report left hip pain </= 5/10 with activity in order to reduce functional limitations Baseline: 7/10 pain 03/26/2024: continues to report pain with walking 06/19/2024: 6/10 pain Goal status: ONGOING  LONG TERM GOALS: Target date: 07/31/2024  Patient will be I with final HEP to maintain progress from PT. Baseline: HEP provided at eval 04/20/2024: progressing 06/19/2024: progressing Goal status: ONGOING  2.  Patient will report PSFS >/= 8 in order to indicate an improvement in functional ability Baseline: 6 04/20/2024: not assessed 06/19/2024: 7.5 Goal status: ONGOING  3.  Patient will demonstrate hip strength >/= 4+/5 MMT in order to improve her walking tolerance and stair negotiation Baseline: see limitations above 04/20/2024: see above 06/19/2024: see above Goal status: ONGOING  4.  Patient will report left hip pain </= 3/10 with activity in order to reduce functional limitations Baseline: 7/10 pain 04/20/2024: patient continues to report increase in left sided pain 06/19/2024: 6/10 pain Goal status: ONGOING   PLAN: PT FREQUENCY: 1x/week  PT DURATION: 6 weeks  PLANNED INTERVENTIONS: 97164- PT Re-evaluation, 97750- Physical Performance Testing, 97110-Therapeutic exercises, 97530- Therapeutic activity, 97112- Neuromuscular re-education, 97535- Self Care, 02859- Manual therapy, [phone]- Ionotophoresis 4mg /ml Dexamethasone , 79439 (1-2 muscles), 20561 (3+ muscles)- Dry Needling, Patient/Family education, Balance training, Stair training, Taping, Joint mobilization, Joint manipulation, Spinal manipulation, Spinal mobilization, Cryotherapy, and Moist  heat  PLAN FOR NEXT SESSION:  Review HEP and progress PRN, focus on hip strengthening and control, progressing to closed chain exercises    Elaine Daring, PT, DPT, LAT, ATC 07/03/24  8:46 AM Phone: [phone] Fax: [phone]

## 2024-07-03 NOTE — Patient Instructions (Signed)
 Access Code: [email or web address] URL: [email or web address] Date: 07/03/2024 Prepared by: Elaine Daring  Exercises - Half Kneeling Hip Flexor Stretch with Sidebend  - 1 x daily - 3 reps - 30 seconds hold - Straight Leg Raise with Ankle Weight  - 2-3 x weekly - 3 sets - 15 reps - Bridge with Hip Abduction and Resistance  - 2-3 x daily - 3 sets - 10 reps - 5 seconds hold - Sidelying Hip Abduction with Ankle Weight  - 2-3 x daily - 3 sets - 15 reps - Supine Dead Bug with Leg Extension  - 2-3 x daily - 3 sets - 20 reps - Bird Dog  - 2-3 x weekly - 3 sets - 10 reps - Side Stepping with Resistance at Ankles  - 2-3 x weekly - 3 sets - 20 reps - Squat with Chair Touch  - 2-3 x weekly - 3 sets - 15 reps - Kettlebell Deadlift  - 2-3 x weekly - 3 sets - 10 reps - Runner's Step Up/Down  - 2-3 x weekly - 3 sets - 15 reps

## 2024-07-05 DIAGNOSIS — F332 Major depressive disorder, recurrent severe without psychotic features: Secondary | ICD-10-CM | POA: Diagnosis not present

## 2024-07-10 ENCOUNTER — Other Ambulatory Visit: Payer: Self-pay

## 2024-07-10 ENCOUNTER — Encounter: Admitting: Physical Therapy

## 2024-07-11 ENCOUNTER — Ambulatory Visit (HOSPITAL_BASED_OUTPATIENT_CLINIC_OR_DEPARTMENT_OTHER)
Admission: RE | Admit: 2024-07-11 | Discharge: 2024-07-11 | Disposition: A | Discharge status: Home / Self Care | Source: Ambulatory Visit | Attending: Family Medicine | Admitting: Family Medicine

## 2024-07-11 DIAGNOSIS — Z1231 Encounter for screening mammogram for malignant neoplasm of breast: Secondary | ICD-10-CM | POA: Diagnosis not present

## 2024-07-17 ENCOUNTER — Encounter: Payer: Self-pay | Admitting: Physical Therapy

## 2024-07-17 ENCOUNTER — Ambulatory Visit (INDEPENDENT_AMBULATORY_CARE_PROVIDER_SITE_OTHER): Admitting: Physical Therapy

## 2024-07-17 ENCOUNTER — Other Ambulatory Visit: Payer: Self-pay

## 2024-07-17 DIAGNOSIS — M25552 Pain in left hip: Secondary | ICD-10-CM | POA: Diagnosis not present

## 2024-07-17 DIAGNOSIS — M5459 Other low back pain: Secondary | ICD-10-CM

## 2024-07-17 DIAGNOSIS — M6281 Muscle weakness (generalized): Secondary | ICD-10-CM

## 2024-07-17 NOTE — Patient Instructions (Signed)
 Access Code: [email or web address] URL: [email or web address] Date: 07/17/2024 Prepared by: Elaine Daring  Exercises - Half Kneeling Hip Flexor Stretch with Sidebend  - 1 x daily - 3 reps - 30 seconds hold - Straight Leg Raise with Ankle Weight  - 2-3 x weekly - 3 sets - 15 reps - Bridge with Hip Abduction and Resistance  - 2-3 x daily - 3 sets - 10 reps - 5 seconds hold - Sidelying Hip Abduction with Ankle Weight  - 2-3 x daily - 3 sets - 15 reps - Supine Dead Bug with Leg Extension  - 2-3 x daily - 3 sets - 20 reps - Bird Dog  - 2-3 x weekly - 3 sets - 10 reps - Side Stepping with Resistance at Ankles  - 2-3 x weekly - 3 sets - 20 reps - Squat with Chair Touch  - 2-3 x weekly - 3 sets - 15 reps - Kettlebell Deadlift  - 2-3 x weekly - 3 sets - 10 reps - Runner's Step Up/Down  - 2-3 x weekly - 3 sets - 15 reps

## 2024-07-17 NOTE — Therapy (Signed)
 OUTPATIENT PHYSICAL THERAPY TREATMENT  DISCHARGE   Patient Name: Tammy [name] MRN: [id number] DOB:Dec 21, 1960, 63 y.o., female Today's Date: 07/17/2024   END OF SESSION:  PT End of Session - 07/17/24 0821     Visit Number 21    Number of Visits 24    Date for Recertification  07/31/24    Authorization Type [facility] [employer]    PT Start Time 0757    PT Stop Time 0835    PT Time Calculation (min) 38 min    Activity Tolerance Patient tolerated treatment well    Behavior During Therapy [facility] for tasks assessed/performed                              Past Medical History:  Diagnosis Date   Arthritis    Chronic fatigue syndrome    Diverticulosis    External hemorrhoids    Hypertension    Internal hemorrhoids    Neuropathy    Spinal stenosis    Past Surgical History:  Procedure Laterality Date   APPENDECTOMY     CARDIAC CATHETERIZATION  09/18/2012   LAPAROSCOPIC APPENDECTOMY N/A 01/22/2023   Procedure: APPENDECTOMY LAPAROSCOPIC;  Surgeon: Teresa Lonni HERO, MD;  Location: [facility] OR;  Service: General;  Laterality: N/A;   LEFT HEART CATHETERIZATION WITH CORONARY ANGIOGRAM N/A 09/18/2012   Procedure: LEFT HEART CATHETERIZATION WITH CORONARY ANGIOGRAM;  Surgeon: Ozell Fell, MD;  Location: [facility] CATH LAB;  Service: Cardiovascular;  Laterality: N/A;   TONSILLECTOMY  09/21/1963   Patient Active Problem List   Diagnosis Date Noted   Prediabetes 12/28/2023   S/P laparoscopic appendectomy 01/22/2023   Unilateral primary osteoarthritis, left hip 06/25/2022   Hip flexor tendon tightness, left 04/02/2022   Greater trochanteric bursitis of left hip 01/11/2022   Anxiety 07/24/2020   Nonallopathic lesion of cervical region 04/03/2020   Nonallopathic lesion of thoracic region 04/03/2020   Nonallopathic lesion of lumbar region 04/03/2020   Cervical stenosis of spine 03/31/2020   Attention deficit disorder (ADD) in adult 03/31/2020   Low bone density 03/17/2020   Major  depressive disorder in full remission 02/15/2020   GERD (gastroesophageal reflux disease) 02/15/2020   Allergic rhinitis 03/09/2016   Essential hypertension 02/18/2014   Chronic pain syndrome 02/22/2013   Chronic insomnia 02/22/2013   Paresthesias 10/25/2012   Hyperlipidemia 09/18/2012    PCP: Katrinka Garnette KIDD, MD  REFERRING PROVIDER: Claudene Arthea HERO, DO  REFERRING DIAG: Left hip pain  THERAPY DIAG:  Pain in left hip  Other low back pain  Muscle weakness (generalized)  Rationale for Evaluation and Treatment: Rehabilitation  ONSET DATE: Chronic   SUBJECTIVE:  SUBJECTIVE STATEMENT: Patient reports she has her personal trainer appointment on Thursday so will begin to transition to gym strengthening.  Eval: Patient reports left hip and leg pain that has been going on for a while. She used to walk an hour a day but now can't take walks so she has been doing more swimming. She reports a arthritic hip but also some sine issues that could be contributing to pain going down the leg. She has had shots in the bursa of the leg hip but that has done nothing for her pain. She also had lumbar injection, the first one helped but the most recent one didn't do anything. She does occasionally get a catching pain in her lower back but it is not constant.The left hip and leg pain are more constant and were waking  her up at night. She did have previous PT and the dry needling did not seem to help, and the other treatments did not seem to resolve the issue. Currently her most comfortable positions are lying down or standing, sitting or lying on left side seems to aggravate her pain in addition to walking and stairs. She states she has difficulty leading with the left leg going up stairs. She does take Tylenol  when she needs to.   PERTINENT HISTORY: See PMH above  PAIN:  Are you having pain? Yes:  NPRS scale: 0/10 currently, 4/10 at end of 50 min walk Pain location: Left hip and leg Pain  description: Sore, sharp, burning, catching/stabbing Aggravating factors: Walking, sitting, lying on left side, going up stairs Relieving factors: Lying down, standing  PRECAUTIONS: None  PATIENT GOALS: Be able to take long walks, at least 30-45 minutes   OBJECTIVE:  Note: Objective measures were completed at Evaluation unless otherwise noted. PATIENT SURVEYS:  PSFS: 6 Walking 30-60 minutes: 8 Stairs: 6 Sitting comfortably: 5 Weight training safely: 5  06/19/2024: PSFS: 7.5 Walking 30-60 minutes: 8 Stairs: 8 Sitting comfortably: 8 Weight training safely: 6  07/17/2024 PSFS: 8.25 Walking 30-60 minutes: 9 Stairs: 9 Sitting comfortably: 8 Weight training safely: 7  MUSCLE LENGTH: Hamstring limitation bilaterally Thomas test [facility], patient reports lateral hip and low back discomfort  PALPATION: Tenderness to left greater trochanteric region  Lumbar CPAs grossly WFL, patient reports localized soreness  LOWER EXTREMITY ROM:   Hip PROM grossly [facility]    Lumbar ROM grossly [facility]  LOWER EXTREMITY MMT:  MMT Right eval Left eval Left 02/27/2024 Left 03/30/2024 Left 04/17/2024 Left 04/24/2024 Left 05/04/2024 Left 05/15/2024 Left 06/19/2024 Rt / Lt 07/17/2024  Hip flexion 5 4          Hip extension 4- 4-   4-    4- 4 / 4  Hip abduction 4 4- 4- 4- 4- 4- 4- 4- 4- 4 / 4  Hip adduction            Hip internal rotation            Hip external rotation            Knee flexion 5 4          Knee extension 5 5          Ankle dorsiflexion            Ankle plantarflexion            Ankle inversion            Ankle eversion             (Blank rows = not tested)  FUNCTIONAL TESTS:  DLLT: 60 deg Squat: grossly WFL with cueing  GAIT: Assistive device utilized: None Level of assistance: Complete Independence Comments: Trendelenburg  TREATMENT  [facility] Adult  PT Treatment:                                                DATE: 07/17/2024 Recumbent bike L5 x 5 min to improve endurance and workload capacity Bridge with blue band 3 x 10 Dead bug 3 x 10 Goblet squat to table tap with 15# 3 x 10 Lateral band walk with blue at knees 3 x 20 down/back Deadlift with 35# 3 x 8 Pallof press 2 x 10 Forward 10 runner step-up 2 x 10  Discussed her transition to gym exercises and progression with weights. Patient was provided a note to provide to her trainer to inform exercise prescription and progression.  PATIENT EDUCATION:  Education details: POC discharge, HEP Person educated: Patient Education method: Explanation Education comprehension: Verbalized understanding  HOME EXERCISE PROGRAM: Access Code: [email or web address]   ASSESSMENT: CLINICAL IMPRESSION: Patient tolerated therapy well with no adverse effects. She has made great progress in therapy and reports improvement in her walking tolerance and functional status. She does continue to have some hip strength deficits and left hip pain after long periods of walking. She will transition to independent HEP and will begin resistive strengthening exercises at the gym. Patient will be formally discharged from PT at this time.   Eval: Patient is a 63 y.o. female who was seen today for physical therapy evaluation and treatment for chronic left hip and leg pain. 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 She does exhibit some left hip weakness compared to the right and tenderness over the left greater trochanter and hip musculature.   OBJECTIVE IMPAIRMENTS: decreased activity tolerance, decreased strength, impaired flexibility, and pain.   ACTIVITY LIMITATIONS: lifting, sitting, squatting, sleeping, stairs, and locomotion level  PARTICIPATION LIMITATIONS: shopping, community activity, and occupation  PERSONAL FACTORS: Fitness, Past/current  experiences, and Time since onset of injury/illness/exacerbation are also affecting patient's functional outcome.    GOALS: Goals reviewed with patient? Yes  SHORT TERM GOALS: Target date: 07/17/2024  Patient will be I with initial HEP in order to progress with therapy. Baseline: HEP provided at eval 03/26/2024: independent with initial HEP Goal status: MET  2.  Patient will report left hip pain </= 5/10 with activity in order to reduce functional limitations Baseline: 7/10 pain 03/26/2024: continues to report pain with walking 06/19/2024: 6/10 pain 07/17/2024: 4/10 Goal status: MET  LONG TERM GOALS: Target date: 07/31/2024  Patient will be I with final HEP to maintain progress from PT. Baseline: HEP provided at eval 04/20/2024: progressing 06/19/2024: progressing 07/17/2024: independent Goal status: MET  2.  Patient will report PSFS >/= 8 in order to indicate an improvement in functional ability Baseline: 6 04/20/2024: not assessed 06/19/2024: 7.5 07/17/2024: 8.5 Goal status: MET  3.  Patient will demonstrate hip strength >/= 4+/5 MMT in order to improve her walking tolerance and stair negotiation Baseline: see limitations above 04/20/2024: see above 06/19/2024: see above 07/17/2024: see above Goal status: PARTIALLY MET  4.  Patient will report left hip pain </= 3/10 with activity in order to reduce functional limitations Baseline: 7/10 pain 04/20/2024: patient continues to report increase in left sided pain 06/19/2024: 6/10 pain 07/17/2024: 4/10 Goal status: PARTIALLY MET   PLAN: PT FREQUENCY: 1x/week  PT DURATION: 6 weeks  PLANNED INTERVENTIONS: 02835- PT  Re-evaluation, 97750- Physical Performance Testing, 97110-Therapeutic exercises, 97530- Therapeutic activity, 97112- Neuromuscular re-education, [phone]- Self Care, 02859- Manual therapy, [phone]- Ionotophoresis 4mg /ml Dexamethasone , 20560 (1-2 muscles), 20561 (3+ muscles)- Dry Needling, Patient/Family education, Balance  training, Stair training, Taping, Joint mobilization, Joint manipulation, Spinal manipulation, Spinal mobilization, Cryotherapy, and Moist heat  PLAN FOR NEXT SESSION: NA - discharge    Elaine Daring, PT, DPT, LAT, ATC 07/17/24  8:44 AM Phone: [phone] Fax: [phone]   PHYSICAL THERAPY DISCHARGE SUMMARY  Visits from Start of Care: 21  Current functional level related to goals / functional outcomes: See above   Remaining deficits: See above   Education / Equipment: HEP   Patient agrees to discharge. Patient goals were partially met. Patient is being discharged due to being pleased with the current functional level.

## 2024-07-19 DIAGNOSIS — Z6829 Body mass index (BMI) 29.0-29.9, adult: Secondary | ICD-10-CM | POA: Diagnosis not present

## 2024-07-19 DIAGNOSIS — Z01419 Encounter for gynecological examination (general) (routine) without abnormal findings: Secondary | ICD-10-CM | POA: Diagnosis not present

## 2024-07-19 DIAGNOSIS — F332 Major depressive disorder, recurrent severe without psychotic features: Secondary | ICD-10-CM | POA: Diagnosis not present

## 2024-07-19 DIAGNOSIS — Z124 Encounter for screening for malignant neoplasm of cervix: Secondary | ICD-10-CM | POA: Diagnosis not present

## 2024-07-19 DIAGNOSIS — Z13820 Encounter for screening for osteoporosis: Secondary | ICD-10-CM | POA: Diagnosis not present

## 2024-07-19 LAB — HM DEXA SCAN

## 2024-07-23 DIAGNOSIS — R7303 Prediabetes: Secondary | ICD-10-CM | POA: Diagnosis not present

## 2024-07-23 DIAGNOSIS — R632 Polyphagia: Secondary | ICD-10-CM | POA: Diagnosis not present

## 2024-07-23 DIAGNOSIS — Z6828 Body mass index (BMI) 28.0-28.9, adult: Secondary | ICD-10-CM | POA: Diagnosis not present

## 2024-07-23 DIAGNOSIS — F50811 Binge eating disorder, moderate: Secondary | ICD-10-CM | POA: Diagnosis not present

## 2024-07-23 DIAGNOSIS — E663 Overweight: Secondary | ICD-10-CM | POA: Diagnosis not present

## 2024-07-30 ENCOUNTER — Other Ambulatory Visit: Payer: Self-pay

## 2024-08-02 DIAGNOSIS — F332 Major depressive disorder, recurrent severe without psychotic features: Secondary | ICD-10-CM | POA: Diagnosis not present

## 2024-08-29 DIAGNOSIS — F50811 Binge eating disorder, moderate: Secondary | ICD-10-CM | POA: Diagnosis not present

## 2024-08-29 DIAGNOSIS — E663 Overweight: Secondary | ICD-10-CM | POA: Diagnosis not present

## 2024-08-29 DIAGNOSIS — R7303 Prediabetes: Secondary | ICD-10-CM | POA: Diagnosis not present

## 2024-08-29 DIAGNOSIS — Z6829 Body mass index (BMI) 29.0-29.9, adult: Secondary | ICD-10-CM | POA: Diagnosis not present

## 2024-08-29 DIAGNOSIS — R632 Polyphagia: Secondary | ICD-10-CM | POA: Diagnosis not present

## 2024-08-30 DIAGNOSIS — F3341 Major depressive disorder, recurrent, in partial remission: Secondary | ICD-10-CM | POA: Diagnosis not present

## 2024-08-30 NOTE — Progress Notes (Unsigned)
 [name] [address] [place] [address] Phone: [phone] Subjective:   LILLETTE Berwyn Posey, am serving as a scribe for Dr. Arthea Claudene.  I'm seeing this patient by the request  of:  Katrinka Garnette KIDD, MD  CC: Left hip pain  YEP:Dlagzrupcz  06/14/2024 At this point patient is not getting any significant improvement with the injections.  We discussed with patient that icing regimen and home exercises, discussed with patient about the possibility of radiation from the lumbar spine and we will try an L4-L5 epidural that I am hopeful will make some difference.  Patient is doing wonderful with loss of weight and increasing activity as well as did wonderful with physical therapy.  We discussed that iron deficiency could also be be potentially contributing.  Recheck labs to see if patient is making any significant difference or will consider the possibility of transfusion.  Follow-up with me again 3 months otherwise     Update 09/04/2024 Dori Devino Fronczak is a 63 y.o. female coming in with complaint of L hip pain.  Found to have more related radicular symptoms.  Was sent to have an epidural.  Epidural done on October 7.  Patient states that PT was helpful but she is not doing the exercises as much at home. Epidural was somewhat helpful. Continues to have pain that runs down lateral aspect of upper L leg. Able to sleep and walk for longer periods of time. Injections to GT are not helpful.        Past Medical History:  Diagnosis Date   Allergy    Anxiety    Arthritis    Chronic fatigue syndrome    Depression    Diverticulosis    External hemorrhoids    GERD (gastroesophageal reflux disease)    Hypertension    Internal hemorrhoids    Neuropathy    Spinal stenosis    Past Surgical History:  Procedure Laterality Date   APPENDECTOMY     CARDIAC CATHETERIZATION  09/18/2012   LAPAROSCOPIC APPENDECTOMY N/A 01/22/2023   Procedure: APPENDECTOMY  LAPAROSCOPIC;  Surgeon: Teresa Lonni HERO, MD;  Location: [facility] OR;  Service: General;  Laterality: N/A;   LEFT HEART CATHETERIZATION WITH CORONARY ANGIOGRAM N/A 09/18/2012   Procedure: LEFT HEART CATHETERIZATION WITH CORONARY ANGIOGRAM;  Surgeon: Ozell Fell, MD;  Location: [facility] CATH LAB;  Service: Cardiovascular;  Laterality: N/A;   TONSILLECTOMY  09/21/1963   Social History   Socioeconomic History   Marital status: Married    Spouse name: Not on file   Number of children: 2   Years of education: Not on file   Highest education level: Master's degree (e.g., MA, MS, MEng, MEd, MSW, MBA)  Occupational History   Occupation: Conservation Officer, Historic Buildings: [employer]  Tobacco Use   Smoking status: Never   Smokeless tobacco: Never  Substance and Sexual Activity   Alcohol use: No   Drug use: No   Sexual activity: Not Currently    Birth control/protection: Post-menopausal  Other Topics Concern   Not on file  Social History Narrative   Married. 2 adopted children- adopted daughter (82) when single after first marriage from [place]. Son from [place] with 2nd husband(18) in 2021. 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[name]      Hobbies: animal rescue- has litter of kittens in 2021, walking, time outside   Social Drivers of Health   Tobacco Use: Low Risk (09/03/2024)  Patient History    Smoking Tobacco Use: Never    Smokeless Tobacco Use: Never    Passive Exposure: Not on file  Financial Resource Strain: Low Risk (12/26/2023)   Overall Financial Resource Strain (CARDIA)    Difficulty of Paying Living Expenses: Not hard at all  Food Insecurity: No Food Insecurity (12/26/2023)   Hunger Vital Sign    Worried About Running Out of Food in the Last Year: Never true    Ran Out of Food in the Last Year: Never true  Transportation Needs: No Transportation Needs (12/26/2023)   PRAPARE - Administrator, Civil Service (Medical): No    Lack of Transportation  (Non-Medical): No  Physical Activity: Sufficiently Active (12/26/2023)   Exercise Vital Sign    Days of Exercise per Week: 3 days    Minutes of Exercise per Session: 70 min  Stress: No Stress Concern Present (12/26/2023)   [employer] of Occupational Health - Occupational Stress Questionnaire    Feeling of Stress : Only a little  Social Connections: Moderately Integrated (12/26/2023)   Social Connection and Isolation Panel    Frequency of Communication with Friends and Family: Three times a week    Frequency of Social Gatherings with Friends and Family: Once a week    Attends Religious Services: 1 to 4 times per year    Active Member of [employer] or Organizations: No    Attends Engineer, Structural: Not on file    Marital Status: Married  Depression (PHQ2-9): Low Risk (09/03/2024)   Depression (PHQ2-9)    PHQ-2 Score: 0  Alcohol Screen: Not on file  Housing: Low Risk (12/26/2023)   Housing Stability Vital Sign    Unable to Pay for Housing in the Last Year: No    Number of Times Moved in the Last Year: 0    Homeless in the Last Year: No  Utilities: Not on file  Health Literacy: Not on file   Allergies[1] Family History  Problem Relation Age of Onset   Heart attack Maternal Grandmother 75   Arthritis Maternal Grandmother    Hyperlipidemia Maternal Grandmother    Heart disease Maternal Grandmother    Diabetes Maternal Grandmother    Pancreatic cancer Mother        died 61. 2.5 years past diagnosis.     Anxiety disorder Mother    Depression Mother    Colon polyps Father    Healthy Sister    Healthy Brother    Depression Brother    Heart attack Brother        age 61   Suicidality Maternal Grandfather    Colon cancer Neg Hx    Esophageal cancer Neg Hx    Kidney disease Neg Hx     Current Outpatient Medications (Endocrine & Metabolic):    Semaglutide  (RYBELSUS ) 3 MG TABS, 1 tablet Orally once a day; Duration: 30 days  Current Outpatient Medications (Cardiovascular):     hydrochlorothiazide  (HYDRODIURIL ) 25 MG tablet, Take 1 tablet (25 mg total) by mouth daily.  Current Outpatient Medications (Analgesics):    Acetaminophen -Caff-Pyrilamine 500-60-15 MG TABS, Take by mouth.   ACETAMINOPHEN -CAFFEINE PO, Take by mouth in the morning.   Suzetrigine  (JOURNAVX ) 50 MG TABS, Take 50 mg by mouth in the morning and at bedtime.  Current Outpatient Medications (Other):    ARIPiprazole  (ABILIFY ) 2 MG tablet, Take 0.5-1 tablets (1-2 mg total) by mouth daily.   clonazePAM  (KLONOPIN ) 0.5 MG tablet, Take 1 tablet (0.5 mg total) by  mouth daily as needed.   famotidine  (PEPCID ) 20 MG tablet, Take 1 tablet (20 mg total) by mouth 2 (two) times daily.   FLUoxetine  (PROZAC ) 10 MG capsule, Take 3 capsules (30 mg total) by mouth daily.   lisdexamfetamine  (VYVANSE ) 10 MG capsule, Take 1-2 capsules (10-20 mg total) by mouth daily in the afternoon.   lisdexamfetamine  (VYVANSE ) 20 MG capsule, Take 1 capsule (20 mg total) by mouth in the morning.   lisdexamfetamine  (VYVANSE ) 20 MG capsule, Take 1 capsule (20 mg total) by mouth daily.   MAGNESIUM CHLORIDE-CALCIUM  PO, Take by mouth.   Melatonin 3 MG CAPS, Take 6 mg by mouth at bedtime.   pantoprazole  (PROTONIX ) 20 MG tablet, Take 1 tablet (20 mg total) by mouth daily.   Probiotic Product (PROBIOTIC DAILY PO), Take by mouth.   Reviewed prior external information including notes and imaging from  primary care provider As well as notes that were available from care everywhere and other healthcare systems.  Past medical history, social, surgical and family history all reviewed in electronic medical record.  No pertanent information unless stated regarding to the chief complaint.   Review of Systems:  No headache, visual changes, nausea, vomiting, diarrhea, constipation, dizziness, abdominal pain, skin rash, fevers, chills, night sweats, weight loss, swollen lymph nodes, body aches, joint swelling, chest pain, shortness of breath, mood  changes. POSITIVE muscle aches  Objective  Blood pressure 116/72, pulse 100, height 5' 1 (1.549 m), weight 166 lb (75.3 kg), last menstrual period 11/05/2007, SpO2 96%.   General: No apparent distress alert and oriented x3 mood and affect normal, dressed appropriately.  HEENT: Pupils equal, extraocular movements intact  Respiratory: Patient's speak in full sentences and does not appear short of breath  Cardiovascular: No lower extremity edema, non tender, no erythema  Left hip exam shows patient is favoring the left side.  Patient though is able to go from seated to standing without any significant difficulty.  Negative straight leg test noted.  No significant weakness noted on gait  Back exam shows very mild loss of lordosis    Impression and Recommendations:    The above documentation has been reviewed and is accurate and complete Arthea CHRISTELLA Sharps, DO        [1]  Allergies Allergen Reactions   Egg Protein-Containing Drug Products Other (See Comments)    Makes pt feel like burping up rotten eggs

## 2024-09-01 ENCOUNTER — Other Ambulatory Visit: Payer: Self-pay | Admitting: Behavioral Health

## 2024-09-01 DIAGNOSIS — F3342 Major depressive disorder, recurrent, in full remission: Secondary | ICD-10-CM

## 2024-09-01 DIAGNOSIS — F419 Anxiety disorder, unspecified: Secondary | ICD-10-CM

## 2024-09-01 DIAGNOSIS — F988 Other specified behavioral and emotional disorders with onset usually occurring in childhood and adolescence: Secondary | ICD-10-CM

## 2024-09-03 ENCOUNTER — Other Ambulatory Visit (HOSPITAL_COMMUNITY): Payer: Self-pay

## 2024-09-03 ENCOUNTER — Ambulatory Visit: Payer: Self-pay | Admitting: Family Medicine

## 2024-09-03 ENCOUNTER — Ambulatory Visit: Admitting: Family Medicine

## 2024-09-03 ENCOUNTER — Other Ambulatory Visit: Payer: Self-pay

## 2024-09-03 ENCOUNTER — Encounter: Payer: Self-pay | Admitting: Family Medicine

## 2024-09-03 VITALS — BP 128/78 | HR 81 | Temp 98.2°F | Ht 61.0 in | Wt 165.2 lb

## 2024-09-03 DIAGNOSIS — Z23 Encounter for immunization: Secondary | ICD-10-CM

## 2024-09-03 DIAGNOSIS — R7303 Prediabetes: Secondary | ICD-10-CM | POA: Diagnosis not present

## 2024-09-03 DIAGNOSIS — E785 Hyperlipidemia, unspecified: Secondary | ICD-10-CM | POA: Diagnosis not present

## 2024-09-03 DIAGNOSIS — Z131 Encounter for screening for diabetes mellitus: Secondary | ICD-10-CM | POA: Diagnosis not present

## 2024-09-03 DIAGNOSIS — D509 Iron deficiency anemia, unspecified: Secondary | ICD-10-CM

## 2024-09-03 DIAGNOSIS — I10 Essential (primary) hypertension: Secondary | ICD-10-CM | POA: Diagnosis not present

## 2024-09-03 LAB — IBC + FERRITIN
Ferritin: 4.3 ng/mL — ABNORMAL LOW (ref 10.0–291.0)
Iron: 38 ug/dL — ABNORMAL LOW (ref 42–145)
Saturation Ratios: 7.6 % — ABNORMAL LOW (ref 20.0–50.0)
TIBC: 502.6 ug/dL — ABNORMAL HIGH (ref 250.0–450.0)
Transferrin: 359 mg/dL (ref 212.0–360.0)

## 2024-09-03 LAB — CBC WITH DIFFERENTIAL/PLATELET
Basophils Absolute: 0 K/uL (ref 0.0–0.1)
Basophils Relative: 1 % (ref 0.0–3.0)
Eosinophils Absolute: 0.2 K/uL (ref 0.0–0.7)
Eosinophils Relative: 4.9 % (ref 0.0–5.0)
HCT: 33.9 % — ABNORMAL LOW (ref 36.0–46.0)
Hemoglobin: 11.5 g/dL — ABNORMAL LOW (ref 12.0–15.0)
Lymphocytes Relative: 21.9 % (ref 12.0–46.0)
Lymphs Abs: 0.9 K/uL (ref 0.7–4.0)
MCHC: 33.9 g/dL (ref 30.0–36.0)
MCV: 77.5 fl — ABNORMAL LOW (ref 78.0–100.0)
Monocytes Absolute: 0.3 K/uL (ref 0.1–1.0)
Monocytes Relative: 8.4 % (ref 3.0–12.0)
Neutro Abs: 2.5 K/uL (ref 1.4–7.7)
Neutrophils Relative %: 63.8 % (ref 43.0–77.0)
Platelets: 289 K/uL (ref 150.0–400.0)
RBC: 4.38 Mil/uL (ref 3.87–5.11)
RDW: 14.1 % (ref 11.5–15.5)
WBC: 3.9 K/uL — ABNORMAL LOW (ref 4.0–10.5)

## 2024-09-03 LAB — COMPREHENSIVE METABOLIC PANEL WITH GFR
ALT: 13 U/L (ref 0–35)
AST: 16 U/L (ref 0–37)
Albumin: 4.1 g/dL (ref 3.5–5.2)
Alkaline Phosphatase: 65 U/L (ref 39–117)
BUN: 12 mg/dL (ref 6–23)
CO2: 28 meq/L (ref 19–32)
Calcium: 9.5 mg/dL (ref 8.4–10.5)
Chloride: 102 meq/L (ref 96–112)
Creatinine, Ser: 0.63 mg/dL (ref 0.40–1.20)
GFR: 93.09 mL/min (ref >60.00)
Glucose, Bld: 101 mg/dL — ABNORMAL HIGH (ref 70–99)
Potassium: 3.9 meq/L (ref 3.5–5.1)
Sodium: 138 meq/L (ref 135–145)
Total Bilirubin: 0.2 mg/dL (ref 0.2–1.2)
Total Protein: 6.6 g/dL (ref 6.0–8.3)

## 2024-09-03 LAB — HEMOGLOBIN A1C: Hgb A1c MFr Bld: 6 % (ref 4.6–6.5)

## 2024-09-03 MED ORDER — FAMOTIDINE 20 MG PO TABS
20.0000 mg | ORAL_TABLET | Freq: Two times a day (BID) | ORAL | 11 refills | Status: AC
  Filled 2024-09-03: qty 60, 30d supply, fill #0
  Filled 2024-09-28: qty 60, 30d supply, fill #1

## 2024-09-03 NOTE — Progress Notes (Signed)
 Phone [phone] In person visit   Subjective:   [name] is a 63 y.o. year old very pleasant female patient who presents for/with See problem oriented charting Chief Complaint  Patient presents with   Medical Management of Chronic Issues    6 month follow up; would like iron checked; requesting pap results from [facility];     Past Medical History-  Patient Active Problem List   Diagnosis Date Noted   Prediabetes 12/28/2023    Priority: Medium    Anxiety 07/24/2020    Priority: Medium    Attention deficit disorder (ADD) in adult 03/31/2020    Priority: Medium    Low bone density 03/17/2020    Priority: Medium    Major depressive disorder in full remission 02/15/2020    Priority: Medium    GERD (gastroesophageal reflux disease) 02/15/2020    Priority: Medium    Essential hypertension 02/18/2014    Priority: Medium    Hyperlipidemia 09/18/2012    Priority: Medium    Allergic rhinitis 03/09/2016    Priority: Low   Chronic pain syndrome 02/22/2013    Priority: Low   Chronic insomnia 02/22/2013    Priority: Low   Paresthesias 10/25/2012    Priority: Low   S/P laparoscopic appendectomy 01/22/2023    Priority: 1.   Unilateral primary osteoarthritis, left hip 06/25/2022    Priority: 1.   Hip flexor tendon tightness, left 04/02/2022    Priority: 1.   Greater trochanteric bursitis of left hip 01/11/2022    Priority: 1.   Cervical stenosis of spine 03/31/2020    Priority: 1.   Nonallopathic lesion of cervical region 04/03/2020   Nonallopathic lesion of thoracic region 04/03/2020   Nonallopathic lesion of lumbar region 04/03/2020    Medications- reviewed and updated Current Outpatient Medications  Medication Sig Dispense Refill   Acetaminophen -Caff-Pyrilamine 500-60-15 MG TABS Take by mouth.     ACETAMINOPHEN -CAFFEINE PO Take by mouth in the morning.     ARIPiprazole  (ABILIFY ) 2 MG tablet Take 0.5-1 tablets (1-2 mg total) by mouth daily. 90 tablet 1    clonazePAM  (KLONOPIN ) 0.5 MG tablet Take 1 tablet (0.5 mg total) by mouth daily as needed. 30 tablet 0   FLUoxetine  (PROZAC ) 10 MG capsule Take 3 capsules (30 mg total) by mouth daily. 270 capsule 1   hydrochlorothiazide  (HYDRODIURIL ) 25 MG tablet Take 1 tablet (25 mg total) by mouth daily. 90 tablet 3   lisdexamfetamine  (VYVANSE ) 10 MG capsule Take 1-2 capsules (10-20 mg total) by mouth daily in the afternoon. 180 capsule 0   lisdexamfetamine  (VYVANSE ) 20 MG capsule Take 1 capsule (20 mg total) by mouth in the morning. 90 capsule 0   lisdexamfetamine  (VYVANSE ) 20 MG capsule Take 1 capsule (20 mg total) by mouth daily. 90 capsule 0   MAGNESIUM CHLORIDE-CALCIUM  PO Take by mouth.     Melatonin 3 MG CAPS Take 6 mg by mouth at bedtime.     pantoprazole  (PROTONIX ) 20 MG tablet Take 1 tablet (20 mg total) by mouth daily. 90 tablet 3   Probiotic Product (PROBIOTIC DAILY PO) Take by mouth.     Semaglutide  (RYBELSUS ) 3 MG TABS 1 tablet Orally once a day; Duration: 30 days     No current facility-administered medications for this visit.     Objective:  BP 128/78 (BP Location: Left Arm, Patient Position: Sitting, Cuff Size: Normal)   Pulse 81   Temp 98.2 F (36.8 C) (Temporal)   Ht 5' 1 (1.549 m)  Wt 165 lb 3.2 oz (74.9 kg)   LMP 11/05/2007   SpO2 97%   BMI 31.21 kg/m  Gen: NAD, resting comfortably CV: RRR no murmurs rubs or gallops Lungs: CTAB no crackles, wheeze, rhonchi Ext: no edema Skin: warm, dry     Assessment and Plan   #Rybelsus  - [facility] is setting this up. They have had some samples.   #hypertension S: medication: Hydrochlorothiazide  25 mg BP Readings from Last 3 Encounters:  09/03/24 128/78  06/26/24 119/76  06/14/24 114/80  A/P: well controlled continue current medications - update CMP    #hyperlipidemia-CT cardiac scoring 04/23/22 score of 0 S: Medication:None -Small vessel disease was noted on MRI of the brain in the past but patient has wanted to work on  lifestyle changes Lab Results  Component Value Date   CHOL 220 (H) 02/28/2024   HDL 64.20 02/28/2024   LDLCALC 138 (H) 02/28/2024   TRIG 86.0 02/28/2024   CHOLHDL 3 02/28/2024  A/P:  lipids above goal but with reassuring CT calcium  score we've opted to hold off on starting statin- she wants to work on lifestyle   % # Depression/anxiety/ADD-now managed by psychiatry Redell Pizza, [facility] S: Medication:Fluoxetine  10 mg - 3 tablets daily for depression and anxiety, Abilify  1-2 mg for depression, Vyvanse  20 mg for ADD -Also on clonazepam  as needed for anxiety -Also on melatonin for sleep    09/03/2024    8:13 AM 12/28/2023    8:15 AM 02/22/2023    7:59 AM  Depression screen PHQ 2/9  Decreased Interest 0 0 0  Down, Depressed, Hopeless 0 0 1  PHQ - 2 Score 0 0 1  Altered sleeping 0 0 0  Tired, decreased energy 0 0 0  Change in appetite 0 0 1  Feeling bad or failure about yourself  0 0 1  Trouble concentrating 0 0 0  Moving slowly or fidgety/restless 0 0 0  Suicidal thoughts 0 0 0  PHQ-9 Score 0 0  3   Difficult doing work/chores Not difficult at all Not difficult at all Not difficult at all     Data saved with a previous flowsheet row definition  A/P:   doing well- continues close follow up with psychiatry with full remission of depression and anxiety/attention deficit disorder reasonably controlled  # GERD S:Medication: Protonix  20 mg. No breakthrough symptom(s).  A/P:  not ideal for low bone density- discussed possible Pepcid   she's willing to trial 20 mg twice daily for 1 month then can go to 10 mg twice daily if tolerable OTC (available over the counter without a prescription)   # Hyperglycemia/insulin resistance/prediabetes- peak a1c of 6.4 S:  Medication: none Exercise and diet- down 8 lbs since june  Lab Results  Component Value Date   HGBA1C 6.4 12/28/2023   HGBA1C 5.9 02/22/2023   HGBA1C 5.8 08/22/2014  A/P: hopefully stable- update a1c today. Continue without  meds for now   # iron deficiency anemia- ferrous sulfate every other day typically -transfusion of iron in 2025 ordered but didn't receive (she never got called) update levels today. Was inconsistent at that time as well. Has been worse as far as consistency lately- some constipation may have pushed her away -shed prefer oral iron and she's going to try that first and we will consider 3 month recheck  Recommended follow up: Return in about 6 months (around 03/04/2025) for physical or sooner if needed.Schedule b4 you leave. Future Appointments  Date Time Provider Department Center  09/04/2024  7:45 AM Claudene Arthea HERO, DO [facility] None  09/06/2024  8:00 AM Teresa Redell LABOR, NP [facility] None  03/04/2025  8:00 AM Katrinka Garnette KIDD, MD [facility]    Lab/Order associations:   ICD-10-CM   1. Essential hypertension  I10     2. Hyperlipidemia, unspecified hyperlipidemia type  E78.5     3. Prediabetes  R73.03     4. Screening for diabetes mellitus  Z13.1     5. Iron deficiency anemia, unspecified iron deficiency anemia type  D50.9       No orders of the defined types were placed in this encounter.   Return precautions advised.  Garnette Katrinka, MD

## 2024-09-03 NOTE — Patient Instructions (Addendum)
 Health Maintenance Due  Topic Date Due   DTaP/Tdap/Td (2 - Td or Tdap) 08/22/2024  Tetanus, Diphtheria, and Pertussis (Tdap) today- good for 10 years  Consider slow FE iron version. Depending on levels could consider iron transfusion -shed prefer oral iron and she's going to try that first and we will consider 3 month recheck  Trial Pepcid  20 mg twice daily for 1 month-  then can go to 10 mg twice daily if tolerable OTC (available over the counter without a prescription)   Please stop by lab before you go If you have mychart- we will send your results within 3 business days of us  receiving them.  If you do not have mychart- we will call you about results within 5 business days of us  receiving them.  *please also note that you will see labs on mychart as soon as they post. I will later go in and write notes on them- will say notes from Dr. Katrinka   Recommended follow up: Return in about 6 months (around 03/04/2025) for physical or sooner if needed.Schedule b4 you leave.

## 2024-09-03 NOTE — Addendum Note (Signed)
 Addended by: Stormee Duda on: 09/03/2024 08:48 AM   Modules accepted: Orders

## 2024-09-04 ENCOUNTER — Ambulatory Visit: Admitting: Family Medicine

## 2024-09-04 ENCOUNTER — Other Ambulatory Visit: Payer: Self-pay

## 2024-09-04 ENCOUNTER — Other Ambulatory Visit: Payer: Self-pay | Admitting: Family Medicine

## 2024-09-04 ENCOUNTER — Other Ambulatory Visit (HOSPITAL_COMMUNITY): Payer: Self-pay

## 2024-09-04 VITALS — BP 116/72 | HR 100 | Ht 61.0 in | Wt 166.0 lb

## 2024-09-04 DIAGNOSIS — M5416 Radiculopathy, lumbar region: Secondary | ICD-10-CM | POA: Insufficient documentation

## 2024-09-04 DIAGNOSIS — D509 Iron deficiency anemia, unspecified: Secondary | ICD-10-CM

## 2024-09-04 MED ORDER — JOURNAVX 50 MG PO TABS
1.0000 | ORAL_TABLET | Freq: Two times a day (BID) | ORAL | 0 refills | Status: AC
  Filled 2024-09-04: qty 60, 30d supply, fill #0

## 2024-09-04 NOTE — Assessment & Plan Note (Signed)
 Still having lumbar radiculopathy.  Discussed multiple different treatment options.  Epidurals have helped with the axial skeletal pain but not to the overall discomfort noted with the radicular symptoms down the leg.  Discussed with patient about icing regimen, we discussed continuing to stay active.  Will give patient a trial of June of asked to help us  delineate how much of this is more peripheral.  Can consider the possibility of piriformis injections and follow-up.  Other ideas would be a nerve conduction study depending on how patient responds.  Follow-up with me again 2 months  I personally spent a total of 31 minutes in the care of the patient today including preparing to see the patient, getting/reviewing separately obtained history, counseling and educating, placing orders, documenting clinical information in the EHR, communicating results, and coordinating care.

## 2024-09-04 NOTE — Patient Instructions (Signed)
 Journavax 50mg  BID See me again in 7 weeks  Update us  in 2 weeks

## 2024-09-06 ENCOUNTER — Other Ambulatory Visit (HOSPITAL_COMMUNITY): Payer: Self-pay

## 2024-09-06 ENCOUNTER — Ambulatory Visit: Admitting: Behavioral Health

## 2024-09-06 ENCOUNTER — Other Ambulatory Visit: Payer: Self-pay

## 2024-09-06 ENCOUNTER — Other Ambulatory Visit: Payer: Self-pay | Admitting: Behavioral Health

## 2024-09-06 DIAGNOSIS — F419 Anxiety disorder, unspecified: Secondary | ICD-10-CM | POA: Diagnosis not present

## 2024-09-06 DIAGNOSIS — F3342 Major depressive disorder, recurrent, in full remission: Secondary | ICD-10-CM | POA: Diagnosis not present

## 2024-09-06 DIAGNOSIS — F988 Other specified behavioral and emotional disorders with onset usually occurring in childhood and adolescence: Secondary | ICD-10-CM

## 2024-09-06 MED ORDER — ARIPIPRAZOLE 2 MG PO TABS
1.0000 mg | ORAL_TABLET | Freq: Every day | ORAL | 1 refills | Status: AC
  Filled 2024-09-06 – 2024-09-22 (×2): qty 90, 90d supply, fill #0

## 2024-09-06 MED ORDER — FLUOXETINE HCL 10 MG PO CAPS
30.0000 mg | ORAL_CAPSULE | Freq: Every day | ORAL | 1 refills | Status: AC
  Filled 2024-09-06: qty 270, 90d supply, fill #0

## 2024-09-06 MED ORDER — LISDEXAMFETAMINE DIMESYLATE 20 MG PO CAPS
20.0000 mg | ORAL_CAPSULE | Freq: Every day | ORAL | 0 refills | Status: AC
  Filled 2024-09-06 – 2024-09-22 (×2): qty 90, 90d supply, fill #0

## 2024-09-06 NOTE — Progress Notes (Signed)
 [facility]  Patient ID: [name],  MRN: [id number]  PCP: Katrinka Garnette KIDD, MD  Date of Evaluation: 09/06/2024 Time spent:30 minutes  Chief Complaint:  Chief Complaint   Depression; Anxiety; Follow-up; Medication Refill; Patient Education     HISTORY/CURRENT STATUS: HPI MARWAH DISBRO presents to the office today for  Collateral information should be considered reliable.  Reporting good stability again this visit.  Still debating on whether she would like to stop her Abilify .  She will notify the office if she decides to do so.     No other changes to her medication regimen at this time. Denies history of mania, no psychosis, no auditory or visual hallucinations.  Denies SI.  Requesting 41-month follow-up.     Past Psychiatric Medication Trials: Cymbalta  Zoloft- Interfered with concentration and thinking was not clear Lexapro- Nocturnal panic attacks.   Prozac - Was on 40 mg of Prozac  at the time she was having akathisia with Abilify  2 mg. Higher doses seemed to exacerbate akathisia Abilify - helpful. Started on 2 mg daily. Had akathisia and twitching at 5 mg Rexulti- Had very low motivation and fatigue Lamictal- rash Klonopin - Takes as needed. Did not help with rumination. Ambien  Vyvanse - took 20 mg in the past for ADHD. Re-started 10 mg dose. Remeron- Gained 20 lbs. Gabapentin - Given for neuropathy and caused cloudy thoughts.  Lamotrigine- tingling/burning around mouth and vagina. No rash or blisters.  May have been helpful for mood. Topamax    Had [facility] testing that indicated adverse effects to Zoloft, Lexapro, and Paxil.        Individual Medical History/ Review of Systems: Changes? :No   Allergies: Egg protein-containing drug products  Current Medications: Current Medications[1] Medication Side Effects: none  Family Medical/ Social History: Changes? No  MENTAL HEALTH EXAM:  Last menstrual period 11/05/2007.There is no height or weight  on file to calculate BMI.  General Appearance: Casual, Neat, and Well Groomed  Eye Contact:  Good  Speech:  Clear and Coherent  Volume:  Normal  Mood:  NA  Affect:  Appropriate  Thought Process:  Coherent  Orientation:  Full (Time, Place, and Person)  Thought Content: Logical   Suicidal Thoughts:  No  Homicidal Thoughts:  No  Memory:  WNL  Judgement:  Good  Insight:  Good  Psychomotor Activity:  Normal  Concentration:  Concentration: Good  Recall:  Good  Fund of Knowledge: Good  Language: Good  Assets:  Desire for Improvement  ADL's:  Intact  Cognition: WNL  Prognosis:  Good    DIAGNOSES:    ICD-10-CM   1. Recurrent major depressive disorder, in full remission  F33.42 FLUoxetine  (PROZAC ) 10 MG capsule    2. Anxiety  F41.9 FLUoxetine  (PROZAC ) 10 MG capsule    3. Attention deficit disorder (ADD) in adult  F98.8 lisdexamfetamine  (VYVANSE ) 20 MG capsule      Receiving Psychotherapy: yes   RECOMMENDATIONS:  Greater than 50% of  30 min face to face time with patient was spent on counseling and coordination of care. She was able to wean to 0.5 mg Abilify .  We further discussed her contemplation about stopping Abilify  since she is now on a very low dose.  She would like to have some of the medication on hand in case it does not work out.     We agreed to:   Will continue current plan of care since target signs and symptoms are well controlled without any tolerability issues. Continue Abilify  0.5 mg daily for  augmentation of depression if necessary.  Continue Prozac  30 mg daily for anxiety and depression.  Continue Vyvanse  20 mg daily for ADHD.  Continue klonopin  0.5 mg daily as needed for anxiety.  Provided emergency contact information Pt to follow-up in 3  months or sooner if clinically indicated.  Patient advised to contact office with any questions, adverse effects, or acute worsening in signs and symptoms. Reviewed PDMP     Redell DELENA Pizza, NP     [1]  Current  Outpatient Medications:    Acetaminophen -Caff-Pyrilamine 500-60-15 MG TABS, Take by mouth., Disp: , Rfl:    ACETAMINOPHEN -CAFFEINE PO, Take by mouth in the morning., Disp: , Rfl:    ARIPiprazole  (ABILIFY ) 2 MG tablet, Take 0.5-1 tablets (1-2 mg total) by mouth daily., Disp: 90 tablet, Rfl: 1   clonazePAM  (KLONOPIN ) 0.5 MG tablet, Take 1 tablet (0.5 mg total) by mouth daily as needed., Disp: 30 tablet, Rfl: 0   famotidine  (PEPCID ) 20 MG tablet, Take 1 tablet (20 mg total) by mouth 2 (two) times daily., Disp: 60 tablet, Rfl: 11   FLUoxetine  (PROZAC ) 10 MG capsule, Take 3 capsules (30 mg total) by mouth daily., Disp: 270 capsule, Rfl: 1   hydrochlorothiazide  (HYDRODIURIL ) 25 MG tablet, Take 1 tablet (25 mg total) by mouth daily., Disp: 90 tablet, Rfl: 3   lisdexamfetamine  (VYVANSE ) 10 MG capsule, Take 1-2 capsules (10-20 mg total) by mouth daily in the afternoon., Disp: 180 capsule, Rfl: 0   lisdexamfetamine  (VYVANSE ) 20 MG capsule, Take 1 capsule (20 mg total) by mouth in the morning., Disp: 90 capsule, Rfl: 0   lisdexamfetamine  (VYVANSE ) 20 MG capsule, Take 1 capsule (20 mg total) by mouth daily., Disp: 90 capsule, Rfl: 0   MAGNESIUM CHLORIDE-CALCIUM  PO, Take by mouth., Disp: , Rfl:    Melatonin 3 MG CAPS, Take 6 mg by mouth at bedtime., Disp: , Rfl:    pantoprazole  (PROTONIX ) 20 MG tablet, Take 1 tablet (20 mg total) by mouth daily., Disp: 90 tablet, Rfl: 3   Probiotic Product (PROBIOTIC DAILY PO), Take by mouth., Disp: , Rfl:    Semaglutide  (RYBELSUS ) 3 MG TABS, 1 tablet Orally once a day; Duration: 30 days, Disp: , Rfl:    Suzetrigine  (JOURNAVX ) 50 MG TABS, Take 1 tablet by mouth in the morning and at bedtime., Disp: 60 tablet, Rfl: 0

## 2024-09-07 ENCOUNTER — Other Ambulatory Visit (HOSPITAL_COMMUNITY): Payer: Self-pay

## 2024-09-07 ENCOUNTER — Emergency Department (HOSPITAL_BASED_OUTPATIENT_CLINIC_OR_DEPARTMENT_OTHER): Admitting: Radiology

## 2024-09-07 ENCOUNTER — Encounter (HOSPITAL_BASED_OUTPATIENT_CLINIC_OR_DEPARTMENT_OTHER): Payer: Self-pay

## 2024-09-07 ENCOUNTER — Other Ambulatory Visit: Payer: Self-pay

## 2024-09-07 ENCOUNTER — Emergency Department (HOSPITAL_BASED_OUTPATIENT_CLINIC_OR_DEPARTMENT_OTHER)
Admission: EM | Admit: 2024-09-07 | Discharge: 2024-09-07 | Disposition: A | Discharge status: Home / Self Care | Attending: Emergency Medicine | Admitting: Emergency Medicine

## 2024-09-07 DIAGNOSIS — Z79899 Other long term (current) drug therapy: Secondary | ICD-10-CM | POA: Insufficient documentation

## 2024-09-07 DIAGNOSIS — I10 Essential (primary) hypertension: Secondary | ICD-10-CM | POA: Insufficient documentation

## 2024-09-07 DIAGNOSIS — R0602 Shortness of breath: Secondary | ICD-10-CM | POA: Insufficient documentation

## 2024-09-07 DIAGNOSIS — R0789 Other chest pain: Secondary | ICD-10-CM | POA: Insufficient documentation

## 2024-09-07 DIAGNOSIS — R079 Chest pain, unspecified: Secondary | ICD-10-CM | POA: Diagnosis not present

## 2024-09-07 LAB — CBC
HCT: 30.8 % — ABNORMAL LOW (ref 36.0–46.0)
Hemoglobin: 10.4 g/dL — ABNORMAL LOW (ref 12.0–15.0)
MCH: 26.3 pg (ref 26.0–34.0)
MCHC: 33.8 g/dL (ref 30.0–36.0)
MCV: 78 fL — ABNORMAL LOW (ref 80.0–100.0)
Platelets: 233 K/uL (ref 150–400)
RBC: 3.95 MIL/uL (ref 3.87–5.11)
RDW: 13.4 % (ref 11.5–15.5)
WBC: 3.8 K/uL — ABNORMAL LOW (ref 4.0–10.5)
nRBC: 0 % (ref 0.0–0.2)

## 2024-09-07 LAB — BASIC METABOLIC PANEL WITH GFR
Anion gap: 11 (ref 5–15)
BUN: 16 mg/dL (ref 8–23)
CO2: 27 mmol/L (ref 22–32)
Calcium: 9.5 mg/dL (ref 8.9–10.3)
Chloride: 102 mmol/L (ref 98–111)
Creatinine, Ser: 0.63 mg/dL (ref 0.44–1.00)
GFR, Estimated: >60 mL/min
Glucose, Bld: 115 mg/dL — ABNORMAL HIGH (ref 70–99)
Potassium: 3.5 mmol/L (ref 3.5–5.1)
Sodium: 140 mmol/L (ref 135–145)

## 2024-09-07 LAB — D-DIMER, QUANTITATIVE: D-Dimer, Quant: 0.49 ug{FEU}/mL (ref 0.00–0.50)

## 2024-09-07 LAB — TROPONIN T, HIGH SENSITIVITY
Troponin T High Sensitivity: <15 ng/L (ref 0–19)
Troponin T High Sensitivity: <15 ng/L (ref 0–19)

## 2024-09-07 MED ORDER — KETOROLAC TROMETHAMINE 15 MG/ML IJ SOLN
15.0000 mg | Freq: Once | INTRAMUSCULAR | Status: AC
  Administered 2024-09-07: 15 mg via INTRAVENOUS
  Filled 2024-09-07: qty 1

## 2024-09-07 MED ORDER — METHOCARBAMOL 500 MG PO TABS
500.0000 mg | ORAL_TABLET | Freq: Three times a day (TID) | ORAL | 0 refills | Status: AC | PRN
  Filled 2024-09-07 (×2): qty 8, 3d supply, fill #0

## 2024-09-07 NOTE — ED Triage Notes (Signed)
 Pt reports pain under L breast for the past couple of days. Patient was swimming laps in the gym upstairs and had to stop d/t SOB and increasing pain. States she is SOB at rest. Denies any strenuous activity, recent illnesses.

## 2024-09-07 NOTE — ED Provider Notes (Signed)
 " [place] [facility]GENCY DEPARTMENT AT [facility] Provider Note   CSN: [id number] Arrival date & time: 09/07/24  9370     Patient presents with: No chief complaint on file.   Tammy [name] is a 63 y.o. female.   HPI Patient presents with chest pain and shortness of breath.  Under her left breast.  Began the last couple days but worse today.  States she was swimming and normally would swim 105 laps and after 20 had to stop due to shortness of breath and chest pain.  Pain is on the left chest by her breast.  No rash.  Feels as if she hurt it but says there was no injury.  No swelling of her legs.  No cough.  No known sick contacts.  Had heart cath probably a decade ago but states it did not find anything.   Past Medical History:  Diagnosis Date   Allergy    Anxiety    Arthritis    Chronic fatigue syndrome    Depression    Diverticulosis    External hemorrhoids    GERD (gastroesophageal reflux disease)    Hypertension    Internal hemorrhoids    Neuropathy    Spinal stenosis     Prior to Admission medications  Medication Sig Start Date End Date Taking? Authorizing Provider  methocarbamol (ROBAXIN) 500 MG tablet Take 1 tablet (500 mg total) by mouth every 8 (eight) hours as needed. 09/07/24  Yes Patsey Lot, MD  Acetaminophen -Caff-Pyrilamine 500-60-15 MG TABS Take by mouth.    [provider]  ACETAMINOPHEN -CAFFEINE PO Take by mouth in the morning.    [provider]  ARIPiprazole  (ABILIFY ) 2 MG tablet Take 0.5-1 tablets (1-2 mg total) by mouth daily. 09/06/24   Teresa Redell LABOR, NP  clonazePAM  (KLONOPIN ) 0.5 MG tablet Take 1 tablet (0.5 mg total) by mouth daily as needed. 06/20/23 09/04/24  Franchot Harlene SQUIBB, PMHNP  famotidine  (PEPCID ) 20 MG tablet Take 1 tablet (20 mg total) by mouth 2 (two) times daily. 09/03/24   Katrinka Garnette KIDD, MD  FLUoxetine  (PROZAC ) 10 MG capsule Take 3 capsules (30 mg total) by mouth daily. 09/06/24   Teresa Redell LABOR, NP   hydrochlorothiazide  (HYDRODIURIL ) 25 MG tablet Take 1 tablet (25 mg total) by mouth daily. 01/16/24   Katrinka Garnette KIDD, MD  lisdexamfetamine  (VYVANSE ) 10 MG capsule Take 1-2 capsules (10-20 mg total) by mouth daily in the afternoon. 03/13/24   Teresa Redell LABOR, NP  lisdexamfetamine  (VYVANSE ) 20 MG capsule Take 1 capsule (20 mg total) by mouth in the morning. 10/05/23     lisdexamfetamine  (VYVANSE ) 20 MG capsule Take 1 capsule (20 mg total) by mouth daily. 09/06/24   Teresa Redell LABOR, NP  MAGNESIUM CHLORIDE-CALCIUM  PO Take by mouth.    [provider]  Melatonin 3 MG CAPS Take 6 mg by mouth at bedtime.    [provider]  pantoprazole  (PROTONIX ) 20 MG tablet Take 1 tablet (20 mg total) by mouth daily. 01/30/24   Katrinka Garnette KIDD, MD  Probiotic Product (PROBIOTIC DAILY PO) Take by mouth.    [provider]  Semaglutide  (RYBELSUS ) 3 MG TABS 1 tablet Orally once a day; Duration: 30 days    [provider]  Suzetrigine  (JOURNAVX ) 50 MG TABS Take 1 tablet by mouth in the morning and at bedtime. 09/04/24   Claudene Arthea HERO, DO    Allergies: Egg protein-containing drug products    Review of Systems  Updated Vital Signs  BP 128/80 (BP Location: Right Arm)   Pulse 78   Temp 97.8 F (36.6 C) (Oral)   Resp 19   LMP 11/05/2007   SpO2 98%   Physical Exam Vitals and nursing note reviewed.  Pulmonary:     Breath sounds: No wheezing or rhonchi.  Chest:     Chest wall: Tenderness present.  Abdominal:     Tenderness: There is no abdominal tenderness.  Musculoskeletal:     Right lower leg: No edema.     Left lower leg: No edema.  Skin:    Capillary Refill: Capillary refill takes less than 2 seconds.     Findings: No rash.  Neurological:     Mental Status: She is alert and oriented to person, place, and time.     (all labs ordered are listed, but only abnormal results are displayed) Labs Reviewed  BASIC METABOLIC PANEL WITH GFR - Abnormal; Notable for the  following components:      Result Value   Glucose, Bld 115 (*)    All other components within normal limits  CBC - Abnormal; Notable for the following components:   WBC 3.8 (*)    Hemoglobin 10.4 (*)    HCT 30.8 (*)    MCV 78.0 (*)    All other components within normal limits  D-DIMER, QUANTITATIVE  TROPONIN T, HIGH SENSITIVITY  TROPONIN T, HIGH SENSITIVITY    EKG: EKG Interpretation Date/Time:  Friday September 07 2024 06:39:43 EST Ventricular Rate:  79 PR Interval:  182 QRS Duration:  89 QT Interval:  377 QTC Calculation: 433 R Axis:   9  Text Interpretation: Sinus rhythm Confirmed by Patsey Lot [phone]) on 09/07/2024 6:52:08 AM  Radiology: DG Chest 2 View Result Date: 09/07/2024 EXAM: 2 VIEW(S) XRAY OF THE CHEST 09/07/2024 06:58:38 AM COMPARISON: 09/17/2012 CLINICAL HISTORY: Chest pain FINDINGS: LUNGS AND PLEURA: No focal pulmonary opacity. No pleural effusion. No pneumothorax. HEART AND MEDIASTINUM: Tortuous aorta. Overlapping cardiac leads. BONES AND SOFT TISSUES: Degenerative changes in spine. IMPRESSION: 1. No acute process. Electronically signed by: Waddell Calk MD 09/07/2024 07:06 AM EST RP Workstation: [id number]     Procedures   Medications Ordered in the ED  ketorolac (TORADOL) 15 MG/ML injection 15 mg (has no administration in time range)                                    Medical Decision Making Amount and/or Complexity of Data Reviewed Labs: ordered. Radiology: ordered.  Risk Prescription drug management.   Patient with chest pain to left lower chest.  Differential diagnoses longed include causes such as coronary artery disease pneumothorax pneumonia pulmonary embolism.  Will get delta troponins.  Will get D-dimer.  D-dimer negative.  Delta troponin also negative.  Blood work reassuring and x-ray negative.  Reviewed previous cardiac imaging and did have a coronary cardiac score of 0 2 years ago.  I think overall low risk for cardiac cause.   Will treat symptomatically for chest wall pain.  Follow-up with PCP as needed.  May follow-up with cardiology if symptoms do not improve.     Final diagnoses:  Chest wall pain    ED Discharge Orders          Ordered    methocarbamol (ROBAXIN) 500 MG tablet  Every 8 hours PRN        09/07/24 9061  Patsey Lot, MD 09/07/24 [phone]  "

## 2024-09-17 ENCOUNTER — Encounter: Payer: Self-pay | Admitting: Family Medicine

## 2024-09-17 NOTE — Progress Notes (Unsigned)
 " [name] [address] [place] [address] Phone: [phone] Subjective:   ISusannah Gully, am serving as a scribe for Dr. Arthea Claudene.  I'm seeing this patient by the request  of:  Katrinka Garnette KIDD, MD  CC: left hip pain   YEP:Dlagzrupcz  09/04/2024 Still having lumbar radiculopathy.  Discussed multiple different treatment options.  Epidurals have helped with the axial skeletal pain but not to the overall discomfort noted with the radicular symptoms down the leg.  Discussed with patient about icing regimen, we discussed continuing to stay active.  Will give patient a trial of June of asked to help us  delineate how much of this is more peripheral.  Can consider the possibility of piriformis injections and follow-up.  Other ideas would be a nerve conduction study depending on how patient responds.  Follow-up with me again 2 months   I personally spent a total of 31 minutes in the care of the patient today including preparing to see the patient, getting/reviewing separately obtained history, counseling and educating, placing orders, documenting clinical information in the EHR, communicating results, and coordinating care.     Updated 09/18/2024 MECHELE KITTLESON is a 63 y.o. female coming in with complaint of back pain. Having some radiating pain down the left leg. Can't lead with left going up stairs or lift to put on socks. Sitting to standing the worse. Pain is bothersome at night.       Past Medical History:  Diagnosis Date   Allergy    Anxiety    Arthritis    Chronic fatigue syndrome    Depression    Diverticulosis    External hemorrhoids    GERD (gastroesophageal reflux disease)    Hypertension    Internal hemorrhoids    Neuropathy    Spinal stenosis    Past Surgical History:  Procedure Laterality Date   APPENDECTOMY     CARDIAC CATHETERIZATION  09/18/2012   LAPAROSCOPIC APPENDECTOMY N/A 01/22/2023   Procedure: APPENDECTOMY  LAPAROSCOPIC;  Surgeon: Teresa Lonni HERO, MD;  Location: [facility] OR;  Service: General;  Laterality: N/A;   LEFT HEART CATHETERIZATION WITH CORONARY ANGIOGRAM N/A 09/18/2012   Procedure: LEFT HEART CATHETERIZATION WITH CORONARY ANGIOGRAM;  Surgeon: Ozell Fell, MD;  Location: [facility] CATH LAB;  Service: Cardiovascular;  Laterality: N/A;   TONSILLECTOMY  09/21/1963   Social History   Socioeconomic History   Marital status: Married    Spouse name: Not on file   Number of children: 2   Years of education: Not on file   Highest education level: Master's degree (e.g., MA, MS, MEng, MEd, MSW, MBA)  Occupational History   Occupation: Conservation Officer, Historic Buildings: [place]  Tobacco Use   Smoking status: Never   Smokeless tobacco: Never  Substance and Sexual Activity   Alcohol use: No   Drug use: No   Sexual activity: Not Currently    Birth control/protection: Post-menopausal  Other Topics Concern   Not on file  Social History Narrative   Married. 2 adopted children- adopted daughter (21) when single after first marriage from [place]. Son from [place] with 2nd husband(18) in 2021. 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[facility]      Hobbies: animal rescue- has litter of kittens in 2021, walking, time outside   Social Drivers of Health   Tobacco Use: Low Risk (09/07/2024)   Patient History    Smoking Tobacco Use: Never  Smokeless Tobacco Use: Never    Passive Exposure: Not on file  Financial Resource Strain: Low Risk (12/26/2023)   Overall Financial Resource Strain (CARDIA)    Difficulty of Paying Living Expenses: Not hard at all  Food Insecurity: No Food Insecurity (12/26/2023)   Hunger Vital Sign    Worried About Running Out of Food in the Last Year: Never true    Ran Out of Food in the Last Year: Never true  Transportation Needs: No Transportation Needs (12/26/2023)   PRAPARE - Administrator, Civil Service (Medical): No    Lack of Transportation  (Non-Medical): No  Physical Activity: Sufficiently Active (12/26/2023)   Exercise Vital Sign    Days of Exercise per Week: 3 days    Minutes of Exercise per Session: 70 min  Stress: No Stress Concern Present (12/26/2023)   [employer] of Occupational Health - Occupational Stress Questionnaire    Feeling of Stress : Only a little  Social Connections: Moderately Integrated (12/26/2023)   Social Connection and Isolation Panel    Frequency of Communication with Friends and Family: Three times a week    Frequency of Social Gatherings with Friends and Family: Once a week    Attends Religious Services: 1 to 4 times per year    Active Member of [employer] or Organizations: No    Attends Engineer, Structural: Not on file    Marital Status: Married  Depression (PHQ2-9): Low Risk (09/03/2024)   Depression (PHQ2-9)    PHQ-2 Score: 0  Alcohol Screen: Not on file  Housing: Low Risk (12/26/2023)   Housing Stability Vital Sign    Unable to Pay for Housing in the Last Year: No    Number of Times Moved in the Last Year: 0    Homeless in the Last Year: No  Utilities: Not on file  Health Literacy: Not on file   Allergies[1] Family History  Problem Relation Age of Onset   Heart attack Maternal Grandmother 54   Arthritis Maternal Grandmother    Hyperlipidemia Maternal Grandmother    Heart disease Maternal Grandmother    Diabetes Maternal Grandmother    Pancreatic cancer Mother        died 67. 2.5 years past diagnosis.     Anxiety disorder Mother    Depression Mother    Colon polyps Father    Healthy Sister    Healthy Brother    Depression Brother    Heart attack Brother        age 58   Suicidality Maternal Grandfather    Colon cancer Neg Hx    Esophageal cancer Neg Hx    Kidney disease Neg Hx     Current Outpatient Medications (Endocrine & Metabolic):    Semaglutide  (RYBELSUS ) 3 MG TABS, 1 tablet Orally once a day; Duration: 30 days  Current Outpatient Medications (Cardiovascular):     hydrochlorothiazide  (HYDRODIURIL ) 25 MG tablet, Take 1 tablet (25 mg total) by mouth daily.  Current Outpatient Medications (Analgesics):    Acetaminophen -Caff-Pyrilamine 500-60-15 MG TABS, Take by mouth.   ACETAMINOPHEN -CAFFEINE PO, Take by mouth in the morning.   Suzetrigine  (JOURNAVX ) 50 MG TABS, Take 1 tablet by mouth in the morning and at bedtime.  Current Outpatient Medications (Other):    tiZANidine (ZANAFLEX) 4 MG tablet, Take 1 tablet (4 mg total) by mouth at bedtime as needed for muscle spasms.   ARIPiprazole  (ABILIFY ) 2 MG tablet, Take 0.5-1 tablets (1-2 mg total) by mouth daily.   clonazePAM  (KLONOPIN )  0.5 MG tablet, Take 1 tablet (0.5 mg total) by mouth daily as needed.   famotidine  (PEPCID ) 20 MG tablet, Take 1 tablet (20 mg total) by mouth 2 (two) times daily.   FLUoxetine  (PROZAC ) 10 MG capsule, Take 3 capsules (30 mg total) by mouth daily.   lisdexamfetamine  (VYVANSE ) 10 MG capsule, Take 1-2 capsules (10-20 mg total) by mouth daily in the afternoon.   lisdexamfetamine  (VYVANSE ) 20 MG capsule, Take 1 capsule (20 mg total) by mouth in the morning.   lisdexamfetamine  (VYVANSE ) 20 MG capsule, Take 1 capsule (20 mg total) by mouth daily.   MAGNESIUM CHLORIDE-CALCIUM  PO, Take by mouth.   Melatonin 3 MG CAPS, Take 6 mg by mouth at bedtime.   methocarbamol  (ROBAXIN ) 500 MG tablet, Take 1 tablet (500 mg total) by mouth every 8 (eight) hours as needed.   pantoprazole  (PROTONIX ) 20 MG tablet, Take 1 tablet (20 mg total) by mouth daily.   Probiotic Product (PROBIOTIC DAILY PO), Take by mouth.   Reviewed prior external information including notes and imaging from  primary care provider As well as notes that were available from care everywhere and other healthcare systems.  Past medical history, social, surgical and family history all reviewed in electronic medical record.  No pertanent information unless stated regarding to the chief complaint.   Review of Systems:  No headache,  visual changes, nausea, vomiting, diarrhea, constipation, dizziness, abdominal pain, skin rash, fevers, chills, night sweats, weight loss, swollen lymph nodes, body aches, joint swelling, chest pain, shortness of breath, mood changes. POSITIVE muscle aches  Objective  Blood pressure 110/74, pulse 100, height 5' 1 (1.549 m), weight 169 lb (76.7 kg), last menstrual period 11/05/2007, SpO2 97%.   General: No apparent distress alert and oriented x3 mood and affect normal, dressed appropriately.  HEENT: Pupils equal, extraocular movements intact  Respiratory: Patient's speak in full sentences and does not appear short of breath  Cardiovascular: No lower extremity edema, non tender, no erythema  Patient's left leg has some weakness with hip flexion.  Mild positive fulcrum test noted.  Tenderness to palpation over the sacroiliac joint a little.  Mild pain also over the pubic symphysis.    Impression and Recommendations:     The above documentation has been reviewed and is accurate and complete Arthea CHRISTELLA Sharps, DO       [1]  Allergies Allergen Reactions   Egg Protein-Containing Drug Products Other (See Comments)    Makes pt feel like burping up rotten eggs   "

## 2024-09-18 ENCOUNTER — Other Ambulatory Visit (HOSPITAL_COMMUNITY): Payer: Self-pay

## 2024-09-18 ENCOUNTER — Ambulatory Visit (INDEPENDENT_AMBULATORY_CARE_PROVIDER_SITE_OTHER): Admitting: Family Medicine

## 2024-09-18 ENCOUNTER — Encounter: Payer: Self-pay | Admitting: Family Medicine

## 2024-09-18 ENCOUNTER — Ambulatory Visit

## 2024-09-18 ENCOUNTER — Ambulatory Visit
Admission: RE | Admit: 2024-09-18 | Discharge: 2024-09-18 | Disposition: A | Discharge status: Home / Self Care | Source: Ambulatory Visit | Attending: Family Medicine | Admitting: Family Medicine

## 2024-09-18 VITALS — BP 110/74 | HR 100 | Ht 61.0 in | Wt 169.0 lb

## 2024-09-18 DIAGNOSIS — M25552 Pain in left hip: Secondary | ICD-10-CM

## 2024-09-18 DIAGNOSIS — M24552 Contracture, left hip: Secondary | ICD-10-CM

## 2024-09-18 MED ORDER — TIZANIDINE HCL 4 MG PO TABS
4.0000 mg | ORAL_TABLET | Freq: Every evening | ORAL | 0 refills | Status: AC | PRN
  Filled 2024-09-18: qty 30, 30d supply, fill #0

## 2024-09-18 NOTE — Assessment & Plan Note (Signed)
 Has had tightness for previously.  Discussed with patient that I am concerned that there could be a insufficiency fracture of the hip at this time.  Patient's previous MRI did show significant amount of arthritis that was worse than anticipated.  Noted differential includes lumbar radiculopathy but has not responded as well to the epidurals previously.  Will get CT of the hip to further evaluate for an occult fracture.  Depending on findings we will discuss further management.  Patient given a muscle relaxer, Zanaflex to take at night but did declined any other pain medicine.

## 2024-09-18 NOTE — Patient Instructions (Addendum)
 Xray today [facility] [phone] Call Today  When we receive your results we will contact you.  Zanaflex 4mg  See you again in Feb

## 2024-09-19 ENCOUNTER — Ambulatory Visit: Payer: Self-pay | Admitting: Family Medicine

## 2024-09-22 ENCOUNTER — Other Ambulatory Visit (HOSPITAL_COMMUNITY): Payer: Self-pay

## 2024-09-23 ENCOUNTER — Other Ambulatory Visit (HOSPITAL_COMMUNITY): Payer: Self-pay

## 2024-09-24 ENCOUNTER — Other Ambulatory Visit: Payer: Self-pay

## 2024-09-24 ENCOUNTER — Other Ambulatory Visit (HOSPITAL_COMMUNITY): Payer: Self-pay

## 2024-09-25 NOTE — Progress Notes (Signed)
 " [name] [address] [place] [address] Phone: [phone] Subjective:   [name], am serving as a scribe for Dr. Arthea Claudene.  I'm seeing this patient by the request  of:  Katrinka Garnette KIDD, MD  CC: Left hip pain  YEP:Dlagzrupcz  09/18/2024 Has had tightness for previously.  Discussed with patient that I am concerned that there could be a insufficiency fracture of the hip at this time.  Patient's previous MRI did show significant amount of arthritis that was worse than anticipated.  Noted differential includes lumbar radiculopathy but has not responded as well to the epidurals previously.  Will get CT of the hip to further evaluate for an occult fracture.  Depending on findings we will discuss further management.  Patient given a muscle relaxer, Zanaflex  to take at night but did declined any other pain medicine.     Updated 09/27/2024 Tammy [name] is a 64 y.o. female coming in with complaint of L hip pain. Patient states that she is in constant pain. Here for injections today.  Has had this have pain for quite some time.  Affecting daily activities.  Having difficulty even walking greater than 200 feet.  CT scan did not show any significant stress fracture.  Seems to be the underlying arthritis but also having signs of piriformis.       Past Medical History:  Diagnosis Date   Allergy    Anxiety    Arthritis    Chronic fatigue syndrome    Depression    Diverticulosis    External hemorrhoids    GERD (gastroesophageal reflux disease)    Hypertension    Internal hemorrhoids    Neuropathy    Spinal stenosis    Past Surgical History:  Procedure Laterality Date   APPENDECTOMY     CARDIAC CATHETERIZATION  09/18/2012   LAPAROSCOPIC APPENDECTOMY N/A 01/22/2023   Procedure: APPENDECTOMY LAPAROSCOPIC;  Surgeon: Teresa Lonni HERO, MD;  Location: [facility] OR;  Service: General;  Laterality: N/A;   LEFT HEART CATHETERIZATION WITH CORONARY  ANGIOGRAM N/A 09/18/2012   Procedure: LEFT HEART CATHETERIZATION WITH CORONARY ANGIOGRAM;  Surgeon: Ozell Fell, MD;  Location: [facility] CATH LAB;  Service: Cardiovascular;  Laterality: N/A;   TONSILLECTOMY  09/21/1963   Social History   Socioeconomic History   Marital status: Married    Spouse name: Not on file   Number of children: 2   Years of education: Not on file   Highest education level: Master's degree (e.g., MA, MS, MEng, MEd, MSW, MBA)  Occupational History   Occupation: Conservation Officer, Historic Buildings: [employer]  Tobacco Use   Smoking status: Never   Smokeless tobacco: Never  Substance and Sexual Activity   Alcohol use: No   Drug use: No   Sexual activity: Not Currently    Birth control/protection: Post-menopausal  Other Topics Concern   Not on file  Social History Narrative   Married. 2 adopted children- adopted daughter (36) when single after first marriage from [place]. Son from [place] with 2nd husband(18) in 2021. 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[name]      Hobbies: animal rescue- has litter of kittens in 2021, walking, time outside   Social Drivers of Health   Tobacco Use: Low Risk (09/07/2024)   Patient History    Smoking Tobacco Use: Never    Smokeless Tobacco Use: Never    Passive Exposure: Not on file  Financial  Resource Strain: Low Risk (12/26/2023)   Overall Financial Resource Strain (CARDIA)    Difficulty of Paying Living Expenses: Not hard at all  Food Insecurity: No Food Insecurity (12/26/2023)   Hunger Vital Sign    Worried About Running Out of Food in the Last Year: Never true    Ran Out of Food in the Last Year: Never true  Transportation Needs: No Transportation Needs (12/26/2023)   PRAPARE - Administrator, Civil Service (Medical): No    Lack of Transportation (Non-Medical): No  Physical Activity: Sufficiently Active (12/26/2023)   Exercise Vital Sign    Days of Exercise per Week: 3 days    Minutes of Exercise  per Session: 70 min  Stress: No Stress Concern Present (12/26/2023)   [employer] of Occupational Health - Occupational Stress Questionnaire    Feeling of Stress : Only a little  Social Connections: Moderately Integrated (12/26/2023)   Social Connection and Isolation Panel    Frequency of Communication with Friends and Family: Three times a week    Frequency of Social Gatherings with Friends and Family: Once a week    Attends Religious Services: 1 to 4 times per year    Active Member of [employer] or Organizations: No    Attends Engineer, Structural: Not on file    Marital Status: Married  Depression (PHQ2-9): Low Risk (09/03/2024)   Depression (PHQ2-9)    PHQ-2 Score: 0  Alcohol Screen: Not on file  Housing: Low Risk (12/26/2023)   Housing Stability Vital Sign    Unable to Pay for Housing in the Last Year: No    Number of Times Moved in the Last Year: 0    Homeless in the Last Year: No  Utilities: Not on file  Health Literacy: Not on file   Allergies[1] Family History  Problem Relation Age of Onset   Heart attack Maternal Grandmother 28   Arthritis Maternal Grandmother    Hyperlipidemia Maternal Grandmother    Heart disease Maternal Grandmother    Diabetes Maternal Grandmother    Pancreatic cancer Mother        died 74. 2.5 years past diagnosis.     Anxiety disorder Mother    Depression Mother    Colon polyps Father    Healthy Sister    Healthy Brother    Depression Brother    Heart attack Brother        age 26   Suicidality Maternal Grandfather    Colon cancer Neg Hx    Esophageal cancer Neg Hx    Kidney disease Neg Hx     Current Outpatient Medications (Endocrine & Metabolic):    Semaglutide  (RYBELSUS ) 3 MG TABS, 1 tablet Orally once a day; Duration: 30 days  Current Outpatient Medications (Cardiovascular):    hydrochlorothiazide  (HYDRODIURIL ) 25 MG tablet, Take 1 tablet (25 mg total) by mouth daily.  Current Outpatient Medications (Analgesics):     Acetaminophen -Caff-Pyrilamine 500-60-15 MG TABS, Take by mouth.   ACETAMINOPHEN -CAFFEINE PO, Take by mouth in the morning.   Suzetrigine  (JOURNAVX ) 50 MG TABS, Take 1 tablet by mouth in the morning and at bedtime.  Current Outpatient Medications (Other):    ARIPiprazole  (ABILIFY ) 2 MG tablet, Take 0.5-1 tablets (1-2 mg total) by mouth daily.   clonazePAM  (KLONOPIN ) 0.5 MG tablet, Take 1 tablet (0.5 mg total) by mouth daily as needed.   famotidine  (PEPCID ) 20 MG tablet, Take 1 tablet (20 mg total) by mouth 2 (two) times daily.   FLUoxetine  (  PROZAC ) 10 MG capsule, Take 3 capsules (30 mg total) by mouth daily.   lisdexamfetamine  (VYVANSE ) 10 MG capsule, Take 1-2 capsules (10-20 mg total) by mouth daily in the afternoon.   lisdexamfetamine  (VYVANSE ) 20 MG capsule, Take 1 capsule (20 mg total) by mouth in the morning.   lisdexamfetamine  (VYVANSE ) 20 MG capsule, Take 1 capsule (20 mg total) by mouth daily.   MAGNESIUM CHLORIDE-CALCIUM  PO, Take by mouth.   Melatonin 3 MG CAPS, Take 6 mg by mouth at bedtime.   methocarbamol  (ROBAXIN ) 500 MG tablet, Take 1 tablet (500 mg total) by mouth every 8 (eight) hours as needed.   pantoprazole  (PROTONIX ) 20 MG tablet, Take 1 tablet (20 mg total) by mouth daily.   Probiotic Product (PROBIOTIC DAILY PO), Take by mouth.   tiZANidine  (ZANAFLEX ) 4 MG tablet, Take 1 tablet (4 mg total) by mouth at bedtime as needed for muscle spasms.   Reviewed prior external information including notes and imaging from  primary care provider As well as notes that were available from care everywhere and other healthcare systems.  Past medical history, social, surgical and family history all reviewed in electronic medical record.  No pertanent information unless stated regarding to the chief complaint.   Review of Systems:  No headache, visual changes, nausea, vomiting, diarrhea, constipation, dizziness, abdominal pain, skin rash, fevers, chills, night sweats, weight loss, swollen  lymph nodes, body aches, joint swelling, chest pain, shortness of breath, mood changes. POSITIVE muscle aches  Objective  Blood pressure 98/66, pulse 94, height 5' 1 (1.549 m), weight 166 lb (75.3 kg), last menstrual period 11/05/2007, SpO2 100%.   General: No apparent distress alert and oriented x3 mood and affect normal, dressed appropriately.  HEENT: Pupils equal, extraocular movements intact  Respiratory: Patient's speak in full sentences and does not appear short of breath  Cardiovascular: No lower extremity edema, non tender, no erythema  Severely antalgic gait noted.  Some limited range of motion especially with internal and external range of motion.  Procedure: Real-time Ultrasound Guided Injection of left intra-articular hip Device: GE Logiq Q7  Ultrasound guided injection is preferred based studies that show increased duration, increased effect, greater accuracy, decreased procedural pain, increased response rate with ultrasound guided versus blind injection.  Verbal informed consent obtained.  Time-out conducted.  Noted no overlying erythema, induration, or other signs of local infection.  Skin prepped in a sterile fashion.  Local anesthesia: Topical Ethyl chloride.  With sterile technique and under real time ultrasound guidance:  Anterior capsule visualized, needle visualized going to the head neck junction at the anterior capsule. Pictures taken. Patient did have injection of 3 cc of 0.5% Marcaine , and 1 cc of Kenalog  40 mg/dL. Completed without difficulty  Pain immediately resolved suggesting accurate placement of the medication.  Advised to call if fevers/chills, erythema, induration, drainage, or persistent bleeding.  Images permanently stored  Impression: Technically successful ultrasound guided injection.  Procedure: Real-time Ultrasound Guided Injection of left piriformis tendon sheath Device: GE Logiq Q7 Ultrasound guided injection is preferred based studies that show  increased duration, increased effect, greater accuracy, decreased procedural pain, increased response rate, and decreased cost with ultrasound guided versus blind injection.  Verbal informed consent obtained.  Time-out conducted.  Noted no overlying erythema, induration, or other signs of local infection.  Skin prepped in a sterile fashion.  Local anesthesia: Topical Ethyl chloride.  With sterile technique and under real time ultrasound guidance: With a 21-gauge 2 inch needle injected with cc of 0.5%  Marcaine  and 1 cc of Kenalog  40 mg/mL. Completed without difficulty  Pain immediately resolved suggesting accurate placement of the medication.  Advised to call if fevers/chills, erythema, induration, drainage, or persistent bleeding.  Impression: Technically successful ultrasound guided injection.   Impression and Recommendations:     The above documentation has been reviewed and is accurate and complete Arthea CHRISTELLA Sharps, DO       [1]  Allergies Allergen Reactions   Egg Protein-Containing Drug Products Other (See Comments)    Makes pt feel like burping up rotten eggs   "

## 2024-09-27 ENCOUNTER — Other Ambulatory Visit: Payer: Self-pay

## 2024-09-27 ENCOUNTER — Encounter: Payer: Self-pay | Admitting: Family Medicine

## 2024-09-27 ENCOUNTER — Ambulatory Visit (INDEPENDENT_AMBULATORY_CARE_PROVIDER_SITE_OTHER): Admitting: Family Medicine

## 2024-09-27 VITALS — BP 98/66 | HR 94 | Ht 61.0 in | Wt 166.0 lb

## 2024-09-27 DIAGNOSIS — M1612 Unilateral primary osteoarthritis, left hip: Secondary | ICD-10-CM

## 2024-09-27 DIAGNOSIS — M25552 Pain in left hip: Secondary | ICD-10-CM

## 2024-09-27 DIAGNOSIS — G5702 Lesion of sciatic nerve, left lower limb: Secondary | ICD-10-CM | POA: Diagnosis not present

## 2024-09-27 NOTE — Patient Instructions (Addendum)
 2 injections today Ok to start PT in 3-4 weeks See me again in 2-3 months

## 2024-09-27 NOTE — Assessment & Plan Note (Signed)
 Patient given injection and tolerated the procedure well, discussed icing regimen and home exercises, discussed which activities to do and which still feel that the hip arthritis is likely contributing.  Follow-up again in 6 to 12 weeks restart physical therapy in 3 weeks

## 2024-09-27 NOTE — Assessment & Plan Note (Signed)
 Given injection today, tolerated the procedure well, discussed icing regimen and home exercises, patient will start physical therapy for this as well as patient's underlying piriformis syndrome.  Hopeful that this will make him difference.  Unfortunately I do think patient will need a hip replacement at some point in the future but hopefully not anytime soon.  Follow-up again in 6 to 12 weeks

## 2024-09-28 ENCOUNTER — Other Ambulatory Visit: Payer: Self-pay

## 2024-10-08 ENCOUNTER — Other Ambulatory Visit (HOSPITAL_COMMUNITY): Payer: Self-pay

## 2024-10-18 ENCOUNTER — Telehealth: Payer: Self-pay | Admitting: Family Medicine

## 2024-10-18 ENCOUNTER — Other Ambulatory Visit: Payer: Self-pay

## 2024-10-18 DIAGNOSIS — M5416 Radiculopathy, lumbar region: Secondary | ICD-10-CM

## 2024-10-18 DIAGNOSIS — G5702 Lesion of sciatic nerve, left lower limb: Secondary | ICD-10-CM

## 2024-10-18 DIAGNOSIS — M1612 Unilateral primary osteoarthritis, left hip: Secondary | ICD-10-CM

## 2024-10-18 DIAGNOSIS — M7062 Trochanteric bursitis, left hip: Secondary | ICD-10-CM

## 2024-10-18 NOTE — Telephone Encounter (Signed)
 Patient is scheduled for PT beginning 1/30. Patients last referral was sent in May 2025 and was last seen in October 2025 for PT so she will need a new referral sent prior to her appointment per Elaine. Thank you!

## 2024-10-18 NOTE — Telephone Encounter (Signed)
 Referral placed

## 2024-10-18 NOTE — Therapy (Signed)
 " OUTPATIENT PHYSICAL THERAPY EVALUATION   Patient Name: [name] MRN: [id number] DOB:Mar 28, 1961, 64 y.o., female Today's Date: 10/19/2024   END OF SESSION:  PT End of Session - 10/19/24 0756     Visit Number 1    Number of Visits 16    Date for Recertification  12/14/24    Authorization Type [facility] [employer]    PT Start Time 0800    PT Stop Time 0845    PT Time Calculation (min) 45 min    Activity Tolerance Patient tolerated treatment well    Behavior During Therapy WFL for tasks assessed/performed          Past Medical History:  Diagnosis Date   Allergy    Anxiety    Arthritis    Chronic fatigue syndrome    Depression    Diverticulosis    External hemorrhoids    GERD (gastroesophageal reflux disease)    Hypertension    Internal hemorrhoids    Neuropathy    Spinal stenosis    Past Surgical History:  Procedure Laterality Date   APPENDECTOMY     CARDIAC CATHETERIZATION  09/18/2012   LAPAROSCOPIC APPENDECTOMY N/A 01/22/2023   Procedure: APPENDECTOMY LAPAROSCOPIC;  Surgeon: Teresa Lonni HERO, MD;  Location: [facility] OR;  Service: General;  Laterality: N/A;   LEFT HEART CATHETERIZATION WITH CORONARY ANGIOGRAM N/A 09/18/2012   Procedure: LEFT HEART CATHETERIZATION WITH CORONARY ANGIOGRAM;  Surgeon: Ozell Fell, MD;  Location: [facility] CATH LAB;  Service: Cardiovascular;  Laterality: N/A;   TONSILLECTOMY  09/21/1963   Patient Active Problem List   Diagnosis Date Noted   Piriformis syndrome of left side 09/27/2024   Chronic left-sided lumbar radiculopathy 09/04/2024   Prediabetes 12/28/2023   S/P laparoscopic appendectomy 01/22/2023   Unilateral primary osteoarthritis, left hip 06/25/2022   Hip flexor tendon tightness, left 04/02/2022   Greater trochanteric bursitis of left hip 01/11/2022   Anxiety 07/24/2020   Nonallopathic lesion of cervical region 04/03/2020   Nonallopathic lesion of thoracic region 04/03/2020   Nonallopathic lesion of lumbar region 04/03/2020    Cervical stenosis of spine 03/31/2020   Attention deficit disorder (ADD) in adult 03/31/2020   Low bone density 03/17/2020   Major depressive disorder in full remission 02/15/2020   GERD (gastroesophageal reflux disease) 02/15/2020   Allergic rhinitis 03/09/2016   Essential hypertension 02/18/2014   Chronic pain syndrome 02/22/2013   Chronic insomnia 02/22/2013   Paresthesias 10/25/2012   Hyperlipidemia 09/18/2012    PCP: Katrinka Garnette KIDD, MD   REFERRING PROVIDER: Claudene Arthea HERO, DO  REFERRING DIAG: Chronic left-sided lumbar radiculopathy; Piriformis syndrome of left side; Greater trochanteric bursitis of left hip; Unilateral primary osteoarthritis, left hip  Rationale for Evaluation and Treatment: Rehabilitation  THERAPY DIAG:  Pain in left hip  Muscle weakness (generalized)  ONSET DATE: Chronic   SUBJECTIVE SUBJECTIVE STATEMENT: Patient reports over the holidays her left hip and leg got really bad, like the worst that its been. She couldn't lift her leg and even when he used her arms to lift the leg it still hurt like crazy. She did have two injections, one in the hip and one in the piriformis, which helped a lot for about a week. She states currently the left hip has still been acting up on her. The pain hurts deep within the hip as well as the outside of the hip with tenderness, and the pain travels down the outside of her leg and into the foot at times. She states she is unable  to lie on either side due to left hip pain, so she has to lie on her back with sleeping.   PERTINENT HISTORY:  See PMH above  PAIN:  Are you having pain? Yes:  NPRS scale: 1/10 currently at rest, 5-6/10 with activity Pain location: Left hip Pain description: Achy, throb Aggravating factors: Activity, raising left leg, lying on left side Relieving factors: Rest  PRECAUTIONS: None  RED FLAGS: None   WEIGHT BEARING RESTRICTIONS: No  FALLS:  Has patient fallen in last 6 months?  No  PLOF: Independent  PATIENT GOALS: Pain relief, return to walking and stairs without limitation   OBJECTIVE:  Note: Objective measures were completed at Evaluation unless otherwise noted. PATIENT SURVEYS:  PSFS: 5 Walking: 6 Stairs: 4 Kneeling: 4 Swim without pain: 6  COGNITION: Overall cognitive status: Within functional limits for tasks assessed     SENSATION: WFL  MUSCLE LENGTH: Hamstring length grossly WFL  POSTURE:   Grossly WFL  PALPATION: Exquisite tenderness to palpation of left greater trochanter, glute med and max, piriformis region  LUMBAR ROM:   AROM eval  Flexion WFL  Extension WFL  Right lateral flexion WFL  Left lateral flexion WFL  Right rotation WFL  Left rotation WFL   (Blank rows = not tested)  LOWER EXTREMITY ROM:    Left hip PROM grossly WFL but she does report hip pain using C-sign with end range hip flexion and IR  LOWER EXTREMITY MMT:    MMT Right eval Left eval  Hip flexion 4 4-  Hip extension 4 4-  Hip abduction 4- 3  Hip adduction    Hip internal rotation    Hip external rotation    Knee flexion 5 5  Knee extension 5 4+  Ankle dorsiflexion    Ankle plantarflexion    Ankle inversion    Ankle eversion     (Blank rows = not tested)  FUNCTIONAL TESTS:  Not formally assessed at eval  GAIT: Assistive device utilized: None Level of assistance: Complete Independence Comments: Trendelenburg greater on left   TREATMENT [facility] Adult PT Treatment:                                                DATE: 10/20/2023 Left hip distraction LAD mobilizations, inferior glide mobilizations with hip at 90 deg flexion, manual inferior glide combined with hip flexion  Supine self inferior glide of left hip with hip flexion using black band x 10 Side clamshell x 10 Side reverse clamshell x 10 Bridge 5 x 5 sec  PATIENT EDUCATION:  Education details: Exam findings, POC, HEP Person educated: Patient Education method: Programmer, Multimedia,  Demonstration, Tactile cues, Verbal cues, and Handouts Education comprehension: verbalized understanding, returned demonstration, verbal cues required, tactile cues required, and needs further education  HOME EXERCISE PROGRAM: Access Code: [email or web address]    ASSESSMENT: CLINICAL IMPRESSION: Patient is a 64 y.o. female who was seen today for physical therapy evaluation and treatment for chronic left hip and leg pain. He symptoms seem multifactorial consisting of left hip joint related pain and greater trochanteric pain syndrome. She does report C-sign with her pain that is reproduced primarily with hip movement to end ranges. She exhibits gross strength deficit of the left hip and exquisite tenderness at the left greater trochanter, glute med/max and piriformis region. She did report improvement in pain and mobility of  left hip with active standing hip flexion following joint mobs and exercise.  OBJECTIVE IMPAIRMENTS: Abnormal gait, decreased activity tolerance, decreased strength, and pain.   ACTIVITY LIMITATIONS: carrying, lifting, bending, sitting, standing, squatting, sleeping, stairs, dressing, and locomotion level  PARTICIPATION LIMITATIONS: meal prep, cleaning, driving, shopping, and community activity  PERSONAL FACTORS: Fitness and Past/current experiences are also affecting patient's functional outcome.   REHAB POTENTIAL: Good  CLINICAL DECISION MAKING: Stable/uncomplicated  EVALUATION COMPLEXITY: Low   GOALS: Goals reviewed with patient? Yes  SHORT TERM GOALS: Target date: 11/16/2024  Patient will be I with initial HEP in order to progress with therapy. Baseline: HEP provided at eval Goal status: INITIAL  2.  Patient will report left hip pain </= 2/10 with activity in order to reduce functional limitations  Baseline: 5-6/10 pain Goal status: INITIAL  LONG TERM GOALS: Target date: 12/14/2024  Patient will be I with final HEP to maintain progress from PT. Baseline: HEP  provided at eval Goal status: INITIAL  2.  Patient will report PSFS >/= 8 in order to indicate improvement in their functional ability. Baseline: 5 Goal status: INITIAL  3.  Patient will demonstrate hip strength >/= 4+/5 MMT in order to improve her walking tolerance and stair negotiation  Baseline: see limitations noted above Goal status: INITIAL  4.  Patient will be able to perform standing active left hip flexion without pain or limitation in order to improve dressing and self care ability Baseline: patient reports pain and difficulty lifting Goal status: INITIAL   PLAN: PT FREQUENCY: 1-2x/week  PT DURATION: 8 weeks  PLANNED INTERVENTIONS: 97164- PT Re-evaluation, 97750- Physical Performance Testing, 97110-Therapeutic exercises, 97530- Therapeutic activity, 97112- Neuromuscular re-education, 97535- Self Care, 02859- Manual therapy, 97033- Ionotophoresis 4mg /ml Dexamethasone , 79439 (1-2 muscles), 20561 (3+ muscles)- Dry Needling, Patient/Family education, Balance training, Taping, Joint mobilization, Joint manipulation, Spinal manipulation, Spinal mobilization, Cryotherapy, and Moist heat.  PLAN FOR NEXT SESSION: Review HEP and progress PRN, mobilizations for left hip, manual/TPDN for left gluteal and piriformis region, progress hip and core strengthening/stabilization as tolerated   Elaine Daring, PT, DPT, LAT, ATC 10/19/24  10:47 AM Phone: [phone] Fax: [phone]   "

## 2024-10-19 ENCOUNTER — Ambulatory Visit (INDEPENDENT_AMBULATORY_CARE_PROVIDER_SITE_OTHER): Admitting: Physical Therapy

## 2024-10-19 ENCOUNTER — Other Ambulatory Visit: Payer: Self-pay

## 2024-10-19 ENCOUNTER — Encounter: Payer: Self-pay | Admitting: Physical Therapy

## 2024-10-19 DIAGNOSIS — M6281 Muscle weakness (generalized): Secondary | ICD-10-CM

## 2024-10-19 DIAGNOSIS — M25552 Pain in left hip: Secondary | ICD-10-CM

## 2024-10-19 NOTE — Patient Instructions (Signed)
 Access Code: [email or web address] URL: [email or web address] Date: 10/19/2024 Prepared by: Elaine Daring  Exercises - Clamshell  - 1 x daily - 2 sets - 10 reps - Sidelying Reverse Clamshell  - 1 x daily - 2 sets - 10 reps - Bridge  - 1 x daily - 3 sets - 5 reps - 5 seconds hold - Supine Hip Inferior Glide with Flexion  - 1 x daily - 10 reps - 3 seconds hold

## 2024-10-23 ENCOUNTER — Encounter: Admitting: Physical Therapy

## 2024-10-24 ENCOUNTER — Ambulatory Visit: Admitting: Family Medicine

## 2024-10-26 ENCOUNTER — Ambulatory Visit: Admitting: Family Medicine

## 2024-10-26 ENCOUNTER — Encounter: Admitting: Physical Therapy

## 2024-10-29 ENCOUNTER — Ambulatory Visit: Admitting: Family Medicine

## 2024-10-30 ENCOUNTER — Encounter: Admitting: Physical Therapy

## 2024-11-02 ENCOUNTER — Encounter: Admitting: Physical Therapy

## 2024-11-06 ENCOUNTER — Encounter: Admitting: Physical Therapy

## 2024-11-09 ENCOUNTER — Encounter: Admitting: Physical Therapy

## 2024-12-06 ENCOUNTER — Ambulatory Visit: Admitting: Behavioral Health

## 2024-12-10 ENCOUNTER — Ambulatory Visit: Admitting: Family Medicine

## 2025-03-04 ENCOUNTER — Encounter: Admitting: Family Medicine

## 2025-03-20 ENCOUNTER — Encounter: Admitting: Family Medicine
